# Patient Record
Sex: Male | Born: 1950
Health system: Southern US, Community
[De-identification: ages and names within clinical notes are randomized; demographics above are authoritative.]

## PROBLEM LIST (undated history)

## (undated) DIAGNOSIS — R569 Unspecified convulsions: Secondary | ICD-10-CM

## (undated) DIAGNOSIS — R55 Syncope and collapse: Secondary | ICD-10-CM

## (undated) DIAGNOSIS — I519 Heart disease, unspecified: Secondary | ICD-10-CM

## (undated) DIAGNOSIS — M21379 Foot drop, unspecified foot: Secondary | ICD-10-CM

## (undated) DIAGNOSIS — E785 Hyperlipidemia, unspecified: Secondary | ICD-10-CM

## (undated) DIAGNOSIS — S129XXA Fracture of neck, unspecified, initial encounter: Secondary | ICD-10-CM

## (undated) DIAGNOSIS — K759 Inflammatory liver disease, unspecified: Secondary | ICD-10-CM

## (undated) DIAGNOSIS — E119 Type 2 diabetes mellitus without complications: Secondary | ICD-10-CM

## (undated) DIAGNOSIS — R42 Dizziness and giddiness: Secondary | ICD-10-CM

## (undated) DIAGNOSIS — G4730 Sleep apnea, unspecified: Secondary | ICD-10-CM

## (undated) DIAGNOSIS — I219 Acute myocardial infarction, unspecified: Secondary | ICD-10-CM

## (undated) DIAGNOSIS — I10 Essential (primary) hypertension: Secondary | ICD-10-CM

## (undated) DIAGNOSIS — I2510 Atherosclerotic heart disease of native coronary artery without angina pectoris: Secondary | ICD-10-CM

## (undated) DIAGNOSIS — I6522 Occlusion and stenosis of left carotid artery: Secondary | ICD-10-CM

## (undated) DIAGNOSIS — A879 Viral meningitis, unspecified: Secondary | ICD-10-CM

## (undated) HISTORY — DX: Essential (primary) hypertension: I10

## (undated) HISTORY — DX: Heart disease, unspecified: I51.9

## (undated) HISTORY — DX: Inflammatory liver disease, unspecified: K75.9

## (undated) HISTORY — DX: Acute myocardial infarction, unspecified: I21.9

## (undated) HISTORY — PX: CARDIAC SURGERY: SHX584

## (undated) HISTORY — PX: CORONARY ANGIOPLASTY: SHX604

## (undated) HISTORY — PX: COLONOSCOPY: SHX174

---

## 1983-05-11 HISTORY — PX: HERNIA REPAIR: SHX51

## 2004-10-05 ENCOUNTER — Emergency Department: Payer: Self-pay | Admitting: Emergency Medicine

## 2004-10-09 ENCOUNTER — Emergency Department: Payer: Self-pay | Admitting: Emergency Medicine

## 2004-10-09 ENCOUNTER — Ambulatory Visit: Payer: Self-pay | Admitting: Unknown Physician Specialty

## 2004-10-28 ENCOUNTER — Encounter: Payer: Self-pay | Admitting: Physical Medicine and Rehabilitation

## 2004-11-07 ENCOUNTER — Encounter: Payer: Self-pay | Admitting: Physical Medicine and Rehabilitation

## 2004-12-08 ENCOUNTER — Encounter: Payer: Self-pay | Admitting: Physical Medicine and Rehabilitation

## 2005-01-08 ENCOUNTER — Encounter: Payer: Self-pay | Admitting: Physical Medicine and Rehabilitation

## 2005-02-07 ENCOUNTER — Encounter: Payer: Self-pay | Admitting: Physical Medicine and Rehabilitation

## 2006-05-10 DIAGNOSIS — S129XXA Fracture of neck, unspecified, initial encounter: Secondary | ICD-10-CM

## 2006-05-10 HISTORY — DX: Fracture of neck, unspecified, initial encounter: S12.9XXA

## 2011-05-09 ENCOUNTER — Emergency Department: Payer: Self-pay | Admitting: Emergency Medicine

## 2013-05-10 DIAGNOSIS — I219 Acute myocardial infarction, unspecified: Secondary | ICD-10-CM

## 2013-05-10 HISTORY — DX: Acute myocardial infarction, unspecified: I21.9

## 2013-05-10 HISTORY — PX: CORONARY ARTERY BYPASS GRAFT: SHX141

## 2013-08-18 LAB — CBC
HCT: 42.6 % (ref 40.0–52.0)
HGB: 14.2 g/dL (ref 13.0–18.0)
MCH: 29.7 pg (ref 26.0–34.0)
MCHC: 33.3 g/dL (ref 32.0–36.0)
MCV: 89 fL (ref 80–100)
Platelet: 175 10*3/uL (ref 150–440)
RBC: 4.78 10*6/uL (ref 4.40–5.90)
RDW: 13.9 % (ref 11.5–14.5)
WBC: 10.4 10*3/uL (ref 3.8–10.6)

## 2013-08-18 LAB — BASIC METABOLIC PANEL
Anion Gap: 7 (ref 7–16)
BUN: 14 mg/dL (ref 7–18)
CHLORIDE: 100 mmol/L (ref 98–107)
CO2: 26 mmol/L (ref 21–32)
Calcium, Total: 8.7 mg/dL (ref 8.5–10.1)
Creatinine: 0.92 mg/dL (ref 0.60–1.30)
EGFR (African American): >60
GLUCOSE: 118 mg/dL — AB (ref 65–99)
OSMOLALITY: 268 (ref 275–301)
Potassium: 3.6 mmol/L (ref 3.5–5.1)
Sodium: 133 mmol/L — ABNORMAL LOW (ref 136–145)

## 2013-08-18 LAB — PROTIME-INR
INR: 1.1
Prothrombin Time: 14.4 secs (ref 11.5–14.7)

## 2013-08-18 LAB — TROPONIN I: Troponin-I: 0.08 ng/mL — ABNORMAL HIGH

## 2013-08-18 LAB — PRO B NATRIURETIC PEPTIDE: B-Type Natriuretic Peptide: 27 pg/mL (ref 0–125)

## 2013-08-19 ENCOUNTER — Inpatient Hospital Stay: Payer: Self-pay | Admitting: Student

## 2013-08-19 LAB — CK TOTAL AND CKMB (NOT AT ARMC)
CK, TOTAL: 194 U/L
CK, Total: 207 U/L
CK, Total: 191 U/L
CK-MB: 11.2 ng/mL — ABNORMAL HIGH (ref 0.5–3.6)
CK-MB: 10.4 ng/mL — ABNORMAL HIGH (ref 0.5–3.6)
CK-MB: 9.8 ng/mL — ABNORMAL HIGH (ref 0.5–3.6)

## 2013-08-19 LAB — TROPONIN I
TROPONIN-I: 0.78 ng/mL — AB
TROPONIN-I: 1.5 ng/mL — AB
Troponin-I: 1.6 ng/mL — ABNORMAL HIGH
Troponin-I: 1.5 ng/mL — ABNORMAL HIGH
Troponin-I: 1.4 ng/mL — ABNORMAL HIGH

## 2013-08-19 LAB — PLATELET COUNT: Platelet: 162 10*3/uL (ref 150–440)

## 2013-08-19 LAB — APTT
ACTIVATED PTT: 77 s — AB (ref 23.6–35.9)
Activated PTT: 58.7 secs — ABNORMAL HIGH (ref 23.6–35.9)
Activated PTT: 62.8 secs — ABNORMAL HIGH (ref 23.6–35.9)

## 2013-08-19 LAB — CK-MB
CK-MB: 5.7 ng/mL — ABNORMAL HIGH (ref 0.5–3.6)
CK-MB: 8.9 ng/mL — ABNORMAL HIGH (ref 0.5–3.6)
CK-MB: 11 ng/mL — AB (ref 0.5–3.6)

## 2013-08-20 DIAGNOSIS — I252 Old myocardial infarction: Secondary | ICD-10-CM | POA: Insufficient documentation

## 2013-08-20 LAB — LIPID PANEL
CHOLESTEROL: 168 mg/dL (ref 0–200)
HDL Cholesterol: 46 mg/dL (ref 40–60)
Ldl Cholesterol, Calc: 102 mg/dL — ABNORMAL HIGH (ref 0–100)
Triglycerides: 102 mg/dL (ref 0–200)
VLDL CHOLESTEROL, CALC: 20 mg/dL (ref 5–40)

## 2013-08-20 LAB — CBC WITH DIFFERENTIAL/PLATELET
BASOS ABS: 0 10*3/uL (ref 0.0–0.1)
BASOS PCT: 0.2 %
EOS PCT: 4.8 %
Eosinophil #: 0.4 10*3/uL (ref 0.0–0.7)
HCT: 39.1 % — ABNORMAL LOW (ref 40.0–52.0)
HGB: 13.4 g/dL (ref 13.0–18.0)
Lymphocyte #: 2.3 10*3/uL (ref 1.0–3.6)
Lymphocyte %: 28.9 %
MCH: 30.7 pg (ref 26.0–34.0)
MCHC: 34.4 g/dL (ref 32.0–36.0)
MCV: 89 fL (ref 80–100)
MONO ABS: 0.7 x10 3/mm (ref 0.2–1.0)
Monocyte %: 9 %
Neutrophil #: 4.6 10*3/uL (ref 1.4–6.5)
Neutrophil %: 57.1 %
Platelet: 148 10*3/uL — ABNORMAL LOW (ref 150–440)
RBC: 4.39 10*6/uL — ABNORMAL LOW (ref 4.40–5.90)
RDW: 14.3 % (ref 11.5–14.5)
WBC: 8.1 10*3/uL (ref 3.8–10.6)

## 2013-08-20 LAB — BASIC METABOLIC PANEL
Anion Gap: 6 — ABNORMAL LOW (ref 7–16)
BUN: 19 mg/dL — AB (ref 7–18)
Calcium, Total: 8.5 mg/dL (ref 8.5–10.1)
Chloride: 104 mmol/L (ref 98–107)
Co2: 26 mmol/L (ref 21–32)
Creatinine: 0.86 mg/dL (ref 0.60–1.30)
EGFR (African American): >60
Glucose: 112 mg/dL — ABNORMAL HIGH (ref 65–99)
OSMOLALITY: 275 (ref 275–301)
Potassium: 4.1 mmol/L (ref 3.5–5.1)
Sodium: 136 mmol/L (ref 136–145)

## 2013-08-20 LAB — APTT: Activated PTT: 62.2 secs — ABNORMAL HIGH (ref 23.6–35.9)

## 2013-08-21 LAB — BASIC METABOLIC PANEL
Anion Gap: 5 — ABNORMAL LOW (ref 7–16)
BUN: 14 mg/dL (ref 7–18)
CALCIUM: 8.2 mg/dL — AB (ref 8.5–10.1)
CO2: 27 mmol/L (ref 21–32)
Chloride: 103 mmol/L (ref 98–107)
Creatinine: 0.92 mg/dL (ref 0.60–1.30)
EGFR (African American): >60
Glucose: 118 mg/dL — ABNORMAL HIGH (ref 65–99)
Osmolality: 272 (ref 275–301)
Potassium: 3.8 mmol/L (ref 3.5–5.1)
Sodium: 135 mmol/L — ABNORMAL LOW (ref 136–145)

## 2013-08-21 LAB — CK TOTAL AND CKMB (NOT AT ARMC)
CK, TOTAL: 122 U/L
CK-MB: 3 ng/mL (ref 0.5–3.6)

## 2013-09-10 DIAGNOSIS — Z9861 Coronary angioplasty status: Secondary | ICD-10-CM | POA: Insufficient documentation

## 2013-09-10 DIAGNOSIS — Z955 Presence of coronary angioplasty implant and graft: Secondary | ICD-10-CM | POA: Insufficient documentation

## 2013-09-22 ENCOUNTER — Observation Stay: Payer: Self-pay | Admitting: Internal Medicine

## 2013-09-22 LAB — CBC
HCT: 41 % (ref 40.0–52.0)
HGB: 13.7 g/dL (ref 13.0–18.0)
MCH: 30.5 pg (ref 26.0–34.0)
MCHC: 33.5 g/dL (ref 32.0–36.0)
MCV: 91 fL (ref 80–100)
PLATELETS: 146 10*3/uL — AB (ref 150–440)
RBC: 4.5 10*6/uL (ref 4.40–5.90)
RDW: 14.5 % (ref 11.5–14.5)
WBC: 7 10*3/uL (ref 3.8–10.6)

## 2013-09-22 LAB — COMPREHENSIVE METABOLIC PANEL
ALBUMIN: 3.6 g/dL (ref 3.4–5.0)
ANION GAP: 8 (ref 7–16)
AST: 29 U/L (ref 15–37)
Alkaline Phosphatase: 101 U/L
BUN: 16 mg/dL (ref 7–18)
Bilirubin,Total: 0.2 mg/dL (ref 0.2–1.0)
CHLORIDE: 106 mmol/L (ref 98–107)
Calcium, Total: 8.5 mg/dL (ref 8.5–10.1)
Co2: 25 mmol/L (ref 21–32)
Creatinine: 0.91 mg/dL (ref 0.60–1.30)
EGFR (Non-African Amer.): >60
GLUCOSE: 162 mg/dL — AB (ref 65–99)
Osmolality: 282 (ref 275–301)
Potassium: 3.8 mmol/L (ref 3.5–5.1)
SGPT (ALT): 32 U/L (ref 12–78)
SODIUM: 139 mmol/L (ref 136–145)
Total Protein: 7.3 g/dL (ref 6.4–8.2)

## 2013-09-22 LAB — TROPONIN I
TROPONIN-I: 0.02 ng/mL
Troponin-I: 0.07 ng/mL — ABNORMAL HIGH

## 2013-09-22 LAB — CK-MB
CK-MB: 3.9 ng/mL — ABNORMAL HIGH (ref 0.5–3.6)
CK-MB: 3.5 ng/mL (ref 0.5–3.6)

## 2013-09-22 LAB — PRO B NATRIURETIC PEPTIDE: B-TYPE NATIURETIC PEPTID: 91 pg/mL (ref 0–125)

## 2013-09-22 LAB — PROTIME-INR
INR: 1.1
Prothrombin Time: 14.3 secs (ref 11.5–14.7)

## 2013-09-22 LAB — PHENYTOIN LEVEL, TOTAL: DILANTIN: 17.4 ug/mL (ref 10.0–20.0)

## 2013-09-23 LAB — CK-MB
CK-MB: 3.2 ng/mL (ref 0.5–3.6)
CK-MB: 3.3 ng/mL (ref 0.5–3.6)
CK-MB: 3.3 ng/mL (ref 0.5–3.6)

## 2013-09-23 LAB — TROPONIN I
TROPONIN-I: 0.22 ng/mL — AB
Troponin-I: 0.2 ng/mL — ABNORMAL HIGH
Troponin-I: 0.3 ng/mL — ABNORMAL HIGH

## 2013-09-23 LAB — LIPID PANEL
Cholesterol: 117 mg/dL (ref 0–200)
HDL: 49 mg/dL (ref 40–60)
LDL CHOLESTEROL, CALC: 55 mg/dL (ref 0–100)
Triglycerides: 64 mg/dL (ref 0–200)
VLDL CHOLESTEROL, CALC: 13 mg/dL (ref 5–40)

## 2013-11-07 ENCOUNTER — Telehealth: Payer: Self-pay | Admitting: Cardiovascular Disease

## 2013-11-07 NOTE — Telephone Encounter (Signed)
Informed patient that we were calling for his wife to do TCM call

## 2013-11-07 NOTE — Telephone Encounter (Signed)
Pt got call from office & didn't know why. Only knew doctor Graciela Husbands was his doctor.

## 2014-01-02 ENCOUNTER — Ambulatory Visit: Payer: Self-pay | Admitting: Internal Medicine

## 2014-08-31 NOTE — Consult Note (Signed)
PATIENT NAME:  Edward Thompson, Edward Thompson MR#:  [id number] DATE OF BIRTH:  09-Sep-1950  DATE OF CONSULTATION:  08/19/2013  CONSULTING PHYSICIAN:  Laurier Nancy, MD  INDICATION FOR CONSULTATION: Chest pain , elevated troponin with non-STEMI.   This a 64 year old white male with a past medical history of seizure disorder presented to the Emergency Room with chest pain. The chest pain was described as pressure-type associated with shortness of breath. He had an elevated troponin, thus I was asked to evaluate the patient because the patient had changes also on EKG. He denies any chest pain at this time.   PAST MEDICAL HISTORY: History of seizure disorder. No history of coronary artery disease or heart disease in the past.   PAST SURGICAL HISTORY: He had a hernia repair and neck surgery.   ALLERGIES: None.   MEDICATIONS: Phenytoin and phenobarbital.   SOCIAL HISTORY: Denies EtOH abuse or smoking.   FAMILY HISTORY: Positive for coronary artery disease. His father had massive heart attack at age 70.   PHYSICAL EXAMINATION: GENERAL: He is alert, oriented x 3, in no acute distress.  VITALS: Stable.  NECK: No JVD.  LUNGS: Clear.  HEART: Regular rate and rhythm. Normal S1, S2. No audible murmur.  ABDOMEN: Soft, nontender, positive bowel sounds.  EXTREMITIES: No pedal edema.  NEUROLOGIC: The patient appears to be intact.   LABORATORIES AND STUDIES: EKG shows normal sinus rhythm, 92 beats per minute, old inferior wall myocardial infarction, nonspecific ST-T changes. His labs shows initial troponin of 0.08, BUN 14, creatinine 0.92. Second troponin is 0.78. Third troponin is 1.5.   ASSESSMENT AND PLAN: The patient has non-STEMI with consistently elevated and rising troponin levels with changes in the EKG suggestive of old inferior wall or recent inferior wall myocardial infarction. The patient already classic [place] class 3 anginal type of chest pain, thus the patient is having non-STEMI and will be set up  for cardiac catheterization. In the meantime, the patient was started on heparin, aspirin, metoprolol, nitroglycerin and it has relieved the patient's chest pain. We will also get an echocardiogram. Thank you very much for the referral.   ____________________________ Laurier Nancy, MD sak:sg D: 08/19/2013 10:26:27 ET T: 08/19/2013 11:52:54 ET JOB#: [id number]  cc: Laurier Nancy, MD, <Dictator> Laurier Nancy MD ELECTRONICALLY SIGNED 09/14/2013 11:03

## 2014-08-31 NOTE — H&P (Signed)
PATIENT NAME:  Edward Thompson, SPAUR MR#:  [id number] DATE OF BIRTH:  1950-10-02  DATE OF ADMISSION:  09/22/2013  REFERRING PHYSICIAN: Dr. Lowella Fairy   PRIMARY CARE PHYSICIAN:  Dr. Verne Grain at the [facility], [facility]  CHIEF COMPLAINT: Chest pain.   HISTORY OF PRESENT ILLNESS: Mr. Edward Thompson is a 64 year old male with history of seizures, with recent admission on 06/21/2013 for non-ST elevation MI, and underwent left heart cath. Was found to have an 80% lesion to LAD and a 90% to right PDA. Underwent stent placement. The patient states has been compliant with his medication. This evening around 5:00 p.m. while at rest, started to experience pain on the left side of the chest, pressure-like pain, 8-10/10  intensity. No radiation. Waited for 15 minutes and took a nitroglycerin, with some improvement. Called EMS, and EMS recommended him to take four 81 mg of baby aspirin, with significant improvement with the pain. The patient denied having any nausea, vomiting lightheadedness, shortness of breath. The patient states this pain is similar to the pain that he experienced in the past. Workup in the Emergency Department with EKG and cardiac enzymes was negative. The patient currently has very mild pain.   PAST MEDICAL HISTORY: 1.  Seizure disorder.  2.  Coronary artery disease, status post stent placement.  3.  Hyperlipidemia.   SURGICAL HISTORY: Hernia repair and neck surgery.    ALLERGIES: No known drug allergies.   HOME MEDICATIONS: 1.  Crestor 40 mg daily.  2.  Phenytoin 300 mg daily.  3.  Phenobarbital 2 tablets once a day.  4.  Nitroglycerin 0.4 mg sublingual every 5 minutes as needed.  5.  Metoprolol 25 mg 2 times a day.  6.  Imdur  30 mg 1 tablet once a day.  7.  Aspirin 81 mg daily.   SOCIAL HISTORY: No history of smoking, drinking alcohol, or using illicit drugs. Married, lives with his wife.   FAMILY HISTORY: Father died of a massive heart attack at the age of 67.   REVIEW  OF SYSTEMS: CONSTITUTIONAL: Denies any generalized weakness, weight loss or gain.  EYES:  No change in vision. No double vision. ENT: No tinnitus, ear pain, sore throat.  RESPIRATORY: No cough, shortness of breath.  CARDIOVASCULAR: Reports chest pain.  GASTROINTESTINAL:  Denies any nausea, vomiting, abdominal pain.  GENITOURINARY: Denies any dysuria or hematuria.  ENDOCRINE: No polyuria or polydipsia.  HEMATOLOGIC: No easy bruising or bleeding.  SKIN: No rash or lesions.  MUSCULOSKELETAL: No joint pains and aches.  NEUROLOGIC: No weakness or numbness in any part of the body. Has history of seizure disorder.  PSYCHIATRIC:  No anxiety or depression.   PHYSICAL EXAMINATION: GENERAL:  This is a well-built, well-nourished, age-appropriate male lying down in the bed, not in distress.  VITAL SIGNS: Temperature 98, pulse 79, blood pressure 126/62, respiratory rate of 16, oxygen saturation 97% on 2 liters of oxygen.  HEENT: Head normocephalic, atraumatic. There is no scleral icterus. Conjunctivae normal. Pupils equal and react to light. Extraocular movements are intact. Mucous membranes moist. No pharyngeal erythema.  NECK: Supple. No lymphadenopathy. No JVD. No carotid bruit. No thyromegaly.  CHEST: Has no focal tenderness.  LUNGS: Bilaterally clear to auscultation.  HEART: S1, S2 regular. No murmurs are heard. No pedal edema. Pulses 2+.  ABDOMEN:  Bowel sounds present. Soft, nontender, nondistended. Obese abdomen. Could not appreciate any hepatosplenomegaly.  SKIN: No rash or lesions.  MUSCULOSKELETAL: Good range of motion in all the extremities.  NEUROLOGIC: The patient is alert, oriented to place, person and time. Cranial nerves II to XII intact. Motor 5/5 in upper and lower extremities.   LABS: Chest x-ray, one view portable: No acute cardiopulmonary disease. BNP is 91. CBC and CMP are completely within normal limits. Initial set of cardiac enzymes 0.02. Coag profile is well within normal  limits.   EKG, 12-lead: Normal sinus rhythm with no ST-T wave abnormalities.   ASSESSMENT AND PLAN: Mr. Edward Thompson is a 64 year old male with a recent diagnosis of coronary artery disease requiring 2 stent placement to LAD and PDA, comes to the Emergency Department with complaints of chest pain.   1.  Chest pain. Considering the patient's risk factors, admit under observation. The patient's initial workup is negative. Continue to follow up with cardiac enzymes x 3. If negative, the patient could be discharged home. Continue with Plavix, metoprolol, statin, and lisinopril.   2. Hyperlipidemia. The patient is on 2 statins, simvastatin and Crestor. Discontinuing simvastatin. Continue with the Crestor. Will repeat the lipid profile.   3.  Seizure disorder. Continue with the home medications of phenytoin and phenobarbital. The patient denies having any recent seizures.   4.  Keep the patient on deep vein thrombosis prophylaxis with Lovenox.   TIME SPENT: 45 minutes.    ____________________________ Susa Griffins, MD pv:mr D: 09/22/2013 21:16:49 ET T: 09/22/2013 21:59:36 ET JOB#: [id number]  cc: Susa Griffins, MD, <Dictator> DR. Verne Grain, [facility], [place] Clerance Lav Sir Mallis MD ELECTRONICALLY SIGNED 10/05/2013 0:40

## 2014-08-31 NOTE — Discharge Summary (Signed)
PATIENT NAME:  Edward Thompson, Edward Thompson MR#:  [id number] DATE OF BIRTH:  1950/07/18  DATE OF ADMISSION:  08/19/2013 DATE OF DISCHARGE:  08/21/2013   CONSULTANTS: Dr. Adrian Blackwater from cardiology.   PRIMARY CARE PHYSICIAN: At [facility].   CHIEF COMPLAINT: Chest pain.   DISCHARGE DIAGNOSES:  1. Non-ST elevation myocardial infarction, requiring 2 stents.  2. Seizure disorder.  3. Hyperlipidemia.  4. Coronary artery disease.   DISCHARGE MEDICATIONS:  1. Clopidogrel 75 mg daily.  2. Aspirin 81 mg daily.  3. Metoprolol 25 mg 2 times a day.  4. Nitroglycerin 0.4 mg sublingual 1 tab every 5 minutes as needed for angina or hypertension. 5. Simvastatin 20 mg daily.  6. Phenobarbital 64.8 mg 2 tabs once a day. 7. Phenytoin 100 mg 3 caps once a day at bedtime extended-release.   DIET: Low sodium, low fat, low cholesterol.   ACTIVITY: As tolerated.   FOLLOWUP: Please follow with PCP within 1 to 2 weeks. Please follow on Friday with Dr. Welton Flakes for your heart attack.   DISPOSITION: Home.   SIGNIFICANT LABORATORY DATA AND IMAGING: Initial troponin of 0.08, peak troponin of 1.6, last troponin of 1.4. Initial CK-MB of 5.7, peak CK-MB of 11.2, last CK-MB of 3. LDL of 102. BUN is 14, creatinine of 0.92. Cardiac catheterization done on April 13th showing EF of 55% with mid LAD lesion and distal LAD 80% tubular stenosis, first diagonal 50% stenosis, right PDA 90% stenosis.   HISTORY OF PRESENT ILLNESS AND HOSPITAL COURSE: For full details of H and P, please see the dictation on April 12th by Dr. Randol Kern, but briefly, this is a 64 year old with a history of seizure disorder, who came in for chest pain while he was loading some groceries in the car. He came into the hospital, and en route, he had received some sublingual nitroglycerin, with improvement of his chest pain. He did bump his troponin from 0.08 higher and was started on aspirin, heparin drip, beta blocker, and the patient was seen by cardiology. He underwent a  cardiac catheter on the 13th of April showing significant CAD with 80% LAD lesion as well as distal PDA lesions. He did require a drug-eluting stent, and he will be discharged home today. He is chest pain free. His LDL was elevated above goal in the setting of MI and CAD, and he will be discharged on a statin and beta blocker. While hospitalized, his seizure medications were continued.   PHYSICAL EXAMINATION:  VITAL SIGNS: On the day of discharge, temperature is 99.4, pulse rate 86, respiratory rate 19, blood pressure 125/75, oxygen saturation 93%.  GENERAL: The patient is a well-developed obese male sitting in bed.  HEENT: Normocephalic, atraumatic.  LUNGS: Clear.  ABDOMEN: The patient has no significant abdominal tenderness.  CARDIOVASCULAR: Normal S1, S2 and no significant pitting edema.   TOTAL TIME SPENT: About 35 minutes.   ____________________________ Krystal Eaton, MD sa:lb D: 08/21/2013 11:48:48 ET T: 08/21/2013 12:26:17 ET JOB#: [id number]  cc: Krystal Eaton, MD, <Dictator> Clark R. Marilu Favre, MD, [facility], [facility] MD ELECTRONICALLY SIGNED 09/06/2013 14:25

## 2014-08-31 NOTE — H&P (Signed)
PATIENT NAME:  Edward Thompson, Edward Thompson MR#:  [id number] DATE OF BIRTH:  01-05-1951  DATE OF ADMISSION:  08/18/2013  REFERRING PHYSICIAN:  Dr. Shaune Pollack.   PRIMARY CARE PHYSICIAN:  Dr. Tyson Dense at [facility] at [facility].   CHIEF COMPLAINT:  Chest pain.   HISTORY OF PRESENT ILLNESS:  This is a 64 year old with significant past medical history of seizures, without any significant cardiac history, the patient presents with chest pain that started yesterday evening, the patient reports he was in the grocery store loading some groceries into the car when he started to have some chest pain, mid sternal, pressure quality, radiating to the right arm, he denies any nausea, vomiting, sweating, shortness of breath or dizziness, reports chest pain resolved with rest, but reoccured  during the day, so he called EMS, but the patient was given sublingual nitro by EMS which improved his chest pain, as well, reports he was given 324 mg of by mouth aspirin by them, upon presentation to the ED, the patient's chest pain has already much improved, his EKG did show Q wave in the inferior leads, as mentioned earlier the patient denies any previous cardiac history, the patient's first troponin was elevated at 0.08, the patient was started on heparin drip and nitro paste, currently reports he is chest pain-free, hospitalists were requested to admit the patient, the patient's blood work did not show any significant abnormalities besides mild hypokalemia at 3.6, the patient's most recent EKG was in 2012 which did show Q waves as well, hospitalist service requested to admit the patient for treatment for his non-STEMI.   PAST MEDICAL HISTORY:  Seizures.   PAST SURGICAL HISTORY:  Hernia repair and neck surgery.   ALLERGIES:  No known drug allergies.   HOME MEDICATIONS: 1.  Phenytoin 300 mg at bedtime.  2.  Phenobarbital 64.8 mg oral 2 tablets at bedtime.   SOCIAL HISTORY:  The patient denies any smoking, any alcohol, any  illicit drug use.   FAMILY HISTORY:  The patient reports his father died of massive heart attack at the age of 55.   REVIEW OF SYSTEMS:  CONSTITUTIONAL:  The patient denies fever, chills, fatigue, weakness, weight gain, weight loss.  EYES:  Denies blurry vision, double vision, inflammation, glaucoma.  EARS, NOSE, THROAT:  Denies tinnitus, ear pain, hearing loss, epistaxis or discharge.  RESPIRATORY:  Denies cough, wheezing, hemoptysis, painful respiratory. CARDIOVASCULAR:  Reports chest pain, currently resolved.  Denies edema, palpitations, syncope.  GASTROINTESTINAL:  Denies nausea, vomiting, diarrhea, abdominal pain, hematemesis.  GENITOURINARY:  Denies dysuria, hematuria, renal colic.  ENDOCRINE:  Denies polyuria, polydipsia, heat or cold intolerance.  HEMATOLOGY:  Denies anemia, easy bruising, bleeding diathesis.  INTEGUMENT:  Denies acne, rash or skin lesion.  MUSCULOSKELETAL:  Denies any arthritis, cramps, swelling, gout.  NEUROLOGIC:  Denies CVA, TIA, headache, ataxia, vertigo, tremor.  PSYCHIATRIC:  Denies anxiety, insomnia or depression.   PHYSICAL EXAMINATION: VITAL SIGNS:  Temperature 98.9, pulse 88, respiratory rate 16, blood pressure 110/74, saturating 95% on oxygen.  GENERAL:  Obese male who looks comfortable in bed in no apparent distress.  HEENT:  Head atraumatic, normocephalic.  Pupils equal, reactive to light.  Pink conjunctivae.  Anicteric sclerae.  Moist oral mucosa.  NECK:  Supple.  No thyromegaly.  No JVD.  CHEST:  Good air entry bilaterally.  No wheezing, rales, rhonchi.  CARDIOVASCULAR:  S1, S2 heard.  No rubs, murmurs, gallops.  ABDOMEN:  Obese, soft, nontender, nondistended.  Bowel sounds present.  EXTREMITIES:  No edema.  No clubbing.  No cyanosis.  PSYCHIATRIC:  Appropriate affect.  Awake, alert x 3.  Intact judgment and insight.  NEUROLOGIC:  Cranial nerves grossly intact.  Motor five out of five.  No focal deficits.  SKIN:  Normal skin turgor.  Warm and  dry.  MUSCULOSKELETAL:  No joint effusion or erythema.  LYMPHATICS:  No cervical lymphadenopathy could be appreciated.   PERTINENT LABORATORY DATA:  Glucose 118, BNP 27, BUN 14, creatinine 0.92, sodium 133, potassium 3.6, chloride 100, CO2 26.  Troponin 0.08.  White blood cells 10.4, hemoglobin 14.2, hematocrit 42.6, platelets 175.  INR 1.1.  Portable chest x-ray showing no acute cardiopulmonary process.  EKG showing normal sinus rhythm at 92 beats per minute with Q waves in the inferior leads.   ASSESSMENT AND PLAN: 1.  Non-ST-elevation myocardial infarction, the patient presents with typical cardiac chest pain resolved with nitroglycerin, had elevated troponins, the patient will be admitted to telemetry unit for treatment for non-ST-elevation myocardial infarction, he will be started on heparin drip, he already received aspirin, we will continue him on aspirin, we will add beta blockers, we will keep him on nitro paste, we will add as needed sublingual nitroglycerin as well and as needed morphine, we will consult cardiology service as well.  As well, we will check lipid panel and if needed we will start him on statin.  2.  History of seizures.  We will continue the patient on his home medication including phenobarbital and phenytoin.  3.  Deep vein thrombosis prophylaxis.  The patient is on full dose anticoagulation.  4.  CODE STATUS:  THE PATIENT IS A FULL CODE.   Total time spent on admission and patient care 45 minutes.     ____________________________ Starleen Arms, MD dse:ea D: 08/19/2013 01:15:49 ET T: 08/19/2013 03:09:03 ET JOB#: [id number]  cc: Starleen Arms, MD, <Dictator> Njeri Vicente [name] MD ELECTRONICALLY SIGNED 08/21/2013 2:46

## 2014-08-31 NOTE — Consult Note (Signed)
PATIENT NAME:  Edward Thompson, Edward Thompson MR#:  [id number] DATE OF BIRTH:  1951/02/05  DATE OF CONSULTATION:  09/23/2013  REFERRING PHYSICIAN:  Dr. Heron Nay CONSULTING PHYSICIAN:  Dwayne D. Juliann Pares, MD  PRIMARY CARE PHYSICIAN: Dr. Roby Lofts at [facility]  INDICATION: Chest pain, possible angina with known coronary disease.  HISTORY OF PRESENT ILLNESS: The patient is a 64 year old male with history of seizures, recent admission back in February with non-ST elevation myocardial infarction, having underwent cardiac catheter. The patient is found to have disease in his LAD and the RCA, underwent stent placement. The patient states he is being compliant with his medications. I do not see Plavix or  on his list,but evidently the patient started having chest pain about 5:00 p.m. the day of admission while at rest, mostly left sided, pressure sensation,  8/10 with no radiation, waited about 15 minutes,  took a nitroglycerin with minimal improvement,  EMS was given four 81 mg aspirin, had some mild improvement in his symptoms. Denied any nausea, vomiting, lightheadedness or shortness of breath. EKG and enzymes was negative. Pain improved, now presents for cardiac evaluation.   PAST MEDICAL HISTORY: Seizure disorder, coronary disease,  hyperlipidemia.   PAST SURGICAL HISTORY: Hernia repair, neck surgery,   MEDICATIONS: Crestor 40 a day, Dilantin 300 daily, phenobarbital   once a day, nitroglycerin sublingual p.r.n., metoprolol 25 twice a day, Imdur 30 once a day, aspirin 81 mg a day.   SOCIAL HISTORY: No smoking. No alcohol consumption, married, lives with his wife.   FAMILY HISTORY: Father had a myocardial infarction at 51.   REVIEW OF SYSTEMS: Denied blackout spells, syncope. No nausea   no blood red blood per rectum or vomiting. No fever, no chills. No sweats. No weight loss, no weight gain   but has some  chest pain symptoms.    PHYSICAL EXAMINATION: VITAL SIGNS: Blood pressure 130/60, pulse of  80, respiratory rate 14.  HEENT: Normocephalic, atraumatic. Pupils reactive to light.  NECK: Supple, no JVD-bruit  LUNGS:CTAP HEART:rr/r   gallop, rub. ABDOMEN: wnl  LABORATORIES: Chest x-ray is unremarkable.    91, CBC, CMP all , cardiac enzymes  . Coags were normal. EKG: Normal sinus rhythm, nonspecific ST-T wave changes.   ASSESSMENT:  1. Chest pain, with possible angina.  2. htn 3. Coronary artery disease. 4. abnormalekg 6. Status post stent placement.  7 Seizure  PLAN:  1. Agree with admit, rule out for myocardial infarction. Follow up cardiac enzymes, which are borderline. Followup   ,  nitrates as necessary. May increase Imdur   continue aspirin.  Also, the patient should be on Plavix or > if he had a stent placed in February it is not clear to me why he is not  . 2. For hyperlipidemia, continue  statin therapy.     simvastatin and Crestor and they discontinued simvastatin. We will follow up lipid profile and make adjustments according to the lipids and liver studies.  3. Seizure disorder. Continue Dilantin, phenobarbital and follow up levels, and follow-up with neurology for further recommendation of therapy.  4. Hypertension. He is metoprolol.  We will continue current  . twice a day. Continue Imdur or increase his . He may benefit from low dose ace inhibitor as well.  5. Again, status post stent placement. We will need to review records to see what sort of stent and where it was placed. Usually the patient should be on Plavix or   or Effient for at least 6 to  12 months. According to his records he is not on any of those right now, it is not clear why. We will try to make adjustments . I think conservative medical therapy is indicated at this point since he ruled out and pain symptoms are improved. Again,  would only consider increasing metoprolol and Imdur and adding    and then have the patient follow up with Dr. Welton Flakes as an outpatient for either a functional study or repeat cardiac  catheterization. I do not believe that any of that is necessary .  at this point. Again,   as to why he is not on Plavix    ____________________________ Bobbie Stack. Juliann Pares, MD ddc:sg D: 09/23/2013 11:20:00 ET T: 09/23/2013 13:00:22 ET JOB#: [id number]  cc: Dwayne D. Juliann Pares, MD, <Dictator> Alwyn Pea MD ELECTRONICALLY SIGNED 10/29/2013 0:54

## 2014-08-31 NOTE — Discharge Summary (Signed)
Dates of Admission and Diagnosis:  Date of Admission 22-Sep-2013   Date of Discharge 23-Sep-2013   Admitting Diagnosis chest pain   Final Diagnosis 1. Chronic angina 2. CAD 3. HTN    Chief Complaint/History of Present Illness CC- chesst pain.   HISTORY OF PRESENT ILLNESS: Edward Thompson is a 64 year old male with history of seizures, with recent admission on 06/21/2013 for non-ST elevation MI, and underwent left heart cath. Was found to have an 80% lesion to LAD and a 90% to right PDA. Underwent stent placement. The patient states has been compliant with his medication. This evening around 5:00 p.m. while at rest, started to experience pain on the left side of the chest, pressure-like pain, 8-10/10  intensity. No radiation. Waited for 15 minutes and took a nitroglycerin, with some improvement. Called EMS, and EMS recommended him to take four 81 mg of baby aspirin, with significant improvement with the pain. The patient denied having any nausea, vomiting lightheadedness, shortness of breath. The patient states this pain is similar to the pain that he experienced in the past. Workup in the Emergency Department with EKG and cardiac enzymes was negative. The patient currently has very mild pain.   Allergies:  No Known Allergies:   Hepatic:  16-May-15 18:32   Bilirubin, Total 0.2  Alkaline Phosphatase 101 (45-117 NOTE: New Reference Range 03/30/13)  SGPT (ALT) 32  SGOT (AST) 29  Total Protein, Serum 7.3  Albumin, Serum 3.6  TDMs:  16-May-15 18:32   Dilantin, Serum 17.4 (Result(s) reported on 22 Sep 2013 at 06:58PM.)  Routine Chem:  16-May-15 18:32   Glucose, Serum  162  BUN 16  Creatinine (comp) 0.91  Sodium, Serum 139  Potassium, Serum 3.8  Chloride, Serum 106  CO2, Serum 25  Calcium (Total), Serum 8.5  Osmolality (calc) 282  eGFR (African American) >60  eGFR (Non-African American) >60 (eGFR values <75m/min/1.73 m2 may be an indication of chronic kidney disease (CKD). Calculated  eGFR is useful in patients with stable renal function. The eGFR calculation will not be reliable in acutely ill patients when serum creatinine is changing rapidly. It is not useful in  patients on dialysis. The eGFR calculation may not be applicable to patients at the low and high extremes of body sizes, pregnant women, and vegetarians.)  Anion Gap 8  B-Type Natriuretic Peptide ([facility]) 91 (Result(s) reported on 22 Sep 2013 at 06:58PM.)    21:17   Result Comment TROPONIN - RESULTS VERIFIED BY REPEAT TESTING.  - C/JANA GRIGNDHEIM/2200/09-22-13/[facility]  - READ-BACK PROCESS PERFORMED.  Result(s) reported on 22 Sep 2013 at 10:05PM.  17-May-15 01:32   Result Comment TROPONIN - RESULTS VERIFIED BY REPEAT TESTING.  - ELEVATED TROPONIN PREVIOUSLY CALLED AT  - 2200 09/22/13.[facility]  Result(s) reported on 23 Sep 2013 at 02:26AM.  Cholesterol, Serum 117  Triglycerides, Serum 64  HDL (INHOUSE) 49  VLDL Cholesterol Calculated 13  LDL Cholesterol Calculated 55 (Result(s) reported on 23 Sep 2013 at 02:11AM.)    08:01   Result Comment TROPONIN - RESULTS VERIFIED BY REPEAT TESTING.  - RESULT CALLED @2200  ON 09/22/13/[facility]  Result(s) reported on 23 Sep 2013 at 08:54AM.    13:59   Result Comment TROPONIN - RESULTS VERIFIED BY REPEAT TESTING.  - RESULT CALLED PREVIOUSLY 09/22/13  - AT 2200 BY [facility]  Result(s) reported on 23 Sep 2013 at 03:23PM.  Cardiac:  16-May-15 18:32   Troponin I 0.02 (0.00-0.05 0.05 ng/mL or less: NEGATIVE  Repeat testing in 3-6 hrs  if clinically  indicated. >0.05 ng/mL: POTENTIAL  MYOCARDIAL INJURY. Repeat  testing in 3-6 hrs if  clinically indicated. NOTE: An increase or decrease  of 30% or more on serial  testing suggests a  clinically important change)  CPK-MB, Serum  3.9 (Result(s) reported on 22 Sep 2013 at 11:12PM.)    21:17   Troponin I  0.07 (0.00-0.05 0.05 ng/mL or less: NEGATIVE  Repeat testing in 3-6 hrs  if clinically indicated. >0.05 ng/mL: POTENTIAL   MYOCARDIAL INJURY. Repeat  testing in 3-6 hrs if  clinically indicated. NOTE: An increase or decrease  of 30% or more on serial  testing suggests a  clinically important change)  CPK-MB, Serum 3.5 (Result(s) reported on 22 Sep 2013 at 11:12PM.)  17-May-15 01:32   Troponin I  0.20 (0.00-0.05 0.05 ng/mL or less: NEGATIVE  Repeat testing in 3-6 hrs  if clinically indicated. >0.05 ng/mL: POTENTIAL  MYOCARDIAL INJURY. Repeat  testing in 3-6 hrs if  clinically indicated. NOTE: An increase or decrease  of 30% or more on serial  testing suggests a  clinically important change)  CPK-MB, Serum 3.2 (Result(s) reported on 23 Sep 2013 at 02:20AM.)    08:01   Troponin I  0.30 (0.00-0.05 0.05 ng/mL or less: NEGATIVE  Repeat testing in 3-6 hrs  if clinically indicated. >0.05 ng/mL: POTENTIAL  MYOCARDIAL INJURY. Repeat  testing in 3-6 hrs if  clinically indicated. NOTE: An increase or decrease  of 30% or more on serial  testing suggests a  clinically important change)  CPK-MB, Serum 3.3 (Result(s) reported on 23 Sep 2013 at 09:52AM.)    12:19   CPK-MB, Serum 3.3 (Result(s) reported on 23 Sep 2013 at 12:47PM.)    13:59   Troponin I  0.22 (0.00-0.05 0.05 ng/mL or less: NEGATIVE  Repeat testing in 3-6 hrs  if clinically indicated. >0.05 ng/mL: POTENTIAL  MYOCARDIAL INJURY. Repeat  testing in 3-6 hrs if  clinically indicated. NOTE: An increase or decrease  of 30% or more on serial  testing suggests a  clinically important change)  Routine Coag:  16-May-15 18:32   Prothrombin 14.3  INR 1.1 (INR reference interval applies to patients on anticoagulant therapy. A single INR therapeutic range for coumarins is not optimal for all indications; however, the suggested range for most indications is 2.0 - 3.0. Exceptions to the INR Reference Range may include: Prosthetic heart valves, acute myocardial infarction, prevention of myocardial infarction, and combinations of aspirin and  anticoagulant. The need for a higher or lower target INR must be assessed individually. Reference: The Pharmacology and Management of the Vitamin K  antagonists: the seventh ACCP Conference on Antithrombotic and Thrombolytic Therapy. TMAUQ.3335 Sept:126 (3suppl): [id number]. A HCT value >55% may artifactually increase the PT.  In one study,  the increase was an average of 25%. Reference:  "Effect on Routine and Special Coagulation Testing Values of Citrate Anticoagulant Adjustment in Patients with High HCT Values." American Journal of Clinical Pathology 2006;126:400-405.)  Routine Hem:  16-May-15 18:32   WBC (CBC) 7.0  RBC (CBC) 4.50  Hemoglobin (CBC) 13.7  Hematocrit (CBC) 41.0  Platelet Count (CBC)  146 (Result(s) reported on 22 Sep 2013 at 06:49PM.)  MCV 91  MCH 30.5  MCHC 33.5  RDW 14.5   Pertinent Past History:  Pertinent Past History PAST MEDICAL HISTORY: 1.  Seizure disorder.  2.  Coronary artery disease, status post stent placement.  3.  Hyperlipidemia.   Hospital Course:  Hospital Course Admitted fro chest pain. Had recent cath. troponin  did go up mildly but trended down prior to d/c. Pts chest pain had resolved in ED. Discussed case with Dr. Clayborn Bigness who has advised that pt f/u with his cardiologist Dr. Chancy Milroy in 2-3 days. As per his recommendations pt will be discharged home. he is CP free. Vitals stable. Ambulated without problems. EKG showed nothing acute ASA, Statin, BB, Nitro   Condition on Discharge Fair   Code Status:  Code Status Full Code   DISCHARGE INSTRUCTIONS HOME MEDS:  Medication Reconciliation: Patient's Home Medications at Discharge:     Medication Instructions  nitroglycerin 0.4 mg sublingual tablet  1 tab(s) sublingual every 5 minutes, As Needed, angina, hypertension   metoprolol 25 mg oral tablet  1 tab(s) orally 2 times a day   phenytoin 100 mg oral capsule, extended release  3 cap(s) orally once a day   phenobarbital 64.8 mg oral tablet   2 tab(s) orally once a day (at bedtime)   crestor 40 mg oral tablet  1 tab(s) orally once a day (at bedtime)   isosorbide mononitrate extended release 30 mg oral tablet, extended release  1 tab(s) orally once a day (in the morning)   aspirin enteric coated 325 mg oral delayed release tablet  1 tab(s) orally once a day    STOP TAKING THE FOLLOWING MEDICATION(S):    simvastatin 20 mg oral tablet: 1 tab(s) orally once a day (at bedtime)  Physician's Instructions:  Diet Low Sodium  Low Fat, Low Cholesterol   Activity Limitations As tolerated   Return to Work Not Applicable   Time frame for Follow Up Appointment 1-2 days  Dr. Humphrey Rolls - cardiology   Time frame for Follow Up Appointment 1-2 weeks  PCP   Electronic Signatures: Edward Thompson, Edward Thompson (MD)  (Signed 19-May-15 11:32)  Authored: ADMISSION DATE AND DIAGNOSIS, CHIEF COMPLAINT/HPI, Allergies, PERTINENT LABS, PERTINENT PAST HISTORY, HOSPITAL COURSE, [facility] MED[facility], PATIENT INSTRUCTIONS   Last Updated: 19-May-15 11:32 by Edward Thompson (MD)

## 2016-02-05 ENCOUNTER — Emergency Department: Payer: Medicare Other

## 2016-02-05 ENCOUNTER — Encounter: Payer: Self-pay | Admitting: Emergency Medicine

## 2016-02-05 ENCOUNTER — Observation Stay
Admission: EM | Admit: 2016-02-05 | Discharge: 2016-02-07 | Disposition: A | Discharge status: Home / Self Care | Payer: Medicare Other | Attending: Internal Medicine | Admitting: Internal Medicine

## 2016-02-05 DIAGNOSIS — E1142 Type 2 diabetes mellitus with diabetic polyneuropathy: Secondary | ICD-10-CM | POA: Diagnosis not present

## 2016-02-05 DIAGNOSIS — E1165 Type 2 diabetes mellitus with hyperglycemia: Secondary | ICD-10-CM | POA: Diagnosis not present

## 2016-02-05 DIAGNOSIS — E860 Dehydration: Secondary | ICD-10-CM | POA: Insufficient documentation

## 2016-02-05 DIAGNOSIS — E1110 Type 2 diabetes mellitus with ketoacidosis without coma: Secondary | ICD-10-CM

## 2016-02-05 DIAGNOSIS — G40909 Epilepsy, unspecified, not intractable, without status epilepticus: Secondary | ICD-10-CM | POA: Diagnosis not present

## 2016-02-05 DIAGNOSIS — I252 Old myocardial infarction: Secondary | ICD-10-CM | POA: Diagnosis not present

## 2016-02-05 DIAGNOSIS — I2510 Atherosclerotic heart disease of native coronary artery without angina pectoris: Secondary | ICD-10-CM | POA: Diagnosis not present

## 2016-02-05 DIAGNOSIS — I6522 Occlusion and stenosis of left carotid artery: Secondary | ICD-10-CM | POA: Diagnosis not present

## 2016-02-05 DIAGNOSIS — Z9181 History of falling: Secondary | ICD-10-CM | POA: Diagnosis not present

## 2016-02-05 DIAGNOSIS — Z7982 Long term (current) use of aspirin: Secondary | ICD-10-CM | POA: Diagnosis not present

## 2016-02-05 DIAGNOSIS — R42 Dizziness and giddiness: Principal | ICD-10-CM | POA: Insufficient documentation

## 2016-02-05 DIAGNOSIS — I10 Essential (primary) hypertension: Secondary | ICD-10-CM | POA: Insufficient documentation

## 2016-02-05 DIAGNOSIS — R2681 Unsteadiness on feet: Secondary | ICD-10-CM | POA: Insufficient documentation

## 2016-02-05 DIAGNOSIS — E871 Hypo-osmolality and hyponatremia: Secondary | ICD-10-CM | POA: Diagnosis not present

## 2016-02-05 DIAGNOSIS — Z7902 Long term (current) use of antithrombotics/antiplatelets: Secondary | ICD-10-CM | POA: Insufficient documentation

## 2016-02-05 DIAGNOSIS — Z79899 Other long term (current) drug therapy: Secondary | ICD-10-CM | POA: Insufficient documentation

## 2016-02-05 DIAGNOSIS — M6281 Muscle weakness (generalized): Secondary | ICD-10-CM

## 2016-02-05 HISTORY — DX: Atherosclerotic heart disease of native coronary artery without angina pectoris: I25.10

## 2016-02-05 HISTORY — DX: Type 2 diabetes mellitus without complications: E11.9

## 2016-02-05 HISTORY — DX: Unspecified convulsions: R56.9

## 2016-02-05 HISTORY — DX: Acute myocardial infarction, unspecified: I21.9

## 2016-02-05 HISTORY — DX: Dizziness and giddiness: R42

## 2016-02-05 HISTORY — DX: Viral meningitis, unspecified: A87.9

## 2016-02-05 LAB — URINE DRUG SCREEN, QUALITATIVE (ARMC ONLY)
AMPHETAMINES, UR SCREEN: NOT DETECTED
BARBITURATES, UR SCREEN: POSITIVE — AB
BENZODIAZEPINE, UR SCRN: NOT DETECTED
Cannabinoid 50 Ng, Ur ~~LOC~~: NOT DETECTED
Cocaine Metabolite,Ur ~~LOC~~: NOT DETECTED
MDMA (Ecstasy)Ur Screen: NOT DETECTED
METHADONE SCREEN, URINE: NOT DETECTED
Opiate, Ur Screen: NOT DETECTED
PHENCYCLIDINE (PCP) UR S: NOT DETECTED
Tricyclic, Ur Screen: NOT DETECTED

## 2016-02-05 LAB — COMPREHENSIVE METABOLIC PANEL
ALT: 18 U/L (ref 17–63)
ANION GAP: 10 (ref 5–15)
AST: 16 U/L (ref 15–41)
Albumin: 4.1 g/dL (ref 3.5–5.0)
Alkaline Phosphatase: 123 U/L (ref 38–126)
BILIRUBIN TOTAL: 0.5 mg/dL (ref 0.3–1.2)
BUN: 13 mg/dL (ref 6–20)
CO2: 23 mmol/L (ref 22–32)
Calcium: 9.2 mg/dL (ref 8.9–10.3)
Chloride: 100 mmol/L — ABNORMAL LOW (ref 101–111)
Creatinine, Ser: 0.75 mg/dL (ref 0.61–1.24)
GFR calc Af Amer: >60 mL/min (ref >60)
Glucose, Bld: 357 mg/dL — ABNORMAL HIGH (ref 65–99)
POTASSIUM: 4 mmol/L (ref 3.5–5.1)
Sodium: 133 mmol/L — ABNORMAL LOW (ref 135–145)
TOTAL PROTEIN: 7.9 g/dL (ref 6.5–8.1)

## 2016-02-05 LAB — URINALYSIS COMPLETE WITH MICROSCOPIC (ARMC ONLY)
BILIRUBIN URINE: NEGATIVE
Bacteria, UA: NONE SEEN
Glucose, UA: >500 mg/dL — AB
Hgb urine dipstick: NEGATIVE
KETONES UR: NEGATIVE mg/dL
Leukocytes, UA: NEGATIVE
Nitrite: NEGATIVE
PROTEIN: NEGATIVE mg/dL
SPECIFIC GRAVITY, URINE: 1.032 — AB (ref 1.005–1.030)
Squamous Epithelial / LPF: NONE SEEN
pH: 5 (ref 5.0–8.0)

## 2016-02-05 LAB — DIFFERENTIAL
Basophils Absolute: 0 10*3/uL (ref 0–0.1)
Basophils Relative: 0 %
EOS ABS: 0.3 10*3/uL (ref 0–0.7)
EOS PCT: 4 %
LYMPHS ABS: 2 10*3/uL (ref 1.0–3.6)
Lymphocytes Relative: 30 %
MONOS PCT: 8 %
Monocytes Absolute: 0.5 10*3/uL (ref 0.2–1.0)
Neutro Abs: 3.7 10*3/uL (ref 1.4–6.5)
Neutrophils Relative %: 58 %

## 2016-02-05 LAB — TROPONIN I

## 2016-02-05 LAB — CBC
HEMATOCRIT: 39.9 % — AB (ref 40.0–52.0)
HEMOGLOBIN: 14.2 g/dL (ref 13.0–18.0)
MCH: 30.5 pg (ref 26.0–34.0)
MCHC: 35.6 g/dL (ref 32.0–36.0)
MCV: 85.9 fL (ref 80.0–100.0)
Platelets: 170 10*3/uL (ref 150–440)
RBC: 4.64 MIL/uL (ref 4.40–5.90)
RDW: 14.1 % (ref 11.5–14.5)
WBC: 6.5 10*3/uL (ref 3.8–10.6)

## 2016-02-05 LAB — PROTIME-INR
INR: 1.08
Prothrombin Time: 14 seconds (ref 11.4–15.2)

## 2016-02-05 LAB — APTT: aPTT: 29 seconds (ref 24–36)

## 2016-02-05 LAB — ETHANOL: Alcohol, Ethyl (B): <5 mg/dL (ref <5)

## 2016-02-05 LAB — GLUCOSE, CAPILLARY
GLUCOSE-CAPILLARY: 370 mg/dL — AB (ref 65–99)
Glucose-Capillary: 327 mg/dL — ABNORMAL HIGH (ref 65–99)

## 2016-02-05 MED ORDER — PHENOBARBITAL 32.4 MG PO TABS
129.6000 mg | ORAL_TABLET | Freq: Every day | ORAL | Status: DC
  Administered 2016-02-05 – 2016-02-06 (×2): 129.6 mg via ORAL
  Filled 2016-02-05 (×2): qty 4

## 2016-02-05 MED ORDER — INSULIN ASPART 100 UNIT/ML ~~LOC~~ SOLN
0.0000 [IU] | Freq: Three times a day (TID) | SUBCUTANEOUS | Status: DC
  Administered 2016-02-06: 17:00:00 5 [IU] via SUBCUTANEOUS
  Administered 2016-02-06: 08:00:00 3 [IU] via SUBCUTANEOUS
  Administered 2016-02-06 – 2016-02-07 (×3): 5 [IU] via SUBCUTANEOUS
  Filled 2016-02-05 (×4): qty 5
  Filled 2016-02-05: qty 3

## 2016-02-05 MED ORDER — MECLIZINE HCL 25 MG PO TABS
25.0000 mg | ORAL_TABLET | Freq: Three times a day (TID) | ORAL | Status: DC
  Administered 2016-02-05 – 2016-02-07 (×6): 25 mg via ORAL
  Filled 2016-02-05 (×6): qty 1

## 2016-02-05 MED ORDER — ONDANSETRON 4 MG PO TBDP: 4.0000 mg | ORAL_TABLET | Freq: Three times a day (TID) | ORAL | 0 refills | Status: DC | PRN

## 2016-02-05 MED ORDER — CLOPIDOGREL BISULFATE 75 MG PO TABS
75.0000 mg | ORAL_TABLET | Freq: Every day | ORAL | Status: DC
  Administered 2016-02-05 – 2016-02-07 (×3): 75 mg via ORAL
  Filled 2016-02-05 (×3): qty 1

## 2016-02-05 MED ORDER — PHENYTOIN SODIUM EXTENDED 100 MG PO CAPS
300.0000 mg | ORAL_CAPSULE | Freq: Every day | ORAL | Status: DC
  Administered 2016-02-05 – 2016-02-06 (×2): 300 mg via ORAL
  Filled 2016-02-05 (×2): qty 3

## 2016-02-05 MED ORDER — HYDRALAZINE HCL 20 MG/ML IJ SOLN: 10.0000 mg | Freq: Four times a day (QID) | INTRAMUSCULAR | Status: DC | PRN

## 2016-02-05 MED ORDER — SODIUM CHLORIDE 0.9 % IV SOLN: 250.0000 mL | INTRAVENOUS | Status: DC | PRN

## 2016-02-05 MED ORDER — PANTOPRAZOLE SODIUM 40 MG PO TBEC
40.0000 mg | DELAYED_RELEASE_TABLET | Freq: Every day | ORAL | Status: DC
  Administered 2016-02-06 – 2016-02-07 (×2): 40 mg via ORAL
  Filled 2016-02-05 (×2): qty 1

## 2016-02-05 MED ORDER — ENOXAPARIN SODIUM 40 MG/0.4ML ~~LOC~~ SOLN
40.0000 mg | SUBCUTANEOUS | Status: DC
  Administered 2016-02-05 – 2016-02-06 (×2): 40 mg via SUBCUTANEOUS
  Filled 2016-02-05 (×2): qty 0.4

## 2016-02-05 MED ORDER — SODIUM CHLORIDE 0.9% FLUSH: 3.0000 mL | INTRAVENOUS | Status: DC | PRN

## 2016-02-05 MED ORDER — METOPROLOL TARTRATE 25 MG PO TABS
25.0000 mg | ORAL_TABLET | Freq: Two times a day (BID) | ORAL | Status: DC
  Administered 2016-02-05 – 2016-02-07 (×4): 25 mg via ORAL
  Filled 2016-02-05 (×4): qty 1

## 2016-02-05 MED ORDER — ACETAMINOPHEN 650 MG RE SUPP: 650.0000 mg | Freq: Four times a day (QID) | RECTAL | Status: DC | PRN

## 2016-02-05 MED ORDER — ONDANSETRON HCL 4 MG/2ML IJ SOLN: 4.0000 mg | Freq: Four times a day (QID) | INTRAMUSCULAR | Status: DC | PRN

## 2016-02-05 MED ORDER — ASPIRIN EC 81 MG PO TBEC
81.0000 mg | DELAYED_RELEASE_TABLET | Freq: Every day | ORAL | Status: DC
  Administered 2016-02-06 – 2016-02-07 (×2): 81 mg via ORAL
  Filled 2016-02-05 (×2): qty 1

## 2016-02-05 MED ORDER — ONDANSETRON HCL 4 MG PO TABS: 4.0000 mg | ORAL_TABLET | Freq: Four times a day (QID) | ORAL | Status: DC | PRN

## 2016-02-05 MED ORDER — SODIUM CHLORIDE 0.9 % IV BOLUS (SEPSIS)
500.0000 mL | Freq: Once | INTRAVENOUS | Status: AC
  Administered 2016-02-05: 500 mL via INTRAVENOUS

## 2016-02-05 MED ORDER — ALBUTEROL SULFATE (2.5 MG/3ML) 0.083% IN NEBU: 2.5000 mg | INHALATION_SOLUTION | RESPIRATORY_TRACT | Status: DC | PRN

## 2016-02-05 MED ORDER — SODIUM CHLORIDE 0.9 % IV SOLN
INTRAVENOUS | Status: DC
Start: 2016-02-05 — End: 2016-02-06
  Administered 2016-02-05 – 2016-02-06 (×2): via INTRAVENOUS

## 2016-02-05 MED ORDER — ACETAMINOPHEN 325 MG PO TABS: 650.0000 mg | ORAL_TABLET | Freq: Four times a day (QID) | ORAL | Status: DC | PRN

## 2016-02-05 MED ORDER — SODIUM CHLORIDE 0.9% FLUSH
3.0000 mL | Freq: Two times a day (BID) | INTRAVENOUS | Status: DC
  Administered 2016-02-06 – 2016-02-07 (×3): 3 mL via INTRAVENOUS

## 2016-02-05 MED ORDER — INSULIN ASPART 100 UNIT/ML ~~LOC~~ SOLN
0.0000 [IU] | Freq: Every day | SUBCUTANEOUS | Status: DC
  Administered 2016-02-05: 23:00:00 5 [IU] via SUBCUTANEOUS
  Administered 2016-02-06: 21:00:00 3 [IU] via SUBCUTANEOUS
  Filled 2016-02-05: qty 3
  Filled 2016-02-05: qty 5

## 2016-02-05 MED ORDER — ATORVASTATIN CALCIUM 20 MG PO TABS
40.0000 mg | ORAL_TABLET | Freq: Every day | ORAL | Status: DC
  Administered 2016-02-05 – 2016-02-06 (×2): 40 mg via ORAL
  Filled 2016-02-05 (×2): qty 2

## 2016-02-05 MED ORDER — SODIUM CHLORIDE 0.9% FLUSH: 3.0000 mL | Freq: Two times a day (BID) | INTRAVENOUS | Status: DC

## 2016-02-05 NOTE — ED Notes (Signed)
Attempting to DC patient. Family states, "I would rather not take him home". Dr. Scotty Court notified and at bedside.

## 2016-02-05 NOTE — ED Notes (Signed)
Attempted to call report x 1  

## 2016-02-05 NOTE — ED Provider Notes (Addendum)
[facility] Emergency Department Provider Note  ____________________________________________  Time seen: Approximately 1:51 PM  I have reviewed the triage vital signs and the nursing notes.   HISTORY  Chief Complaint Dizziness    HPI Edward Thompson is a 65 y.o. male comes to the ED complaining of dizziness with standing. He is brought to the ED from work where he was noted to otherwise be in his usual state of health. The patient also reports that he feels like his right arm is weaker that started around the same time as the symptoms about 2 hours ago.denies any pain anywhere. No fevers chills shortness of breath or vomiting. Reports that he's been eating and drinking normally.     Past Medical History:  Diagnosis Date  . Seizures (HCC)    taking phenobarbitol and dilantin     There are no active problems to display for this patient.    History reviewed. No pertinent surgical history.   Prior to Admission medications   Medication Sig Start Date End Date Taking? Authorizing Provider  ondansetron (ZOFRAN ODT) 4 MG disintegrating tablet Take 1 tablet (4 mg total) by mouth every 8 (eight) hours as needed for nausea or vomiting. 02/05/16   Sharman Cheek, MD     Allergies Review of patient's allergies indicates no known allergies.   No family history on file.  Social History Social History  Substance Use Topics  . Smoking status: Never Smoker  . Smokeless tobacco: Never Used  . Alcohol use No    Review of Systems  Constitutional:   No fever or chills.  ENT:   No sore throat. No rhinorrhea. Cardiovascular:   No chest pain. Respiratory:   No dyspnea or cough. Gastrointestinal:   Negative for abdominal pain, vomiting and diarrhea.  Genitourinary:   Negative for dysuria or difficulty urinating. Musculoskeletal:   Negative for focal pain or swelling Neurological:   Negative for headaches. Positive dizziness. Positive right arm  weakness. 10-point ROS otherwise negative.  ____________________________________________   PHYSICAL EXAM:  VITAL SIGNS: ED Triage Vitals [02/05/16 1343]  Enc Vitals Group     BP (!) 151/85     Pulse Rate 71     Resp 18     Temp 98.1 F (36.7 C)     Temp Source Oral     SpO2 100 %     Weight 218 lb (98.9 kg)     Height 5\' 4"  (1.626 m)     Head Circumference      Peak Flow      Pain Score      Pain Loc      Pain Edu?      Excl. in GC?     Vital signs reviewed, nursing assessments reviewed.   Constitutional:   Alert and oriented. Well appearing and in no distress. Eyes:   No scleral icterus. No conjunctival pallor. PERRL. EOMI.  No nystagmus. ENT   Head:   Normocephalic and atraumatic.   Nose:   No congestion/rhinnorhea. No septal hematoma   Mouth/Throat:   MMM, no pharyngeal erythema. No peritonsillar mass.    Neck:   No stridor. No SubQ emphysema. No meningismus. Hematological/Lymphatic/Immunilogical:   No cervical lymphadenopathy. Cardiovascular:   RRR. Symmetric bilateral radial and DP pulses.  No murmurs.  Respiratory:   Normal respiratory effort without tachypnea nor retractions. Breath sounds are clear and equal bilaterally. No wheezes/rales/rhonchi. Gastrointestinal:   Soft and nontender. Non distended. There is no CVA tenderness.  No  rebound, rigidity, or guarding. Genitourinary:   deferred Musculoskeletal:   Nontender with normal range of motion in all extremities. No joint effusions.  No lower extremity tenderness.  No edema. Neurologic:   Normal speech and language.  CN 2-10 normal. Motor grossly intact. No pronator drift. Normal finger-nose-finger. Normal heel-to-shin. NIH stroke scale 0 No gross focal neurologic deficits are appreciated.  Skin:    Skin is warm, dry and intact. No rash noted.  No petechiae, purpura, or bullae.  ____________________________________________    LABS (pertinent positives/negatives) (all labs ordered are  listed, but only abnormal results are displayed) Labs Reviewed  GLUCOSE, CAPILLARY - Abnormal; Notable for the following:       Result Value   Glucose-Capillary 327 (*)    All other components within normal limits  CBC - Abnormal; Notable for the following:    HCT 39.9 (*)    All other components within normal limits  COMPREHENSIVE METABOLIC PANEL - Abnormal; Notable for the following:    Sodium 133 (*)    Chloride 100 (*)    Glucose, Bld 357 (*)    All other components within normal limits  URINALYSIS COMPLETEWITH MICROSCOPIC ([facility] ONLY) - Abnormal; Notable for the following:    Color, Urine STRAW (*)    APPearance CLEAR (*)    Glucose, UA >500 (*)    Specific Gravity, Urine 1.032 (*)    All other components within normal limits  ETHANOL  PROTIME-INR  APTT  DIFFERENTIAL  TROPONIN I  URINE DRUG SCREEN, QUALITATIVE ([facility] ONLY)   ____________________________________________   EKG  Interpreted by me Normal sinus rhythm rate of 75, normal axis and intervals. Normal QRS ST segments. T wave inversions in 3 and aVF, V3 and V4. Inferior T wave inversions are old, anterior T-wave inversions appear to be new compared to May 2015.  ____________________________________________    RADIOLOGY  Chest x-ray unremarkable  ____________________________________________   PROCEDURES Procedures  ____________________________________________   INITIAL IMPRESSION / ASSESSMENT AND PLAN / ED COURSE  Pertinent labs & imaging results that were available during my care of the patient were reviewed by me and considered in my medical decision making (see chart for details).  Patient well appearing no acute distress, presents with vague symptoms of orthostatic dizziness. No other acute symptoms. On review of systems, he does report some subjective weakness in the right arm, but this is not reproducible on examination has absolutely no neurologic deficits.Considering the patient's symptoms,  medical history, and physical examination today, I have low suspicion for ischemic stroke, intracranial hemorrhage, meningitis, encephalitis, carotid or vertebral dissection, venous sinus thrombosis, MS, intracranial hypertension, glaucoma, CRAO, CRVO, or temporal arteritis.    ----------------------------------------- 3:50 PM on 02/05/2016 -----------------------------------------  Daughter arrived at bedside, provides additional history that the patient was recently diagnosed last week with severe carotid artery stenosis by his cardiology clinic at Dr. [facility], in [facility]. He has been having progressively frequent falls because he gets dizzy every time he stands up and cannot even walk across the house without getting very dizzy and at risk for falls. He lives alone. Has a history of ankle fracture due to falls.    We'll try to obtain records from the clinic with the ultrasound result, but it appears he would also benefit from further in-hospital evaluation if he is symptomatic from a critical stenosis of his carotid artery. Cycling cardiac enzymes were also be beneficial with the potentially new T-wave inversions.  Clinical Course   ____________________________________________   FINAL  CLINICAL IMPRESSION(S) / ED DIAGNOSES  Final diagnoses:  Orthostatic dizziness  Mild dehydration       Portions of this note were generated with dragon dictation software. Dictation errors may occur despite best attempts at proofreading.    Sharman Cheek, MD 02/05/16 1518    Sharman Cheek, MD 02/05/16 [phone]

## 2016-02-05 NOTE — H&P (Addendum)
[facility] - [place] at [facility]   PATIENT NAME: Edward Thompson    MR#:  [id number]  DATE OF BIRTH:  Nov 11, 1950  DATE OF ADMISSION:  02/05/2016  PRIMARY CARE PHYSICIAN: Luna Fuse, MD   REQUESTING/REFERRING PHYSICIAN: Sharman Cheek, MD  CHIEF COMPLAINT:   Chief Complaint  Patient presents with  . Dizziness   Dizziness for 2 years, worsening for 1-2 weeks. HISTORY OF PRESENT ILLNESS:  Edward Thompson  is a 65 y.o. male with a known history of MI, CAD, vertigo and seizure disorder. He has had dizziness for 2 years, worsening for 1-2 weeks. Dizziness worsened with standing. He has had unsteady gait and fell multiple times at home recently. He got ankle fracture due to fall 2 weeks ago. He denies any headache, chest pain or palpitation, no weakness or numbness. He recently got carotid duplex in Dr. Milta Deiters office, which show carotid stenosis.  PAST MEDICAL HISTORY:   Past Medical History:  Diagnosis Date  . CAD (coronary artery disease)   . Heart attack (HCC)   . Seizures (HCC)    taking phenobarbitol and dilantin  . Vertigo   . Viral meningitis     PAST SURGICAL HISTORY:   Past Surgical History:  Procedure Laterality Date  . CARDIAC SURGERY    . CORONARY ANGIOPLASTY    . HERNIA REPAIR      SOCIAL HISTORY:   Social History  Substance Use Topics  . Smoking status: Never Smoker  . Smokeless tobacco: Never Used  . Alcohol use No    FAMILY HISTORY:   Family History  Problem Relation Age of Onset  . Heart disease Mother   . Heart attack Father     DRUG ALLERGIES:  No Known Allergies  REVIEW OF SYSTEMS:   Review of Systems  Constitutional: Positive for malaise/fatigue. Negative for chills and fever.  HENT: Negative for congestion.   Eyes: Negative for blurred vision and double vision.  Respiratory: Negative for cough, shortness of breath, wheezing and stridor.   Cardiovascular: Negative for chest pain, palpitations,  orthopnea and leg swelling.  Gastrointestinal: Negative for abdominal pain, blood in stool, melena, nausea and vomiting.  Genitourinary: Negative for dysuria, frequency, hematuria and urgency.  Musculoskeletal: Negative for joint pain.  Skin: Negative for itching and rash.  Neurological: Positive for dizziness. Negative for tingling, sensory change, speech change, focal weakness, seizures, loss of consciousness and headaches.  Psychiatric/Behavioral: Negative for depression. The patient is not nervous/anxious.     MEDICATIONS AT HOME:   Prior to Admission medications   Medication Sig Start Date End Date Taking? Authorizing Provider  aspirin EC 81 MG tablet Take 81 mg by mouth daily.   Yes Historical Provider, MD  clopidogrel (PLAVIX) 75 MG tablet 1 tablet. 01/28/15  Yes Historical Provider, MD  meclizine (ANTIVERT) 25 MG tablet Take 1 tablet by mouth 3 (three) times daily as needed. 05/27/14  Yes Historical Provider, MD  metoprolol tartrate (LOPRESSOR) 25 MG tablet Take 1 tablet by mouth 2 (two) times daily. 02/24/15  Yes Historical Provider, MD  pantoprazole (PROTONIX) 40 MG tablet Take 1 tablet by mouth daily. 09/24/15  Yes Historical Provider, MD  PHENobarbital (LUMINAL) 64.8 MG tablet Take 2 tablets by mouth at bedtime. 05/15/14  Yes Historical Provider, MD  phenytoin (DILANTIN) 100 MG ER capsule 3 capsules at bedtime. 01/28/15  Yes Historical Provider, MD  ondansetron (ZOFRAN ODT) 4 MG disintegrating tablet Take 1 tablet (4 mg total) by mouth every 8 (eight) hours  as needed for nausea or vomiting. 02/05/16   Sharman Cheek, MD      VITAL SIGNS:  Blood pressure 140/78, pulse 66, temperature 98.1 F (36.7 C), temperature source Oral, resp. rate 19, height 5\' 4"  (1.626 m), weight 218 lb (98.9 kg), SpO2 96 %.  PHYSICAL EXAMINATION:  Physical Exam  GENERAL:  65 y.o.-year-old patient lying in the bed with no acute distress.  EYES: Pupils equal, round, reactive to light and accommodation. No  scleral icterus. Extraocular muscles intact.  HEENT: Head atraumatic, normocephalic. Oropharynx and nasopharynx clear. Moist oral mucosa. NECK:  Supple, no jugular venous distention. No thyroid enlargement, no tenderness.  LUNGS: Normal breath sounds bilaterally, no wheezing, rales,rhonchi or crepitation. No use of accessory muscles of respiration.  CARDIOVASCULAR: S1, S2 normal. No murmurs, rubs, or gallops.  ABDOMEN: Soft, nontender, nondistended. Bowel sounds present. No organomegaly or mass.  EXTREMITIES: No pedal edema, cyanosis, or clubbing.  NEUROLOGIC: Cranial nerves II through XII are intact. Muscle strength 5/5 in all extremities. Sensation intact. Gait not checked.  PSYCHIATRIC: The patient is alert and oriented x 3.  SKIN: No obvious rash, lesion, or ulcer.   LABORATORY PANEL:   CBC  Recent Labs Lab 02/05/16 1346  WBC 6.5  HGB 14.2  HCT 39.9*  PLT 170   ------------------------------------------------------------------------------------------------------------------  Chemistries   Recent Labs Lab 02/05/16 1346  NA 133*  K 4.0  CL 100*  CO2 23  GLUCOSE 357*  BUN 13  CREATININE 0.75  CALCIUM 9.2  AST 16  ALT 18  ALKPHOS 123  BILITOT 0.5   ------------------------------------------------------------------------------------------------------------------  Cardiac Enzymes  Recent Labs Lab 02/05/16 1346  TROPONINI <0.03   ------------------------------------------------------------------------------------------------------------------  RADIOLOGY:  Dg Chest Portable 1 View  Result Date: 02/05/2016 CLINICAL DATA:  65 year old with acute onset of dizziness while at work earlier today, associated with right upper extremity weakness. EXAM: PORTABLE CHEST 1 VIEW COMPARISON:  09/22/2013, 08/18/2013. FINDINGS: Cardiac silhouette mildly to moderately enlarged for AP portable technique, unchanged. Thoracic aorta mildly atherosclerotic, unchanged. Hilar and  mediastinal contours otherwise unremarkable. Stable linear scarring at the left lung base. Lungs otherwise clear. Pulmonary vascularity normal. No visible pleural effusions. IMPRESSION: 1. Stable cardiomegaly. Stable scarring at the left lung base. No acute cardiopulmonary disease. 2. Thoracic aortic atherosclerosis. Electronically Signed   By: Hulan Saas M.D.   On: 02/05/2016 14:17   Ct Head Code Stroke W/o Cm  Result Date: 02/05/2016 CLINICAL DATA:  Code stroke. 65 year old male with sudden onset of dizziness and right upper extremity weakness. Initial encounter. EXAM: CT HEAD WITHOUT CONTRAST TECHNIQUE: Contiguous axial images were obtained from the base of the skull through the vertex without intravenous contrast. COMPARISON:  05/09/2011 CT. FINDINGS: Brain: No intracranial hemorrhage or CT evidence of large acute infarct. Moderate chronic microvascular changes. Streak artifact extends through the the posterior fossa. Mild global atrophy without hydrocephalus. Prominent calcification of the falx. No intracranial mass lesion noted on this unenhanced exam. Vascular: Prominent calcifications vertebral arteries, basilar artery and internal carotid arteries. There may be underlying significant stenosis. Skull: Negative. Sinuses/Orbits: Mucosal thickening right frontal sinus. Opacification right frontal ethmoidal junction. Minimal mucosal thickening anterior ethmoid sinus air cells. Minimal mucosal thickening right sphenoid sinus. Other: Negative. ASPECTS [facility] Stroke Program Early CT Score) - Ganglionic level infarction (caudate, lentiform nuclei, internal capsule, insula, M1-M3 cortex): 7 - Supraganglionic infarction (M4-M6 cortex): 3 Total score (0-10 with 10 being normal): 10 IMPRESSION: 1. No intracranial hemorrhage or CT evidence of large acute infarct. Moderate chronic microvascular changes.  Prominent calcifications vertebral arteries, basilar artery and internal carotid arteries. There may be  underlying significant stenosis. Mucosal thickening right frontal sinus. Opacification right frontal ethmoidal junction. Minimal mucosal thickening anterior ethmoid sinus air cells. 1. ASPECTS is 10 These results were called by telephone at the time of interpretation on 02/05/2016 at 2:53 pm to Dr. Sharman CheekPHILLIP STAFFORD , who verbally acknowledged these results. Electronically Signed   By: Lacy DuverneySteven  Olson M.D.   On: 02/05/2016 15:00      IMPRESSION AND PLAN:    Dizziness. Unclear etiology. Check orthostatic Vital sign. Give Antivert 3 times a day. Neurology consult.  Fall and unsteady gait. Fall precautions and PT evaluation.  Carotid stenosis. Unclear severity, we will try to get a report from Dr. Dr. Milta DeitersKhan's office. The patient may need CT angiogram of head. Continue aspirinand plavix, start statin. Check lipid.  Hyponatremia. Possible pseudohyponatremia due to hyperglycemia. Start normal saline IV and follow-up BMP.  Hyperglycemia. Check hemoglobin A1c and start sliding scale.  HTN. Continue lopressor bid and start iv hydralazine.  Seizure disorder. Continue Dilantin.   All the records are reviewed and case discussed with ED provider. Management plans discussed with the patient, family and they are in agreement.  CODE STATUS: Full code  TOTAL TIME TAKING CARE OF THIS PATIENT: 55 minutes.    Shaune Pollackhen, Jerusha Reising M.D on 02/05/2016 at 4:59 PM  Between 7am to 6pm - Pager - [phone]  After 6pm go to [email or web address] - Social research officer, governmentpassword EPAS [facility]  [facility] [place] [facility]  Office  [phone]  CC: Primary care physician; Luna FuseEJAN-SIE, SHEIKH AHMED, MD   Note: This dictation was prepared with Dragon dictation along with smaller phrase technology. Any transcriptional errors that result from this process are unintentional.

## 2016-02-05 NOTE — ED Triage Notes (Signed)
Pt comes into the ED via EMS from his job c/o dizziness when standing.  Patient has NIH of 0 upon assessment.  Denies chest pain, shortness of breath, but does c/o mild weakness on his right arm.  EKG unremarkable with stable VS.

## 2016-02-05 NOTE — ED Notes (Signed)
Attempted to call report x2

## 2016-02-05 NOTE — ED Notes (Signed)
Blood Glucose = 327

## 2016-02-06 ENCOUNTER — Observation Stay: Payer: Medicare Other

## 2016-02-06 DIAGNOSIS — R42 Dizziness and giddiness: Secondary | ICD-10-CM

## 2016-02-06 LAB — BASIC METABOLIC PANEL
Anion gap: 8 (ref 5–15)
BUN: 12 mg/dL (ref 6–20)
CALCIUM: 8.7 mg/dL — AB (ref 8.9–10.3)
CO2: 22 mmol/L (ref 22–32)
CREATININE: 0.59 mg/dL — AB (ref 0.61–1.24)
Chloride: 105 mmol/L (ref 101–111)
GFR calc non Af Amer: >60 mL/min (ref >60)
Glucose, Bld: 226 mg/dL — ABNORMAL HIGH (ref 65–99)
Potassium: 3.8 mmol/L (ref 3.5–5.1)
SODIUM: 135 mmol/L (ref 135–145)

## 2016-02-06 LAB — LIPID PANEL
CHOL/HDL RATIO: 3.5 ratio
CHOLESTEROL: 133 mg/dL (ref 0–200)
HDL: 38 mg/dL — AB (ref >40)
LDL Cholesterol: 67 mg/dL (ref 0–99)
Triglycerides: 138 mg/dL (ref <150)
VLDL: 28 mg/dL (ref 0–40)

## 2016-02-06 LAB — GLUCOSE, CAPILLARY
GLUCOSE-CAPILLARY: 275 mg/dL — AB (ref 65–99)
GLUCOSE-CAPILLARY: 261 mg/dL — AB (ref 65–99)
GLUCOSE-CAPILLARY: 259 mg/dL — AB (ref 65–99)
Glucose-Capillary: 242 mg/dL — ABNORMAL HIGH (ref 65–99)
Glucose-Capillary: 255 mg/dL — ABNORMAL HIGH (ref 65–99)

## 2016-02-06 LAB — PHENYTOIN LEVEL, TOTAL: PHENYTOIN LVL: 11.5 ug/mL (ref 10.0–20.0)

## 2016-02-06 MED ORDER — METFORMIN HCL 500 MG PO TABS
500.0000 mg | ORAL_TABLET | Freq: Two times a day (BID) | ORAL | Status: DC
  Administered 2016-02-06: 500 mg via ORAL
  Filled 2016-02-06: qty 1

## 2016-02-06 MED ORDER — LIVING WELL WITH DIABETES BOOK
Freq: Once | Status: DC
  Filled 2016-02-06: qty 1

## 2016-02-06 MED ORDER — LIVING WELL WITH DIABETES BOOK
Freq: Once | Status: AC
  Administered 2016-02-06: 13:00:00
  Filled 2016-02-06: qty 1

## 2016-02-06 MED ORDER — IOPAMIDOL (ISOVUE-370) INJECTION 76%
75.0000 mL | Freq: Once | INTRAVENOUS | Status: AC | PRN
  Administered 2016-02-06: 75 mL via INTRAVENOUS

## 2016-02-06 NOTE — Progress Notes (Signed)
[facility] - [place] at [facility]   PATIENT NAME: Edward Thompson    MR#:  [id number]  DATE OF BIRTH:  27-Mar-1951  SUBJECTIVE:   Came in with increasing dizziness for 1-2 years more for last few days REVIEW OF SYSTEMS:   Review of Systems  Constitutional: Negative for chills, fever and weight loss.  HENT: Negative for ear discharge, ear pain and nosebleeds.   Eyes: Negative for blurred vision, pain and discharge.  Respiratory: Negative for sputum production, shortness of breath, wheezing and stridor.   Cardiovascular: Negative for chest pain, palpitations, orthopnea and PND.  Gastrointestinal: Negative for abdominal pain, diarrhea, nausea and vomiting.  Genitourinary: Negative for frequency and urgency.  Musculoskeletal: Negative for back pain and joint pain.  Neurological: Positive for dizziness. Negative for sensory change, speech change, focal weakness and weakness.  Psychiatric/Behavioral: Negative for depression and hallucinations. The patient is not nervous/anxious.    Tolerating Diet:yes Tolerating PT: HHPT  DRUG ALLERGIES:  No Known Allergies  VITALS:  Blood pressure (!) 158/98, pulse 75, temperature 98.1 F (36.7 C), temperature source Oral, resp. rate 20, height 5\' 4"  (1.626 m), weight 97.6 kg (215 lb 1.6 oz), SpO2 96 %.  PHYSICAL EXAMINATION:   Physical Exam  GENERAL:  65 y.o.-year-old patient lying in the bed with no acute distress.  EYES: Pupils equal, round, reactive to light and accommodation. No scleral icterus. Extraocular muscles intact.  HEENT: Head atraumatic, normocephalic. Oropharynx and nasopharynx clear.  NECK:  Supple, no jugular venous distention. No thyroid enlargement, no tenderness.  LUNGS: Normal breath sounds bilaterally, no wheezing, rales, rhonchi. No use of accessory muscles of respiration.  CARDIOVASCULAR: S1, S2 normal. No murmurs, rubs, or gallops.  ABDOMEN: Soft, nontender, nondistended. Bowel sounds present. No  organomegaly or mass.  EXTREMITIES: No cyanosis, clubbing or edema b/l.    NEUROLOGIC: Cranial nerves II through XII are intact. No focal Motor or sensory deficits b/l.   PSYCHIATRIC:  patient is alert and oriented x 3.  SKIN: No obvious rash, lesion, or ulcer.   LABORATORY PANEL:  CBC  Recent Labs Lab 02/05/16 1346  WBC 6.5  HGB 14.2  HCT 39.9*  PLT 170    Chemistries   Recent Labs Lab 02/05/16 1346 02/06/16 0419  NA 133* 135  K 4.0 3.8  CL 100* 105  CO2 23 22  GLUCOSE 357* 226*  BUN 13 12  CREATININE 0.75 0.59*  CALCIUM 9.2 8.7*  AST 16  --   ALT 18  --   ALKPHOS 123  --   BILITOT 0.5  --    Cardiac Enzymes  Recent Labs Lab 02/05/16 1346  TROPONINI <0.03   RADIOLOGY:  Ct Angio Neck W Or Wo Contrast  Result Date: 02/06/2016 CLINICAL DATA:  Dizziness. EXAM: CT ANGIOGRAPHY NECK TECHNIQUE: Multidetector CT imaging of the neck was performed using the standard protocol during bolus administration of intravenous contrast. Multiplanar CT image reconstructions and MIPs were obtained to evaluate the vascular anatomy. Carotid stenosis measurements (when applicable) are obtained utilizing NASCET criteria, using the distal internal carotid diameter as the denominator. CONTRAST:  75 cc Isovue 370 intravenous COMPARISON:  None. FINDINGS: Aortic arch: Atherosclerotic calcification. No aneurysm or dissection. Three vessel branching. Right carotid system: Tortuous with proximal ICA extending to or just beyond the mandibular angle at the level of the carotid bifurcation. Atherosclerotic plaque mainly at the bifurcation with no narrowing greater than 50%. No dissection or beading. No visible plaque ulceration. Left carotid system: Prominent mixed  density plaque on the proximal ICA with up to 60% stenosis of the proximal ICA. The left ICA is smaller than the right, which could be related to a nonvisualized intracranial circulation. No dissection or ulceration. Vertebral arteries: No  proximal subclavian stenosis. Codominant vessels that are smooth and patent to the dura. Moderate left ostial stenosis. There is calcified atherosclerotic plaque on the bilateral V4 segments with up to mild right and moderate left V4 stenosis . Skeleton: No acute or aggressive finding Other neck: Negative Upper chest: Probable patchy atelectasis in the upper chest. IMPRESSION: 1. No acute finding. 2. Atherosclerosis with up to 60% proximal left ICA stenosis. No flow limiting stenosis in the right carotid. 3. Codominant vertebral arteries with moderate left ostial stenosis. Mild-to-moderate bilateral V4 atherosclerotic narrowing. Electronically Signed   By: Marnee Spring M.D.   On: 02/06/2016 12:19   Dg Chest Portable 1 View  Result Date: 02/05/2016 CLINICAL DATA:  65 year old with acute onset of dizziness while at work earlier today, associated with right upper extremity weakness. EXAM: PORTABLE CHEST 1 VIEW COMPARISON:  09/22/2013, 08/18/2013. FINDINGS: Cardiac silhouette mildly to moderately enlarged for AP portable technique, unchanged. Thoracic aorta mildly atherosclerotic, unchanged. Hilar and mediastinal contours otherwise unremarkable. Stable linear scarring at the left lung base. Lungs otherwise clear. Pulmonary vascularity normal. No visible pleural effusions. IMPRESSION: 1. Stable cardiomegaly. Stable scarring at the left lung base. No acute cardiopulmonary disease. 2. Thoracic aortic atherosclerosis. Electronically Signed   By: Hulan Saas M.D.   On: 02/05/2016 14:17   Ct Head Code Stroke W/o Cm  Result Date: 02/05/2016 CLINICAL DATA:  Code stroke. 65 year old male with sudden onset of dizziness and right upper extremity weakness. Initial encounter. EXAM: CT HEAD WITHOUT CONTRAST TECHNIQUE: Contiguous axial images were obtained from the base of the skull through the vertex without intravenous contrast. COMPARISON:  05/09/2011 CT. FINDINGS: Brain: No intracranial hemorrhage or CT evidence of  large acute infarct. Moderate chronic microvascular changes. Streak artifact extends through the the posterior fossa. Mild global atrophy without hydrocephalus. Prominent calcification of the falx. No intracranial mass lesion noted on this unenhanced exam. Vascular: Prominent calcifications vertebral arteries, basilar artery and internal carotid arteries. There may be underlying significant stenosis. Skull: Negative. Sinuses/Orbits: Mucosal thickening right frontal sinus. Opacification right frontal ethmoidal junction. Minimal mucosal thickening anterior ethmoid sinus air cells. Minimal mucosal thickening right sphenoid sinus. Other: Negative. ASPECTS [facility] Stroke Program Early CT Score) - Ganglionic level infarction (caudate, lentiform nuclei, internal capsule, insula, M1-M3 cortex): 7 - Supraganglionic infarction (M4-M6 cortex): 3 Total score (0-10 with 10 being normal): 10 IMPRESSION: 1. No intracranial hemorrhage or CT evidence of large acute infarct. Moderate chronic microvascular changes. Prominent calcifications vertebral arteries, basilar artery and internal carotid arteries. There may be underlying significant stenosis. Mucosal thickening right frontal sinus. Opacification right frontal ethmoidal junction. Minimal mucosal thickening anterior ethmoid sinus air cells. 1. ASPECTS is 10 These results were called by telephone at the time of interpretation on 02/05/2016 at 2:53 pm to Dr. Sharman Cheek , who verbally acknowledged these results. Electronically Signed   By: Lacy Duverney M.D.   On: 02/05/2016 15:00   ASSESSMENT AND PLAN:   Travone Strole  is a 66 y.o. male with a known history of MI, CAD, vertigo and seizure disorder. He has had dizziness for 2 years, worsening for 1-2 weeks. Dizziness worsened with standing. He has had unsteady gait and fell multiple times at home recently. He got ankle fracture due to fall 2 weeks ago  1. Dizziness. Unclear etiology. - orthostatic Vital sign  negative -prn Give Antivert 3 times a day. Neurology consult appreciated. MRI brain pending -CT angio neck shows 60% stenosis left ICA  2.Fall and unsteady gait. Fall precautions and PT evaluation noted-rec HHPT  3. Left ICA 60% Carotid stenosis. Continue aspirin and plavix, start statin.\ -outpt vascular consult  4.Hyponatremia. Possible pseudohyponatremia due to hyperglycemia. Start normal saline IV and follow-up BMP.  5. DM-2 new onset  Check hemoglobin A1c and start sliding scale. -started on metformin bid  6.HTN. Continue lopressor bid and  iv hydralazine prn  7.Seizure disorder. Continue Dilantin.   Case discussed with Care Management/Social Worker. Management plans discussed with the patient, family and they are in agreement.  CODE STATUS: full DVT Prophylaxis: lovenox TOTAL TIME TAKING CARE OF THIS PATIENT: 30 minutes.  >50% time spent on counselling and coordination of care  POSSIBLE D/C IN 1-2 DAYS, DEPENDING ON CLINICAL CONDITION.  Note: This dictation was prepared with Dragon dictation along with smaller phrase technology. Any transcriptional errors that result from this process are unintentional.  Davinder Haff M.D on 02/06/2016 at 2:45 PM  Between 7am to 6pm - Pager - [phone]  After 6pm go to [email or web address] - password [email or web address] [facility]  [place] [facility]  Office  [phone]  CC: Primary care physician; Luna Fuse, MD

## 2016-02-06 NOTE — Progress Notes (Addendum)
Spoke with patient about new diabetes diagnosis.    Education needs addressed: 1.  What an A1C and a CBG are and why important. 2.  Basic pathophysiology of DM Type 2 and basic home care 3.  Goals for A1C and CBG's (when to check, how to record) 4.  Signs and symptoms of hyperglycemia and hypoglycemia and treatment 5.  Impact of nutrition, exercise, stress, sickness, and medications on diabetes control.  6.  Ordered Living Well with diabetes booklet and encouraged patient to read 7.  Handout information on [employer] products  Patient verbalized understanding of information discussed and he states that he has no further questions at this time related to diabetes.   RNs to provide ongoing basic DM education at bedside with this patient and engage patient to actively check blood glucose.   Patient's wife died 14 months ago from complications of DM, so patient understands DM, how to manage, how to take CBG's, and how to identify hypo and hyperglycemia.  Note - chart shows no insurance, but patient states has Medicare (card sent home with daughter).  Thank you,  Kristine Linea, RN, BSN Diabetes Coordinator Inpatient Diabetes Program [phone] (Team Pager) [phone] ([facility] office) [phone] [facility] office) [phone] [facility] office)

## 2016-02-06 NOTE — Care Management (Addendum)
Admitted to [facility] with the diagnosis of dizziness under observation status. Lives alone. Daughter is Edward Thompson [phone]). Works at [employer] full time. States he signed up for Medicare in December 2016. Does not have insurance at [employer]. Uses a cane or rolling walker to aid in ambulation. Fell and fractured ankle a few weeks ago. Takes care of all basic and instrumental activities of daily living himself, drives. Prescriptions are filled per [facility] express scripts. Lost 10lbs weight in 2 months. No Home Health. No skilled facility. No home oxygen. Daughter will transport. Physical therapy evaluation completed. Recommending home with home health and physical therapy. Would like therapies in the home, if necessary.  E-Mail to [employer] in [place] for possible [employer]. Received reply. Mr. Edward Thompson has [facility] Gwenette Greet RN MSN CCM Care Management [phone]

## 2016-02-06 NOTE — Progress Notes (Signed)
  RD consulted for nutrition education regarding diabetes.   No results found for: HGBA1C  RD provided "Carbohydrate Counting for People with Diabetes" handout from the Academy of Nutrition and Dietetics. Discussed different food groups and their effects on blood sugar, emphasizing carbohydrate-containing foods. Provided list of carbohydrates and recommended serving sizes of common foods.  Discussed importance of controlled and consistent carbohydrate intake throughout the day. Provided examples of ways to balance meals/snacks and encouraged intake of high-fiber, whole grain complex carbohydrates. Teach back method used.  Expect fair compliance.  Body mass index is 36.92 kg/m. Pt meets criteria for obesity unspecified based on current BMI.  Current diet order is carb modified, patient is consuming approximately 75% of meals at this time. Labs and medications reviewed. No further nutrition interventions warranted at this time. RD contact information provided. If additional nutrition issues arise, please re-consult RD.  Atalya Dano B. Freida Busman, RD, LDN [phone] (pager) Weekend/On-Call pager [phone])

## 2016-02-06 NOTE — Evaluation (Signed)
Physical Therapy Evaluation Patient Details Name: Edward Thompson MRN: [id number] DOB: November 10, 1950 Today's Date: 02/06/2016   History of Present Illness  Edward Thompson  is a 65 y.o. male with a known history of MI, CAD, vertigo and seizure disorder. He has had dizziness for 2 years, worsening for 1-2 weeks. Dizziness worsened with standing. He has had unsteady gait and fell multiple times at home recently. He got ankle fracture due to fall 2 weeks ago. He denies any headache, chest pain or palpitation, no weakness or numbness. He recently got carotid duplex in Dr. Milta Deiters office, which show carotid stenosis. Pt reports >12 falls over the last couple weeks. Pt denies onset of symptoms with rolling in bed. Symptoms only occur when he stands up. Pt denies vertigo. Reports sensation of feeling unsteady "like I'm going to fall." Denies feeling lightheaded or fainting sensations. Denies chills, fevers, night sweats, diplopia, blurring of vision, otorrhea, otalgia, aural fullness, tinnitis, headaches, or migraines. Confirms 10# weight loss over the last 2 months. Upon further questioning pt reports that his dizziness is more "unsteadiness." Pt denies feeling vertigo or "swimmy headed."   Clinical Impression  Pt admitted with above diagnosis. Pt currently with functional limitations due to the deficits listed below (see PT Problem List). Pt denies chills, fevers, night sweats, diplopia, blurring of vision, otorrhea, otalgia, aural fullness, tinnitis, headaches, or migraines. Confirms 10# weight loss over the last 2 months. Upon further questioning pt reports that his dizziness is more "unsteadiness." Pt has abnormal finger to nose LUE and heel to shin LLE. Rapid alternating movements intact. Test of skew positive. Ocular ROM appears full but smooth pursuit is mildly saccadic. No spontaneous or gaze-evoked nystagmus noted. Horizontal and vertical saccades are WNL. VOR and VOR cancellation are negative. Head impulse test  positive bilaterally but appears more coincidental as pt denies any worsening of his dizziness/unsteadiness with head turns. Pronator drift negative and pt reports fully intact sensation to light touch in bilateral UE/LE. Pt does present with significant bilateral ankle dorsiflexion and great toe dorsiflexion weakness. Gait is ataxic without assistive device with considerable foot slap noted bilaterally due to foot drop. Pt also with staggering left and right during gait. Pt denies history of peripheral neuropathy or diabetes. Pt unable to achieve or maintain feet together stance as he falls over immediately. Pt would benefit from futher work-up to determine cause of bilateral foot drop and ataxic gait. He may benefit from assessment for bilateral AFOs if foot drop is contributing to his falls which is very likely. He is safe to return home but must use his rolling walker at all times. He would benefit from [facility] PT to work on balance. Pt will benefit from skilled PT services to address deficits in strength, balance, and mobility in order to return to full function at home.     Follow Up Recommendations Home health PT    Equipment Recommendations  None recommended by PT (Must use his rolling walker at all time)    Recommendations for Other Services       Precautions / Restrictions Precautions Precautions: Fall Restrictions Weight Bearing Restrictions: No      Mobility  Bed Mobility Overal bed mobility: Modified Independent             General bed mobility comments: HOB elevated and bed rails utilized  Transfers Overall transfer level: Needs assistance Equipment used: Rolling walker (2 wheeled) Transfers: Sit to/from Stand Sit to Stand: Min guard  General transfer comment: Pt is mildly unsteady but self-corrects with UE support. Fair LE strength noted during sit to stand transfer. Definite use of UE for stabilization  Ambulation/Gait Ambulation/Gait assistance: Min  guard Ambulation Distance (Feet): 100 Feet Assistive device: Rolling walker (2 wheeled) Gait Pattern/deviations: Decreased step length - right;Decreased step length - left;Decreased dorsiflexion - left;Decreased dorsiflexion - right;Ataxic Gait velocity: Decreased Gait velocity interpretation: <1.8 ft/sec, indicative of risk for recurrent falls General Gait Details: Pt ambulates with ataxic gait with wide base of support without rolling walker. Pt intermittently staggers left and right. Bilateral foot slap noted during ambulation with decreased dorsiflexion observed. Pt is definately more steady when provided rolling walker. Pt encouraged to use rolling walker at all times with ambulation.   Stairs            Wheelchair Mobility    Modified Rankin (Stroke Patients Only)       Balance Overall balance assessment: Needs assistance Sitting-balance support: No upper extremity supported Sitting balance-Leahy Scale: Good     Standing balance support: No upper extremity supported Standing balance-Leahy Scale: Fair Standing balance comment: Unable to achieve feet together stance without LOB.                              Pertinent Vitals/Pain Pain Assessment: 0-10    Home Living Family/patient expects to be discharged to:: Private residence Living Arrangements: Alone Available Help at Discharge: Family Type of Home: House Home Access: Stairs to enter Entrance Stairs-Rails: Left Entrance Stairs-Number of Steps: 3 Home Layout: One level Home Equipment: Emergency planning/management officer - 2 wheels;Cane - single point      Prior Function Level of Independence: Independent with assistive device(s)         Comments: Full community ambulator with spc. Independent with ADLs/IADLs. 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 Pt with notable weakness in ankle dorsiflexion and great toe extension bilaterally. Reports fully intact sensation throughout bilateral UE/LE to light touch.        Communication   Communication: No difficulties  Cognition Arousal/Alertness: Awake/alert Behavior During Therapy: WFL for tasks assessed/performed Overall Cognitive Status: Within Functional Limits for tasks assessed                      General Comments      Exercises     Assessment/Plan    PT Assessment Patient needs continued PT services  PT Problem List Decreased strength;Decreased balance;Decreased coordination;Obesity          PT Treatment Interventions      PT Goals (Current goals can be found in the Care Plan section)  Acute Rehab PT Goals Patient Stated Goal: Decrease falls Time For Goal Achievement: 02/20/16 Potential to Achieve Goals: Good    Frequency 7X/week   Barriers to discharge Decreased caregiver support Lives alone    Co-evaluation               End of Session Equipment Utilized During Treatment: Gait belt Activity Tolerance: Patient tolerated treatment well Patient left: with nursing/sitter in room (In bathroom with CNA)      Functional Assessment Tool Used: clinical judgement Functional Limitation: Mobility:  Walking and moving around Mobility: Walking and Moving Around Current Status [phone]([id number]): At least 40 percent but less than 60 percent impaired, limited or restricted Mobility: Walking and Moving Around Goal Status [phone](G8979): At least 20 percent but less than 40 percent impaired, limited or restricted    Time: 0900-0930 PT Time Calculation (min) (ACUTE ONLY): 30 min   Charges:   PT Evaluation $PT Eval Moderate Complexity: 1 Procedure PT Treatments $Gait Training: 8-22 mins   PT G Codes:   PT G-Codes **NOT FOR INPATIENT CLASS** Functional Assessment Tool Used:  clinical judgement Functional Limitation: Mobility: Walking and moving around Mobility: Walking and Moving Around Current Status ([id number](G8978): At least 40 percent but less than 60 percent impaired, limited or restricted Mobility: Walking and Moving Around Goal Status [phone]([id number]): At least 20 percent but less than 40 percent impaired, limited or restricted   Lynnea MaizesJason D Huprich PT, DPT   Huprich,Jason 02/06/2016, 9:42 AM

## 2016-02-06 NOTE — Consult Note (Signed)
Reason for Consult:Diziness Referring Physician: Allena KatzPatel  CC: Dizziness, falls  HPI: Edward Thompson is an 65 y.o. male who reports a history of dizziness for the past 2 years.  Has been followed on an outpatient basis by his PCP with Meclizine that he reports does not really help.  Feels his dizziness has been worse for the past 1-2 weeks.  Describes the dizziness as feeling off balance and as if he is about to fall.   As he is falling is unable to catch himself.  Feels that he is dragging his leg and trips over his toes.  Describes some RUE numbness on presentation that resolved.  Has fallen frequently in the past week.   On Dilantin and Phenobarbital with a history of seizures.   Recently seen by Dr. Welton FlakesKhan on an outpatient basis and carotid dopplers performed that showed bilateral stenosis per patient.    Past Medical History:  Diagnosis Date  . CAD (coronary artery disease)   . Diabetes mellitus without complication (HCC)   . Heart attack (HCC)   . Seizures (HCC)    taking phenobarbitol and dilantin  . Vertigo   . Viral meningitis     Past Surgical History:  Procedure Laterality Date  . CARDIAC SURGERY    . CORONARY ANGIOPLASTY    . HERNIA REPAIR      Family History  Problem Relation Age of Onset  . Heart disease Mother   . Heart attack Father     Social History:  reports that he has never smoked. He has never used smokeless tobacco. He reports that he does not drink alcohol or use drugs.  No Known Allergies  Medications:  I have reviewed the patient's current medications. Prior to Admission:  Prescriptions Prior to Admission  Medication Sig Dispense Refill Last Dose  . aspirin EC 81 MG tablet Take 81 mg by mouth daily.   02/05/2016 at 0800  . clopidogrel (PLAVIX) 75 MG tablet 1 tablet.   02/04/2016 at 2330  . meclizine (ANTIVERT) 25 MG tablet Take 1 tablet by mouth 3 (three) times daily as needed.   02/05/2016 at 0730  . metoprolol tartrate (LOPRESSOR) 25 MG tablet Take 1  tablet by mouth 2 (two) times daily.   02/05/2016 at 0730  . pantoprazole (PROTONIX) 40 MG tablet Take 1 tablet by mouth daily.   02/05/2016 at 0730  . PHENobarbital (LUMINAL) 64.8 MG tablet Take 2 tablets by mouth at bedtime.   02/05/2016 at 0730  . phenytoin (DILANTIN) 100 MG ER capsule 3 capsules at bedtime.   02/05/2016 at 0730   Scheduled: . aspirin EC  81 mg Oral Daily  . atorvastatin  40 mg Oral q1800  . clopidogrel  75 mg Oral Daily  . enoxaparin (LOVENOX) injection  40 mg Subcutaneous Q24H  . insulin aspart  0-5 Units Subcutaneous QHS  . insulin aspart  0-9 Units Subcutaneous TID WC  . living well with diabetes book   Does not apply Once  . meclizine  25 mg Oral TID  . metFORMIN  500 mg Oral BID WC  . metoprolol tartrate  25 mg Oral BID  . pantoprazole  40 mg Oral Daily  . PHENobarbital  129.6 mg Oral QHS  . phenytoin  300 mg Oral QHS  . sodium chloride flush  3 mL Intravenous Q12H    ROS: History obtained from the patient  General ROS: negative for - chills, fatigue, fever, night sweats, weight gain or weight loss Psychological ROS: negative  for - behavioral disorder, hallucinations, memory difficulties, mood swings or suicidal ideation Ophthalmic ROS: negative for - blurry vision, double vision, eye pain or loss of vision ENT ROS: negative for - epistaxis, nasal discharge, oral lesions, sore throat, tinnitus Allergy and Immunology ROS: negative for - hives or itchy/watery eyes Hematological and Lymphatic ROS: negative for - bleeding problems, bruising or swollen lymph nodes Endocrine ROS: negative for - galactorrhea, hair pattern changes, polydipsia/polyuria or temperature intolerance Respiratory ROS: negative for - cough, hemoptysis, shortness of breath or wheezing Cardiovascular ROS: LE swelling Gastrointestinal ROS: negative for - abdominal pain, diarrhea, hematemesis, nausea/vomiting or stool incontinence Genito-Urinary ROS: negative for - dysuria, hematuria,  incontinence or urinary frequency/urgency Musculoskeletal ROS: negative for - joint swelling or muscular weakness Neurological ROS: as noted in HPI Dermatological ROS: negative for rash and skin lesion changes  Physical Examination: Blood pressure 136/75, pulse 71, temperature 97.6 F (36.4 C), temperature source Oral, resp. rate 18, height 5\' 4"  (1.626 m), weight 97.6 kg (215 lb 1.6 oz), SpO2 96 %.  HEENT-  Normocephalic, no lesions, without obvious abnormality.  Normal external eye and conjunctiva.  Normal TM's bilaterally.  Normal auditory canals and external ears. Normal external nose, mucus membranes and septum.  Normal pharynx. Cardiovascular- S1, S2 normal, pulses palpable throughout   Lungs- chest clear, no wheezing, rales, normal symmetric air entry Abdomen- soft, non-tender; bowel sounds normal; no masses,  no organomegaly Extremities- BLE edema Lymph-no adenopathy palpable Musculoskeletal-no joint tenderness, deformity or swelling Skin-warm and dry, no hyperpigmentation, vitiligo, or suspicious lesions  Neurological Examination Mental Status: Alert, oriented, thought content appropriate.  Speech fluent without evidence of aphasia.  Able to follow 3 step commands without difficulty. Cranial Nerves: II: Discs flat bilaterally; Visual fields grossly normal, pupils equal, round, reactive to light and accommodation III,IV, VI: ptosis not present, extra-ocular motions intact bilaterally V,VII: smile symmetric, facial light touch sensation normal bilaterally VIII: hearing normal bilaterally IX,X: gag reflex present XI: bilateral shoulder shrug XII: midline tongue extension Motor: Right : Upper extremity   5/5    Left:     Upper extremity   5/5 Strength 5/5 in the BLE proximally with inability to flex at the ankles bilaterally.  Difficulty planning movement with the lower extremities.  Normal tone.   Sensory: Pinprick and light touch intact throughout, bilaterally Deep Tendon  Reflexes: 2+ in the upper extremities and absent in the lower extremities.   Plantars: Right: mute   Left: mute Cerebellar: Normal finger-to-nose and normal heel-to-shin testing bilaterally Gait: not tested due to safety concerns   Laboratory Studies:   Basic Metabolic Panel:  Recent Labs Lab 02/05/16 1346 02/06/16 0419  NA 133* 135  K 4.0 3.8  CL 100* 105  CO2 23 22  GLUCOSE 357* 226*  BUN 13 12  CREATININE 0.75 0.59*  CALCIUM 9.2 8.7*    Liver Function Tests:  Recent Labs Lab 02/05/16 1346  AST 16  ALT 18  ALKPHOS 123  BILITOT 0.5  PROT 7.9  ALBUMIN 4.1   No results for input(s): LIPASE, AMYLASE in the last 168 hours. No results for input(s): AMMONIA in the last 168 hours.  CBC:  Recent Labs Lab 02/05/16 1346  WBC 6.5  NEUTROABS 3.7  HGB 14.2  HCT 39.9*  MCV 85.9  PLT 170    Cardiac Enzymes:  Recent Labs Lab 02/05/16 1346  TROPONINI <0.03    BNP: Invalid input(s): POCBNP  CBG:  Recent Labs Lab 02/05/16 1337 02/05/16 2125 02/06/16 9233  GLUCAP 327* 370* 242*    Microbiology: No results found for this or any previous visit.  Coagulation Studies:  Recent Labs  02/05/16 1346  LABPROT 14.0  INR 1.08    Urinalysis:  Recent Labs Lab 02/05/16 1450  COLORURINE STRAW*  LABSPEC 1.032*  PHURINE 5.0  GLUCOSEU >500*  HGBUR NEGATIVE  BILIRUBINUR NEGATIVE  KETONESUR NEGATIVE  PROTEINUR NEGATIVE  NITRITE NEGATIVE  LEUKOCYTESUR NEGATIVE    Lipid Panel:     Component Value Date/Time   CHOL 133 02/06/2016 0419   CHOL 117 09/23/2013 0132   TRIG 138 02/06/2016 0419   TRIG 64 09/23/2013 0132   HDL 38 (L) 02/06/2016 0419   HDL 49 09/23/2013 0132   CHOLHDL 3.5 02/06/2016 0419   VLDL 28 02/06/2016 0419   VLDL 13 09/23/2013 0132   LDLCALC 67 02/06/2016 0419   LDLCALC 55 09/23/2013 0132    HgbA1C: No results found for: HGBA1C  Urine Drug Screen:     Component Value Date/Time   LABOPIA NONE DETECTED 02/05/2016 1450    COCAINSCRNUR NONE DETECTED 02/05/2016 1450   LABBENZ NONE DETECTED 02/05/2016 1450   AMPHETMU NONE DETECTED 02/05/2016 1450   THCU NONE DETECTED 02/05/2016 1450   LABBARB POSITIVE (A) 02/05/2016 1450    Alcohol Level:  Recent Labs Lab 02/05/16 1346  ETH <5     Imaging: Dg Chest Portable 1 View  Result Date: 02/05/2016 CLINICAL DATA:  65 year old with acute onset of dizziness while at work earlier today, associated with right upper extremity weakness. EXAM: PORTABLE CHEST 1 VIEW COMPARISON:  09/22/2013, 08/18/2013. FINDINGS: Cardiac silhouette mildly to moderately enlarged for AP portable technique, unchanged. Thoracic aorta mildly atherosclerotic, unchanged. Hilar and mediastinal contours otherwise unremarkable. Stable linear scarring at the left lung base. Lungs otherwise clear. Pulmonary vascularity normal. No visible pleural effusions. IMPRESSION: 1. Stable cardiomegaly. Stable scarring at the left lung base. No acute cardiopulmonary disease. 2. Thoracic aortic atherosclerosis. Electronically Signed   By: Hulan Saas M.D.   On: 02/05/2016 14:17   Ct Head Code Stroke W/o Cm  Result Date: 02/05/2016 CLINICAL DATA:  Code stroke. 65 year old male with sudden onset of dizziness and right upper extremity weakness. Initial encounter. EXAM: CT HEAD WITHOUT CONTRAST TECHNIQUE: Contiguous axial images were obtained from the base of the skull through the vertex without intravenous contrast. COMPARISON:  05/09/2011 CT. FINDINGS: Brain: No intracranial hemorrhage or CT evidence of large acute infarct. Moderate chronic microvascular changes. Streak artifact extends through the the posterior fossa. Mild global atrophy without hydrocephalus. Prominent calcification of the falx. No intracranial mass lesion noted on this unenhanced exam. Vascular: Prominent calcifications vertebral arteries, basilar artery and internal carotid arteries. There may be underlying significant stenosis. Skull: Negative.  Sinuses/Orbits: Mucosal thickening right frontal sinus. Opacification right frontal ethmoidal junction. Minimal mucosal thickening anterior ethmoid sinus air cells. Minimal mucosal thickening right sphenoid sinus. Other: Negative. ASPECTS [facility] Stroke Program Early CT Score) - Ganglionic level infarction (caudate, lentiform nuclei, internal capsule, insula, M1-M3 cortex): 7 - Supraganglionic infarction (M4-M6 cortex): 3 Total score (0-10 with 10 being normal): 10 IMPRESSION: 1. No intracranial hemorrhage or CT evidence of large acute infarct. Moderate chronic microvascular changes. Prominent calcifications vertebral arteries, basilar artery and internal carotid arteries. There may be underlying significant stenosis. Mucosal thickening right frontal sinus. Opacification right frontal ethmoidal junction. Minimal mucosal thickening anterior ethmoid sinus air cells. 1. ASPECTS is 10 These results were called by telephone at the time of interpretation on 02/05/2016 at 2:53 pm to Dr. Aneta Mins  STAFFORD , who verbally acknowledged these results. Electronically Signed   By: Lacy Duverney M.D.   On: 02/05/2016 15:00     Assessment/Plan: 65 year old male presenting with worsening dizziness.  Etiology unclear.  Has recent history of stenosis by carotid doppler.  Per patient's report though symptoms are with standing and do not seem to fit with an ischemic cause.  Patient is on Dilantin and level needs to be checked.  Patient also with peripheral neuropathy, likely related to his poorly controlled diabetes, which may be contributing as well.  Further work up recommended.    Recommendations: 1.  CTA of the neck 2.  MRI of the brain without contrast 3.  Dilantin level 4.  PT/OT consult  Thana Farr, MD Neurology [phone] 02/06/2016, 12:05 PM

## 2016-02-06 NOTE — Progress Notes (Addendum)
Inpatient Diabetes Program Recommendations  AACE/ADA: New Consensus Statement on Inpatient Glycemic Control (2015)  Target Ranges:  Prepandial:   less than 140 mg/dL      Peak postprandial:   less than 180 mg/dL (1-2 hours)      Critically ill patients:  140 - 180 mg/dL   Results for MARQUE, RADEMAKER (MRN [id number]) as of 02/06/2016 08:40  Ref. Range 02/05/2016 13:37 02/05/2016 21:25 02/06/2016 07:21  Glucose-Capillary Latest Ref Range: 65 - 99 mg/dL 327 (H) 370 (H) 242 (H)   A1C in process.  Review of Glycemic Control  Diabetes history: none Outpatient Diabetes medications: none Current orders for Inpatient glycemic control: Novolog Sensitive Correction SSI TIDACHS  Inpatient Diabetes Program Recommendations: Depending on A1C results, may want to consider starting oral DM medications to determine effect on glycemic control or MD may want to consider low dose basal.  If basal insulin is started, would recommend an affordable insulin such as NPH or Novolog Mix 70/30.  Also, if diagnosed with DM, please make note in progress note.  IDP will then f/u with Living Well With DM book and start pt. Education.  Thank you,  Windy Carina, RN, BSN Diabetes Coordinator Inpatient Diabetes Program [phone] (Team Pager) [phone] (AP office) [phone] [facility] office) [phone] [facility] office)

## 2016-02-06 NOTE — Care Management Obs Status (Signed)
MEDICARE OBSERVATION STATUS NOTIFICATION   Patient Details  Name: ARUTHER HERFORD MRN: [id number] Date of Birth: June 12, 1950   Medicare Observation Status Notification Given:  Yes    Gwenette Greet, RN 02/06/2016, 3:33 PM

## 2016-02-07 LAB — GLUCOSE, CAPILLARY
Glucose-Capillary: 258 mg/dL — ABNORMAL HIGH (ref 65–99)
Glucose-Capillary: 278 mg/dL — ABNORMAL HIGH (ref 65–99)

## 2016-02-07 LAB — HEMOGLOBIN A1C
HEMOGLOBIN A1C: 11.7 % — AB (ref 4.8–5.6)
Mean Plasma Glucose: 289 mg/dL

## 2016-02-07 MED ORDER — ATORVASTATIN CALCIUM 40 MG PO TABS: 40.0000 mg | ORAL_TABLET | Freq: Every day | ORAL | 2 refills | Status: DC

## 2016-02-07 MED ORDER — METFORMIN HCL 850 MG PO TABS
850.0000 mg | ORAL_TABLET | Freq: Two times a day (BID) | ORAL | Status: DC
  Administered 2016-02-07: 09:00:00 850 mg via ORAL
  Filled 2016-02-07 (×2): qty 1

## 2016-02-07 MED ORDER — METFORMIN HCL 850 MG PO TABS: 850.0000 mg | ORAL_TABLET | Freq: Two times a day (BID) | ORAL | 3 refills | Status: DC

## 2016-02-07 NOTE — Progress Notes (Signed)
Discharge instructions given and went over with patient and daughter at bedside. Prescriptions given. Follow-up appointments reviewed. DM education re-reviewed. All questions answered. Patient discharged home with daughter via wheelchair by nursing staff. Bo Mcclintock, RN

## 2016-02-07 NOTE — Care Management Note (Signed)
Case Management Note  Patient Details  Name: KALIL BONFANTE MRN: [id number] Date of Birth: February 18, 1951  Subjective/Objective:         Discussed discharge planning with Mr [place]. He chose [facility] and a referral for home health PT was faxed to [facility].          Action/Plan:   Expected Discharge Date:                  Expected Discharge Plan:     In-House Referral:     Discharge planning Services     Post Acute Care Choice:    Choice offered to:     DME Arranged:    DME Agency:     HH Arranged:    HH Agency:     Status of Service:     If discussed at [employer] of Stay Meetings, dates discussed:    Additional Comments:  Quita Mcgrory A, RN 02/07/2016, 9:54 AM

## 2016-02-07 NOTE — Progress Notes (Signed)
Awaiting daughter to transport patient home. Bo Mcclintock, RN

## 2016-02-07 NOTE — Discharge Summary (Signed)
[facility] - [place] [facility]   PATIENT NAME: Edward Thompson    MR#:  [id number]  DATE OF BIRTH:  Jan 01, 1951  DATE OF ADMISSION:  02/05/2016 ADMITTING PHYSICIAN: Shaune PollackQing Chen, MD  DATE OF DISCHARGE: 9/30.17  PRIMARY CARE PHYSICIAN: Luna FuseEJAN-SIE, SHEIKH AHMED, MD    ADMISSION DIAGNOSIS:  Mild dehydration [E86.0] Orthostatic dizziness [R42]  DISCHARGE DIAGNOSIS:  Acute on chronic dizziness New onset Uncontrolled DM-2 Left ICA 60 % stenosis HTN  SECONDARY DIAGNOSIS:   Past Medical History:  Diagnosis Date  . CAD (coronary artery disease)   . Diabetes mellitus without complication (HCC)   . Heart attack (HCC)   . Seizures (HCC)    taking phenobarbitol and dilantin  . Vertigo   . Viral meningitis     HOSPITAL COURSE:  Edward Thompson a 65 y.o.malewith a known history of MI, CAD, vertigoand seizure disorder. He has had dizziness for 2 years, worsening for 1-2 weeks.Dizziness worsened with standing. He has had unsteady gait and fell multiple times at home recently. He got ankle fracture due to fall 2 weeks ago  1. Dizziness. Unclear etiology. - orthostatic Vital sign negative -prn Give Antivert 3 times a day. Neurology consult appreciated. MRI brain negative -CT angio neck shows 60% stenosis left ICA--out pt vascular f/u -recommend ENT f/u  2.Fall and unsteady gait. Fall precautions and PT evaluation noted-rec HHPT  3. Left ICA 60% Carotidstenosis. Continue aspirin and plavix, onstatin.\ -outpt vascular consult  4.Hyponatremia. Possible pseudohyponatremia due to hyperglycemia. Start normal saline IV and follow-up BMP.  5. DM-2 new onset hemoglobin A1c is 11.7 and cont sliding scale. -started on metformin bid increased to 850 mg bid  6.HTN. Continue lopressor bid and  iv hydralazine prn  7.Seizure disorder. Continue Dilantin.   D/c home with HHPT CONSULTS OBTAINED:  Treatment Team:  Thana FarrLeslie Reynolds, MD  DRUG  ALLERGIES:  No Known Allergies  DISCHARGE MEDICATIONS:   Current Discharge Medication List    START taking these medications   Details  atorvastatin (LIPITOR) 40 MG tablet Take 1 tablet (40 mg total) by mouth daily at 6 PM. Qty: 30 tablet, Refills: 2    metFORMIN (GLUCOPHAGE) 850 MG tablet Take 1 tablet (850 mg total) by mouth 2 (two) times daily with a meal. Qty: 60 tablet, Refills: 3    ondansetron (ZOFRAN ODT) 4 MG disintegrating tablet Take 1 tablet (4 mg total) by mouth every 8 (eight) hours as needed for nausea or vomiting. Qty: 20 tablet, Refills: 0      CONTINUE these medications which have NOT CHANGED   Details  aspirin EC 81 MG tablet Take 81 mg by mouth daily.    clopidogrel (PLAVIX) 75 MG tablet 1 tablet.    meclizine (ANTIVERT) 25 MG tablet Take 1 tablet by mouth 3 (three) times daily as needed.    metoprolol tartrate (LOPRESSOR) 25 MG tablet Take 1 tablet by mouth 2 (two) times daily.    pantoprazole (PROTONIX) 40 MG tablet Take 1 tablet by mouth daily.    PHENobarbital (LUMINAL) 64.8 MG tablet Take 2 tablets by mouth at bedtime.    phenytoin (DILANTIN) 100 MG ER capsule 3 capsules at bedtime.        If you experience worsening of your admission symptoms, develop shortness of breath, life threatening emergency, suicidal or homicidal thoughts you must seek medical attention immediately by calling 911 or calling your MD immediately  if symptoms less severe.  You Must read complete instructions/literature along with all the possible  adverse reactions/side effects for all the Medicines you take and that have been prescribed to you. Take any new Medicines after you have completely understood and accept all the possible adverse reactions/side effects.   Please note  You were cared for by a hospitalist during your hospital stay. If you have any questions about your discharge medications or the care you received while you were in the hospital after you are  discharged, you can call the unit and asked to speak with the hospitalist on call if the hospitalist that took care of you is not available. Once you are discharged, your primary care physician will handle any further medical issues. Please note that NO REFILLS for any discharge medications will be authorized once you are discharged, as it is imperative that you return to your primary care physician (or establish a relationship with a primary care physician if you do not have one) for your aftercare needs so that they can reassess your need for medications and monitor your lab values. Today   SUBJECTIVE   Doing well. Some dizziness  VITAL SIGNS:  Blood pressure (!) 121/59, pulse 88, temperature 98.5 F (36.9 C), temperature source Oral, resp. rate 17, height 5\' 4"  (1.626 m), weight 97.6 kg (215 lb 1.6 oz), SpO2 95 %.  I/O:   Intake/Output Summary (Last 24 hours) at 02/07/16 0834 Last data filed at 02/06/16 1900  Gross per 24 hour  Intake              360 ml  Output                0 ml  Net              360 ml    PHYSICAL EXAMINATION:  GENERAL:  65 y.o.-year-old patient lying in the bed with no acute distress.  EYES: Pupils equal, round, reactive to light and accommodation. No scleral icterus. Extraocular muscles intact.  HEENT: Head atraumatic, normocephalic. Oropharynx and nasopharynx clear.  NECK:  Supple, no jugular venous distention. No thyroid enlargement, no tenderness.  LUNGS: Normal breath sounds bilaterally, no wheezing, rales,rhonchi or crepitation. No use of accessory muscles of respiration.  CARDIOVASCULAR: S1, S2 normal. No murmurs, rubs, or gallops.  ABDOMEN: Soft, non-tender, non-distended. Bowel sounds present. No organomegaly or mass.  EXTREMITIES: No pedal edema, cyanosis, or clubbing.  NEUROLOGIC: Cranial nerves II through XII are intact. Muscle strength 5/5 in all extremities. Sensation intact. Gait not checked.  PSYCHIATRIC: patient is alert and oriented x 3.   SKIN: No obvious rash, lesion, or ulcer.   DATA REVIEW:   CBC   Recent Labs Lab 02/05/16 1346  WBC 6.5  HGB 14.2  HCT 39.9*  PLT 170    Chemistries   Recent Labs Lab 02/05/16 1346 02/06/16 0419  NA 133* 135  K 4.0 3.8  CL 100* 105  CO2 23 22  GLUCOSE 357* 226*  BUN 13 12  CREATININE 0.75 0.59*  CALCIUM 9.2 8.7*  AST 16  --   ALT 18  --   ALKPHOS 123  --   BILITOT 0.5  --     Microbiology Results   No results found for this or any previous visit (from the past 240 hour(s)).  RADIOLOGY:  Ct Angio Neck W Or Wo Contrast  Result Date: 02/06/2016 CLINICAL DATA:  Dizziness. EXAM: CT ANGIOGRAPHY NECK TECHNIQUE: Multidetector CT imaging of the neck was performed using the standard protocol during bolus administration of intravenous contrast. Multiplanar CT image reconstructions  and MIPs were obtained to evaluate the vascular anatomy. Carotid stenosis measurements (when applicable) are obtained utilizing NASCET criteria, using the distal internal carotid diameter as the denominator. CONTRAST:  75 cc Isovue 370 intravenous COMPARISON:  None. FINDINGS: Aortic arch: Atherosclerotic calcification. No aneurysm or dissection. Three vessel branching. Right carotid system: Tortuous with proximal ICA extending to or just beyond the mandibular angle at the level of the carotid bifurcation. Atherosclerotic plaque mainly at the bifurcation with no narrowing greater than 50%. No dissection or beading. No visible plaque ulceration. Left carotid system: Prominent mixed density plaque on the proximal ICA with up to 60% stenosis of the proximal ICA. The left ICA is smaller than the right, which could be related to a nonvisualized intracranial circulation. No dissection or ulceration. Vertebral arteries: No proximal subclavian stenosis. Codominant vessels that are smooth and patent to the dura. Moderate left ostial stenosis. There is calcified atherosclerotic plaque on the bilateral V4 segments with  up to mild right and moderate left V4 stenosis . Skeleton: No acute or aggressive finding Other neck: Negative Upper chest: Probable patchy atelectasis in the upper chest. IMPRESSION: 1. No acute finding. 2. Atherosclerosis with up to 60% proximal left ICA stenosis. No flow limiting stenosis in the right carotid. 3. Codominant vertebral arteries with moderate left ostial stenosis. Mild-to-moderate bilateral V4 atherosclerotic narrowing. Electronically Signed   By: Marnee Spring M.D.   On: 02/06/2016 12:19   Mr Brain Wo Contrast  Result Date: 02/06/2016 CLINICAL DATA:  Dizziness and falls EXAM: MRI HEAD WITHOUT CONTRAST TECHNIQUE: Multiplanar, multiecho pulse sequences of the brain and surrounding structures were obtained without intravenous contrast. COMPARISON:  CT 02/05/2016 FINDINGS: Brain: Negative for acute infarct. Generalized atrophy. Mild chronic microvascular ischemic change in the white matter. Brainstem and cerebellum intact. No intracranial hemorrhage or mass lesion. No shift of the midline structures. Pituitary not enlarged. Vascular: Normal flow voids. Skull and upper cervical spine: Negative Sinuses/Orbits: Mild mucosal edema paranasal sinuses. Normal orbit bilaterally. Other: None IMPRESSION: Atrophy and mild chronic microvascular ischemia in the white matter. No acute abnormality. Electronically Signed   By: Marlan Palau M.D.   On: 02/06/2016 15:51   Dg Chest Portable 1 View  Result Date: 02/05/2016 CLINICAL DATA:  65 year old with acute onset of dizziness while at work earlier today, associated with right upper extremity weakness. EXAM: PORTABLE CHEST 1 VIEW COMPARISON:  09/22/2013, 08/18/2013. FINDINGS: Cardiac silhouette mildly to moderately enlarged for AP portable technique, unchanged. Thoracic aorta mildly atherosclerotic, unchanged. Hilar and mediastinal contours otherwise unremarkable. Stable linear scarring at the left lung base. Lungs otherwise clear. Pulmonary vascularity  normal. No visible pleural effusions. IMPRESSION: 1. Stable cardiomegaly. Stable scarring at the left lung base. No acute cardiopulmonary disease. 2. Thoracic aortic atherosclerosis. Electronically Signed   By: Hulan Saas M.D.   On: 02/05/2016 14:17   Ct Head Code Stroke W/o Cm  Result Date: 02/05/2016 CLINICAL DATA:  Code stroke. 65 year old male with sudden onset of dizziness and right upper extremity weakness. Initial encounter. EXAM: CT HEAD WITHOUT CONTRAST TECHNIQUE: Contiguous axial images were obtained from the base of the skull through the vertex without intravenous contrast. COMPARISON:  05/09/2011 CT. FINDINGS: Brain: No intracranial hemorrhage or CT evidence of large acute infarct. Moderate chronic microvascular changes. Streak artifact extends through the the posterior fossa. Mild global atrophy without hydrocephalus. Prominent calcification of the falx. No intracranial mass lesion noted on this unenhanced exam. Vascular: Prominent calcifications vertebral arteries, basilar artery and internal carotid arteries. There may be  underlying significant stenosis. Skull: Negative. Sinuses/Orbits: Mucosal thickening right frontal sinus. Opacification right frontal ethmoidal junction. Minimal mucosal thickening anterior ethmoid sinus air cells. Minimal mucosal thickening right sphenoid sinus. Other: Negative. ASPECTS [facility] Stroke Program Early CT Score) - Ganglionic level infarction (caudate, lentiform nuclei, internal capsule, insula, M1-M3 cortex): 7 - Supraganglionic infarction (M4-M6 cortex): 3 Total score (0-10 with 10 being normal): 10 IMPRESSION: 1. No intracranial hemorrhage or CT evidence of large acute infarct. Moderate chronic microvascular changes. Prominent calcifications vertebral arteries, basilar artery and internal carotid arteries. There may be underlying significant stenosis. Mucosal thickening right frontal sinus. Opacification right frontal ethmoidal junction. Minimal mucosal  thickening anterior ethmoid sinus air cells. 1. ASPECTS is 10 These results were called by telephone at the time of interpretation on 02/05/2016 at 2:53 pm to Dr. Sharman Cheek , who verbally acknowledged these results. Electronically Signed   By: Lacy Duverney M.D.   On: 02/05/2016 15:00     Management plans discussed with the patient, family and they are in agreement.  CODE STATUS:     Code Status Orders        Start     Ordered   02/05/16 1755  Full code  Continuous     02/05/16 1758    Code Status History    Date Active Date Inactive Code Status Order ID Comments User Context   This patient has a current code status but no historical code status.      TOTAL TIME TAKING CARE OF THIS PATIENT: 40 minutes.    Jerral Mccauley M.D on 02/07/2016 at 8:34 AM  Between 7am to 6pm - Pager - [phone] After 6pm go to [email or web address] - password [email or web address] [facility]  [place]  [facility]  Office  [phone]  CC: Primary care physician; Luna Fuse, MD

## 2016-02-07 NOTE — Discharge Instructions (Signed)
F/u with ENT and Vascular surgery

## 2016-02-07 NOTE — Progress Notes (Signed)
Physical Therapy Treatment Patient Details Name: Edward CouncilmanLeonard R Thompson MRN: [id number] DOB: 1950/11/07 Today's Date: 02/07/2016    History of Present Illness Edward IraniLeonard Thompson  is a 65 y.o. male with a known history of MI, CAD, vertigo and seizure disorder. He has had dizziness for 2 years, worsening for 1-2 weeks. Dizziness worsened with standing. He has had unsteady gait and fell multiple times at home recently. He got ankle fracture due to fall 2 weeks ago. He denies any headache, chest pain or palpitation, no weakness or numbness. He recently got carotid duplex in Dr. Milta DeitersKhan's office, which show carotid stenosis. Pt reports >12 falls over the last couple weeks. Pt denies onset of symptoms with rolling in bed. Symptoms only occur when he stands up. Pt denies vertigo. Reports sensation of feeling unsteady "like I'm going to fall." Denies feeling lightheaded or fainting sensations. Denies chills, fevers, night sweats, diplopia, blurring of vision, otorrhea, otalgia, aural fullness, tinnitis, headaches, or migraines. Confirms 10# weight loss over the last 2 months. Upon further questioning pt reports that his dizziness is more "unsteadiness." Pt denies feeling vertigo or "swimmy headed."     PT Comments    Pt in bed, agrees to ambulation.  Transitioned out of bed with ease and use of rail, HOB raised.  He was able to stand and ambulate 100' with rolling walker and min guard.  Pt continued to report that he feels unsteady on his feet but has no LOB's.  Pt was encouraged to use rolling walker at all times upon discharge and voiced understanding.     Follow Up Recommendations  Home health PT     Equipment Recommendations  None recommended by PT    Recommendations for Other Services       Precautions / Restrictions Precautions Precautions: Fall Restrictions Weight Bearing Restrictions: No    Mobility  Bed Mobility Overal bed mobility: Modified Independent             General bed mobility  comments: HOB elevated and bed rails utilized  Transfers Overall transfer level: Needs assistance Equipment used: Rolling walker (2 wheeled) Transfers: Sit to/from Stand Sit to Stand: Min guard            Ambulation/Gait Ambulation/Gait assistance: Min guard Ambulation Distance (Feet): 100 Feet Assistive device: Rolling walker (2 wheeled) Gait Pattern/deviations: Step-through pattern;Decreased step length - right;Decreased step length - left;Decreased dorsiflexion - right;Decreased dorsiflexion - left Gait velocity: Decreased Gait velocity interpretation: <1.8 ft/sec, indicative of risk for recurrent falls     Stairs            Wheelchair Mobility    Modified Rankin (Stroke Patients Only)       Balance Overall balance assessment: Needs assistance Sitting-balance support: No upper extremity supported;Feet supported Sitting balance-Leahy Scale: Good     Standing balance support: No upper extremity supported Standing balance-Leahy Scale: Fair                      Cognition Arousal/Alertness: Awake/alert Behavior During Therapy: Flat affect;WFL for tasks assessed/performed Overall Cognitive Status: Within Functional Limits for tasks assessed                      Exercises      General Comments        Pertinent Vitals/Pain Pain Assessment: No/denies pain    Home Living  Prior Function            PT Goals (current goals can now be found in the care plan section) Acute Rehab PT Goals Patient Stated Goal: Decrease falls Progress towards PT goals: Progressing toward goals    Frequency    7X/week      PT Plan Current plan remains appropriate    Co-evaluation             End of Session Equipment Utilized During Treatment: Gait belt Activity Tolerance: Patient tolerated treatment well Patient left: in chair;with chair alarm set;with call bell/phone within reach     Time: 0850-0858 PT  Time Calculation (min) (ACUTE ONLY): 8 min  Charges:  $Gait Training: 8-22 mins                    G Codes:      Danielle Dess, PTA 02/07/16, 9:05 AM

## 2016-02-25 ENCOUNTER — Emergency Department: Payer: Worker's Compensation

## 2016-02-25 ENCOUNTER — Encounter: Payer: Self-pay | Admitting: *Deleted

## 2016-02-25 ENCOUNTER — Emergency Department
Admission: EM | Admit: 2016-02-25 | Discharge: 2016-02-25 | Disposition: A | Discharge status: Home / Self Care | Payer: Worker's Compensation | Attending: Emergency Medicine | Admitting: Emergency Medicine

## 2016-02-25 DIAGNOSIS — I2510 Atherosclerotic heart disease of native coronary artery without angina pectoris: Secondary | ICD-10-CM | POA: Insufficient documentation

## 2016-02-25 DIAGNOSIS — Z7982 Long term (current) use of aspirin: Secondary | ICD-10-CM | POA: Diagnosis not present

## 2016-02-25 DIAGNOSIS — Z7984 Long term (current) use of oral hypoglycemic drugs: Secondary | ICD-10-CM | POA: Insufficient documentation

## 2016-02-25 DIAGNOSIS — Y929 Unspecified place or not applicable: Secondary | ICD-10-CM | POA: Insufficient documentation

## 2016-02-25 DIAGNOSIS — Z79899 Other long term (current) drug therapy: Secondary | ICD-10-CM | POA: Insufficient documentation

## 2016-02-25 DIAGNOSIS — Y990 Civilian activity done for income or pay: Secondary | ICD-10-CM | POA: Insufficient documentation

## 2016-02-25 DIAGNOSIS — S8251XA Displaced fracture of medial malleolus of right tibia, initial encounter for closed fracture: Secondary | ICD-10-CM | POA: Diagnosis not present

## 2016-02-25 DIAGNOSIS — E119 Type 2 diabetes mellitus without complications: Secondary | ICD-10-CM | POA: Insufficient documentation

## 2016-02-25 DIAGNOSIS — Y9389 Activity, other specified: Secondary | ICD-10-CM | POA: Insufficient documentation

## 2016-02-25 DIAGNOSIS — W010XXA Fall on same level from slipping, tripping and stumbling without subsequent striking against object, initial encounter: Secondary | ICD-10-CM | POA: Insufficient documentation

## 2016-02-25 DIAGNOSIS — S8991XA Unspecified injury of right lower leg, initial encounter: Secondary | ICD-10-CM | POA: Diagnosis present

## 2016-02-25 MED ORDER — HYDROCODONE-ACETAMINOPHEN 5-325 MG PO TABS: 1.0000 | ORAL_TABLET | ORAL | 0 refills | Status: DC | PRN

## 2016-02-25 NOTE — ED Notes (Signed)
Pt alert and oriented X4, active, cooperative, pt in NAD. RR even and unlabored, color WNL.  Pt informed to return if any life threatening symptoms occur.   

## 2016-02-25 NOTE — ED Triage Notes (Signed)
Pt tripped at work injured right ankle

## 2016-02-25 NOTE — ED Notes (Signed)
Completed WC urine drug screen and delivered to lab for courier p/u. Also completed BAT

## 2016-02-25 NOTE — ED Notes (Addendum)
Right ankle pain that occurred after trip at work today. Pt works for [employer] and wants to file workers comp.

## 2016-02-25 NOTE — ED Provider Notes (Signed)
[facility] Emergency Department Provider Note   ____________________________________________    I have reviewed the triage vital signs and the nursing notes.   HISTORY  Chief Complaint Ankle Pain     HPI Edward Thompson is a 65 y.o. male who presents with complaints of right ankle pain. Patient reports he tripped at work and injured his right ankle. Primarily he has pain in the medial aspect of the right ankle, describes it as 8 out of 10 sharp pain. Difficulty bearing weight. No other injuries reported. Denies chest pain dizziness palpitations.   Past Medical History:  Diagnosis Date  . CAD (coronary artery disease)   . Diabetes mellitus without complication (HCC)   . Heart attack   . Seizures (HCC)    taking phenobarbitol and dilantin  . Vertigo   . Viral meningitis     Patient Active Problem List   Diagnosis Date Noted  . Dizziness 02/05/2016    Past Surgical History:  Procedure Laterality Date  . CARDIAC SURGERY    . CORONARY ANGIOPLASTY    . HERNIA REPAIR      Prior to Admission medications   Medication Sig Start Date End Date Taking? Authorizing Provider  aspirin EC 81 MG tablet Take 81 mg by mouth daily.    Historical Provider, MD  atorvastatin (LIPITOR) 40 MG tablet Take 1 tablet (40 mg total) by mouth daily at 6 PM. 02/07/16   Enedina Finner, MD  clopidogrel (PLAVIX) 75 MG tablet 1 tablet. 01/28/15   Historical Provider, MD  HYDROcodone-acetaminophen (NORCO/VICODIN) 5-325 MG tablet Take 1 tablet by mouth every 4 (four) hours as needed for moderate pain. 02/25/16   Jene Every, MD  meclizine (ANTIVERT) 25 MG tablet Take 1 tablet by mouth 3 (three) times daily as needed. 05/27/14   Historical Provider, MD  metFORMIN (GLUCOPHAGE) 850 MG tablet Take 1 tablet (850 mg total) by mouth 2 (two) times daily with a meal. 02/07/16   Enedina Finner, MD  metoprolol tartrate (LOPRESSOR) 25 MG tablet Take 1 tablet by mouth 2 (two) times daily. 02/24/15    Historical Provider, MD  ondansetron (ZOFRAN ODT) 4 MG disintegrating tablet Take 1 tablet (4 mg total) by mouth every 8 (eight) hours as needed for nausea or vomiting. 02/05/16   Sharman Cheek, MD  pantoprazole (PROTONIX) 40 MG tablet Take 1 tablet by mouth daily. 09/24/15   Historical Provider, MD  PHENobarbital (LUMINAL) 64.8 MG tablet Take 2 tablets by mouth at bedtime. 05/15/14   Historical Provider, MD  phenytoin (DILANTIN) 100 MG ER capsule 3 capsules at bedtime. 01/28/15   Historical Provider, MD     Allergies Review of patient's allergies indicates no known allergies.  Family History  Problem Relation Age of Onset  . Heart disease Mother   . Heart attack Father     Social History Social History  Substance Use Topics  . Smoking status: Never Smoker  . Smokeless tobacco: Never Used  . Alcohol use No    Review of Systems  Constitutional: No Dizziness  ENT: No neck pain   Gastrointestinal: No abdominal pain.    Musculoskeletal: Negative for back pain. Skin: Negative for abrasion Neurological: Negative for headaches , no neuro deficits    ____________________________________________   PHYSICAL EXAM:  VITAL SIGNS: ED Triage Vitals  Enc Vitals Group     BP 02/25/16 1642 122/65     Pulse Rate 02/25/16 1642 92     Resp 02/25/16 1642 20  Temp 02/25/16 1642 98.4 F (36.9 C)     Temp Source 02/25/16 1642 Oral     SpO2 02/25/16 1642 96 %     Weight 02/25/16 1640 215 lb (97.5 kg)     Height 02/25/16 1640 5\' 4"  (1.626 m)     Head Circumference --      Peak Flow --      Pain Score 02/25/16 1641 8     Pain Loc --      Pain Edu? --      Excl. in GC? --      Constitutional: Alert and oriented. No acute distress.  Head: Atraumatic.  Mouth/Throat: Mucous membranes are moist.   Cardiovascular: Normal rate, regular rhythm.  Respiratory: Normal respiratory effort.  No retractions.  Musculoskeletal: mild tenderness along the medial malleolus of the right  ankle areaswelling. No bruising. 2+ pulses. No other injuries noted. Neurologic:  Normal speech and language. No gross focal neurologic deficits are appreciated.   Skin:  Skin is warm, dry and intact. No rash noted.   ____________________________________________   LABS (all labs ordered are listed, but only abnormal results are displayed)  Labs Reviewed - No data to display ____________________________________________  EKG   ____________________________________________  RADIOLOGY  X-ray shows medial malleolus fracture ____________________________________________   PROCEDURES  Procedure(s) performed: yes  SPLINT APPLICATION Date/Time: 6:30 PM Authorized by: Jene EveryKINNER, Alysah Carton Consent: Verbal consent obtained. Risks and benefits: risks, benefits and alternatives were discussed Consent given by: patient Splint applied by: me Location details: right ankle Splint type: posterior/sugartong Supplies used: orthoglass Post-procedure: The splinted body part was neurovascularly unchanged following the procedure. Patient tolerance: Patient tolerated the procedure well with no immediate complications.       Critical Care performed: No ____________________________________________   INITIAL IMPRESSION / ASSESSMENT AND PLAN / ED COURSE  Pertinent labs & imaging results that were available during my care of the patient were reviewed by me and considered in my medical decision making (see chart for details)  Patient presents after a mechanical fall for right ankle injury. X-ray demonstrates fracture. Splint applied, orthopedic follow-up, walker provided  ____________________________________________   FINAL CLINICAL IMPRESSION(S) / ED DIAGNOSES  Final diagnoses:  Avulsion fracture of medial malleolus of right tibia, closed, initial encounter      NEW MEDICATIONS STARTED DURING THIS VISIT:  New Prescriptions   HYDROCODONE-ACETAMINOPHEN (NORCO/VICODIN) 5-325 MG TABLET     Take 1 tablet by mouth every 4 (four) hours as needed for moderate pain.     Note:  This document was prepared using Dragon voice recognition software and may include unintentional dictation errors.    Jene Everyobert Nataleigh Griffin, MD 02/25/16 [phone]

## 2016-03-15 DIAGNOSIS — S8253XA Displaced fracture of medial malleolus of unspecified tibia, initial encounter for closed fracture: Secondary | ICD-10-CM | POA: Insufficient documentation

## 2017-05-10 DIAGNOSIS — R55 Syncope and collapse: Secondary | ICD-10-CM

## 2017-05-10 HISTORY — DX: Syncope and collapse: R55

## 2017-07-18 ENCOUNTER — Ambulatory Visit (INDEPENDENT_AMBULATORY_CARE_PROVIDER_SITE_OTHER): Payer: Medicare Other | Admitting: Vascular Surgery

## 2017-07-18 ENCOUNTER — Encounter (INDEPENDENT_AMBULATORY_CARE_PROVIDER_SITE_OTHER): Payer: Self-pay | Admitting: Vascular Surgery

## 2017-07-18 DIAGNOSIS — I10 Essential (primary) hypertension: Secondary | ICD-10-CM

## 2017-07-18 DIAGNOSIS — I6529 Occlusion and stenosis of unspecified carotid artery: Secondary | ICD-10-CM | POA: Insufficient documentation

## 2017-07-18 DIAGNOSIS — R55 Syncope and collapse: Secondary | ICD-10-CM

## 2017-07-18 DIAGNOSIS — I6522 Occlusion and stenosis of left carotid artery: Secondary | ICD-10-CM

## 2017-07-18 DIAGNOSIS — E785 Hyperlipidemia, unspecified: Secondary | ICD-10-CM | POA: Insufficient documentation

## 2017-07-18 DIAGNOSIS — E782 Mixed hyperlipidemia: Secondary | ICD-10-CM

## 2017-07-18 NOTE — Progress Notes (Signed)
MRN : [id number]  Edward Thompson is a 67 y.o. (07/17/1950) male who presents with chief complaint of  Chief Complaint  Patient presents with  . New Patient (Initial Visit)    Carotid blockage and dizziness  .  History of Present Illness:   The patient is seen for evaluation of carotid stenosis. The carotid stenosis was identified after a syncopal event.  Previous CT angiogram Finding from CTA neck on 02/06/2016: 1. No acute finding. 2. Atherosclerosis with up to 60% proximal left ICA stenosis. No flow limiting stenosis in the right carotid. 3. Codominant vertebral arteries with moderate left ostial stenosis. Mild-to-moderate bilateral V4 atherosclerotic narrowing.  Follow up CT angiogram done at [facility] now shows >80% LICA  The patient denies amaurosis fugax. There is no recent history of TIA symptoms or focal motor deficits. There is no prior documented CVA.  There is no history of migraine headaches. There is no history of seizures.  The patient is taking enteric-coated aspirin 81 mg daily.  The patient has a history of coronary artery disease, no recent episodes of angina or shortness of breath. The patient denies PAD or claudication symptoms. There is a history of hyperlipidemia which is being treated with a statin.      Current Meds  Medication Sig  . aspirin EC 81 MG tablet Take 81 mg by mouth daily.  . clopidogrel (PLAVIX) 75 MG tablet 1 tablet.  . isosorbide mononitrate (IMDUR) 30 MG 24 hr tablet   . meclizine (ANTIVERT) 25 MG tablet Take 1 tablet by mouth 3 (three) times daily as needed.  . metFORMIN (GLUCOPHAGE) 500 MG tablet   . metoprolol succinate (TOPROL-XL) 25 MG 24 hr tablet 25 mg.   . nitroGLYCERIN (NITROSTAT) 0.4 MG SL tablet Place under the tongue.  . ondansetron (ZOFRAN ODT) 4 MG disintegrating tablet Take 1 tablet (4 mg total) by mouth every 8 (eight) hours as needed for nausea or vomiting.  . pantoprazole (PROTONIX) 20 MG tablet   .  PHENobarbital (LUMINAL) 64.8 MG tablet Take 2 tablets by mouth at bedtime.  . phenytoin (DILANTIN) 100 MG ER capsule Take by mouth daily.  . rosuvastatin (CRESTOR) 40 MG tablet rosuvastatin 40 mg tablet  . terbinafine (LAMISIL) 250 MG tablet Take 250 mg by mouth daily.    Past Medical History:  Diagnosis Date  . CAD (coronary artery disease)   . Diabetes mellitus without complication (HCC)   . Heart attack (HCC)   . Hypertension   . Myocardial infarction acute (HCC)   . Seizures (HCC)    taking phenobarbitol and dilantin  . Vertigo   . Viral meningitis     Past Surgical History:  Procedure Laterality Date  . CARDIAC SURGERY    . CORONARY ANGIOPLASTY    . HERNIA REPAIR      Social History Social History   Tobacco Use  . Smoking status: Never Smoker  . Smokeless tobacco: Never Used  Substance Use Topics  . Alcohol use: No  . Drug use: No    Family History Family History  Problem Relation Age of Onset  . Heart disease Mother   . Heart attack Father   No family history of bleeding/clotting disorders, porphyria or autoimmune disease   No Known Allergies   REVIEW OF SYSTEMS (Negative unless checked)  Constitutional: [] Weight loss  [] Fever  [] Chills Cardiac: [] Chest pain   [] Chest pressure   [] Palpitations   [] Shortness of breath when laying flat   [] Shortness of breath  with exertion. Vascular:  [] Pain in legs with walking   [] Pain in legs at rest  [] History of DVT   [] Phlebitis   [] Swelling in legs   [] Varicose veins   [] Non-healing ulcers Pulmonary:   [] Uses home oxygen   [] Productive cough   [] Hemoptysis   [] Wheeze  [] COPD   [] Asthma Neurologic:  [x] Dizziness   [] Seizures   [] History of stroke   [] History of TIA  [] Aphasia   [x] Vissual changes   [] Weakness or numbness in arm   [] Weakness or numbness in leg Musculoskeletal:   [] Joint swelling   [] Joint pain   [] Low back pain Hematologic:  [] Easy bruising  [] Easy bleeding   [] Hypercoagulable state    [] Anemic Gastrointestinal:  [] Diarrhea   [] Vomiting  [] Gastroesophageal reflux/heartburn   [] Difficulty swallowing. Genitourinary:  [] Chronic kidney disease   [] Difficult urination  [] Frequent urination   [] Blood in urine Skin:  [] Rashes   [] Ulcers  Psychological:  [] History of anxiety   []  History of major depression.  Physical Examination  Vitals:   07/18/17 0933  BP: 130/76  Pulse: 83  Resp: 17  Weight: 251 lb (113.9 kg)  Height: 5\' 4"  (1.626 m)   Body mass index is 43.08 kg/m. Gen: WD/WN, NAD Head: [place]/AT, No temporalis wasting.  Ear/Nose/Throat: Hearing grossly intact, nares w/o erythema or drainage, poor dentition Eyes: PER, EOMI, sclera nonicteric.  Neck: Supple, no masses.  No bruit or JVD.  Pulmonary:  Good air movement, clear to auscultation bilaterally, no use of accessory muscles.  Cardiac: RRR, normal S1, S2, no Murmurs. Vascular:  Left carotid bruit Vessel Right Left  Radial Palpable Palpable  Ulnar Palpable Palpable  Brachial Palpable Palpable  Carotid Palpable Palpable  Gastrointestinal: soft, non-distended. No guarding/no peritoneal signs.  Musculoskeletal: M/S 5/5 throughout.  No deformity or atrophy.  Neurologic: CN 2-12 intact. Pain and light touch intact in extremities.  Symmetrical.  Speech is fluent. Motor exam as listed above. Psychiatric: Judgment intact, Mood & affect appropriate for pt's clinical situation. Dermatologic: No rashes or ulcers noted.  No changes consistent with cellulitis. Lymph : No Cervical lymphadenopathy, no lichenification or skin changes of chronic lymphedema.  CBC Lab Results  Component Value Date   WBC 6.5 02/05/2016   HGB 14.2 02/05/2016   HCT 39.9 (L) 02/05/2016   MCV 85.9 02/05/2016   PLT 170 02/05/2016    BMET    Component Value Date/Time   NA 135 02/06/2016 0419   NA 139 09/22/2013 1832   K 3.8 02/06/2016 0419   K 3.8 09/22/2013 1832   CL 105 02/06/2016 0419   CL 106 09/22/2013 1832   CO2 22 02/06/2016 0419    CO2 25 09/22/2013 1832   GLUCOSE 226 (H) 02/06/2016 0419   GLUCOSE 162 (H) 09/22/2013 1832   BUN 12 02/06/2016 0419   BUN 16 09/22/2013 1832   CREATININE 0.59 (L) 02/06/2016 0419   CREATININE 0.91 09/22/2013 1832   CALCIUM 8.7 (L) 02/06/2016 0419   CALCIUM 8.5 09/22/2013 1832   GFRNONAA >60 02/06/2016 0419   GFRNONAA >60 09/22/2013 1832   GFRAA >60 02/06/2016 0419   GFRAA >60 09/22/2013 1832   CrCl cannot be calculated (Patient's most recent lab result is older than the maximum 21 days allowed.).  COAG Lab Results  Component Value Date   INR 1.08 02/05/2016   INR 1.1 09/22/2013   INR 1.1 08/18/2013    Radiology No results found.   Assessment/Plan 1. Syncope, unspecified syncope type Recommend:  The patient is symptomatic  with respect to the carotid stenosis.  and the patient has now progressed and has a lesion the is >80%.  Patient's CT angiography of the carotid arteries confirms >80% LICA stenosis.  I will request a DVD of the CT angiogram to plan for surgical repair.  The patient does indeed need surgery, therefore, cardiac clearance will be arranged. Once cleared the patient will be scheduled for surgery.  The risks, benefits and alternative therapies were reviewed in detail with the patient.  All questions were answered.  The patient agrees to proceed with surgery of the left carotid artery.  Continue antiplatelet therapy as prescribed. Continue management of CAD, HTN and Hyperlipidemia. Healthy heart diet, encouraged exercise at least 4 times per week.    2. Stenosis of left carotid artery Se #1  3. Mixed hyperlipidemia Continue statin as ordered and reviewed, no changes at this time   4. Essential hypertension Continue antihypertensive medications as already ordered, these medications have been reviewed and there are no changes at this time.      Levora Dredge, MD  07/18/2017 10:02 AM

## 2017-08-04 ENCOUNTER — Encounter (INDEPENDENT_AMBULATORY_CARE_PROVIDER_SITE_OTHER): Payer: Self-pay

## 2017-08-08 ENCOUNTER — Other Ambulatory Visit (INDEPENDENT_AMBULATORY_CARE_PROVIDER_SITE_OTHER): Payer: Self-pay | Admitting: Vascular Surgery

## 2017-08-08 ENCOUNTER — Encounter (INDEPENDENT_AMBULATORY_CARE_PROVIDER_SITE_OTHER): Payer: Self-pay

## 2017-08-08 DIAGNOSIS — I6522 Occlusion and stenosis of left carotid artery: Secondary | ICD-10-CM

## 2017-08-08 HISTORY — DX: Occlusion and stenosis of left carotid artery: I65.22

## 2017-08-12 ENCOUNTER — Other Ambulatory Visit: Payer: Self-pay

## 2017-08-12 ENCOUNTER — Encounter
Admission: RE | Admit: 2017-08-12 | Discharge: 2017-08-12 | Disposition: A | Discharge status: Home / Self Care | Payer: Medicare Other | Source: Ambulatory Visit | Attending: Vascular Surgery | Admitting: Vascular Surgery

## 2017-08-12 DIAGNOSIS — Z7902 Long term (current) use of antithrombotics/antiplatelets: Secondary | ICD-10-CM | POA: Insufficient documentation

## 2017-08-12 DIAGNOSIS — Z7984 Long term (current) use of oral hypoglycemic drugs: Secondary | ICD-10-CM | POA: Diagnosis not present

## 2017-08-12 DIAGNOSIS — Z01818 Encounter for other preprocedural examination: Secondary | ICD-10-CM | POA: Insufficient documentation

## 2017-08-12 DIAGNOSIS — Z79899 Other long term (current) drug therapy: Secondary | ICD-10-CM | POA: Insufficient documentation

## 2017-08-12 DIAGNOSIS — Z01810 Encounter for preprocedural cardiovascular examination: Secondary | ICD-10-CM | POA: Insufficient documentation

## 2017-08-12 DIAGNOSIS — Z7982 Long term (current) use of aspirin: Secondary | ICD-10-CM | POA: Insufficient documentation

## 2017-08-12 DIAGNOSIS — I6522 Occlusion and stenosis of left carotid artery: Secondary | ICD-10-CM | POA: Diagnosis not present

## 2017-08-12 HISTORY — DX: Occlusion and stenosis of left carotid artery: I65.22

## 2017-08-12 HISTORY — DX: Sleep apnea, unspecified: G47.30

## 2017-08-12 HISTORY — DX: Hyperlipidemia, unspecified: E78.5

## 2017-08-12 HISTORY — DX: Syncope and collapse: R55

## 2017-08-12 HISTORY — DX: Fracture of neck, unspecified, initial encounter: S12.9XXA

## 2017-08-12 LAB — CBC WITH DIFFERENTIAL/PLATELET
BASOS PCT: 0 %
Basophils Absolute: 0 10*3/uL (ref 0–0.1)
EOS ABS: 0.3 10*3/uL (ref 0–0.7)
Eosinophils Relative: 4 %
HEMATOCRIT: 41.6 % (ref 40.0–52.0)
Hemoglobin: 13.9 g/dL (ref 13.0–18.0)
LYMPHS ABS: 1.6 10*3/uL (ref 1.0–3.6)
Lymphocytes Relative: 19 %
MCH: 30.3 pg (ref 26.0–34.0)
MCHC: 33.3 g/dL (ref 32.0–36.0)
MCV: 90.9 fL (ref 80.0–100.0)
MONO ABS: 0.8 10*3/uL (ref 0.2–1.0)
MONOS PCT: 9 %
Neutro Abs: 5.7 10*3/uL (ref 1.4–6.5)
Neutrophils Relative %: 68 %
Platelets: 187 10*3/uL (ref 150–440)
RBC: 4.57 MIL/uL (ref 4.40–5.90)
RDW: 14 % (ref 11.5–14.5)
WBC: 8.5 10*3/uL (ref 3.8–10.6)

## 2017-08-12 LAB — BASIC METABOLIC PANEL
ANION GAP: 12 (ref 5–15)
BUN: 15 mg/dL (ref 6–20)
CHLORIDE: 103 mmol/L (ref 101–111)
CO2: 21 mmol/L — AB (ref 22–32)
Calcium: 9.1 mg/dL (ref 8.9–10.3)
Creatinine, Ser: 0.7 mg/dL (ref 0.61–1.24)
GFR calc Af Amer: >60 mL/min (ref >60)
GLUCOSE: 120 mg/dL — AB (ref 65–99)
POTASSIUM: 4.2 mmol/L (ref 3.5–5.1)
Sodium: 136 mmol/L (ref 135–145)

## 2017-08-12 LAB — TYPE AND SCREEN
ABO/RH(D): O POS
ANTIBODY SCREEN: NEGATIVE

## 2017-08-12 LAB — APTT: APTT: 32 s (ref 24–36)

## 2017-08-12 LAB — SURGICAL PCR SCREEN
MRSA, PCR: NEGATIVE
STAPHYLOCOCCUS AUREUS: NEGATIVE

## 2017-08-12 LAB — PROTIME-INR
INR: 1.11
Prothrombin Time: 14.2 seconds (ref 11.4–15.2)

## 2017-08-12 NOTE — Pre-Procedure Instructions (Addendum)
Patient was seen in pre-admit testing today for his preop evaluation.  Patient was very drowsy during the interview and tended to fall asleep easily between questions and even when having ekg/blood work drawn.  He is slow to find the words to answer questions but does speak clearly when he does so.  Also, patient plans on driving to the hospital and driving himself home again upon discharge the day after surgery.  I very strongly suggested that he be in contact with his daughter to be here for discharge information and to get him back to his private home.  States he does not have any help at home as his daughter is a Engineer, site.  He may need assistance once home with cooking, cleaning, etc.  Patient also saw Dr. Evlyn Courier this past week who told the patient that his stents were stenosed at 60% and 70% but since he was asymptomatic that he was fine for surgery. Will request cardiac clearance in writing today 08/12/2017.  Daughter Satira Mccallum can be contacted at [phone]

## 2017-08-12 NOTE — Patient Instructions (Signed)
Your procedure is scheduled on: Friday, April 12th  Report to [facility] SECOND [facility]LOOR  To find out your arrival time please call [phone] between             1PM - 3PM on Thursday, April 11th  Remember: Instructions that are not followed completely may result in  serious medical risk, up to and including death, or upon the discretion of your  surgeon and anesthesiologist your surgery may need to be rescheduled.     _X__ 1. Do not eat food after midnight the night before your procedure.                 No gum chewing or hard candies.                  You may drink clear liquids up to 2 hours before you are scheduled to arrive for your surgery-                  DO not drink clear liquids within 2 hours of the start of your surgery.                  Clear Liquids include:  water, apple juice without pulp, clear carbohydrate                 drink such as Clearfast of Gartorade, Black Coffee or Tea (Do not add                 anything to coffee or tea).  __X__2.  On the morning of surgery brush your teeth with toothpaste and water,                     you may rinse your mouth with mouthwash if you wish.                            Do not swallow any toothpaste of mouthwash.     _X__ 3.  No Alcohol for 24 hours before or after surgery.   _X__ 4.  Do Not Smoke or use e-cigarettes For 24 Hours Prior to Your Surgery.                 Do not use any chewable tobacco products for at least 6 hours prior to                 surgery.  ____  5.  Bring all medications with you on the day of surgery if instructed.   _x___  6.  Notify your doctor if there is any change in your medical condition      (cold, fever, infections).     Do not wear jewelry, make-up, hairpins, clips or nail polish. Do not wear lotions, powders, or perfumes. You may wear deodorant. Do not shave 48 hours prior to surgery. Men may shave face and neck. Do not bring valuables to the hospital.     [facility] is not responsible for any belongings or valuables.  Contacts, dentures or bridgework may not be worn into surgery. Leave your suitcase in the car. After surgery it may be brought to your room. For patients admitted to the hospital, discharge time is determined by your treatment team.   Patients discharged the day of surgery will not be allowed to drive home.   Please read over the following fact sheets that you were given:  PREPARING  FOR SURGERY MRSA: STOP THE SPREAD    ____ Take these medicines the morning of surgery with A SIP OF WATER:    1. ASPIRIN  2. METOPROLOL  3. MECLIZINE  4.  5.  6.  ____ Fleet Enema (as directed)   __X__ Use CHG Soap as directed  _X___ Stop metformin 2 days prior to surgery. LAST DOSE ON April 9, TUESDAY    _X___ Stop PLAVIX 5 DAYS PRIOR TO SURGERY. LAST DOSE ONSATURDAY, April 6                 CONTINUE TAKING YOUR ASPIRIN EVERY DAY   ____ Stop supplements until after surgery.    _X___ Bring C-Pap to the hospital.   CONTINUE TAKING ALL PRESCRIBED MEDICATIONS AS USUAL.  THE ONLY MEDICATIONS TO STOP WOULD BE PLAVIX AND METFORMIN.  DO NOT STOP THE ASPIRIN.

## 2017-08-18 MED ORDER — CEFAZOLIN SODIUM-DEXTROSE 2-4 GM/100ML-% IV SOLN
2.0000 g | INTRAVENOUS | Status: AC
  Administered 2017-08-19: 2 g via INTRAVENOUS
  Filled 2017-08-18: qty 100

## 2017-08-19 ENCOUNTER — Inpatient Hospital Stay: Payer: Medicare Other | Admitting: Certified Registered"

## 2017-08-19 ENCOUNTER — Encounter: Payer: Self-pay | Admitting: *Deleted

## 2017-08-19 ENCOUNTER — Other Ambulatory Visit: Payer: Self-pay

## 2017-08-19 ENCOUNTER — Inpatient Hospital Stay
Admission: RE | Admit: 2017-08-19 | Discharge: 2017-08-25 | DRG: 038 | Disposition: A | Discharge status: Skilled Nursing Facility | Payer: Medicare Other | Source: Ambulatory Visit | Attending: Vascular Surgery | Admitting: Vascular Surgery

## 2017-08-19 ENCOUNTER — Encounter
Admission: RE | Disposition: A | Discharge status: Skilled Nursing Facility | Payer: Self-pay | Source: Ambulatory Visit | Attending: Vascular Surgery

## 2017-08-19 DIAGNOSIS — S0450XD Injury of facial nerve, unspecified side, subsequent encounter: Secondary | ICD-10-CM

## 2017-08-19 DIAGNOSIS — I6621 Occlusion and stenosis of right posterior cerebral artery: Secondary | ICD-10-CM | POA: Diagnosis present

## 2017-08-19 DIAGNOSIS — X58XXXD Exposure to other specified factors, subsequent encounter: Secondary | ICD-10-CM | POA: Diagnosis present

## 2017-08-19 DIAGNOSIS — J9811 Atelectasis: Secondary | ICD-10-CM | POA: Diagnosis not present

## 2017-08-19 DIAGNOSIS — Z9181 History of falling: Secondary | ICD-10-CM | POA: Diagnosis not present

## 2017-08-19 DIAGNOSIS — I6522 Occlusion and stenosis of left carotid artery: Secondary | ICD-10-CM | POA: Diagnosis present

## 2017-08-19 DIAGNOSIS — Z79899 Other long term (current) drug therapy: Secondary | ICD-10-CM | POA: Diagnosis not present

## 2017-08-19 DIAGNOSIS — Z7902 Long term (current) use of antithrombotics/antiplatelets: Secondary | ICD-10-CM | POA: Diagnosis not present

## 2017-08-19 DIAGNOSIS — E119 Type 2 diabetes mellitus without complications: Secondary | ICD-10-CM | POA: Diagnosis present

## 2017-08-19 DIAGNOSIS — R296 Repeated falls: Secondary | ICD-10-CM | POA: Diagnosis present

## 2017-08-19 DIAGNOSIS — H538 Other visual disturbances: Secondary | ICD-10-CM | POA: Diagnosis not present

## 2017-08-19 DIAGNOSIS — Z23 Encounter for immunization: Secondary | ICD-10-CM

## 2017-08-19 DIAGNOSIS — I2510 Atherosclerotic heart disease of native coronary artery without angina pectoris: Secondary | ICD-10-CM | POA: Diagnosis present

## 2017-08-19 DIAGNOSIS — G4730 Sleep apnea, unspecified: Secondary | ICD-10-CM | POA: Diagnosis present

## 2017-08-19 DIAGNOSIS — G40909 Epilepsy, unspecified, not intractable, without status epilepticus: Secondary | ICD-10-CM | POA: Diagnosis present

## 2017-08-19 DIAGNOSIS — R410 Disorientation, unspecified: Secondary | ICD-10-CM

## 2017-08-19 DIAGNOSIS — H539 Unspecified visual disturbance: Secondary | ICD-10-CM | POA: Diagnosis not present

## 2017-08-19 DIAGNOSIS — I252 Old myocardial infarction: Secondary | ICD-10-CM

## 2017-08-19 DIAGNOSIS — Z7984 Long term (current) use of oral hypoglycemic drugs: Secondary | ICD-10-CM

## 2017-08-19 DIAGNOSIS — Z7982 Long term (current) use of aspirin: Secondary | ICD-10-CM

## 2017-08-19 DIAGNOSIS — I6529 Occlusion and stenosis of unspecified carotid artery: Secondary | ICD-10-CM | POA: Diagnosis present

## 2017-08-19 DIAGNOSIS — Z9989 Dependence on other enabling machines and devices: Secondary | ICD-10-CM | POA: Diagnosis not present

## 2017-08-19 DIAGNOSIS — E782 Mixed hyperlipidemia: Secondary | ICD-10-CM | POA: Diagnosis present

## 2017-08-19 DIAGNOSIS — Z951 Presence of aortocoronary bypass graft: Secondary | ICD-10-CM

## 2017-08-19 DIAGNOSIS — I10 Essential (primary) hypertension: Secondary | ICD-10-CM | POA: Diagnosis present

## 2017-08-19 DIAGNOSIS — Z8249 Family history of ischemic heart disease and other diseases of the circulatory system: Secondary | ICD-10-CM

## 2017-08-19 HISTORY — PX: ENDARTERECTOMY: SHX5162

## 2017-08-19 LAB — GLUCOSE, CAPILLARY
GLUCOSE-CAPILLARY: 134 mg/dL — AB (ref 65–99)
Glucose-Capillary: 124 mg/dL — ABNORMAL HIGH (ref 65–99)
Glucose-Capillary: 126 mg/dL — ABNORMAL HIGH (ref 65–99)
Glucose-Capillary: 123 mg/dL — ABNORMAL HIGH (ref 65–99)
Glucose-Capillary: 122 mg/dL — ABNORMAL HIGH (ref 65–99)

## 2017-08-19 LAB — ABO/RH: ABO/RH(D): O POS

## 2017-08-19 SURGERY — ENDARTERECTOMY, CAROTID
Anesthesia: General | Laterality: Left | Wound class: Clean

## 2017-08-19 MED ORDER — ROCURONIUM BROMIDE 100 MG/10ML IV SOLN
INTRAVENOUS | Status: DC | PRN
  Administered 2017-08-19: 50 mg via INTRAVENOUS
  Administered 2017-08-19: 45 mg via INTRAVENOUS
  Administered 2017-08-19: 5 mg via INTRAVENOUS

## 2017-08-19 MED ORDER — DOCUSATE SODIUM 100 MG PO CAPS
100.0000 mg | ORAL_CAPSULE | Freq: Every day | ORAL | Status: DC
  Administered 2017-08-20 – 2017-08-25 (×5): 100 mg via ORAL
  Filled 2017-08-19 (×6): qty 1

## 2017-08-19 MED ORDER — CEFAZOLIN SODIUM-DEXTROSE 2-3 GM-%(50ML) IV SOLR
INTRAVENOUS | Status: AC
  Filled 2017-08-19: qty 50

## 2017-08-19 MED ORDER — SUCCINYLCHOLINE CHLORIDE 20 MG/ML IJ SOLN
INTRAMUSCULAR | Status: DC | PRN
  Administered 2017-08-19: 100 mg via INTRAVENOUS

## 2017-08-19 MED ORDER — MORPHINE SULFATE (PF) 2 MG/ML IV SOLN
2.0000 mg | INTRAVENOUS | Status: DC | PRN
  Administered 2017-08-19 (×2): 2 mg via INTRAVENOUS
  Administered 2017-08-20: 3 mg via INTRAVENOUS
  Filled 2017-08-19: qty 2
  Filled 2017-08-19 (×2): qty 1

## 2017-08-19 MED ORDER — OXYCODONE-ACETAMINOPHEN 5-325 MG PO TABS
1.0000 | ORAL_TABLET | ORAL | Status: DC | PRN
  Administered 2017-08-22 (×2): 1 via ORAL
  Administered 2017-08-23: 2 via ORAL
  Filled 2017-08-19: qty 2
  Filled 2017-08-19: qty 1
  Filled 2017-08-19: qty 2

## 2017-08-19 MED ORDER — PHENYTOIN SODIUM EXTENDED 100 MG PO CAPS
300.0000 mg | ORAL_CAPSULE | Freq: Every day | ORAL | Status: DC
  Administered 2017-08-19 – 2017-08-24 (×6): 300 mg via ORAL
  Filled 2017-08-19 (×7): qty 3

## 2017-08-19 MED ORDER — ISOSORBIDE MONONITRATE ER 30 MG PO TB24
30.0000 mg | ORAL_TABLET | Freq: Every day | ORAL | Status: DC
  Administered 2017-08-19 – 2017-08-24 (×6): 30 mg via ORAL
  Filled 2017-08-19 (×6): qty 1

## 2017-08-19 MED ORDER — PHENYLEPHRINE HCL 10 MG/ML IJ SOLN
INTRAMUSCULAR | Status: DC | PRN
Start: 2017-08-19 — End: 2017-08-19
  Administered 2017-08-19: 100 ug via INTRAVENOUS

## 2017-08-19 MED ORDER — METOPROLOL SUCCINATE ER 50 MG PO TB24
50.0000 mg | ORAL_TABLET | Freq: Every day | ORAL | Status: AC
  Administered 2017-08-19: 50 mg via ORAL
  Filled 2017-08-19: qty 1

## 2017-08-19 MED ORDER — METOPROLOL SUCCINATE ER 50 MG PO TB24
50.0000 mg | ORAL_TABLET | Freq: Every day | ORAL | Status: DC
  Administered 2017-08-20 – 2017-08-25 (×6): 50 mg via ORAL
  Filled 2017-08-19 (×7): qty 1

## 2017-08-19 MED ORDER — NITROGLYCERIN 0.4 MG SL SUBL: 0.4000 mg | SUBLINGUAL_TABLET | SUBLINGUAL | Status: DC | PRN

## 2017-08-19 MED ORDER — EPHEDRINE SULFATE 50 MG/ML IJ SOLN
INTRAMUSCULAR | Status: DC | PRN
  Administered 2017-08-19 (×5): 5 mg via INTRAVENOUS

## 2017-08-19 MED ORDER — EPHEDRINE SULFATE 50 MG/ML IJ SOLN
INTRAMUSCULAR | Status: AC
  Filled 2017-08-19: qty 1

## 2017-08-19 MED ORDER — METOPROLOL TARTRATE 5 MG/5ML IV SOLN: 2.0000 mg | INTRAVENOUS | Status: DC | PRN

## 2017-08-19 MED ORDER — ONDANSETRON HCL 4 MG/2ML IJ SOLN: 4.0000 mg | Freq: Four times a day (QID) | INTRAMUSCULAR | Status: DC | PRN

## 2017-08-19 MED ORDER — FENTANYL CITRATE (PF) 100 MCG/2ML IJ SOLN
INTRAMUSCULAR | Status: AC
  Filled 2017-08-19: qty 2

## 2017-08-19 MED ORDER — PNEUMOCOCCAL VAC POLYVALENT 25 MCG/0.5ML IJ INJ
0.5000 mL | INJECTION | INTRAMUSCULAR | Status: AC
  Administered 2017-08-20: 0.5 mL via INTRAMUSCULAR
  Filled 2017-08-19: qty 0.5

## 2017-08-19 MED ORDER — PHENOBARBITAL 32.4 MG PO TABS
129.6000 mg | ORAL_TABLET | Freq: Every day | ORAL | Status: DC
  Administered 2017-08-20 – 2017-08-24 (×6): 129.6 mg via ORAL
  Filled 2017-08-19 (×7): qty 4

## 2017-08-19 MED ORDER — INSULIN ASPART 100 UNIT/ML ~~LOC~~ SOLN: 0.0000 [IU] | Freq: Three times a day (TID) | SUBCUTANEOUS | Status: DC

## 2017-08-19 MED ORDER — PANTOPRAZOLE SODIUM 40 MG IV SOLR
40.0000 mg | Freq: Every day | INTRAVENOUS | Status: DC
  Administered 2017-08-19 – 2017-08-20 (×2): 40 mg via INTRAVENOUS
  Filled 2017-08-19 (×2): qty 40

## 2017-08-19 MED ORDER — SUGAMMADEX SODIUM 200 MG/2ML IV SOLN
INTRAVENOUS | Status: DC | PRN
  Administered 2017-08-19: 200 mg via INTRAVENOUS

## 2017-08-19 MED ORDER — ROSUVASTATIN CALCIUM 20 MG PO TABS
40.0000 mg | ORAL_TABLET | Freq: Every day | ORAL | Status: DC
  Administered 2017-08-19 – 2017-08-24 (×6): 40 mg via ORAL
  Filled 2017-08-19 (×2): qty 2
  Filled 2017-08-19: qty 4
  Filled 2017-08-19: qty 2
  Filled 2017-08-19: qty 4
  Filled 2017-08-19 (×2): qty 2
  Filled 2017-08-19 (×2): qty 4
  Filled 2017-08-19 (×2): qty 2
  Filled 2017-08-19: qty 1

## 2017-08-19 MED ORDER — MECLIZINE HCL 25 MG PO TABS
50.0000 mg | ORAL_TABLET | Freq: Two times a day (BID) | ORAL | Status: DC
  Administered 2017-08-19 – 2017-08-25 (×13): 50 mg via ORAL
  Filled 2017-08-19 (×14): qty 2

## 2017-08-19 MED ORDER — PROPOFOL 10 MG/ML IV BOLUS
INTRAVENOUS | Status: AC
  Filled 2017-08-19: qty 20

## 2017-08-19 MED ORDER — KCL IN DEXTROSE-NACL 20-5-0.9 MEQ/L-%-% IV SOLN
INTRAVENOUS | Status: DC
  Administered 2017-08-19: 75 mL/h via INTRAVENOUS
  Filled 2017-08-19 (×3): qty 1000

## 2017-08-19 MED ORDER — GLYCOPYRROLATE 0.2 MG/ML IJ SOLN
INTRAMUSCULAR | Status: DC | PRN
  Administered 2017-08-19: 0.2 mg via INTRAVENOUS

## 2017-08-19 MED ORDER — PHENOL 1.4 % MT LIQD
1.0000 | OROMUCOSAL | Status: DC | PRN
  Administered 2017-08-19 – 2017-08-22 (×2): 1 via OROMUCOSAL
  Filled 2017-08-19: qty 177

## 2017-08-19 MED ORDER — SODIUM CHLORIDE 0.9 % IV SOLN
INTRAVENOUS | Status: DC | PRN
  Administered 2017-08-19: 10 ug/min via INTRAVENOUS

## 2017-08-19 MED ORDER — ONDANSETRON HCL 4 MG/2ML IJ SOLN
INTRAMUSCULAR | Status: DC | PRN
  Administered 2017-08-19: 4 mg via INTRAVENOUS

## 2017-08-19 MED ORDER — ONDANSETRON HCL 4 MG/2ML IJ SOLN
INTRAMUSCULAR | Status: AC
  Filled 2017-08-19: qty 2

## 2017-08-19 MED ORDER — PHENYLEPHRINE HCL 10 MG/ML IJ SOLN
INTRAMUSCULAR | Status: AC
  Filled 2017-08-19: qty 1

## 2017-08-19 MED ORDER — MIDAZOLAM HCL 2 MG/2ML IJ SOLN
INTRAMUSCULAR | Status: AC
  Filled 2017-08-19: qty 2

## 2017-08-19 MED ORDER — LIDOCAINE HCL (PF) 2 % IJ SOLN
INTRAMUSCULAR | Status: AC
  Filled 2017-08-19: qty 10

## 2017-08-19 MED ORDER — ACETAMINOPHEN 325 MG PO TABS: 325.0000 mg | ORAL_TABLET | ORAL | Status: DC | PRN

## 2017-08-19 MED ORDER — HEPARIN SODIUM (PORCINE) 1000 UNIT/ML IJ SOLN
INTRAMUSCULAR | Status: DC | PRN
  Administered 2017-08-19: 7000 [IU] via INTRAVENOUS

## 2017-08-19 MED ORDER — ASPIRIN EC 81 MG PO TBEC
81.0000 mg | DELAYED_RELEASE_TABLET | Freq: Every day | ORAL | Status: DC
  Administered 2017-08-19 – 2017-08-21 (×3): 81 mg via ORAL
  Filled 2017-08-19 (×3): qty 1

## 2017-08-19 MED ORDER — NITROGLYCERIN IN D5W 200-5 MCG/ML-% IV SOLN
INTRAVENOUS | Status: AC
  Filled 2017-08-19: qty 250

## 2017-08-19 MED ORDER — ROCURONIUM BROMIDE 50 MG/5ML IV SOLN
INTRAVENOUS | Status: AC
  Filled 2017-08-19: qty 1

## 2017-08-19 MED ORDER — HEPARIN SODIUM (PORCINE) 1000 UNIT/ML IJ SOLN
INTRAMUSCULAR | Status: AC
  Filled 2017-08-19: qty 1

## 2017-08-19 MED ORDER — HYDRALAZINE HCL 20 MG/ML IJ SOLN: 5.0000 mg | INTRAMUSCULAR | Status: DC | PRN

## 2017-08-19 MED ORDER — ONDANSETRON HCL 4 MG/2ML IJ SOLN: 4.0000 mg | Freq: Once | INTRAMUSCULAR | Status: DC | PRN

## 2017-08-19 MED ORDER — CEFAZOLIN SODIUM-DEXTROSE 2-4 GM/100ML-% IV SOLN
2.0000 g | Freq: Three times a day (TID) | INTRAVENOUS | Status: AC
  Administered 2017-08-19 (×2): 2 g via INTRAVENOUS
  Filled 2017-08-19 (×2): qty 100

## 2017-08-19 MED ORDER — FENTANYL CITRATE (PF) 100 MCG/2ML IJ SOLN
INTRAMUSCULAR | Status: DC | PRN
  Administered 2017-08-19: 100 ug via INTRAVENOUS
  Administered 2017-08-19: 50 ug via INTRAVENOUS

## 2017-08-19 MED ORDER — HEPARIN SODIUM (PORCINE) 5000 UNIT/ML IJ SOLN
INTRAMUSCULAR | Status: AC
  Filled 2017-08-19: qty 1

## 2017-08-19 MED ORDER — ACETAMINOPHEN 650 MG RE SUPP: 325.0000 mg | RECTAL | Status: DC | PRN

## 2017-08-19 MED ORDER — SODIUM CHLORIDE 0.9 % IV SOLN
INTRAVENOUS | Status: DC
  Administered 2017-08-19: 07:00:00 via INTRAVENOUS

## 2017-08-19 MED ORDER — GUAIFENESIN-DM 100-10 MG/5ML PO SYRP
15.0000 mL | ORAL_SOLUTION | ORAL | Status: DC | PRN
  Administered 2017-08-21 – 2017-08-22 (×2): 15 mL via ORAL
  Filled 2017-08-19 (×2): qty 15

## 2017-08-19 MED ORDER — LIDOCAINE HCL (PF) 1 % IJ SOLN
INTRAMUSCULAR | Status: AC
  Filled 2017-08-19: qty 30

## 2017-08-19 MED ORDER — SUCCINYLCHOLINE CHLORIDE 20 MG/ML IJ SOLN
INTRAMUSCULAR | Status: AC
  Filled 2017-08-19: qty 1

## 2017-08-19 MED ORDER — FENTANYL CITRATE (PF) 100 MCG/2ML IJ SOLN: 25.0000 ug | INTRAMUSCULAR | Status: DC | PRN

## 2017-08-19 MED ORDER — LABETALOL HCL 5 MG/ML IV SOLN: 10.0000 mg | INTRAVENOUS | Status: DC | PRN

## 2017-08-19 MED ORDER — ALUM & MAG HYDROXIDE-SIMETH 200-200-20 MG/5ML PO SUSP
15.0000 mL | ORAL | Status: DC | PRN
  Filled 2017-08-19: qty 30

## 2017-08-19 MED ORDER — SUGAMMADEX SODIUM 200 MG/2ML IV SOLN
INTRAVENOUS | Status: AC
  Filled 2017-08-19: qty 2

## 2017-08-19 MED ORDER — CHLORHEXIDINE GLUCONATE CLOTH 2 % EX PADS: 6.0000 | MEDICATED_PAD | Freq: Once | CUTANEOUS | Status: DC

## 2017-08-19 MED ORDER — PROPOFOL 10 MG/ML IV BOLUS
INTRAVENOUS | Status: DC | PRN
  Administered 2017-08-19: 130 mg via INTRAVENOUS

## 2017-08-19 MED ORDER — FAMOTIDINE 20 MG PO TABS
20.0000 mg | ORAL_TABLET | Freq: Once | ORAL | Status: AC
  Administered 2017-08-19: 20 mg via ORAL

## 2017-08-19 MED ORDER — LIDOCAINE HCL (CARDIAC) 20 MG/ML IV SOLN
INTRAVENOUS | Status: DC | PRN
  Administered 2017-08-19: 100 mg via INTRAVENOUS

## 2017-08-19 MED ORDER — METFORMIN HCL 500 MG PO TABS
500.0000 mg | ORAL_TABLET | Freq: Every day | ORAL | Status: DC
  Administered 2017-08-19 – 2017-08-22 (×3): 500 mg via ORAL
  Filled 2017-08-19 (×5): qty 1

## 2017-08-19 MED ORDER — FAMOTIDINE 20 MG PO TABS
ORAL_TABLET | ORAL | Status: AC
  Administered 2017-08-19: 20 mg via ORAL
  Filled 2017-08-19: qty 1

## 2017-08-19 MED ORDER — SODIUM CHLORIDE 0.9 % IV SOLN: 500.0000 mL | Freq: Once | INTRAVENOUS | Status: DC | PRN

## 2017-08-19 SURGICAL SUPPLY — 65 items
APPLIER CLIP 11 MED OPEN (CLIP)
APPLIER CLIP 9.375 SM OPEN (CLIP)
BAG DECANTER FOR FLEXI CONT (MISCELLANEOUS) ×3 IMPLANT
BLADE SURG 15 STRL LF DISP TIS (BLADE) ×1 IMPLANT
BLADE SURG 15 STRL SS (BLADE) ×2
BLADE SURG SZ11 CARB STEEL (BLADE) ×3 IMPLANT
BOOT SUTURE AID YELLOW STND (SUTURE) ×6 IMPLANT
BRUSH SCRUB EZ  4% CHG (MISCELLANEOUS) ×2
BRUSH SCRUB EZ 4% CHG (MISCELLANEOUS) ×1 IMPLANT
CANISTER SUCT 1200ML W/VALVE (MISCELLANEOUS) ×3 IMPLANT
CLIP APPLIE 11 MED OPEN (CLIP) IMPLANT
CLIP APPLIE 9.375 SM OPEN (CLIP) IMPLANT
DERMABOND ADVANCED (GAUZE/BANDAGES/DRESSINGS) ×2
DERMABOND ADVANCED .7 DNX12 (GAUZE/BANDAGES/DRESSINGS) ×1 IMPLANT
DRAPE INCISE IOBAN 66X45 STRL (DRAPES) ×3 IMPLANT
DRAPE LAPAROTOMY 100X77 ABD (DRAPES) ×3 IMPLANT
DRAPE SHEET LG 3/4 BI-LAMINATE (DRAPES) ×3 IMPLANT
DRESSING SURGICEL FIBRLLR 1X2 (HEMOSTASIS) ×1 IMPLANT
DRSG SURGICEL FIBRILLAR 1X2 (HEMOSTASIS) ×3
DURAPREP 26ML APPLICATOR (WOUND CARE) ×3 IMPLANT
ELECT CAUTERY BLADE 6.4 (BLADE) ×3 IMPLANT
ELECT REM PT RETURN 9FT ADLT (ELECTROSURGICAL) ×3
ELECTRODE REM PT RTRN 9FT ADLT (ELECTROSURGICAL) ×1 IMPLANT
GLOVE BIO SURGEON STRL SZ7 (GLOVE) ×12 IMPLANT
GLOVE INDICATOR 7.5 STRL GRN (GLOVE) ×3 IMPLANT
GLOVE SURG SYN 8.0 (GLOVE) ×3 IMPLANT
GOWN STRL REUS W/ TWL LRG LVL3 (GOWN DISPOSABLE) ×2 IMPLANT
GOWN STRL REUS W/ TWL XL LVL3 (GOWN DISPOSABLE) ×1 IMPLANT
GOWN STRL REUS W/TWL LRG LVL3 (GOWN DISPOSABLE) ×4
GOWN STRL REUS W/TWL XL LVL3 (GOWN DISPOSABLE) ×2
HOLDER FOLEY CATH W/STRAP (MISCELLANEOUS) ×3 IMPLANT
IV NS 500ML (IV SOLUTION) ×2
IV NS 500ML BAXH (IV SOLUTION) ×1 IMPLANT
KIT TURNOVER KIT A (KITS) ×3 IMPLANT
LABEL OR SOLS (LABEL) ×3 IMPLANT
LOOP RED MAXI  1X406MM (MISCELLANEOUS) ×4
LOOP VESSEL MAXI 1X406 RED (MISCELLANEOUS) ×2 IMPLANT
LOOP VESSEL MINI 0.8X406 BLUE (MISCELLANEOUS) ×1 IMPLANT
LOOPS BLUE MINI 0.8X406MM (MISCELLANEOUS) ×2
NEEDLE FILTER BLUNT 18X 1/2SAF (NEEDLE) ×2
NEEDLE FILTER BLUNT 18X1 1/2 (NEEDLE) ×1 IMPLANT
NEEDLE HYPO 25X1 1.5 SAFETY (NEEDLE) ×3 IMPLANT
NS IRRIG 500ML POUR BTL (IV SOLUTION) ×3 IMPLANT
PACK BASIN MAJOR ARMC (MISCELLANEOUS) ×3 IMPLANT
PATCH CAROTID ECM VASC 1X10 (Prosthesis & Implant Heart) ×3 IMPLANT
PENCIL ELECTRO HAND CTR (MISCELLANEOUS) IMPLANT
SHUNT CAROTID STR REINF 3.0X4. (MISCELLANEOUS) ×3 IMPLANT
SUT MNCRL+ 5-0 UNDYED PC-3 (SUTURE) ×1 IMPLANT
SUT MONOCRYL 5-0 (SUTURE) ×2
SUT PROLENE 6 0 BV (SUTURE) ×36 IMPLANT
SUT PROLENE 7 0 BV 1 (SUTURE) ×18 IMPLANT
SUT SILK 2 0 (SUTURE) ×2
SUT SILK 2-0 18XBRD TIE 12 (SUTURE) ×1 IMPLANT
SUT SILK 3 0 (SUTURE) ×2
SUT SILK 3-0 18XBRD TIE 12 (SUTURE) ×1 IMPLANT
SUT SILK 4 0 (SUTURE) ×2
SUT SILK 4-0 18XBRD TIE 12 (SUTURE) ×1 IMPLANT
SUT VIC AB 3-0 SH 27 (SUTURE) ×2
SUT VIC AB 3-0 SH 27X BRD (SUTURE) ×1 IMPLANT
SYR 10ML LL (SYRINGE) ×3 IMPLANT
SYR 20CC LL (SYRINGE) ×3 IMPLANT
SYR 3ML LL SCALE MARK (SYRINGE) ×3 IMPLANT
TRAY FOLEY W/METER SILVER 16FR (SET/KITS/TRAYS/PACK) ×3 IMPLANT
TUBING CONNECTING 10 (TUBING) IMPLANT
TUBING CONNECTING 10' (TUBING)

## 2017-08-19 NOTE — Transfer of Care (Signed)
Immediate Anesthesia Transfer of Care Note  Patient: Edward Thompson  Procedure(s) Performed: ENDARTERECTOMY CAROTID (Left )  Patient Location: PACU  Anesthesia Type:General  Level of Consciousness: awake, alert  and responds to stimulation  Airway & Oxygen Therapy: Patient Spontanous Breathing and Patient connected to face mask oxygen  Post-op Assessment: Report given to RN and Post -op Vital signs reviewed and stable  Post vital signs: Reviewed and stable  Last Vitals:  Vitals Value Taken Time  BP 102/49 08/19/2017 10:39 AM  Temp    Pulse 65 08/19/2017 10:39 AM  Resp 11 08/19/2017 10:39 AM  SpO2 98 % 08/19/2017 10:39 AM    Last Pain:  Vitals:   08/19/17 0613  TempSrc: Tympanic  PainSc: 0-No pain         Complications: No apparent anesthesia complications

## 2017-08-19 NOTE — Op Note (Signed)
[facility]   OPERATIVE NOTE  PROCEDURE:   1.  Left carotid endarterectomy with CorMatrix arterial patch reconstruction  PRE-OPERATIVE DIAGNOSIS: 1.  Symptomatic critical carotid stenosis 2.  TIA with syncope  POST-OPERATIVE DIAGNOSIS: same as above   SURGEON: Renford Dills, MD  ASSISTANT(S): Ms. Raul Del  ANESTHESIA: general  ESTIMATED BLOOD LOSS: 75 cc  FINDING(S): 1.  Extensive calcified carotid plaque.  SPECIMEN(S):  Carotid plaque (sent to Pathology)  INDICATIONS:   Edward Thompson is a 67 y.o. y.o. male who presents with left carotid stenosis of 90 %.  The risks, benefits, and alternatives to carotid endarterectomy were discussed with the patient. The differences between carotid stenting and carotid endarterectomy were reviewed.  The patient voiced understanding and appears to be aware that the risks of carotid endarterectomy include but are not limited to: bleeding, infection, stroke, myocardial infarction, death, cranial nerve injuries both temporary and permanent, neck hematoma, possible airway compromise, labile blood pressure post-operatively, cerebral hyperperfusion syndrome, and possible need for additional interventions in the future. The patient is aware of the risks and agrees to proceed forward with the procedure.  DESCRIPTION: After full informed written consent was obtained from the patient, the patient was brought back to the operating room and placed supine upon the operating table.  Prior to induction, the patient received IV antibiotics.  After obtaining adequate anesthesia, the patient was placed a supine position with a shoulder roll in place and the patient's neck slightly hyperextended and rotated away from the surgical site.  The patient was prepped in the standard fashion for a carotid endarterectomy.    The incision was made anterior to the sternocleidomastoid muscle and dissected down through the subcutaneous tissue.  The  platysmas was opened with electrocautery.  The internal jugular vein and facial vein were identified.  The facial vein is ligated and divided between 2-0 silk ties.  The omohyoid was identified in the common carotid artery exposed at this level. The dissection was there in carried out along the carotid artery in a cranial direction.  The dissection was then carried along periadventitial plane along the common carotid artery up to the bifurcation. The external carotid artery was identified. Vessel loops were then placed around the external carotid artery as well as the superior thyroid artery. In the process of this dissection, the hypoglossal nerve was identified and protected from harm.  The internal carotid artery was then dissected circumferentially just beyond an area in the internal carotid artery distal to the plaque.    At this point, we gave the patient 7000 units of intravenous heparin.  After this was allowed to circulate for several minutes, the common carotid followed by the external carotid and then the internal carotid artery were clamped.  Arteriotomy was made in the common carotid artery with a 11 blade, and extended the arteriotomy with a Potts scissor down into the common carotid artery, then the arteriotomy was carried through the bifurcation into the internal carotid artery until I reached an area that was not diseased.  At this point, a Sundt shunt was placed.  The endarterectomy was begun in the common carotid artery with a Runner, broadcasting/film/video and carried this dissection down into the common carotid artery circumferentially.  Then I transected the plaque at a segment where it was adherent and transected the plaque with Potts scissors.  I then carried this dissection up into the external carotid artery.  The plaque was extracted by unclamping the external carotid  artery and performing an eversion endarterectomy.  The dissection was then carried into the internal carotid artery where a   feathered end point was created.  The plaque was passed off the field as a specimen.  The distal endpoint was tacked down with 6 interrupted 7-0 Prolene sutures.  A CorMatrix arterial patch was delivered onto the field and trimmed appropriately for the artery and sewed in place with 6-0 Prolene using a 4 quadrant technique.  The medial suture line was completed and the lateral suture line was run approximately one quarter the length of the arteriotomy.  Prior to completing this patch angioplasty, the shunt was removed, the internal carotid artery was flushed and there was excellent backbleeding.  The carotid artery repair was flushed with heparinized saline and then the patch angioplasty was completed in the usual fashion.  The flow was then reestablished first to the external carotid artery and then the internal carotid artery to prevent distal embolization.   Several minutes of pressure were held and 6-0 Prolene patch sutures were used as need for hemostasis.  At this point, I placed Surgicel and Evicel topical hemostatic agents.  There was no more active bleeding in the surgical site.  The sternocleidomastoid space was closed with three interrupted 3-0 Vicryl sutures. I then reapproximated the platysma muscle with a running stitch of 3-0 Vicryl.  The skin was then closed with a running subcuticular 4-0 Monocryl.  The skin was then cleaned, dried and Dermabond was used to reinforce the skin closure.  The patient awakened and was taken to the recovery room in stable condition, following commands and moving all four extremities without any apparent deficits.    COMPLICATIONS: none  CONDITION: stable  Levora Dredge 08/19/2017<10:28 AM

## 2017-08-19 NOTE — Progress Notes (Signed)
   08/19/17 1405  Clinical Encounter Type  Visited With Patient and family together  Visit Type Initial   Introductory chaplain visit.  Patient and family declined visit.  Chaplain encouraged them to have staff page chaplain if needs change.

## 2017-08-19 NOTE — Anesthesia Post-op Follow-up Note (Signed)
Anesthesia QCDR form completed.        

## 2017-08-19 NOTE — Anesthesia Preprocedure Evaluation (Signed)
Anesthesia Evaluation  Patient identified by MRN, date of birth, ID band Patient awake    Reviewed: Allergy & Precautions, H&P , NPO status , Patient's Chart, lab work & pertinent test results, reviewed documented beta blocker date and time   History of Anesthesia Complications Negative for: history of anesthetic complications  Airway Mallampati: II  TM Distance: >3 FB Neck ROM: full    Dental  (+) Dental Advidsory Given   Pulmonary neg shortness of breath, sleep apnea , neg COPD, neg recent URI,           Cardiovascular Exercise Tolerance: Good hypertension, On Medications (-) angina+ CAD, + Past MI, + Cardiac Stents and + Peripheral Vascular Disease  (-) CABG (-) dysrhythmias (-) Valvular Problems/Murmurs     Neuro/Psych Seizures -,  negative psych ROS   GI/Hepatic negative GI ROS, Neg liver ROS,   Endo/Other  diabetes  Renal/GU negative Renal ROS  negative genitourinary   Musculoskeletal   Abdominal   Peds  Hematology negative hematology ROS (+)   Anesthesia Other Findings Past Medical History: No date: CAD (coronary artery disease) 08/2017: Carotid stenosis, left No date: Diabetes mellitus without complication (HCC) 2008: Fracture of neck (HCC)     Comment:  fell off a roof. required halo x 4 months. also               fractured alot of vertebrae 2015: Heart attack (HCC) No date: Hyperlipidemia No date: Hypertension No date: Myocardial infarction acute (HCC) No date: Seizures (HCC)     Comment:  taking phenobarbitol and dilantin. LAST SEIZURE WAS               2016. well controlled on meds No date: Sleep apnea     Comment:  USES CPAP 2019: Syncope No date: Vertigo No date: Viral meningitis   Reproductive/Obstetrics negative OB ROS                             Anesthesia Physical Anesthesia Plan  ASA: III  Anesthesia Plan: General   Post-op Pain Management:     Induction: Intravenous  PONV Risk Score and Plan: 2 and Ondansetron and Dexamethasone  Airway Management Planned: Oral ETT  Additional Equipment: Arterial line  Intra-op Plan:   Post-operative Plan: Extubation in OR  Informed Consent: I have reviewed the patients History and Physical, chart, labs and discussed the procedure including the risks, benefits and alternatives for the proposed anesthesia with the patient or authorized representative who has indicated his/her understanding and acceptance.   Dental Advisory Given  Plan Discussed with: Anesthesiologist, CRNA and Surgeon  Anesthesia Plan Comments:         Anesthesia Quick Evaluation

## 2017-08-19 NOTE — H&P (Signed)
[facility] Admission History & Physical  MRN : [id number]  Edward Thompson is a 67 y.o. (01-11-1951) male who presents with chief complaint of here for carotid surgery.  History of Present Illness:   The patient is seen for preop evaluation of carotid stenosis. The carotid stenosis was identified after a syncopal event about one month ago.  Previous CT angiogram Finding from CTA neck on 02/06/2016: 1. No acute finding. 2. Atherosclerosis with up to 60% proximal left ICA stenosis. No flow limiting stenosis in the right carotid. 3. Codominant vertebral arteries with moderate left ostial stenosis. Mild-to-moderate bilateral V4 atherosclerotic narrowing.  Follow up CT angiogram done at [facility] now shows >80% LICA  The patient denies amaurosis fugax. There is no recent history of TIA symptoms or focal motor deficits. There is no prior documented CVA.  There is no history of migraine headaches. There is no history of seizures.  The patient is taking enteric-coated aspirin 81 mg daily.  The patient has a history of coronary artery disease, no recent episodes of angina or shortness of breath. The patient denies PAD or claudication symptoms. There is a history of hyperlipidemia which is being treated with a statin.     Current Facility-Administered Medications  Medication Dose Route Frequency Provider Last Rate Last Dose  . 0.9 %  sodium chloride infusion   Intravenous Continuous Naomie Dean, MD 100 mL/hr at 08/19/17 [phone]    . 0.9 %  sodium chloride infusion   Intravenous Continuous Zenola Dezarn, Latina Craver, MD 50 mL/hr at 08/19/17 [phone]    . ceFAZolin (ANCEF) 2-3 GM-%(50ML) IVPB SOLR           . ceFAZolin (ANCEF) IVPB 2g/100 mL premix  2 g Intravenous On Call to OR Stegmayer, Ranae Plumber, PA-C      . Chlorhexidine Gluconate Cloth 2 % PADS 6 each  6 each Topical Once Stegmayer, Kimberly A, PA-C       And  . Chlorhexidine Gluconate Cloth 2 % PADS 6  each  6 each Topical Once Stegmayer, Kimberly A, PA-C      . [COMPLETED] metoprolol succinate (TOPROL-XL) 24 hr tablet 50 mg  50 mg Oral Daily Naomie Dean, MD   50 mg at 08/19/17 7494    Past Medical History:  Diagnosis Date  . CAD (coronary artery disease)   . Carotid stenosis, left 08/2017  . Diabetes mellitus without complication (HCC)   . Fracture of neck (HCC) 2008   fell off a roof. required halo x 4 months. also fractured alot of vertebrae  . Heart attack (HCC) 2015  . Hyperlipidemia   . Hypertension   . Myocardial infarction acute (HCC)   . Seizures (HCC)    taking phenobarbitol and dilantin. LAST SEIZURE WAS 2016. well controlled on meds  . Sleep apnea    USES CPAP  . Syncope 2019  . Vertigo   . Viral meningitis     Past Surgical History:  Procedure Laterality Date  . CARDIAC SURGERY    . CORONARY ANGIOPLASTY     PATIENT UNAWARE OF THIS  . CORONARY ARTERY BYPASS GRAFT  2015  . HERNIA REPAIR Right 1985   inguinal    Social History Social History   Tobacco Use  . Smoking status: Never Smoker  . Smokeless tobacco: Never Used  Substance Use Topics  . Alcohol use: No  . Drug use: No    Family History Family History  Problem Relation Age of Onset  .  Heart disease Mother   . Heart attack Father   No family history of bleeding/clotting disorders, porphyria or autoimmune disease   No Known Allergies   REVIEW OF SYSTEMS (Negative unless checked)  Constitutional: [] Weight loss  [] Fever  [] Chills Cardiac: [] Chest pain   [] Chest pressure   [] Palpitations   [] Shortness of breath when laying flat   [] Shortness of breath at rest   [x] Shortness of breath with exertion. Vascular:  [] Pain in legs with walking   [] Pain in legs at rest   [] Pain in legs when laying flat   [] Claudication   [] Pain in feet when walking  [] Pain in feet at rest  [] Pain in feet when laying flat   [] History of DVT   [] Phlebitis   [x] Swelling in legs   [] Varicose veins   [] Non-healing  ulcers Pulmonary:   [] Uses home oxygen   [] Productive cough   [] Hemoptysis   [] Wheeze  [] COPD   [] Asthma Neurologic:  [x] Dizziness  [] Blackouts   [x] Seizures   [] History of stroke   [] History of TIA  [] Aphasia   [] Temporary blindness   [] Dysphagia   [] Weakness or numbness in arms   [] Weakness or numbness in legs Musculoskeletal:  [] Arthritis   [] Joint swelling   [] Joint pain   [] Low back pain Hematologic:  [] Easy bruising  [] Easy bleeding   [] Hypercoagulable state   [] Anemic  [] Hepatitis Gastrointestinal:  [] Blood in stool   [] Vomiting blood  [] Gastroesophageal reflux/heartburn   [] Difficulty swallowing. Genitourinary:  [] Chronic kidney disease   [] Difficult urination  [] Frequent urination  [] Burning with urination   [] Blood in urine Skin:  [] Rashes   [] Ulcers   [] Wounds Psychological:  [] History of anxiety   []  History of major depression.  Physical Examination  Vitals:   08/19/17 0613  BP: (!) 149/83  Pulse: 70  Resp: 16  Temp: (!) 97.5 F (36.4 C)  TempSrc: Tympanic  SpO2: 96%  Weight: 103 kg (227 lb)  Height: 5\' 4"  (1.626 m)   Body mass index is 38.96 kg/m. Gen: WD/WN, NAD Head: [place]/AT, No temporalis wasting.  Ear/Nose/Throat: Hearing grossly intact, nares w/o erythema or drainage, oropharynx w/o Erythema/Exudate, Eyes: Sclera non-icteric, conjunctiva clear Neck: Supple, no nuchal rigidity.  No JVD.  Pulmonary:  Good air movement, no increased work of respiration or use of accessory muscles  Cardiac: RRR, normal S1, S2, no Murmurs, rubs or gallops. Vascular:  Bilateral carotid bruits Vessel Right Left  Radial Palpable Palpable  Ulnar Palpable Palpable  Brachial Palpable Palpable  Carotid Palpable, without bruit Palpable, without bruit  Gastrointestinal: soft, non-tender/non-distended. No guarding/reflex. No masses, surgical incisions, or scars. Musculoskeletal: M/S 5/5 throughout.  No deformity or atrophy.  2+ edema Neurologic: Sensation grossly intact in extremities.   Symmetrical.  Speech is fluent. Motor exam as listed above. Psychiatric: Judgment intact, Mood & affect appropriate for pt's clinical situation. Dermatologic: No rashes or ulcers noted.  No cellulitis or open wounds. Lymph : No Cervical, Axillary, or Inguinal lymphadenopathy.      CBC Lab Results  Component Value Date   WBC 8.5 08/12/2017   HGB 13.9 08/12/2017   HCT 41.6 08/12/2017   MCV 90.9 08/12/2017   PLT 187 08/12/2017    BMET    Component Value Date/Time   NA 136 08/12/2017 1134   NA 139 09/22/2013 1832   K 4.2 08/12/2017 1134   K 3.8 09/22/2013 1832   CL 103 08/12/2017 1134   CL 106 09/22/2013 1832   CO2 21 (L) 08/12/2017 1134   CO2  25 09/22/2013 1832   GLUCOSE 120 (H) 08/12/2017 1134   GLUCOSE 162 (H) 09/22/2013 1832   BUN 15 08/12/2017 1134   BUN 16 09/22/2013 1832   CREATININE 0.70 08/12/2017 1134   CREATININE 0.91 09/22/2013 1832   CALCIUM 9.1 08/12/2017 1134   CALCIUM 8.5 09/22/2013 1832   GFRNONAA >60 08/12/2017 1134   GFRNONAA >60 09/22/2013 1832   GFRAA >60 08/12/2017 1134   GFRAA >60 09/22/2013 1832   Estimated Creatinine Clearance: 97.2 mL/min (by C-G formula based on SCr of 0.7 mg/dL).  COAG Lab Results  Component Value Date   INR 1.11 08/12/2017   INR 1.08 02/05/2016   INR 1.1 09/22/2013    Radiology No results found.    Assessment/Plan 1. Syncope, unspecified syncope type Recommend:  The patient is symptomatic with respect to the carotid stenosis.  and the patient has now progressed and has a lesion the is >80%.  Patient's CT angiography of the carotid arteries confirms >80% LICA stenosis.  I will request a DVD of the CT angiogram to plan for surgical repair.  The patient does indeed need surgery, therefore, cardiac clearance will be arranged. Once cleared the patient will be scheduled for surgery.  The risks, benefits and alternative therapies were reviewed in detail with the patient.  All questions were answered.  The  patient agrees to proceed with surgery of the left carotid artery.  Continue antiplatelet therapy as prescribed. Continue management of CAD, HTN and Hyperlipidemia. Healthy heart diet, encouraged exercise at least 4 times per week.    2. Stenosis of left carotid artery See #1  3. Mixed hyperlipidemia Continue statin as ordered and reviewed, no changes at this time   4. Essential hypertension Continue antihypertensive medications as already ordered, these medications have been reviewed and there are no changes at this time.       Levora Dredge, MD  08/19/2017 7:28 AM

## 2017-08-19 NOTE — Anesthesia Procedure Notes (Signed)
Arterial Line Insertion Performed by: Lenard Simmer, MD, Casey Burkitt, CRNA, CRNA  Patient location: OR. Preanesthetic checklist: patient identified, IV checked, site marked, risks and benefits discussed, surgical consent, monitors and equipment checked, pre-op evaluation, timeout performed and anesthesia consent Right, radial was placed Catheter size: 20 G Hand hygiene performed  Allen's test indicative of satisfactory collateral circulation Attempts: 3 Procedure performed without using ultrasound guided technique. Following insertion, Biopatch and dressing applied. Patient tolerated the procedure well with no immediate complications.

## 2017-08-19 NOTE — Anesthesia Procedure Notes (Signed)
Procedure Name: Intubation Performed by: Lance Muss, CRNA Pre-anesthesia Checklist: Patient identified, Patient being monitored, Timeout performed, Emergency Drugs available and Suction available Patient Re-evaluated:Patient Re-evaluated prior to induction Oxygen Delivery Method: Circle system utilized Preoxygenation: Pre-oxygenation with 100% oxygen Induction Type: IV induction Ventilation: Mask ventilation without difficulty Laryngoscope Size: Mac and 3 Grade View: Grade II Tube type: Oral Tube size: 7.5 mm Number of attempts: 1 Airway Equipment and Method: Stylet and LTA kit utilized Placement Confirmation: ETT inserted through vocal cords under direct vision,  positive ETCO2 and breath sounds checked- equal and bilateral Secured at: 21 cm Tube secured with: Tape Dental Injury: Teeth and Oropharynx as per pre-operative assessment

## 2017-08-20 ENCOUNTER — Inpatient Hospital Stay: Payer: Medicare Other

## 2017-08-20 LAB — BASIC METABOLIC PANEL
ANION GAP: 4 — AB (ref 5–15)
BUN: 12 mg/dL (ref 6–20)
CHLORIDE: 110 mmol/L (ref 101–111)
CO2: 24 mmol/L (ref 22–32)
Calcium: 8.4 mg/dL — ABNORMAL LOW (ref 8.9–10.3)
Creatinine, Ser: 0.68 mg/dL (ref 0.61–1.24)
GFR calc non Af Amer: >60 mL/min (ref >60)
Glucose, Bld: 158 mg/dL — ABNORMAL HIGH (ref 65–99)
POTASSIUM: 4.4 mmol/L (ref 3.5–5.1)
Sodium: 138 mmol/L (ref 135–145)

## 2017-08-20 LAB — CBC
HEMATOCRIT: 36.9 % — AB (ref 40.0–52.0)
HEMOGLOBIN: 12.8 g/dL — AB (ref 13.0–18.0)
MCH: 31.9 pg (ref 26.0–34.0)
MCHC: 34.6 g/dL (ref 32.0–36.0)
MCV: 92.2 fL (ref 80.0–100.0)
Platelets: 157 10*3/uL (ref 150–440)
RBC: 4 MIL/uL — AB (ref 4.40–5.90)
RDW: 13.8 % (ref 11.5–14.5)
WBC: 11.3 10*3/uL — AB (ref 3.8–10.6)

## 2017-08-20 LAB — URINALYSIS, COMPLETE (UACMP) WITH MICROSCOPIC
BILIRUBIN URINE: NEGATIVE
Bacteria, UA: NONE SEEN
GLUCOSE, UA: NEGATIVE mg/dL
Hgb urine dipstick: NEGATIVE
KETONES UR: NEGATIVE mg/dL
Leukocytes, UA: NEGATIVE
Nitrite: NEGATIVE
PH: 5 (ref 5.0–8.0)
Protein, ur: NEGATIVE mg/dL
SPECIFIC GRAVITY, URINE: 1.015 (ref 1.005–1.030)
Squamous Epithelial / LPF: NONE SEEN

## 2017-08-20 LAB — BLOOD GAS, ARTERIAL
ACID-BASE DEFICIT: 1.7 mmol/L (ref 0.0–2.0)
Bicarbonate: 21.5 mmol/L (ref 20.0–28.0)
FIO2: 0.28
O2 Saturation: 93.4 %
PCO2 ART: 31 mmHg — AB (ref 32.0–48.0)
PH ART: 7.45 (ref 7.350–7.450)
Patient temperature: 37
pO2, Arterial: 65 mmHg — ABNORMAL LOW (ref 83.0–108.0)

## 2017-08-20 LAB — GLUCOSE, CAPILLARY: Glucose-Capillary: 138 mg/dL — ABNORMAL HIGH (ref 65–99)

## 2017-08-20 NOTE — Anesthesia Postprocedure Evaluation (Signed)
Anesthesia Post Note  Patient: Edward Thompson  Procedure(s) Performed: ENDARTERECTOMY CAROTID (Left )  Patient location during evaluation: SICU Anesthesia Type: General Level of consciousness: awake and alert Pain management: pain level controlled Vital Signs Assessment: post-procedure vital signs reviewed and stable Respiratory status: spontaneous breathing and respiratory function stable Cardiovascular status: stable Postop Assessment: no apparent nausea or vomiting Anesthetic complications: no     Last Vitals:  Vitals:   08/20/17 0800 08/20/17 0900  BP: (!) 116/53 (!) 111/58  Pulse: 73 81  Resp: (!) 52 (!) 34  Temp: 37.1 C   SpO2: 91% 93%    Last Pain:  Vitals:   08/20/17 0800  TempSrc: Oral  PainSc: 0-No pain                 Lenard Simmer

## 2017-08-20 NOTE — Progress Notes (Signed)
Pt's family in to visit this evening, stating he is not acting himself. He has been very sleepy all day, but oriented X4. He states that his room is upside down and the people in the room are upside down. Neuro assessment complete with no remarkable findings. Vital signs obtained. Temp 99.0 and O2 sat 93% on 2 L O2. Pt denies SOB. Bilateral lung bases diminished. Pt also has not been able to void since transfer. Paged Dr Myra Gianotti and updated him about the situations. New orders placed. Pt taken down for CT head and CXR. Family at beside updated on the plan of care. Report given to [facility], oncoming nurse for night shift.

## 2017-08-20 NOTE — Progress Notes (Signed)
Pt received from ICU. Telemetry monitor applied to pt and verified. Pt is A&Ox4, but appears to be very sleepy. SpO2 88% on RA. 2 L O2 via [place] applied to pt. Other VSS. Assisted pt to [facility] for small BM, and back to bed. Educated pt on safety measures and how to call for assistance. Bed alarm turned on. Pt denies any needs at this time. Will continue to monitor.

## 2017-08-20 NOTE — Progress Notes (Signed)
Patient bladder scanned per protocol post foley removal.  Scan shows in bladder.  Patient encouraged to void.  64ml voided in urinal.  Dr. Myra Gianotti paged, awaiting callback.

## 2017-08-20 NOTE — Progress Notes (Signed)
S/p Left CEA yesterday.  Transferred out of ICU this am.  Family at bedside stating he is not himself.  He has been lethargic.  He states that things are upside down.  No focal neurologic changes.  O2 sats decreased, now on O2.  Unclear etiology of current symptoms.  Will re-insert foley as he has not been able to void.  WIll check UA. Check CXR and ABG Stat heat CT and carotid duplex  Durene Cal

## 2017-08-20 NOTE — Progress Notes (Signed)
    Subjective  - POD #1, s/p Left CEA  No complaints this am Tolerating PO Nurses state he is 2 person assist to stand up and he ahs fallen a few times in the past 6 months   Physical Exam:  Left CEA incision without hematoma or darainage Neuro intact, tongue midline Slight marginal mandibular neuropraxia       Assessment/Plan:  POD #1  Neuro intact s/p left CEA Will ask PT to eval given history of falls and trouble standing up Transfer to 2A Likely needs 1 more day in hospital  Wells Erma Raiche 08/20/2017 11:05 AM --  Vitals:   08/20/17 0900 08/20/17 1000  BP: (!) 111/58 (!) 117/55  Pulse: 81 78  Resp: (!) 34 (!) 28  Temp:    SpO2: 93% 92%    Intake/Output Summary (Last 24 hours) at 08/20/2017 1105 Last data filed at 08/20/2017 1000 Gross per 24 hour  Intake 1505 ml  Output 900 ml  Net 605 ml     Laboratory CBC    Component Value Date/Time   WBC 11.3 (H) 08/20/2017 0433   HGB 12.8 (L) 08/20/2017 0433   HGB 13.7 09/22/2013 1832   HCT 36.9 (L) 08/20/2017 0433   HCT 41.0 09/22/2013 1832   PLT 157 08/20/2017 0433   PLT 146 (L) 09/22/2013 1832    BMET    Component Value Date/Time   NA 138 08/20/2017 0433   NA 139 09/22/2013 1832   K 4.4 08/20/2017 0433   K 3.8 09/22/2013 1832   CL 110 08/20/2017 0433   CL 106 09/22/2013 1832   CO2 24 08/20/2017 0433   CO2 25 09/22/2013 1832   GLUCOSE 158 (H) 08/20/2017 0433   GLUCOSE 162 (H) 09/22/2013 1832   BUN 12 08/20/2017 0433   BUN 16 09/22/2013 1832   CREATININE 0.68 08/20/2017 0433   CREATININE 0.91 09/22/2013 1832   CALCIUM 8.4 (L) 08/20/2017 0433   CALCIUM 8.5 09/22/2013 1832   GFRNONAA >60 08/20/2017 0433   GFRNONAA >60 09/22/2013 1832   GFRAA >60 08/20/2017 0433   GFRAA >60 09/22/2013 1832    COAG Lab Results  Component Value Date   INR 1.11 08/12/2017   INR 1.08 02/05/2016   INR 1.1 09/22/2013   No results found for: PTT  Antibiotics Anti-infectives (From admission, onward)   Start      Dose/Rate Route Frequency Ordered Stop   08/19/17 1400  ceFAZolin (ANCEF) IVPB 2g/100 mL premix     2 g 200 mL/hr over 30 Minutes Intravenous Every 8 hours 08/19/17 1154 08/20/17 0553   08/19/17 0604  ceFAZolin (ANCEF) 2-3 GM-%(50ML) IVPB SOLR    Note to Pharmacy:  Letta Pate   : cabinet override      08/19/17 0604 08/19/17 1814   08/19/17 0600  ceFAZolin (ANCEF) IVPB 2g/100 mL premix     2 g 200 mL/hr over 30 Minutes Intravenous On call to O.R. 08/18/17 2154 08/19/17 0836       V. Charlena Cross, M.D. [facility] [place] Office: [phone] Pager:  [phone]

## 2017-08-20 NOTE — Progress Notes (Signed)
CT Head negative UA negative CXR : right basilar atelectasis with additional atelectasis versus consolidation left lower lobe ABG:  7.45/31/65  93%  Carotid duplex pending  Suspect a pulmonary process causing his issues.  No obvious infection, likely atelectasis.  Incentive spirometer ordered to bedside.   Durene Cal

## 2017-08-20 NOTE — Progress Notes (Signed)
Per Dr. Myra Gianotti, In&Out cath patient now to empty bladder.  If patient has not voided in 6hrs (2000 tonight); bladder scan again.  If bladder scan shows >474ml, re-insert foley catheter for the night and will re-evaluate tomorrow.

## 2017-08-21 ENCOUNTER — Inpatient Hospital Stay: Payer: Medicare Other

## 2017-08-21 ENCOUNTER — Encounter: Payer: Self-pay | Admitting: Radiology

## 2017-08-21 LAB — BASIC METABOLIC PANEL
ANION GAP: 6 (ref 5–15)
BUN: 16 mg/dL (ref 6–20)
CHLORIDE: 108 mmol/L (ref 101–111)
CO2: 25 mmol/L (ref 22–32)
Calcium: 8.6 mg/dL — ABNORMAL LOW (ref 8.9–10.3)
Creatinine, Ser: 0.74 mg/dL (ref 0.61–1.24)
GFR calc Af Amer: >60 mL/min (ref >60)
GLUCOSE: 187 mg/dL — AB (ref 65–99)
POTASSIUM: 3.8 mmol/L (ref 3.5–5.1)
Sodium: 139 mmol/L (ref 135–145)

## 2017-08-21 LAB — CBC
HEMATOCRIT: 37 % — AB (ref 40.0–52.0)
Hemoglobin: 12.7 g/dL — ABNORMAL LOW (ref 13.0–18.0)
MCH: 31.7 pg (ref 26.0–34.0)
MCHC: 34.5 g/dL (ref 32.0–36.0)
MCV: 92 fL (ref 80.0–100.0)
PLATELETS: 170 10*3/uL (ref 150–440)
RBC: 4.02 MIL/uL — AB (ref 4.40–5.90)
RDW: 13.8 % (ref 11.5–14.5)
WBC: 10.6 10*3/uL (ref 3.8–10.6)

## 2017-08-21 MED ORDER — SODIUM CHLORIDE 0.9% FLUSH
3.0000 mL | Freq: Two times a day (BID) | INTRAVENOUS | Status: DC
  Administered 2017-08-21 – 2017-08-25 (×8): 3 mL via INTRAVENOUS

## 2017-08-21 MED ORDER — PANTOPRAZOLE SODIUM 40 MG PO TBEC
40.0000 mg | DELAYED_RELEASE_TABLET | Freq: Every day | ORAL | Status: DC
  Administered 2017-08-21: 40 mg via ORAL
  Filled 2017-08-21: qty 1

## 2017-08-21 MED ORDER — TAMSULOSIN HCL 0.4 MG PO CAPS
0.4000 mg | ORAL_CAPSULE | Freq: Every day | ORAL | Status: DC
  Administered 2017-08-21 – 2017-08-24 (×4): 0.4 mg via ORAL
  Filled 2017-08-21 (×4): qty 1

## 2017-08-21 MED ORDER — IOHEXOL 350 MG/ML SOLN
75.0000 mL | Freq: Once | INTRAVENOUS | Status: AC | PRN
  Administered 2017-08-21: 75 mL via INTRAVENOUS

## 2017-08-21 NOTE — Progress Notes (Addendum)
    Subjective  - POD #2  Had another episode of vision changes which he describes as the room being upside down.  It is in both eyes, not one   Physical Exam:  No focal deficits Incision soft and dry       Assessment/Plan:  POD #2  Add Flomax and plan to d/c foley in am Check CBC and BMET today Carotid duplex has not been done yet, so I will get a CTA instead. Daughter updated via phone   ADDENDUM:  CTA results: 1. Expected appearance of left carotid endarterectomy. No visible embolic disease. 2. Intracranial atherosclerosis with proximal right PCA occlusion. No evidence of associated infarct. 3. 35% atheromatous narrowing at the right ICA bulb. 4. 50% narrowing of the left vertebral origin. Moderate to advanced bilateral V4 segment stenoses. 5. Atelectasis and trace pleural fluid.  CBC/BMP normal   Wells Shabnam Ladd 08/21/2017 3:31 PM --  Vitals:   08/21/17 0843 08/21/17 1220  BP: 133/67 (!) 119/53  Pulse: 81   Resp: 14   Temp: 98 F (36.7 C)   SpO2: 90% 93%    Intake/Output Summary (Last 24 hours) at 08/21/2017 1531 Last data filed at 08/20/2017 2200 Gross per 24 hour  Intake -  Output 400 ml  Net -400 ml     Laboratory CBC    Component Value Date/Time   WBC 11.3 (H) 08/20/2017 0433   HGB 12.8 (L) 08/20/2017 0433   HGB 13.7 09/22/2013 1832   HCT 36.9 (L) 08/20/2017 0433   HCT 41.0 09/22/2013 1832   PLT 157 08/20/2017 0433   PLT 146 (L) 09/22/2013 1832    BMET    Component Value Date/Time   NA 138 08/20/2017 0433   NA 139 09/22/2013 1832   K 4.4 08/20/2017 0433   K 3.8 09/22/2013 1832   CL 110 08/20/2017 0433   CL 106 09/22/2013 1832   CO2 24 08/20/2017 0433   CO2 25 09/22/2013 1832   GLUCOSE 158 (H) 08/20/2017 0433   GLUCOSE 162 (H) 09/22/2013 1832   BUN 12 08/20/2017 0433   BUN 16 09/22/2013 1832   CREATININE 0.68 08/20/2017 0433   CREATININE 0.91 09/22/2013 1832   CALCIUM 8.4 (L) 08/20/2017 0433   CALCIUM 8.5 09/22/2013 1832     GFRNONAA >60 08/20/2017 0433   GFRNONAA >60 09/22/2013 1832   GFRAA >60 08/20/2017 0433   GFRAA >60 09/22/2013 1832    COAG Lab Results  Component Value Date   INR 1.11 08/12/2017   INR 1.08 02/05/2016   INR 1.1 09/22/2013   No results found for: PTT  Antibiotics Anti-infectives (From admission, onward)   Start     Dose/Rate Route Frequency Ordered Stop   08/19/17 1400  ceFAZolin (ANCEF) IVPB 2g/100 mL premix     2 g 200 mL/hr over 30 Minutes Intravenous Every 8 hours 08/19/17 1154 08/20/17 0553   08/19/17 0604  ceFAZolin (ANCEF) 2-3 GM-%(50ML) IVPB SOLR    Note to Pharmacy:  Letta Pate   : cabinet override      08/19/17 0604 08/19/17 1814   08/19/17 0600  ceFAZolin (ANCEF) IVPB 2g/100 mL premix     2 g 200 mL/hr over 30 Minutes Intravenous On call to O.R. 08/18/17 2154 08/19/17 0836       V. Charlena Cross, M.D. [facility] [place] Office: [phone] Pager:  [phone]

## 2017-08-21 NOTE — Evaluation (Signed)
Physical Therapy Evaluation Patient Details Name: Edward Thompson MRN: [id number] DOB: 02-Nov-1950 Today's Date: 08/21/2017   History of Present Illness  Patient is a pleasant 67 y/o male with history of MI, CAD, vertigo, and seizure disorder. He presents for planned endartectomy for carotid blockage. Post-operatively patient has had some complaints of feeling "upside down", however CT scans have been negative for acute processes.   Clinical Impression  Patient initially presented lethargically in this session, but appeared to become more engaged with increased mobility. He presented for planned surgical intervention of carotid blockages and was initially reporting a feeling of "upside down", imaging was negative for any acute processes. He did not report this today. He was able to ambulate a short distance with RW initially, though does require assistance for transfers and bed mobility. He was however, able to ambulate progressively increased distances after initial bout, up to 125' before a rest break was required. He did demonstrate fatigue, and given that he lives alone he would likely benefit from SNF at discharge to allow him to improve his mobility for return home.     Follow Up Recommendations SNF    Equipment Recommendations  Rolling walker with 5" wheels    Recommendations for Other Services       Precautions / Restrictions Precautions Precautions: Fall Restrictions Weight Bearing Restrictions: No      Mobility  Bed Mobility Overal bed mobility: Needs Assistance Bed Mobility: Supine to Sit     Supine to sit: Min assist     General bed mobility comments: Patient requires assistance to elevate torso off of the bed-surface, but is able to complete transfer of LEs off the edge of the bed.   Transfers Overall transfer level: Needs assistance Equipment used: Rolling walker (2 wheeled) Transfers: Sit to/from Stand Sit to Stand: Min assist         General transfer  comment: Patient requires assistance to complete sit to stand transfer, prolonged time to complete.   Ambulation/Gait Ambulation/Gait assistance: Min guard;Min assist Ambulation Distance (Feet): 30 Feet(See misc exercises notes) Assistive device: Rolling walker (2 wheeled) Gait Pattern/deviations: WFL(Within Functional Limits);Decreased step length - right;Decreased step length - left   Gait velocity interpretation: <1.31 ft/sec, indicative of household ambulator General Gait Details: Patient ambulates with flexed trunk, no loss of balance noted, though decreased stride lengths bilaterally. Required a rest break at this time, no loss of balance observed.   Stairs            Wheelchair Mobility    Modified Rankin (Stroke Patients Only)       Balance Overall balance assessment: Needs assistance Sitting-balance support: Bilateral upper extremity supported Sitting balance-Leahy Scale: Good     Standing balance support: Bilateral upper extremity supported Standing balance-Leahy Scale: Fair                               Pertinent Vitals/Pain Pain Assessment: Faces Faces Pain Scale: Hurts a little bit Pain Location: L side of his neck Pain Descriptors / Indicators: Aching Pain Intervention(s): Limited activity within patient's tolerance    Home Living Family/patient expects to be discharged to:: Private residence Living Arrangements: Alone Available Help at Discharge: Family Type of Home: House Home Access: Stairs to enter Entrance Stairs-Rails: Left Entrance Stairs-Number of Steps: 3 Home Layout: One level Home Equipment: Emergency planning/management officer - 2 wheels;Cane - single point      Prior Function Level of Independence: Independent with  assistive device(s)         Comments: Patient had been ambulating with spc/RW at home, was previously driving.      Hand Dominance   Dominant Hand: Right    Extremity/Trunk Assessment   Upper Extremity  Assessment Upper Extremity Assessment: Overall WFL for tasks assessed    Lower Extremity Assessment Lower Extremity Assessment: Overall WFL for tasks assessed       Communication   Communication: No difficulties  Cognition Arousal/Alertness: Awake/alert Behavior During Therapy: WFL for tasks assessed/performed Overall Cognitive Status: Within Functional Limits for tasks assessed                                 General Comments: Soft spoken, but able to communicate relevant information.       General Comments General comments (skin integrity, edema, etc.): L post-operative incision noted in anterior cervical area.     Exercises General Exercises - Lower Extremity Ankle Circles/Pumps: AROM;10 reps;Both Long Arc Quad: AROM;15 reps;Both Heel Slides: AROM;15 reps;Both Straight Leg Raises: AROM;15 reps;Both Other Exercises Other Exercises: Multiple bouts of ambulation in the hallway after completion of initial gait bout, he was able to ambulate 125' at a time for 2 bouts without significant desaturation observed.    Assessment/Plan    PT Assessment Patient needs continued PT services  PT Problem List Decreased strength;Decreased mobility;Decreased safety awareness;Cardiopulmonary status limiting activity;Decreased knowledge of use of DME;Decreased balance       PT Treatment Interventions DME instruction;Therapeutic activities;Gait training;Therapeutic exercise;Stair training;Balance training;Neuromuscular re-education    PT Goals (Current goals can be found in the Care Plan section)  Acute Rehab PT Goals Patient Stated Goal: To return home safely  PT Goal Formulation: With patient Time For Goal Achievement: 09/04/17 Potential to Achieve Goals: Good    Frequency Min 2X/week   Barriers to discharge Decreased caregiver support Patient lives alone at home, requires assistance for mobility.     Co-evaluation               AM-PAC PT "6 Clicks" Daily  Activity  Outcome Measure Difficulty turning over in bed (including adjusting bedclothes, sheets and blankets)?: A Little Difficulty moving from lying on back to sitting on the side of the bed? : A Little Difficulty sitting down on and standing up from a chair with arms (e.g., wheelchair, bedside commode, etc,.)?: A Little Help needed moving to and from a bed to chair (including a wheelchair)?: A Little Help needed walking in hospital room?: A Little Help needed climbing 3-5 steps with a railing? : A Lot 6 Click Score: 17    End of Session Equipment Utilized During Treatment: Gait belt;Oxygen Activity Tolerance: Patient tolerated treatment well;Patient limited by fatigue Patient left: in chair;with chair alarm set;with call bell/phone within reach Nurse Communication: Mobility status PT Visit Diagnosis: History of falling (Z91.81);Muscle weakness (generalized) (M62.81);Difficulty in walking, not elsewhere classified (R26.2)    Time: 1010-1041 PT Time Calculation (min) (ACUTE ONLY): 31 min   Charges:   PT Evaluation $PT Eval Moderate Complexity: 1 Mod PT Treatments $Therapeutic Exercise: 8-22 mins   PT G Codes:        Alva Garnet PT, DPT, CSCS    08/21/2017, 3:01 PM

## 2017-08-21 NOTE — Progress Notes (Signed)
Patient was initially set up on cpap pressure of 10. Patient has now requested pressure to be decreased to 5. He states 10 is too high. Patient tolerated pressure drop well

## 2017-08-21 NOTE — Progress Notes (Signed)
PHARMACIST - PHYSICIAN COMMUNICATION  CONCERNING: IV to Oral Route Change Policy  RECOMMENDATION: This patient is receiving pantoprazole by the intravenous route.  Based on criteria approved by the Pharmacy and Therapeutics Committee, the intravenous medication(s) is/are being converted to the equivalent oral dose form(s).   DESCRIPTION: These criteria include:  The patient is eating (either orally or via tube) and/or has been taking other orally administered medications for a least 24 hours  The patient has no evidence of active gastrointestinal bleeding or impaired GI absorption (gastrectomy, short bowel, patient on TNA or NPO).  If you have questions about this conversion, please contact the Pharmacy Department  []   [phone] )  [facility] [x]   [phone] )  [facility] []   [phone] )  [facility] []   [phone] )  [facility] []   [phone] )  [facility]   Marlaina Coburn L, [facility] 08/21/2017 9:30 PM

## 2017-08-22 DIAGNOSIS — H539 Unspecified visual disturbance: Secondary | ICD-10-CM

## 2017-08-22 LAB — HEMOGLOBIN A1C
HEMOGLOBIN A1C: 5.5 % (ref 4.8–5.6)
Mean Plasma Glucose: 111.15 mg/dL

## 2017-08-22 LAB — GLUCOSE, CAPILLARY
GLUCOSE-CAPILLARY: 152 mg/dL — AB (ref 65–99)
GLUCOSE-CAPILLARY: 118 mg/dL — AB (ref 65–99)
Glucose-Capillary: 118 mg/dL — ABNORMAL HIGH (ref 65–99)

## 2017-08-22 LAB — PROCALCITONIN

## 2017-08-22 LAB — SURGICAL PATHOLOGY

## 2017-08-22 MED ORDER — INSULIN ASPART 100 UNIT/ML ~~LOC~~ SOLN
0.0000 [IU] | Freq: Three times a day (TID) | SUBCUTANEOUS | Status: DC
  Administered 2017-08-22: 2 [IU] via SUBCUTANEOUS
  Administered 2017-08-23 – 2017-08-24 (×3): 1 [IU] via SUBCUTANEOUS
  Filled 2017-08-22 (×4): qty 1

## 2017-08-22 MED ORDER — ASPIRIN EC 81 MG PO TBEC
81.0000 mg | DELAYED_RELEASE_TABLET | Freq: Every day | ORAL | Status: DC
  Administered 2017-08-23 – 2017-08-25 (×3): 81 mg via ORAL
  Filled 2017-08-22 (×3): qty 1

## 2017-08-22 MED ORDER — METFORMIN HCL 500 MG PO TABS
500.0000 mg | ORAL_TABLET | Freq: Two times a day (BID) | ORAL | Status: DC
Start: 2017-08-22 — End: 2017-08-25
  Administered 2017-08-22 – 2017-08-25 (×6): 500 mg via ORAL
  Filled 2017-08-22 (×6): qty 1

## 2017-08-22 MED ORDER — PIPERACILLIN-TAZOBACTAM 3.375 G IVPB
3.3750 g | Freq: Four times a day (QID) | INTRAVENOUS | Status: DC
  Administered 2017-08-22: 3.375 g via INTRAVENOUS
  Filled 2017-08-22: qty 50

## 2017-08-22 NOTE — Consult Note (Signed)
Reason for Consult:Visual changes Referring Physician: Schnier  CC: Visual changes  HPI: Edward Thompson is an 67 y.o. male admitted for elective left carotid endarterectomy.  Seems to be recovering well from surgery but now complains of visual changes.  Reports that he will have episodes when everything near the ceiling in the room appears to be on the floor and his bed appears to be up against the wall.  If there is a person standing in the room they appear to be at the floor and he appears above them.  Episodes last from a few seconds to a few minutes and occur multiple times a day.  The last episodes were overnight.  There is no complaint of diplopia or blurred vision.  Although patient with a history of seizures this is not a typical aura for him.    Past Medical History:  Diagnosis Date  . CAD (coronary artery disease)   . Carotid stenosis, left 08/2017  . Diabetes mellitus without complication (HCC)   . Fracture of neck (HCC) 2008   fell off a roof. required halo x 4 months. also fractured alot of vertebrae  . Heart attack (HCC) 2015  . Hyperlipidemia   . Hypertension   . Myocardial infarction acute (HCC)   . Seizures (HCC)    taking phenobarbitol and dilantin. LAST SEIZURE WAS 2016. well controlled on meds  . Sleep apnea    USES CPAP  . Syncope 2019  . Vertigo   . Viral meningitis     Past Surgical History:  Procedure Laterality Date  . CARDIAC SURGERY    . CORONARY ANGIOPLASTY     PATIENT UNAWARE OF THIS  . CORONARY ARTERY BYPASS GRAFT  2015  . ENDARTERECTOMY Left 08/19/2017   Procedure: ENDARTERECTOMY CAROTID;  Surgeon: Renford Dills, MD;  Location: [facility] ORS;  Service: Vascular;  Laterality: Left;  . HERNIA REPAIR Right 1985   inguinal    Family History  Problem Relation Age of Onset  . Heart disease Mother   . Heart attack Father     Social History:  reports that he has never smoked. He has never used smokeless tobacco. He reports that he does not drink  alcohol or use drugs.  No Known Allergies  Medications:  I have reviewed the patient's current medications. Prior to Admission:  Medications Prior to Admission  Medication Sig Dispense Refill Last Dose  . aspirin EC 81 MG tablet Take 81 mg by mouth at bedtime.    08/18/2017 at Unknown time  . clopidogrel (PLAVIX) 75 MG tablet Take 75 mg by mouth at bedtime.    08/12/2017  . isosorbide mononitrate (IMDUR) 30 MG 24 hr tablet Take 30 mg by mouth at bedtime.    08/18/2017 at Unknown time  . meclizine (ANTIVERT) 25 MG tablet Take 50 mg by mouth 2 (two) times daily.    08/18/2017 at Unknown time  . metFORMIN (GLUCOPHAGE) 500 MG tablet Take 500 mg by mouth daily.    08/18/2017 at Unknown time  . metoprolol succinate (TOPROL-XL) 50 MG 24 hr tablet Take 50 mg by mouth daily. Take with or immediately following a meal IN THE MORNING   08/18/2017 at 0900  . PHENobarbital (LUMINAL) 64.8 MG tablet Take 129.6 mg by mouth at bedtime.    08/18/2017 at Unknown time  . phenytoin (DILANTIN) 100 MG ER capsule Take 300 mg by mouth at bedtime.    08/18/2017 at Unknown time  . rosuvastatin (CRESTOR) 40 MG tablet Take 40  mg by mouth at bedtime.   08/18/2017 at Unknown time  . nitroGLYCERIN (NITROSTAT) 0.4 MG SL tablet Place 0.4 mg under the tongue every 5 (five) minutes as needed for chest pain.    Not Taking at Unknown time   Scheduled: . [START ON 08/23/2017] aspirin EC  81 mg Oral Daily  . docusate sodium  100 mg Oral Daily  . insulin aspart  0-9 Units Subcutaneous TID WC  . isosorbide mononitrate  30 mg Oral QHS  . meclizine  50 mg Oral BID  . metFORMIN  500 mg Oral BID WC  . metoprolol succinate  50 mg Oral QPC breakfast  . PHENobarbital  129.6 mg Oral QHS  . phenytoin  300 mg Oral QHS  . rosuvastatin  40 mg Oral QHS  . sodium chloride flush  3 mL Intravenous Q12H  . tamsulosin  0.4 mg Oral QPC supper    ROS: History obtained from the patient  General ROS: negative for - chills, fatigue, fever, night sweats,  weight gain or weight loss Psychological ROS: negative for - behavioral disorder, hallucinations, memory difficulties, mood swings or suicidal ideation Ophthalmic ROS: legally blind in right eye ENT ROS: negative for - epistaxis, nasal discharge, oral lesions, sore throat, tinnitus or vertigo Allergy and Immunology ROS: negative for - hives or itchy/watery eyes Hematological and Lymphatic ROS: negative for - bleeding problems, bruising or swollen lymph nodes Endocrine ROS: negative for - galactorrhea, hair pattern changes, polydipsia/polyuria or temperature intolerance Respiratory ROS: negative for - cough, hemoptysis, shortness of breath or wheezing Cardiovascular ROS: leg swelling Gastrointestinal ROS: negative for - abdominal pain, diarrhea, hematemesis, nausea/vomiting or stool incontinence Genito-Urinary ROS: negative for - dysuria, hematuria, incontinence or urinary frequency/urgency Musculoskeletal ROS: negative for - joint swelling or muscular weakness Neurological ROS: as noted in HPI Dermatological ROS: negative for rash and skin lesion changes  Physical Examination: Blood pressure 134/72, pulse 75, temperature 98.3 F (36.8 C), temperature source Oral, resp. rate 18, height 5\' 4"  (1.626 m), weight 103 kg (227 lb), SpO2 95 %.  HEENT-  Normocephalic, no lesions, without obvious abnormality.  Normal external eye and conjunctiva.  Normal TM's bilaterally.  Normal auditory canals and external ears. Normal external nose, mucus membranes and septum.  Normal pharynx. Cardiovascular- S1, S2 normal, pulses palpable throughout   Lungs- chest clear, no wheezing, rales, normal symmetric air entry Abdomen- soft, non-tender; bowel sounds normal; no masses,  no organomegaly Extremities- pitting edema in BLE's Lymph-no adenopathy palpable Musculoskeletal-no joint tenderness, deformity or swelling Skin-warm and dry, no hyperpigmentation, vitiligo, or suspicious lesions  Neurological Examination    Mental Status: Alert, oriented, thought content appropriate.  Speech fluent without evidence of aphasia.  Able to follow 3 step commands without difficulty. Cranial Nerves: II: Discs flat bilaterally; Visual fields grossly normal III,IV, VI: ptosis not present, extra-ocular motions intact bilaterally V,VII: decrease in left NLF, facial light touch sensation normal bilaterally VIII: hearing normal bilaterally IX,X: gag reflex present XI: bilateral shoulder shrug XII: midline tongue extension Motor: Right : Upper extremity   5/5    Left:     Upper extremity   5/5  Lower extremity   5/5     Lower extremity   5/5 Tone and bulk:normal tone throughout; no atrophy noted Sensory: Pinprick and light touch intact throughout, bilaterally Deep Tendon Reflexes: 1+ and symmetric throughout with absent AJ's bilaterally Plantars: Right: mute   Left: mute Cerebellar: Normal finger-to-nose testing bilaterally Gait: not tested due to safety concerns  Laboratory Studies:   Basic Metabolic Panel: Recent Labs  Lab 08/20/17 0433 08/21/17 1542  NA 138 139  K 4.4 3.8  CL 110 108  CO2 24 25  GLUCOSE 158* 187*  BUN 12 16  CREATININE 0.68 0.74  CALCIUM 8.4* 8.6*    Liver Function Tests: No results for input(s): AST, ALT, ALKPHOS, BILITOT, PROT, ALBUMIN in the last 168 hours. No results for input(s): LIPASE, AMYLASE in the last 168 hours. No results for input(s): AMMONIA in the last 168 hours.  CBC: Recent Labs  Lab 08/20/17 0433 08/21/17 1542  WBC 11.3* 10.6  HGB 12.8* 12.7*  HCT 36.9* 37.0*  MCV 92.2 92.0  PLT 157 170    Cardiac Enzymes: No results for input(s): CKTOTAL, CKMB, CKMBINDEX, TROPONINI in the last 168 hours.  BNP: Invalid input(s): POCBNP  CBG: Recent Labs  Lab 08/19/17 1148 08/19/17 1644 08/19/17 2137 08/20/17 0741 08/22/17 1158  GLUCAP 123* 122* 134* 138* 152*    Microbiology: Results for orders placed or performed during the hospital encounter of  08/12/17  Surgical pcr screen     Status: None   Collection Time: 08/12/17 11:34 AM  Result Value Ref Range Status   MRSA, PCR NEGATIVE NEGATIVE Final   Staphylococcus aureus NEGATIVE NEGATIVE Final    Comment: (NOTE) The Xpert SA Assay (FDA approved for NASAL specimens in patients 3 years of age and older), is one component of a comprehensive surveillance program. It is not intended to diagnose infection nor to guide or monitor treatment. Performed at [facility], [address] [place], [place] [address]     Coagulation Studies: No results for input(s): LABPROT, INR in the last 72 hours.  Urinalysis:  Recent Labs  Lab 08/20/17 2011  COLORURINE YELLOW*  LABSPEC 1.015  PHURINE 5.0  GLUCOSEU NEGATIVE  HGBUR NEGATIVE  BILIRUBINUR NEGATIVE  KETONESUR NEGATIVE  PROTEINUR NEGATIVE  NITRITE NEGATIVE  LEUKOCYTESUR NEGATIVE    Lipid Panel:     Component Value Date/Time   CHOL 133 02/06/2016 0419   CHOL 117 09/23/2013 0132   TRIG 138 02/06/2016 0419   TRIG 64 09/23/2013 0132   HDL 38 (L) 02/06/2016 0419   HDL 49 09/23/2013 0132   CHOLHDL 3.5 02/06/2016 0419   VLDL 28 02/06/2016 0419   VLDL 13 09/23/2013 0132   LDLCALC 67 02/06/2016 0419   LDLCALC 55 09/23/2013 0132    HgbA1C:  Lab Results  Component Value Date   HGBA1C 11.7 (H) 02/06/2016    Urine Drug Screen:      Component Value Date/Time   LABOPIA NONE DETECTED 02/05/2016 1450   COCAINSCRNUR NONE DETECTED 02/05/2016 1450   LABBENZ NONE DETECTED 02/05/2016 1450   AMPHETMU NONE DETECTED 02/05/2016 1450   THCU NONE DETECTED 02/05/2016 1450   LABBARB POSITIVE (A) 02/05/2016 1450    Alcohol Level: No results for input(s): ETH in the last 168 hours.  Other results: EKG: normal sinus rhythm at 72 bpm.  Imaging: Ct Angio Head W Or Wo Contrast  Result Date: 08/21/2017 CLINICAL DATA:  Status post carotid endarterectomy. Episode of vision change in both eyes. EXAM: CT ANGIOGRAPHY HEAD AND NECK  TECHNIQUE: Multidetector CT imaging of the head and neck was performed using the standard protocol during bolus administration of intravenous contrast. Multiplanar CT image reconstructions and MIPs were obtained to evaluate the vascular anatomy. Carotid stenosis measurements (when applicable) are obtained utilizing NASCET criteria, using the distal internal carotid diameter as the denominator. CONTRAST:  75mL OMNIPAQUE IOHEXOL 350  MG/ML SOLN COMPARISON:  Head CT from yesterday. Brain MRI 02/06/2016. Neck CTA 02/06/2016 FINDINGS: CT HEAD FINDINGS Brain: No evidence of acute infarction, hemorrhage, hydrocephalus, extra-axial collection or mass lesion/mass effect. Mild patchy low-density in the cerebral white matter is stable and best attributed to chronic small vessel ischemia and dilated perivascular spaces based on 2017 brain MRI. Vascular: See below Skull: Negative. Sinuses: Stable small volume secretions in the sphenoid sinuses. Orbits: Negative Review of the MIP images confirms the above findings CTA NECK FINDINGS Aortic arch: Atherosclerotic calcification. 3 vessel branching pattern. Right carotid system: Atherosclerotic plaque at the bifurcation with up to 35% proximal ICA narrowing. The ICA is tortuous. No beading or dissection is seen. Left carotid system: Fluid and to a lesser extent gas around the carotid sheath extending into the lateral neck, correlating with history of recent carotid endarterectomy. The vessel is smooth and diffusely patent. Vertebral arteries: No proximal subclavian stenosis despite atherosclerosis. Codominant vertebral arteries. There is moderate narrowing of the left vertebral artery at the origin, stenosis measured at 50%. No beading or dissection is seen. Skeleton: Negative. Other neck: Expected postoperative changes in the left neck. 9 mm left thyroid nodule below size threshold for strict sonographic follow-up per consensus guidelines. Upper chest: Atelectasis and trace pleural  fluid. Review of the MIP images confirms the above findings CTA HEAD FINDINGS Anterior circulation: Atherosclerotic plaque on the carotid siphons. The left ICA is smaller than the right in the setting of left A1 hypoplasia. No flow limiting stenosis or branch occlusion is seen. Of the negative for aneurysm. Posterior circulation: Extensive atherosclerotic plaque on the V4 segments with moderate to advanced narrowing bilaterally, worse on the left. These narrowings are over accentuated by small vessel size and calcified plaque blooming. Calcified plaque on the basilar without stenosis. No flow seen beyond the right P1 2 junction. There is some enhancing right V4 branches with nonvisualized collaterals. Venous sinuses: Patent Anatomic variants: As above Delayed phase: No abnormal parenchymal enhancement. These results were called by telephone at the time of interpretation on 08/21/2017 at 5:50 pm to Dr. Coral Else , who verbally acknowledged these results. Review of the MIP images confirms the above findings IMPRESSION: 1. Expected appearance of left carotid endarterectomy. No visible embolic disease. 2. Intracranial atherosclerosis with proximal right PCA occlusion. No evidence of associated infarct. 3. 35% atheromatous narrowing at the right ICA bulb. 4. 50% narrowing of the left vertebral origin. Moderate to advanced bilateral V4 segment stenoses. 5. Atelectasis and trace pleural fluid. Electronically Signed   By: Marnee Spring M.D.   On: 08/21/2017 17:52   Ct Head Wo Contrast  Result Date: 08/20/2017 CLINICAL DATA:  Visual disturbance and lethargy since carotid endarterectomy yesterday. EXAM: CT HEAD WITHOUT CONTRAST TECHNIQUE: Contiguous axial images were obtained from the base of the skull through the vertex without intravenous contrast. COMPARISON:  02/06/2016 brain MRI FINDINGS: Brain: No evidence of acute infarction, hemorrhage, hydrocephalus, extra-axial collection or mass lesion/mass effect. Mild  patchy low-density in the cerebral white matter attributed to chronic small vessel ischemia. Vascular: Extensive atherosclerotic calcification. No hyperdense vessel. Skull: No evidence of acute or aggressive process. Sinuses/Orbits: Mild generalized mucosal thickening with small sphenoid fluid levels. IMPRESSION: 1. No acute finding.  No visible infarct. 2. Mild chronic small vessel ischemia in the cerebral white matter. Electronically Signed   By: Marnee Spring M.D.   On: 08/20/2017 19:20   Ct Angio Neck W Or Wo Contrast  Result Date: 08/21/2017 CLINICAL DATA:  Status post  carotid endarterectomy. Episode of vision change in both eyes. EXAM: CT ANGIOGRAPHY HEAD AND NECK TECHNIQUE: Multidetector CT imaging of the head and neck was performed using the standard protocol during bolus administration of intravenous contrast. Multiplanar CT image reconstructions and MIPs were obtained to evaluate the vascular anatomy. Carotid stenosis measurements (when applicable) are obtained utilizing NASCET criteria, using the distal internal carotid diameter as the denominator. CONTRAST:  75mL OMNIPAQUE IOHEXOL 350 MG/ML SOLN COMPARISON:  Head CT from yesterday. Brain MRI 02/06/2016. Neck CTA 02/06/2016 FINDINGS: CT HEAD FINDINGS Brain: No evidence of acute infarction, hemorrhage, hydrocephalus, extra-axial collection or mass lesion/mass effect. Mild patchy low-density in the cerebral white matter is stable and best attributed to chronic small vessel ischemia and dilated perivascular spaces based on 2017 brain MRI. Vascular: See below Skull: Negative. Sinuses: Stable small volume secretions in the sphenoid sinuses. Orbits: Negative Review of the MIP images confirms the above findings CTA NECK FINDINGS Aortic arch: Atherosclerotic calcification. 3 vessel branching pattern. Right carotid system: Atherosclerotic plaque at the bifurcation with up to 35% proximal ICA narrowing. The ICA is tortuous. No beading or dissection is seen.  Left carotid system: Fluid and to a lesser extent gas around the carotid sheath extending into the lateral neck, correlating with history of recent carotid endarterectomy. The vessel is smooth and diffusely patent. Vertebral arteries: No proximal subclavian stenosis despite atherosclerosis. Codominant vertebral arteries. There is moderate narrowing of the left vertebral artery at the origin, stenosis measured at 50%. No beading or dissection is seen. Skeleton: Negative. Other neck: Expected postoperative changes in the left neck. 9 mm left thyroid nodule below size threshold for strict sonographic follow-up per consensus guidelines. Upper chest: Atelectasis and trace pleural fluid. Review of the MIP images confirms the above findings CTA HEAD FINDINGS Anterior circulation: Atherosclerotic plaque on the carotid siphons. The left ICA is smaller than the right in the setting of left A1 hypoplasia. No flow limiting stenosis or branch occlusion is seen. Of the negative for aneurysm. Posterior circulation: Extensive atherosclerotic plaque on the V4 segments with moderate to advanced narrowing bilaterally, worse on the left. These narrowings are over accentuated by small vessel size and calcified plaque blooming. Calcified plaque on the basilar without stenosis. No flow seen beyond the right P1 2 junction. There is some enhancing right V4 branches with nonvisualized collaterals. Venous sinuses: Patent Anatomic variants: As above Delayed phase: No abnormal parenchymal enhancement. These results were called by telephone at the time of interpretation on 08/21/2017 at 5:50 pm to Dr. Coral Else , who verbally acknowledged these results. Review of the MIP images confirms the above findings IMPRESSION: 1. Expected appearance of left carotid endarterectomy. No visible embolic disease. 2. Intracranial atherosclerosis with proximal right PCA occlusion. No evidence of associated infarct. 3. 35% atheromatous narrowing at the right  ICA bulb. 4. 50% narrowing of the left vertebral origin. Moderate to advanced bilateral V4 segment stenoses. 5. Atelectasis and trace pleural fluid. Electronically Signed   By: Marnee Spring M.D.   On: 08/21/2017 17:52   Dg Chest Port 1 View  Result Date: 08/20/2017 CLINICAL DATA:  Altered mental status, history hypertension, diabetes mellitus, coronary artery disease post CABG and MI EXAM: PORTABLE CHEST 1 VIEW COMPARISON:  Portable exam 1845 hours compared to 02/05/2016 FINDINGS: Enlargement of cardiac silhouette. Rotated to the RIGHT. Mediastinal contours normal. LEFT lower lobe atelectasis versus consolidation. Subsegmental atelectasis RIGHT base. Upper lungs clear. Atherosclerotic calcification aorta. No pleural effusion or pneumothorax. IMPRESSION: Mild enlargement of cardiac silhouette.  RIGHT basilar atelectasis with additional atelectasis versus consolidation/pneumonia in LEFT lower lobe. Electronically Signed   By: Ulyses Southward M.D.   On: 08/20/2017 19:03     Assessment/Plan: 67 year old male complaining of visual changes since left CEA.  Patient's description is quite unusual and does not fit with an ischemic/mass lesion.  Head CT performed and reviewed.  It shows no acute changes.  Patient is on Dilantin and Phb and these levels have not been checked.  Unclear if there may be some contribution from these drugs.  If levels unremarkable would have patient see ophthalmology as well if symptoms continue.    Recommendations: 1.  Dilantin and Phb levels.    Thana Farr, MD Neurology [phone] 08/22/2017, 1:46 PM

## 2017-08-22 NOTE — Consult Note (Signed)
[facility] - [place] [facility]   PATIENT NAME: Edward Thompson    MR#:  [id number]  DATE OF BIRTH:  10-Aug-1950  DATE OF ADMISSION:  08/19/2017  PRIMARY CARE PHYSICIAN: Sherron Monday, MD   CONSULT REQUESTING/REFERRING PHYSICIAN: Dr. Gilda Crease  REASON FOR CONSULT: Cough, abnormal chest x-ray, diabetes mellitus  CHIEF COMPLAINT:  No chief complaint on file.   HISTORY OF PRESENT ILLNESS:  Edward Thompson  is a 67 y.o. male with a known history of hypertension, diabetes, carotid artery stenosis was admitted to the hospital after left carotid endarterectomy.  Patient was found to have dry cough and chest x-ray showing atelectasis versus infiltrates in the bibasilar areas.  Till yesterday he had some vision changes which she described as seeing everything upside down.  This has resolved at this time.  CT head was done and showed nothing acute. Patient is afebrile.  Normal WBC.  Not on oxygen.  No shortness of breath.   PAST MEDICAL HISTORY:   Past Medical History:  Diagnosis Date  . CAD (coronary artery disease)   . Carotid stenosis, left 08/2017  . Diabetes mellitus without complication (HCC)   . Fracture of neck (HCC) 2008   fell off a roof. required halo x 4 months. also fractured alot of vertebrae  . Heart attack (HCC) 2015  . Hyperlipidemia   . Hypertension   . Myocardial infarction acute (HCC)   . Seizures (HCC)    taking phenobarbitol and dilantin. LAST SEIZURE WAS 2016. well controlled on meds  . Sleep apnea    USES CPAP  . Syncope 2019  . Vertigo   . Viral meningitis     PAST SURGICAL HISTOIRY:   Past Surgical History:  Procedure Laterality Date  . CARDIAC SURGERY    . CORONARY ANGIOPLASTY     PATIENT UNAWARE OF THIS  . CORONARY ARTERY BYPASS GRAFT  2015  . ENDARTERECTOMY Left 08/19/2017   Procedure: ENDARTERECTOMY CAROTID;  Surgeon: Renford Dills, MD;  Location: [facility] ORS;  Service: Vascular;  Laterality: Left;  . HERNIA REPAIR Right  1985   inguinal    SOCIAL HISTORY:   Social History   Tobacco Use  . Smoking status: Never Smoker  . Smokeless tobacco: Never Used  Substance Use Topics  . Alcohol use: No    FAMILY HISTORY:   Family History  Problem Relation Age of Onset  . Heart disease Mother   . Heart attack Father     DRUG ALLERGIES:  No Known Allergies  REVIEW OF SYSTEMS:   ROS  CONSTITUTIONAL: No fever.  Positive fatigue EYES: No blurred or double vision.  EARS, NOSE, AND THROAT: No tinnitus or ear pain.  RESPIRATORY: No  shortness of breath, wheezing or hemoptysis.  Dry cough CARDIOVASCULAR: No chest pain, orthopnea, edema.  GASTROINTESTINAL: No nausea, vomiting, diarrhea or abdominal pain.  GENITOURINARY: No dysuria, hematuria.  ENDOCRINE: No polyuria, nocturia,  HEMATOLOGY: No anemia, easy bruising or bleeding SKIN: No rash or lesion. MUSCULOSKELETAL: No joint pain or arthritis.   NEUROLOGIC: No tingling, numbness, weakness.  PSYCHIATRY: No anxiety or depression.   MEDICATIONS AT HOME:   Prior to Admission medications   Medication Sig Start Date End Date Taking? Authorizing Provider  aspirin EC 81 MG tablet Take 81 mg by mouth at bedtime.    Yes [provider]  clopidogrel (PLAVIX) 75 MG tablet Take 75 mg by mouth at bedtime.  01/28/15  Yes [provider]  isosorbide mononitrate (IMDUR) 30 MG  24 hr tablet Take 30 mg by mouth at bedtime.  04/29/17  Yes [provider]  meclizine (ANTIVERT) 25 MG tablet Take 50 mg by mouth 2 (two) times daily.  05/27/14  Yes [provider]  metFORMIN (GLUCOPHAGE) 500 MG tablet Take 500 mg by mouth daily.  04/28/17  Yes [provider]  metoprolol succinate (TOPROL-XL) 50 MG 24 hr tablet Take 50 mg by mouth daily. Take with or immediately following a meal IN THE MORNING   Yes [provider]  PHENobarbital (LUMINAL) 64.8 MG tablet Take 129.6 mg by mouth at bedtime.  05/15/14  Yes [provider]   phenytoin (DILANTIN) 100 MG ER capsule Take 300 mg by mouth at bedtime.    Yes [provider]  rosuvastatin (CRESTOR) 40 MG tablet Take 40 mg by mouth at bedtime.   Yes [provider]  nitroGLYCERIN (NITROSTAT) 0.4 MG SL tablet Place 0.4 mg under the tongue every 5 (five) minutes as needed for chest pain.  08/22/13   [provider]      VITAL SIGNS:  Blood pressure 134/72, pulse 75, temperature 98.3 F (36.8 C), temperature source Oral, resp. rate 18, height 5\' 4"  (1.626 m), weight 103 kg (227 lb), SpO2 95 %.  PHYSICAL EXAMINATION:  GENERAL:  66 y.o.-year-old patient lying in the bed with no acute distress.  EYES: Pupils equal, round, reactive to light and accommodation. No scleral icterus. Extraocular muscles intact.  HEENT: Head atraumatic, normocephalic. Oropharynx and nasopharynx clear.  NECK:  Supple, no jugular venous distention. No thyroid enlargement, no tenderness.  LUNGS: Normal breath sounds bilaterally, no wheezing, rales,rhonchi or crepitation. No use of accessory muscles of respiration.  Decreased air entry at the left base CARDIOVASCULAR: S1, S2 normal. No murmurs, rubs, or gallops.  ABDOMEN: Soft, nontender, nondistended. Bowel sounds present. No organomegaly or mass.  EXTREMITIES: No pedal edema, cyanosis, or clubbing.  NEUROLOGIC: Cranial nerves II through XII are intact. Muscle strength 5/5 in all extremities. Sensation intact. Gait not checked.  PSYCHIATRIC: The patient is alert and oriented x 3.  SKIN: No obvious rash, lesion, or ulcer.   LABORATORY PANEL:   CBC Recent Labs  Lab 08/21/17 1542  WBC 10.6  HGB 12.7*  HCT 37.0*  PLT 170   ------------------------------------------------------------------------------------------------------------------  Chemistries  Recent Labs  Lab 08/21/17 1542  NA 139  K 3.8  CL 108  CO2 25  GLUCOSE 187*  BUN 16  CREATININE 0.74  CALCIUM 8.6*    ------------------------------------------------------------------------------------------------------------------  Cardiac Enzymes No results for input(s): TROPONINI in the last 168 hours. ------------------------------------------------------------------------------------------------------------------  RADIOLOGY:  Ct Angio Head W Or Wo Contrast  Result Date: 08/21/2017 CLINICAL DATA:  Status post carotid endarterectomy. Episode of vision change in both eyes. EXAM: CT ANGIOGRAPHY HEAD AND NECK TECHNIQUE: Multidetector CT imaging of the head and neck was performed using the standard protocol during bolus administration of intravenous contrast. Multiplanar CT image reconstructions and MIPs were obtained to evaluate the vascular anatomy. Carotid stenosis measurements (when applicable) are obtained utilizing NASCET criteria, using the distal internal carotid diameter as the denominator. CONTRAST:  75mL OMNIPAQUE IOHEXOL 350 MG/ML SOLN COMPARISON:  Head CT from yesterday. Brain MRI 02/06/2016. Neck CTA 02/06/2016 FINDINGS: CT HEAD FINDINGS Brain: No evidence of acute infarction, hemorrhage, hydrocephalus, extra-axial collection or mass lesion/mass effect. Mild patchy low-density in the cerebral white matter is stable and best attributed to chronic small vessel ischemia and dilated perivascular spaces based on 2017 brain MRI. Vascular: See below Skull: Negative.  Sinuses: Stable small volume secretions in the sphenoid sinuses. Orbits: Negative Review of the MIP images confirms the above findings CTA NECK FINDINGS Aortic arch: Atherosclerotic calcification. 3 vessel branching pattern. Right carotid system: Atherosclerotic plaque at the bifurcation with up to 35% proximal ICA narrowing. The ICA is tortuous. No beading or dissection is seen. Left carotid system: Fluid and to a lesser extent gas around the carotid sheath extending into the lateral neck, correlating with history of recent carotid endarterectomy.  The vessel is smooth and diffusely patent. Vertebral arteries: No proximal subclavian stenosis despite atherosclerosis. Codominant vertebral arteries. There is moderate narrowing of the left vertebral artery at the origin, stenosis measured at 50%. No beading or dissection is seen. Skeleton: Negative. Other neck: Expected postoperative changes in the left neck. 9 mm left thyroid nodule below size threshold for strict sonographic follow-up per consensus guidelines. Upper chest: Atelectasis and trace pleural fluid. Review of the MIP images confirms the above findings CTA HEAD FINDINGS Anterior circulation: Atherosclerotic plaque on the carotid siphons. The left ICA is smaller than the right in the setting of left A1 hypoplasia. No flow limiting stenosis or branch occlusion is seen. Of the negative for aneurysm. Posterior circulation: Extensive atherosclerotic plaque on the V4 segments with moderate to advanced narrowing bilaterally, worse on the left. These narrowings are over accentuated by small vessel size and calcified plaque blooming. Calcified plaque on the basilar without stenosis. No flow seen beyond the right P1 2 junction. There is some enhancing right V4 branches with nonvisualized collaterals. Venous sinuses: Patent Anatomic variants: As above Delayed phase: No abnormal parenchymal enhancement. These results were called by telephone at the time of interpretation on 08/21/2017 at 5:50 pm to Dr. Coral Else , who verbally acknowledged these results. Review of the MIP images confirms the above findings IMPRESSION: 1. Expected appearance of left carotid endarterectomy. No visible embolic disease. 2. Intracranial atherosclerosis with proximal right PCA occlusion. No evidence of associated infarct. 3. 35% atheromatous narrowing at the right ICA bulb. 4. 50% narrowing of the left vertebral origin. Moderate to advanced bilateral V4 segment stenoses. 5. Atelectasis and trace pleural fluid. Electronically Signed    By: Marnee Spring M.D.   On: 08/21/2017 17:52   Ct Head Wo Contrast  Result Date: 08/20/2017 CLINICAL DATA:  Visual disturbance and lethargy since carotid endarterectomy yesterday. EXAM: CT HEAD WITHOUT CONTRAST TECHNIQUE: Contiguous axial images were obtained from the base of the skull through the vertex without intravenous contrast. COMPARISON:  02/06/2016 brain MRI FINDINGS: Brain: No evidence of acute infarction, hemorrhage, hydrocephalus, extra-axial collection or mass lesion/mass effect. Mild patchy low-density in the cerebral white matter attributed to chronic small vessel ischemia. Vascular: Extensive atherosclerotic calcification. No hyperdense vessel. Skull: No evidence of acute or aggressive process. Sinuses/Orbits: Mild generalized mucosal thickening with small sphenoid fluid levels. IMPRESSION: 1. No acute finding.  No visible infarct. 2. Mild chronic small vessel ischemia in the cerebral white matter. Electronically Signed   By: Marnee Spring M.D.   On: 08/20/2017 19:20   Ct Angio Neck W Or Wo Contrast  Result Date: 08/21/2017 CLINICAL DATA:  Status post carotid endarterectomy. Episode of vision change in both eyes. EXAM: CT ANGIOGRAPHY HEAD AND NECK TECHNIQUE: Multidetector CT imaging of the head and neck was performed using the standard protocol during bolus administration of intravenous contrast. Multiplanar CT image reconstructions and MIPs were obtained to evaluate the vascular anatomy. Carotid stenosis measurements (when applicable) are obtained utilizing NASCET criteria, using the distal internal carotid  diameter as the denominator. CONTRAST:  75mL OMNIPAQUE IOHEXOL 350 MG/ML SOLN COMPARISON:  Head CT from yesterday. Brain MRI 02/06/2016. Neck CTA 02/06/2016 FINDINGS: CT HEAD FINDINGS Brain: No evidence of acute infarction, hemorrhage, hydrocephalus, extra-axial collection or mass lesion/mass effect. Mild patchy low-density in the cerebral white matter is stable and best attributed  to chronic small vessel ischemia and dilated perivascular spaces based on 2017 brain MRI. Vascular: See below Skull: Negative. Sinuses: Stable small volume secretions in the sphenoid sinuses. Orbits: Negative Review of the MIP images confirms the above findings CTA NECK FINDINGS Aortic arch: Atherosclerotic calcification. 3 vessel branching pattern. Right carotid system: Atherosclerotic plaque at the bifurcation with up to 35% proximal ICA narrowing. The ICA is tortuous. No beading or dissection is seen. Left carotid system: Fluid and to a lesser extent gas around the carotid sheath extending into the lateral neck, correlating with history of recent carotid endarterectomy. The vessel is smooth and diffusely patent. Vertebral arteries: No proximal subclavian stenosis despite atherosclerosis. Codominant vertebral arteries. There is moderate narrowing of the left vertebral artery at the origin, stenosis measured at 50%. No beading or dissection is seen. Skeleton: Negative. Other neck: Expected postoperative changes in the left neck. 9 mm left thyroid nodule below size threshold for strict sonographic follow-up per consensus guidelines. Upper chest: Atelectasis and trace pleural fluid. Review of the MIP images confirms the above findings CTA HEAD FINDINGS Anterior circulation: Atherosclerotic plaque on the carotid siphons. The left ICA is smaller than the right in the setting of left A1 hypoplasia. No flow limiting stenosis or branch occlusion is seen. Of the negative for aneurysm. Posterior circulation: Extensive atherosclerotic plaque on the V4 segments with moderate to advanced narrowing bilaterally, worse on the left. These narrowings are over accentuated by small vessel size and calcified plaque blooming. Calcified plaque on the basilar without stenosis. No flow seen beyond the right P1 2 junction. There is some enhancing right V4 branches with nonvisualized collaterals. Venous sinuses: Patent Anatomic variants: As  above Delayed phase: No abnormal parenchymal enhancement. These results were called by telephone at the time of interpretation on 08/21/2017 at 5:50 pm to Dr. Coral Else , who verbally acknowledged these results. Review of the MIP images confirms the above findings IMPRESSION: 1. Expected appearance of left carotid endarterectomy. No visible embolic disease. 2. Intracranial atherosclerosis with proximal right PCA occlusion. No evidence of associated infarct. 3. 35% atheromatous narrowing at the right ICA bulb. 4. 50% narrowing of the left vertebral origin. Moderate to advanced bilateral V4 segment stenoses. 5. Atelectasis and trace pleural fluid. Electronically Signed   By: Marnee Spring M.D.   On: 08/21/2017 17:52   Dg Chest Port 1 View  Result Date: 08/20/2017 CLINICAL DATA:  Altered mental status, history hypertension, diabetes mellitus, coronary artery disease post CABG and MI EXAM: PORTABLE CHEST 1 VIEW COMPARISON:  Portable exam 1845 hours compared to 02/05/2016 FINDINGS: Enlargement of cardiac silhouette. Rotated to the RIGHT. Mediastinal contours normal. LEFT lower lobe atelectasis versus consolidation. Subsegmental atelectasis RIGHT base. Upper lungs clear. Atherosclerotic calcification aorta. No pleural effusion or pneumothorax. IMPRESSION: Mild enlargement of cardiac silhouette. RIGHT basilar atelectasis with additional atelectasis versus consolidation/pneumonia in LEFT lower lobe. Electronically Signed   By: Ulyses Southward M.D.   On: 08/20/2017 19:03    EKG:   Orders placed or performed during the hospital encounter of 08/12/17  . EKG test  . EKG test    IMPRESSION AND PLAN:   *Bilateral atelectasis.  Afebrile.  Normal  WBC. Unclear if this is pneumonia.  No antibiotics.  Instructed patient on how to use incentive spirometer. Not on oxygen.  Will check a pro-calcitonin level.  If this is elevated will treat with Levaquin for 5 days.  *Diabetes mellitus.  On medications from home.   Will add sliding scale insulin.  Diabetic diet.  *Status post left carotid endarterectomy.  Seems to be doing well.  *DVT prophylaxis.  SCDs.  Lovenox can be started if seemed appropriate by vascular surgery.  All the records are reviewed and case discussed with Consulting provider. Management plans discussed with the patient, family and they are in agreement.  CODE STATUS: FULL CODE  TOTAL TIME TAKING CARE OF THIS PATIENT: 40 minutes.    Orie Fisherman M.D on 08/22/2017 at 10:51 AM  Between 7am to 6pm - Pager - [phone]  After 6pm go to [email or web address] - password [email or web address] [facility]  [facility] [place] [facility]  Office  [phone]  CC: Primary care Physician: Sherron Monday, MD  Note: This dictation was prepared with Dragon dictation along with smaller phrase technology. Any transcriptional errors that result from this process are unintentional.

## 2017-08-22 NOTE — Progress Notes (Signed)
Pt. Had urinary catheter removed this morning, he had been experiencing urinary retention, pt. Has not urinated in urinal, bedding wet d/t possible incontinent episode, bladder scan performed showing 321 ml., will continue to monitor at this time.

## 2017-08-22 NOTE — Care Management Important Message (Signed)
Signed copy of IM left in patient's room.  

## 2017-08-23 LAB — PHENOBARBITAL LEVEL: Phenobarbital: 24.1 ug/mL (ref 15.0–30.0)

## 2017-08-23 LAB — GLUCOSE, CAPILLARY
GLUCOSE-CAPILLARY: 124 mg/dL — AB (ref 65–99)
GLUCOSE-CAPILLARY: 114 mg/dL — AB (ref 65–99)
Glucose-Capillary: 125 mg/dL — ABNORMAL HIGH (ref 65–99)
Glucose-Capillary: 118 mg/dL — ABNORMAL HIGH (ref 65–99)

## 2017-08-23 LAB — PHENYTOIN LEVEL, TOTAL: PHENYTOIN LVL: 14.8 ug/mL (ref 10.0–20.0)

## 2017-08-23 NOTE — NC FL2 (Signed)
[place] MEDICAID FL2 LEVEL OF CARE SCREENING TOOL     IDENTIFICATION  Patient Name: Edward Thompson Birthdate: Sep 24, 1950 Sex: male Admission Date (Current Location): 08/19/2017  [place] and [place] Number:  Chiropodist and Address:  [facility], [address] [place], [place] [address]      Provider Number: [id number]  Attending Physician Name and Address:  Renford Dills, MD  Relative Name and Phone Number:  Sandi Mealy Daughter [phone] or Cornelio, [place]   [phone]     Current Level of Care: Hospital Recommended Level of Care: Skilled Nursing Facility Prior Approval Number:    Date Approved/Denied:   PASRR Number: [id number]  Discharge Plan: SNF    Current Diagnoses: Patient Active Problem List   Diagnosis Date Noted  . Symptomatic carotid artery stenosis 08/19/2017  . Syncope 07/18/2017  . Carotid stenosis 07/18/2017  . Hyperlipidemia 07/18/2017  . Essential hypertension 07/18/2017  . Dizziness 02/05/2016    Orientation RESPIRATION BLADDER Height & Weight     Self, Time, Situation, Place  Normal Continent Weight: 227 lb (103 kg) Height:  5\' 4"  (162.6 cm)  BEHAVIORAL SYMPTOMS/MOOD NEUROLOGICAL BOWEL NUTRITION STATUS      Continent Diet  AMBULATORY STATUS COMMUNICATION OF NEEDS Skin   Limited Assist Verbally Surgical wounds                       Personal Care Assistance Level of Assistance  Bathing, Feeding, Dressing Bathing Assistance: Limited assistance Feeding assistance: Independent Dressing Assistance: Limited assistance     Functional Limitations Info  Sight, Hearing, Speech Sight Info: Adequate Hearing Info: Adequate Speech Info: Adequate    SPECIAL CARE FACTORS FREQUENCY  PT (By licensed PT)     PT Frequency: 5x a week              Contractures Contractures Info: Not present    Additional Factors Info  Code Status, Allergies Code Status Info: Full Code Allergies  Info: NKA           Current Medications (08/23/2017):  This is the current hospital active medication list Current Facility-Administered Medications  Medication Dose Route Frequency Provider Last Rate Last Dose  . acetaminophen (TYLENOL) tablet 325-650 mg  325-650 mg Oral Q4H PRN Lewie Loron, NP       Or  . acetaminophen (TYLENOL) suppository 325-650 mg  325-650 mg Rectal Q4H PRN Lewie Loron, NP      . alum & mag hydroxide-simeth (MAALOX/MYLANTA) 200-200-20 MG/5ML suspension 15-30 mL  15-30 mL Oral Q2H PRN Marylou Flesher S, NP      . aspirin EC tablet 81 mg  81 mg Oral Daily Milagros Loll, MD   81 mg at 08/23/17 0837  . docusate sodium (COLACE) capsule 100 mg  100 mg Oral Daily Marylou Flesher S, NP   100 mg at 08/23/17 0838  . guaiFENesin-dextromethorphan (ROBITUSSIN DM) 100-10 MG/5ML syrup 15 mL  15 mL Oral Q4H PRN Marylou Flesher S, NP   15 mL at 08/22/17 0930  . hydrALAZINE (APRESOLINE) injection 5 mg  5 mg Intravenous Q20 Min PRN Tukov, Magadalene S, NP      . insulin aspart (novoLOG) injection 0-9 Units  0-9 Units Subcutaneous TID WC Milagros Loll, MD   1 Units at 08/23/17 1249  . isosorbide mononitrate (IMDUR) 24 hr tablet 30 mg  30 mg Oral QHS Tukov, Magadalene S, NP   30 mg at 08/22/17 2343  . labetalol (NORMODYNE,TRANDATE)  injection 10 mg  10 mg Intravenous Q10 min PRN Marylou Flesher S, NP      . meclizine (ANTIVERT) tablet 50 mg  50 mg Oral BID Marylou Flesher S, NP   50 mg at 08/23/17 0840  . metFORMIN (GLUCOPHAGE) tablet 500 mg  500 mg Oral BID WC Milagros Loll, MD   500 mg at 08/23/17 0839  . metoprolol succinate (TOPROL-XL) 24 hr tablet 50 mg  50 mg Oral QPC breakfast Marylou Flesher S, NP   50 mg at 08/23/17 0837  . nitroGLYCERIN (NITROSTAT) SL tablet 0.4 mg  0.4 mg Sublingual Q5 min PRN Tukov, Magadalene S, NP      . ondansetron (ZOFRAN) injection 4 mg  4 mg Intravenous Q6H PRN Tukov, Magadalene S, NP      . oxyCODONE-acetaminophen  (PERCOCET/ROXICET) 5-325 MG per tablet 1-2 tablet  1-2 tablet Oral Q4H PRN Lewie Loron, NP   2 tablet at 08/23/17 0836  . PHENobarbital (LUMINAL) tablet 129.6 mg  129.6 mg Oral QHS Tukov, Magadalene S, NP   129.6 mg at 08/22/17 2344  . phenol (CHLORASEPTIC) mouth spray 1 spray  1 spray Mouth/Throat PRN Lewie Loron, NP   1 spray at 08/22/17 2342  . phenytoin (DILANTIN) ER capsule 300 mg  300 mg Oral QHS Tukov, Magadalene S, NP   300 mg at 08/22/17 2348  . rosuvastatin (CRESTOR) tablet 40 mg  40 mg Oral QHS Tukov, Magadalene S, NP   40 mg at 08/22/17 2349  . sodium chloride flush (NS) 0.9 % injection 3 mL  3 mL Intravenous Q12H Schnier, Latina Craver, MD   3 mL at 08/23/17 7510  . tamsulosin (FLOMAX) capsule 0.4 mg  0.4 mg Oral QPC supper Nada Libman, MD   0.4 mg at 08/22/17 1819     Discharge Medications: Please see discharge summary for a list of discharge medications.  Relevant Imaging Results:  Relevant Lab Results:   Additional Information SSN [id number]  Darleene Cleaver, [place]

## 2017-08-23 NOTE — Progress Notes (Signed)
[facility] - [place] at [facility]   PATIENT NAME: Edward Thompson    MR#:  [id number]  DATE OF BIRTH:  [birth year]  SUBJECTIVE:   Patient doing well denies any respiratory distress. Has some cough. No fever. REVIEW OF SYSTEMS:   Review of Systems  Constitutional: Negative for chills, fever and weight loss.  HENT: Negative for ear discharge, ear pain and nosebleeds.   Eyes: Negative for blurred vision, pain and discharge.  Respiratory: Positive for cough. Negative for sputum production, shortness of breath, wheezing and stridor.   Cardiovascular: Negative for chest pain, palpitations, orthopnea and PND.  Gastrointestinal: Negative for abdominal pain, diarrhea, nausea and vomiting.  Genitourinary: Negative for frequency and urgency.  Musculoskeletal: Negative for back pain and joint pain.  Neurological: Negative for sensory change, speech change, focal weakness and weakness.  Psychiatric/Behavioral: Negative for depression and hallucinations. The patient is not nervous/anxious.    Tolerating Diet:yes Tolerating PT: yes  DRUG ALLERGIES:  No Known Allergies  VITALS:  Blood pressure 138/73, pulse 70, temperature 98.6 F (37 C), temperature source Oral, resp. rate 14, height 5\' 4"  (1.626 m), weight 103 kg (227 lb), SpO2 91 %.  PHYSICAL EXAMINATION:   Physical Exam  GENERAL:  67 y.o.-year-old patient lying in the bed with no acute distress.  EYES: Pupils equal, round, reactive to light and accommodation. No scleral icterus. Extraocular muscles intact.  HEENT: Head atraumatic, normocephalic. Oropharynx and nasopharynx clear.  NECK:  Supple, no jugular venous distention. No thyroid enlargement, no tenderness.  LUNGS: Normal breath sounds bilaterally, no wheezing, rales, rhonchi. No use of accessory muscles of respiration.  CARDIOVASCULAR: S1, S2 normal. No murmurs, rubs, or gallops.  ABDOMEN: Soft, nontender, nondistended. Bowel sounds present. No  organomegaly or mass.  EXTREMITIES: No cyanosis, clubbing or edema b/l.    NEUROLOGIC: Cranial nerves II through XII are intact. No focal Motor or sensory deficits b/l.   PSYCHIATRIC:  patient is alert and oriented x 3.  SKIN: No obvious rash, lesion, or ulcer.   LABORATORY PANEL:  CBC Recent Labs  Lab 08/21/17 1542  WBC 10.6  HGB 12.7*  HCT 37.0*  PLT 170    Chemistries  Recent Labs  Lab 08/21/17 1542  NA 139  K 3.8  CL 108  CO2 25  GLUCOSE 187*  BUN 16  CREATININE 0.74  CALCIUM 8.6*   Cardiac Enzymes No results for input(s): TROPONINI in the last 168 hours. RADIOLOGY:  Ct Angio Head W Or Wo Contrast  Result Date: 08/21/2017 CLINICAL DATA:  Status post carotid endarterectomy. Episode of vision change in both eyes. EXAM: CT ANGIOGRAPHY HEAD AND NECK TECHNIQUE: Multidetector CT imaging of the head and neck was performed using the standard protocol during bolus administration of intravenous contrast. Multiplanar CT image reconstructions and MIPs were obtained to evaluate the vascular anatomy. Carotid stenosis measurements (when applicable) are obtained utilizing NASCET criteria, using the distal internal carotid diameter as the denominator. CONTRAST:  75mL OMNIPAQUE IOHEXOL 350 MG/ML SOLN COMPARISON:  Head CT from yesterday. Brain MRI 02/06/2016. Neck CTA 02/06/2016 FINDINGS: CT HEAD FINDINGS Brain: No evidence of acute infarction, hemorrhage, hydrocephalus, extra-axial collection or mass lesion/mass effect. Mild patchy low-density in the cerebral white matter is stable and best attributed to chronic small vessel ischemia and dilated perivascular spaces based on 2017 brain MRI. Vascular: See below Skull: Negative. Sinuses: Stable small volume secretions in the sphenoid sinuses. Orbits: Negative Review of the MIP images confirms the above findings CTA NECK FINDINGS Aortic  arch: Atherosclerotic calcification. 3 vessel branching pattern. Right carotid system: Atherosclerotic plaque at  the bifurcation with up to 35% proximal ICA narrowing. The ICA is tortuous. No beading or dissection is seen. Left carotid system: Fluid and to a lesser extent gas around the carotid sheath extending into the lateral neck, correlating with history of recent carotid endarterectomy. The vessel is smooth and diffusely patent. Vertebral arteries: No proximal subclavian stenosis despite atherosclerosis. Codominant vertebral arteries. There is moderate narrowing of the left vertebral artery at the origin, stenosis measured at 50%. No beading or dissection is seen. Skeleton: Negative. Other neck: Expected postoperative changes in the left neck. 9 mm left thyroid nodule below size threshold for strict sonographic follow-up per consensus guidelines. Upper chest: Atelectasis and trace pleural fluid. Review of the MIP images confirms the above findings CTA HEAD FINDINGS Anterior circulation: Atherosclerotic plaque on the carotid siphons. The left ICA is smaller than the right in the setting of left A1 hypoplasia. No flow limiting stenosis or branch occlusion is seen. Of the negative for aneurysm. Posterior circulation: Extensive atherosclerotic plaque on the V4 segments with moderate to advanced narrowing bilaterally, worse on the left. These narrowings are over accentuated by small vessel size and calcified plaque blooming. Calcified plaque on the basilar without stenosis. No flow seen beyond the right P1 2 junction. There is some enhancing right V4 branches with nonvisualized collaterals. Venous sinuses: Patent Anatomic variants: As above Delayed phase: No abnormal parenchymal enhancement. These results were called by telephone at the time of interpretation on 08/21/2017 at 5:50 pm to Dr. Coral Else , who verbally acknowledged these results. Review of the MIP images confirms the above findings IMPRESSION: 1. Expected appearance of left carotid endarterectomy. No visible embolic disease. 2. Intracranial atherosclerosis with  proximal right PCA occlusion. No evidence of associated infarct. 3. 35% atheromatous narrowing at the right ICA bulb. 4. 50% narrowing of the left vertebral origin. Moderate to advanced bilateral V4 segment stenoses. 5. Atelectasis and trace pleural fluid. Electronically Signed   By: Marnee Spring M.D.   On: 08/21/2017 17:52   Ct Angio Neck W Or Wo Contrast  Result Date: 08/21/2017 CLINICAL DATA:  Status post carotid endarterectomy. Episode of vision change in both eyes. EXAM: CT ANGIOGRAPHY HEAD AND NECK TECHNIQUE: Multidetector CT imaging of the head and neck was performed using the standard protocol during bolus administration of intravenous contrast. Multiplanar CT image reconstructions and MIPs were obtained to evaluate the vascular anatomy. Carotid stenosis measurements (when applicable) are obtained utilizing NASCET criteria, using the distal internal carotid diameter as the denominator. CONTRAST:  37mL OMNIPAQUE IOHEXOL 350 MG/ML SOLN COMPARISON:  Head CT from yesterday. Brain MRI 02/06/2016. Neck CTA 02/06/2016 FINDINGS: CT HEAD FINDINGS Brain: No evidence of acute infarction, hemorrhage, hydrocephalus, extra-axial collection or mass lesion/mass effect. Mild patchy low-density in the cerebral white matter is stable and best attributed to chronic small vessel ischemia and dilated perivascular spaces based on 2017 brain MRI. Vascular: See below Skull: Negative. Sinuses: Stable small volume secretions in the sphenoid sinuses. Orbits: Negative Review of the MIP images confirms the above findings CTA NECK FINDINGS Aortic arch: Atherosclerotic calcification. 3 vessel branching pattern. Right carotid system: Atherosclerotic plaque at the bifurcation with up to 35% proximal ICA narrowing. The ICA is tortuous. No beading or dissection is seen. Left carotid system: Fluid and to a lesser extent gas around the carotid sheath extending into the lateral neck, correlating with history of recent carotid  endarterectomy. The vessel is smooth  and diffusely patent. Vertebral arteries: No proximal subclavian stenosis despite atherosclerosis. Codominant vertebral arteries. There is moderate narrowing of the left vertebral artery at the origin, stenosis measured at 50%. No beading or dissection is seen. Skeleton: Negative. Other neck: Expected postoperative changes in the left neck. 9 mm left thyroid nodule below size threshold for strict sonographic follow-up per consensus guidelines. Upper chest: Atelectasis and trace pleural fluid. Review of the MIP images confirms the above findings CTA HEAD FINDINGS Anterior circulation: Atherosclerotic plaque on the carotid siphons. The left ICA is smaller than the right in the setting of left A1 hypoplasia. No flow limiting stenosis or branch occlusion is seen. Of the negative for aneurysm. Posterior circulation: Extensive atherosclerotic plaque on the V4 segments with moderate to advanced narrowing bilaterally, worse on the left. These narrowings are over accentuated by small vessel size and calcified plaque blooming. Calcified plaque on the basilar without stenosis. No flow seen beyond the right P1 2 junction. There is some enhancing right V4 branches with nonvisualized collaterals. Venous sinuses: Patent Anatomic variants: As above Delayed phase: No abnormal parenchymal enhancement. These results were called by telephone at the time of interpretation on 08/21/2017 at 5:50 pm to Dr. Coral Else , who verbally acknowledged these results. Review of the MIP images confirms the above findings IMPRESSION: 1. Expected appearance of left carotid endarterectomy. No visible embolic disease. 2. Intracranial atherosclerosis with proximal right PCA occlusion. No evidence of associated infarct. 3. 35% atheromatous narrowing at the right ICA bulb. 4. 50% narrowing of the left vertebral origin. Moderate to advanced bilateral V4 segment stenoses. 5. Atelectasis and trace pleural fluid.  Electronically Signed   By: Marnee Spring M.D.   On: 08/21/2017 17:52   ASSESSMENT AND PLAN:  *Bilateral atelectasis.  Afebrile.  Normal WBC. Unclear if this is pneumonia.  No antibiotics.  Instructed patient on how to use incentive spirometer. Not on oxygen.   -pro-calcitonin level negative.  -Patient is symptomatic. No indications for antibiotic.  *Diabetes mellitus.  On medications from home.  Will add sliding scale insulin.  Diabetic diet.  *Status post left carotid endarterectomy.  Seems to be doing well.  *DVT prophylaxis.  SCDs.  Lovenox can be started if seemed appropriate by vascular surgery  Overall seems to doing well. He is waiting for discharge disposition. Internal medicine will sign off. Call if needed. Thank you for the consult.  Case discussed with Care Management/Social Worker. Management plans discussed with the patient, family and they are in agreement.  CODE STATUS: full  TOTAL TIME TAKING CARE OF THIS PATIENT: 30 minutes.  >50% time spent on counselling and coordination of care    Note: This dictation was prepared with Dragon dictation along with smaller phrase technology. Any transcriptional errors that result from this process are unintentional.  Enedina Finner M.D on 08/23/2017 at 2:55 PM  Between 7am to 6pm - Pager - [phone]  After 6pm go to [email or web address] - password [email or web address]  [facility]  Office  [phone]  CC: Primary care physician; Sherron Monday, MDPatient ID: Edward Thompson, male   DOB: 1950-05-23, 67 y.o.   MRN: [id number]

## 2017-08-23 NOTE — Progress Notes (Signed)
Bladder scan showed 386 mL urine in bladder after 250 mL output in urinal. Will discuss with oncoming RN about urinary retention plan of care.

## 2017-08-23 NOTE — Progress Notes (Signed)
[facility]  Daily Progress Note   Subjective  - 4 Days Post-Op  The patient denies urinary symptoms.  There are no new arm or leg symptoms.  He does attest to an episode of the room turning 90 degrees with visual changes.  And he states that he has actually been having visual disturbances every day.  They seem to be getting better they are no longer 180 degrees but now only 90 degrees.  They remain intermittent.  They are not associated when any room spinning nausea or other vertigo-like symptoms.  Objective Vitals:   08/23/17 0446 08/23/17 0801 08/23/17 1654 08/23/17 1916  BP: (!) 144/74 138/73 136/77 (!) 157/78  Pulse: 70 70 71 79  Resp:  14 14 18   Temp: 98.2 F (36.8 C) 98.6 F (37 C) 98.4 F (36.9 C) 99.1 F (37.3 C)  TempSrc: Oral Oral Oral Oral  SpO2: 96% 91% 93% 95%  Weight:      Height:        Intake/Output Summary (Last 24 hours) at 08/23/2017 2129 Last data filed at 08/23/2017 1916 Gross per 24 hour  Intake 486 ml  Output 625 ml  Net -139 ml    PULM  Normal effort , no use of accessory muscles CV  No JVD, RRR Abd      No distended, nontender VASC  Left CEA incision CD&I  Laboratory CBC    Component Value Date/Time   WBC 10.6 08/21/2017 1542   HGB 12.7 (L) 08/21/2017 1542   HGB 13.7 09/22/2013 1832   HCT 37.0 (L) 08/21/2017 1542   HCT 41.0 09/22/2013 1832   PLT 170 08/21/2017 1542   PLT 146 (L) 09/22/2013 1832    BMET    Component Value Date/Time   NA 139 08/21/2017 1542   NA 139 09/22/2013 1832   K 3.8 08/21/2017 1542   K 3.8 09/22/2013 1832   CL 108 08/21/2017 1542   CL 106 09/22/2013 1832   CO2 25 08/21/2017 1542   CO2 25 09/22/2013 1832   GLUCOSE 187 (H) 08/21/2017 1542   GLUCOSE 162 (H) 09/22/2013 1832   BUN 16 08/21/2017 1542   BUN 16 09/22/2013 1832   CREATININE 0.74 08/21/2017 1542   CREATININE 0.91 09/22/2013 1832   CALCIUM 8.6 (L) 08/21/2017 1542   CALCIUM 8.5 09/22/2013 1832   GFRNONAA >60 08/21/2017 1542    GFRNONAA >60 09/22/2013 1832   GFRAA >60 08/21/2017 1542   GFRAA >60 09/22/2013 1832    Assessment/Planning: POD #4 s/p left CEA   He does have a bed available at [facility] for rehab.  I will contact Dr. Thad Ranger tomorrow regarding his persistent visual changes to make sure she has no other recommendations.  I have talked with Dr. Lonna Cobb regarding the 250 cc of retention and he is asked to follow-up with the patient in the outpatient clinic and continue his Flomax as ordered.    Levora Dredge  08/23/2017, 9:29 PM

## 2017-08-23 NOTE — Progress Notes (Signed)
Subjective: Patient reports no further visual complaints.    Objective: Current vital signs: BP 138/73 (BP Location: Left Arm)   Pulse 70   Temp 98.6 F (37 C) (Oral)   Resp 14   Ht 5\' 4"  (1.626 m)   Wt 103 kg (227 lb)   SpO2 91%   BMI 38.96 kg/m  Vital signs in last 24 hours: Temp:  [98.2 F (36.8 C)-99.1 F (37.3 C)] 98.6 F (37 C) (04/16 0801) Pulse Rate:  [70-79] 70 (04/16 0801) Resp:  [14-16] 14 (04/16 0801) BP: (135-161)/(70-74) 138/73 (04/16 0801) SpO2:  [91 %-97 %] 91 % (04/16 0801)  Intake/Output from previous day: 04/15 0701 - 04/16 0700 In: 388 [P.O.:360; I.V.:3; IV Piggyback:25] Out: 350 [Urine:350] Intake/Output this shift: Total I/O In: 243 [P.O.:240; I.V.:3] Out: -  Nutritional status: Diet Carb Modified Fluid consistency: Thin; Room service appropriate? Yes  Neurologic Exam: Mental Status: Alert, oriented, thought content appropriate.  Speech fluent without evidence of aphasia.  Able to follow 3 step commands without difficulty. Cranial Nerves: II: Discs flat bilaterally; Visual fields grossly normal III,IV, VI: ptosis not present, extra-ocular motions intact bilaterally V,VII: decrease in left NLF, facial light touch sensation normal bilaterally VIII: hearing normal bilaterally IX,X: gag reflex present XI: bilateral shoulder shrug XII: midline tongue extension Motor: 5/5 throughout  Lab Results: Basic Metabolic Panel: Recent Labs  Lab 08/20/17 0433 08/21/17 1542  NA 138 139  K 4.4 3.8  CL 110 108  CO2 24 25  GLUCOSE 158* 187*  BUN 12 16  CREATININE 0.68 0.74  CALCIUM 8.4* 8.6*    Liver Function Tests: No results for input(s): AST, ALT, ALKPHOS, BILITOT, PROT, ALBUMIN in the last 168 hours. No results for input(s): LIPASE, AMYLASE in the last 168 hours. No results for input(s): AMMONIA in the last 168 hours.  CBC: Recent Labs  Lab 08/20/17 0433 08/21/17 1542  WBC 11.3* 10.6  HGB 12.8* 12.7*  HCT 36.9* 37.0*  MCV 92.2 92.0   PLT 157 170    Cardiac Enzymes: No results for input(s): CKTOTAL, CKMB, CKMBINDEX, TROPONINI in the last 168 hours.  Lipid Panel: No results for input(s): CHOL, TRIG, HDL, CHOLHDL, VLDL, LDLCALC in the last 168 hours.  CBG: Recent Labs  Lab 08/22/17 1158 08/22/17 1809 08/22/17 2147 08/23/17 0800 08/23/17 1206  GLUCAP 152* 118* 118* 125* 124*    Microbiology: Results for orders placed or performed during the hospital encounter of 08/12/17  Surgical pcr screen     Status: None   Collection Time: 08/12/17 11:34 AM  Result Value Ref Range Status   MRSA, PCR NEGATIVE NEGATIVE Final   Staphylococcus aureus NEGATIVE NEGATIVE Final    Comment: (NOTE) The Xpert SA Assay (FDA approved for NASAL specimens in patients 2 years of age and older), is one component of a comprehensive surveillance program. It is not intended to diagnose infection nor to guide or monitor treatment. Performed at [facility], [address] [place], [place] [address]     Coagulation Studies: No results for input(s): LABPROT, INR in the last 72 hours.  Imaging: Ct Angio Head W Or Wo Contrast  Result Date: 08/21/2017 CLINICAL DATA:  Status post carotid endarterectomy. Episode of vision change in both eyes. EXAM: CT ANGIOGRAPHY HEAD AND NECK TECHNIQUE: Multidetector CT imaging of the head and neck was performed using the standard protocol during bolus administration of intravenous contrast. Multiplanar CT image reconstructions and MIPs were obtained to evaluate the vascular anatomy. Carotid stenosis measurements (when  applicable) are obtained utilizing NASCET criteria, using the distal internal carotid diameter as the denominator. CONTRAST:  75mL OMNIPAQUE IOHEXOL 350 MG/ML SOLN COMPARISON:  Head CT from yesterday. Brain MRI 02/06/2016. Neck CTA 02/06/2016 FINDINGS: CT HEAD FINDINGS Brain: No evidence of acute infarction, hemorrhage, hydrocephalus, extra-axial collection or mass lesion/mass effect.  Mild patchy low-density in the cerebral white matter is stable and best attributed to chronic small vessel ischemia and dilated perivascular spaces based on 2017 brain MRI. Vascular: See below Skull: Negative. Sinuses: Stable small volume secretions in the sphenoid sinuses. Orbits: Negative Review of the MIP images confirms the above findings CTA NECK FINDINGS Aortic arch: Atherosclerotic calcification. 3 vessel branching pattern. Right carotid system: Atherosclerotic plaque at the bifurcation with up to 35% proximal ICA narrowing. The ICA is tortuous. No beading or dissection is seen. Left carotid system: Fluid and to a lesser extent gas around the carotid sheath extending into the lateral neck, correlating with history of recent carotid endarterectomy. The vessel is smooth and diffusely patent. Vertebral arteries: No proximal subclavian stenosis despite atherosclerosis. Codominant vertebral arteries. There is moderate narrowing of the left vertebral artery at the origin, stenosis measured at 50%. No beading or dissection is seen. Skeleton: Negative. Other neck: Expected postoperative changes in the left neck. 9 mm left thyroid nodule below size threshold for strict sonographic follow-up per consensus guidelines. Upper chest: Atelectasis and trace pleural fluid. Review of the MIP images confirms the above findings CTA HEAD FINDINGS Anterior circulation: Atherosclerotic plaque on the carotid siphons. The left ICA is smaller than the right in the setting of left A1 hypoplasia. No flow limiting stenosis or branch occlusion is seen. Of the negative for aneurysm. Posterior circulation: Extensive atherosclerotic plaque on the V4 segments with moderate to advanced narrowing bilaterally, worse on the left. These narrowings are over accentuated by small vessel size and calcified plaque blooming. Calcified plaque on the basilar without stenosis. No flow seen beyond the right P1 2 junction. There is some enhancing right V4  branches with nonvisualized collaterals. Venous sinuses: Patent Anatomic variants: As above Delayed phase: No abnormal parenchymal enhancement. These results were called by telephone at the time of interpretation on 08/21/2017 at 5:50 pm to Dr. Coral Else , who verbally acknowledged these results. Review of the MIP images confirms the above findings IMPRESSION: 1. Expected appearance of left carotid endarterectomy. No visible embolic disease. 2. Intracranial atherosclerosis with proximal right PCA occlusion. No evidence of associated infarct. 3. 35% atheromatous narrowing at the right ICA bulb. 4. 50% narrowing of the left vertebral origin. Moderate to advanced bilateral V4 segment stenoses. 5. Atelectasis and trace pleural fluid. Electronically Signed   By: Marnee Spring M.D.   On: 08/21/2017 17:52   Ct Angio Neck W Or Wo Contrast  Result Date: 08/21/2017 CLINICAL DATA:  Status post carotid endarterectomy. Episode of vision change in both eyes. EXAM: CT ANGIOGRAPHY HEAD AND NECK TECHNIQUE: Multidetector CT imaging of the head and neck was performed using the standard protocol during bolus administration of intravenous contrast. Multiplanar CT image reconstructions and MIPs were obtained to evaluate the vascular anatomy. Carotid stenosis measurements (when applicable) are obtained utilizing NASCET criteria, using the distal internal carotid diameter as the denominator. CONTRAST:  75mL OMNIPAQUE IOHEXOL 350 MG/ML SOLN COMPARISON:  Head CT from yesterday. Brain MRI 02/06/2016. Neck CTA 02/06/2016 FINDINGS: CT HEAD FINDINGS Brain: No evidence of acute infarction, hemorrhage, hydrocephalus, extra-axial collection or mass lesion/mass effect. Mild patchy low-density in the cerebral white matter is stable  and best attributed to chronic small vessel ischemia and dilated perivascular spaces based on 2017 brain MRI. Vascular: See below Skull: Negative. Sinuses: Stable small volume secretions in the sphenoid sinuses.  Orbits: Negative Review of the MIP images confirms the above findings CTA NECK FINDINGS Aortic arch: Atherosclerotic calcification. 3 vessel branching pattern. Right carotid system: Atherosclerotic plaque at the bifurcation with up to 35% proximal ICA narrowing. The ICA is tortuous. No beading or dissection is seen. Left carotid system: Fluid and to a lesser extent gas around the carotid sheath extending into the lateral neck, correlating with history of recent carotid endarterectomy. The vessel is smooth and diffusely patent. Vertebral arteries: No proximal subclavian stenosis despite atherosclerosis. Codominant vertebral arteries. There is moderate narrowing of the left vertebral artery at the origin, stenosis measured at 50%. No beading or dissection is seen. Skeleton: Negative. Other neck: Expected postoperative changes in the left neck. 9 mm left thyroid nodule below size threshold for strict sonographic follow-up per consensus guidelines. Upper chest: Atelectasis and trace pleural fluid. Review of the MIP images confirms the above findings CTA HEAD FINDINGS Anterior circulation: Atherosclerotic plaque on the carotid siphons. The left ICA is smaller than the right in the setting of left A1 hypoplasia. No flow limiting stenosis or branch occlusion is seen. Of the negative for aneurysm. Posterior circulation: Extensive atherosclerotic plaque on the V4 segments with moderate to advanced narrowing bilaterally, worse on the left. These narrowings are over accentuated by small vessel size and calcified plaque blooming. Calcified plaque on the basilar without stenosis. No flow seen beyond the right P1 2 junction. There is some enhancing right V4 branches with nonvisualized collaterals. Venous sinuses: Patent Anatomic variants: As above Delayed phase: No abnormal parenchymal enhancement. These results were called by telephone at the time of interpretation on 08/21/2017 at 5:50 pm to Dr. Coral Else , who verbally  acknowledged these results. Review of the MIP images confirms the above findings IMPRESSION: 1. Expected appearance of left carotid endarterectomy. No visible embolic disease. 2. Intracranial atherosclerosis with proximal right PCA occlusion. No evidence of associated infarct. 3. 35% atheromatous narrowing at the right ICA bulb. 4. 50% narrowing of the left vertebral origin. Moderate to advanced bilateral V4 segment stenoses. 5. Atelectasis and trace pleural fluid. Electronically Signed   By: Marnee Spring M.D.   On: 08/21/2017 17:52    Medications:  I have reviewed the patient's current medications. Scheduled: . aspirin EC  81 mg Oral Daily  . docusate sodium  100 mg Oral Daily  . insulin aspart  0-9 Units Subcutaneous TID WC  . isosorbide mononitrate  30 mg Oral QHS  . meclizine  50 mg Oral BID  . metFORMIN  500 mg Oral BID WC  . metoprolol succinate  50 mg Oral QPC breakfast  . PHENobarbital  129.6 mg Oral QHS  . phenytoin  300 mg Oral QHS  . rosuvastatin  40 mg Oral QHS  . sodium chloride flush  3 mL Intravenous Q12H  . tamsulosin  0.4 mg Oral QPC supper    Assessment/Plan: No further visual complaints since the night of 4/14.  Dilantin level 14.8.  Phenobarbital level 24.1.  Patient currently asymptomatic.  If symptoms recurrent would have ophthalmology evaluate.    No further neurologic intervention is recommended at this time.  If further questions arise, please call or page at that time.  Thank you for allowing neurology to participate in the care of this patient.    LOS: 4 days  Thana Farr, MD Neurology [phone] 08/23/2017  12:08 PM

## 2017-08-23 NOTE — Clinical Social Work Note (Signed)
CSW presented bed offers to patient and he chose [facility].  CSW contacted [facility]	[place] who can accept patient once they have received insurance authorization and patient is medically ready for discharge and orders have been received.  Ervin Knack. Loana Salvaggio, MSW, Theresia Majors [phone]  08/23/2017 4:45 PM

## 2017-08-23 NOTE — Clinical Social Work Note (Signed)
Clinical Social Work Assessment  Patient Details  Name: Edward Thompson MRN: [id number] Date of Birth: [birth year]  Date of referral:  08/23/17               Reason for consult:  Facility Placement                Permission sought to share information with:  Facility Medical sales representative Permission granted to share information::  Yes, Verbal Permission Granted  Name::     Navarrete,McKinley Daughter [phone]  or Austinn, Vanwieren   [phone]   Agency::  SNF admissions  Relationship::     Contact Information:     Housing/Transportation Living arrangements for the past 2 months:  Single Family Home Source of Information:  Patient Patient Interpreter Needed:  None Criminal Activity/Legal Involvement Pertinent to Current Situation/Hospitalization:    Significant Relationships:  Adult Children Lives with:  Self Do you feel safe going back to the place where you live?  No Need for family participation in patient care:  No (Coment)  Care giving concerns:  Patient feels he needs some short term rehab before he is able to return back home.   Social Worker assessment / plan:  Patient is a 67 year old male who is alert and oriented x4.  Patient states he has not been to rehab before, CSW explained to patient what to expect and what the process is for finding SNF placement.  Patient lives alone and has a son and daughter.  Patient was explained how insurance will pay for stay and what to expect at SNF along with the process of discharge planning from SNF.  Patient gave CSW permission to begin bed search in [place].  He did not express any other questions or concerns about going to SNF.  Employment status:  Retired Database administrator PT Recommendations:  Skilled Nursing Facility Information / Referral to community resources:  Skilled Nursing Facility  Patient/Family's Response to care: Patient agreeable to going to SNF for short term rehab.  Patient/Family's  Understanding of and Emotional Response to Diagnosis, Current Treatment, and Prognosis:  Patient was lethargic, but expressed that he understands what to expect at SNF.  Patient is hopeful he will not have to be there very long. Emotional Assessment  Attitude/Demeanor/Rapport:   Positive Affect (typically observed):  Accepting, Appropriate, Calm, Stable Orientation:  Oriented to Self, Oriented to Place, Oriented to  Time, Oriented to Situation Alcohol / Substance use:  Not Applicable Psych involvement (Current and /or in the community):  No (Comment)  Discharge Needs  Concerns to be addressed:  Lack of Support Readmission within the last 30 days:  No Current discharge risk:  Lives alone Barriers to Discharge:  Insurance Authorization   [place] Constable 08/23/2017, 4:49 PM

## 2017-08-24 ENCOUNTER — Encounter
Admission: RE | Admit: 2017-08-24 | Discharge: 2017-08-24 | Disposition: A | Discharge status: Home / Self Care | Payer: Medicare Other | Source: Ambulatory Visit | Attending: Internal Medicine | Admitting: Internal Medicine

## 2017-08-24 LAB — GLUCOSE, CAPILLARY
GLUCOSE-CAPILLARY: 129 mg/dL — AB (ref 65–99)
GLUCOSE-CAPILLARY: 100 mg/dL — AB (ref 65–99)
GLUCOSE-CAPILLARY: 103 mg/dL — AB (ref 65–99)
Glucose-Capillary: 108 mg/dL — ABNORMAL HIGH (ref 65–99)

## 2017-08-24 NOTE — Plan of Care (Signed)
  Problem: Education: Goal: Knowledge of General Education information will improve Outcome: Progressing   Problem: Clinical Measurements: Goal: Ability to maintain clinical measurements within normal limits will improve Outcome: Progressing Goal: Cardiovascular complication will be avoided Outcome: Progressing

## 2017-08-24 NOTE — Plan of Care (Signed)
  Problem: Clinical Measurements: Goal: Ability to maintain clinical measurements within normal limits will improve Outcome: Not Progressing Note:  RBC value = only 4 today. Will continue to monitor lab values. Jari Favre [facility]

## 2017-08-24 NOTE — Progress Notes (Signed)
[facility]  Daily Progress Note   Subjective  - 5 Days Post-Op  Denies pain.  No neurological symptoms.  He continues to have some alteration of his vision.  Objective Vitals:   08/24/17 0421 08/24/17 0751 08/24/17 1522 08/24/17 1640  BP: (!) 142/79 (!) 153/77  124/69  Pulse: 68 69  72  Resp: 18 14  14   Temp: 98 F (36.7 C) 98.6 F (37 C)  98.6 F (37 C)  TempSrc: Oral Oral  Oral  SpO2: 99% 91%  95%  Weight:   100 kg (220 lb 8 oz)   Height:        Intake/Output Summary (Last 24 hours) at 08/24/2017 1712 Last data filed at 08/24/2017 1415 Gross per 24 hour  Intake 483 ml  Output 400 ml  Net 83 ml    PULM  Normal effort , no use of accessory muscles CV  No JVD, RRR Abd      No distended, nontender VASC  neck clean dry and intact incision healing well motor all 4 extremities intact speech is fluent  Laboratory CBC    Component Value Date/Time   WBC 10.6 08/21/2017 1542   HGB 12.7 (L) 08/21/2017 1542   HGB 13.7 09/22/2013 1832   HCT 37.0 (L) 08/21/2017 1542   HCT 41.0 09/22/2013 1832   PLT 170 08/21/2017 1542   PLT 146 (L) 09/22/2013 1832    BMET    Component Value Date/Time   NA 139 08/21/2017 1542   NA 139 09/22/2013 1832   K 3.8 08/21/2017 1542   K 3.8 09/22/2013 1832   CL 108 08/21/2017 1542   CL 106 09/22/2013 1832   CO2 25 08/21/2017 1542   CO2 25 09/22/2013 1832   GLUCOSE 187 (H) 08/21/2017 1542   GLUCOSE 162 (H) 09/22/2013 1832   BUN 16 08/21/2017 1542   BUN 16 09/22/2013 1832   CREATININE 0.74 08/21/2017 1542   CREATININE 0.91 09/22/2013 1832   CALCIUM 8.6 (L) 08/21/2017 1542   CALCIUM 8.5 09/22/2013 1832   GFRNONAA >60 08/21/2017 1542   GFRNONAA >60 09/22/2013 1832   GFRAA >60 08/21/2017 1542   GFRAA >60 09/22/2013 1832    Assessment/Planning: POD #5 s/p left carotid endarterectomy   I have spoken with neurology.  We will continue with the plan we do not feel that there is any further neurological evaluation  necessary at this time.  I will arrange for him to see ophthalmology as an outpatient.  At this point I feel he is fit for transfer to skilled nursing in the morning.    Levora Dredge  08/24/2017, 5:12 PM

## 2017-08-25 ENCOUNTER — Other Ambulatory Visit: Payer: Self-pay

## 2017-08-25 DIAGNOSIS — Z9989 Dependence on other enabling machines and devices: Secondary | ICD-10-CM | POA: Insufficient documentation

## 2017-08-25 LAB — GLUCOSE, CAPILLARY
Glucose-Capillary: 104 mg/dL — ABNORMAL HIGH (ref 65–99)
Glucose-Capillary: 109 mg/dL — ABNORMAL HIGH (ref 65–99)

## 2017-08-25 MED ORDER — PHENOBARBITAL 64.8 MG PO TABS: 129.6000 mg | ORAL_TABLET | Freq: Every day | ORAL | 1 refills | Status: DC

## 2017-08-25 NOTE — Care Management Important Message (Signed)
Copy of signed IM left in patient's room.    

## 2017-08-25 NOTE — Discharge Summary (Signed)
[facility]    Discharge Summary    Patient ID:  Edward Thompson MRN: [id number] DOB/AGE: 1950-10-09 67 y.o.  Admit date: 08/19/2017 Discharge date: 08/25/2017 Date of Surgery: 08/19/2017 Surgeon: Surgeon(s): Taler Kushner, Latina Craver, MD  Admission Diagnosis: CAROTID ARTERY STENOSIS  Discharge Diagnoses:  CAROTID ARTERY STENOSIS  Secondary Diagnoses: Past Medical History:  Diagnosis Date  . CAD (coronary artery disease)   . Carotid stenosis, left 08/2017  . Diabetes mellitus without complication (HCC)   . Fracture of neck (HCC) 2008   fell off a roof. required halo x 4 months. also fractured alot of vertebrae  . Heart attack (HCC) 2015  . Hyperlipidemia   . Hypertension   . Myocardial infarction acute (HCC)   . Seizures (HCC)    taking phenobarbitol and dilantin. LAST SEIZURE WAS 2016. well controlled on meds  . Sleep apnea    USES CPAP  . Syncope 2019  . Vertigo   . Viral meningitis     Procedure(s): ENDARTERECTOMY CAROTID  Discharged Condition: fair  HPI:  The patient presented on April 12 for elective left carotid endarterectomy.  Surgery was performed without incident.  Postoperatively he had a typical course in the recovery area and night of surgery he was seen and advancing as expected.  On the morning after surgery postoperative day #1 he was noted to be acting a little off per the family he was required.  Since to get out of bed he appeared to have some borderline pulmonary issues.  Workup including a head CT ultimately a carotid CT angiogram as well as chest x-ray did not demonstrate any significant findings.  Chest x-ray showed perhaps small amount of atelectasis.  Postoperative day #2 he began complaining of seeing things upside down.  Neurology was asked to evaluate him medicine was asked to see him as he continued to have some pulmonary issues.  Neurology felt that his visual disturbance is perhaps related to anesthesia or to a  intrinsic eye process.  Medicine felt that he did not have pneumonia his white count has been normal he has been afebrile he has been encouraged to continue incentive spirometry and is improved.  On postoperative day 5 he is felt to be ready for discharge to a skilled nursing secondary to his ongoing weakness.  His mental status is to return to baseline.  Hospital Course:  Edward Thompson is a 67 y.o. male is S/P Left Procedure(s): ENDARTERECTOMY CAROTID Extubated: POD # 0 Physical exam: Left neck incision clean dry and intact.  Neurologically he is moving all extremities with 5 out of 5 strength.  He is fluent in his speech.  He is appropriate in his thought process.  He just feels globally weak Post-op wounds clean, dry, intact or healing well Pt. Ambulating, voiding and taking PO diet without difficulty. Pt pain controlled with PO pain meds. Labs as below Complications: Visual changes and global weakness  Consults:  Treatment Team:  Thana Farr, MD Enedina Finner, MD  Significant Diagnostic Studies: CBC Lab Results  Component Value Date   WBC 10.6 08/21/2017   HGB 12.7 (L) 08/21/2017   HCT 37.0 (L) 08/21/2017   MCV 92.0 08/21/2017   PLT 170 08/21/2017    BMET    Component Value Date/Time   NA 139 08/21/2017 1542   NA 139 09/22/2013 1832   K 3.8 08/21/2017 1542   K 3.8 09/22/2013 1832   CL 108 08/21/2017 1542   CL 106 09/22/2013 1832  CO2 25 08/21/2017 1542   CO2 25 09/22/2013 1832   GLUCOSE 187 (H) 08/21/2017 1542   GLUCOSE 162 (H) 09/22/2013 1832   BUN 16 08/21/2017 1542   BUN 16 09/22/2013 1832   CREATININE 0.74 08/21/2017 1542   CREATININE 0.91 09/22/2013 1832   CALCIUM 8.6 (L) 08/21/2017 1542   CALCIUM 8.5 09/22/2013 1832   GFRNONAA >60 08/21/2017 1542   GFRNONAA >60 09/22/2013 1832   GFRAA >60 08/21/2017 1542   GFRAA >60 09/22/2013 1832   COAG Lab Results  Component Value Date   INR 1.11 08/12/2017   INR 1.08 02/05/2016   INR 1.1 09/22/2013      Disposition:  Discharge to :Skilled nursing facility Discharge Instructions    Call MD for:  redness, tenderness, or signs of infection (pain, swelling, bleeding, redness, odor or green/yellow discharge around incision site)   Complete by:  As directed    Call MD for:  severe or increased pain, loss or decreased feeling  in affected limb(s)   Complete by:  As directed    Call MD for:  temperature >100.5   Complete by:  As directed    Discharge instructions   Complete by:  As directed    Full weight bearing no restrictions   Driving Restrictions   Complete by:  As directed    No driving   Resume previous diet   Complete by:  As directed      Allergies as of 08/25/2017   No Known Allergies     Medication List    TAKE these medications   aspirin EC 81 MG tablet Take 81 mg by mouth at bedtime.   clopidogrel 75 MG tablet Commonly known as:  PLAVIX Take 75 mg by mouth at bedtime.   isosorbide mononitrate 30 MG 24 hr tablet Commonly known as:  IMDUR Take 30 mg by mouth at bedtime.   meclizine 25 MG tablet Commonly known as:  ANTIVERT Take 50 mg by mouth 2 (two) times daily.   metFORMIN 500 MG tablet Commonly known as:  GLUCOPHAGE Take 500 mg by mouth daily.   metoprolol succinate 50 MG 24 hr tablet Commonly known as:  TOPROL-XL Take 50 mg by mouth daily. Take with or immediately following a meal IN THE MORNING   NITROSTAT 0.4 MG SL tablet Generic drug:  nitroGLYCERIN Place 0.4 mg under the tongue every 5 (five) minutes as needed for chest pain.   PHENobarbital 64.8 MG tablet Commonly known as:  LUMINAL Take 129.6 mg by mouth at bedtime.   phenytoin 100 MG ER capsule Commonly known as:  DILANTIN Take 300 mg by mouth at bedtime.   rosuvastatin 40 MG tablet Commonly known as:  CRESTOR Take 40 mg by mouth at bedtime.      Verbal and written Discharge instructions given to the patient. Wound care per Discharge AVS  Contact information for follow-up  providers    Amery Vandenbos, Latina Craver, MD Follow up in 2 week(s).   Specialties:  Vascular Surgery, Cardiology, Radiology, Vascular Surgery Contact information: [address] [place] [address] [phone]            Contact information for after-discharge care    Destination    [facility] .   Service:  Skilled Nursing Contact information: [address] [place] [address] [phone]                  Signed: Levora Dredge, MD  08/25/2017, 8:22 AM

## 2017-08-25 NOTE — Progress Notes (Signed)
Pt prepared for discharge to [facility]. Report called to [facility] at the facility. EMS called for transport. I will continue to assess.

## 2017-08-25 NOTE — Clinical Social Work Note (Signed)
Patient to be d/c'ed today to [facility] room 205.  Patient and family agreeable to plans will transport via ems RN to call report [phone].  Patient did not want CSW to contact any of his family members.  Windell Moulding, MSW, Theresia Majors [phone]

## 2017-08-25 NOTE — Telephone Encounter (Signed)
Rx sent to [facility] phone : [phone] , fax : [phone]  

## 2017-08-25 NOTE — Clinical Social Work Placement (Signed)
   CLINICAL SOCIAL WORK PLACEMENT  NOTE  Date:  08/25/2017  Patient Details  Name: Edward Thompson MRN: [id number] Date of Birth: 1950/09/26  Clinical Social Work is seeking post-discharge placement for this patient at the Skilled  Nursing Facility level of care (*CSW will initial, date and re-position this form in  chart as items are completed):  Yes   Patient/family provided with [facility] Clinical Social Work Department's list of facilities offering this level of care within the geographic area requested by the patient (or if unable, by the patient's family).  Yes   Patient/family informed of their freedom to choose among providers that offer the needed level of care, that participate in Medicare, Medicaid or managed care program needed by the patient, have an available bed and are willing to accept the patient.  Yes   Patient/family informed of [facility]'s ownership interest in [facility] and [facility], as well as of the fact that they are under no obligation to receive care at these facilities.  PASRR submitted to EDS on 08/23/17     PASRR number received on       Existing PASRR number confirmed on 08/23/17     FL2 transmitted to all facilities in geographic area requested by pt/family on 08/23/17     FL2 transmitted to all facilities within larger geographic area on       Patient informed that his/her managed care company has contracts with or will negotiate with certain facilities, including the following:        Yes   Patient/family informed of bed offers received.  Patient chooses bed at [facility]     Physician recommends and patient chooses bed at      Patient to be transferred to [facility] on 08/25/17.  Patient to be transferred to facility by [facility]     Patient family notified on 08/25/17 of transfer.  Name of family member notified:  Patient did not want CSW to notify any of his family, he will let them know.      PHYSICIAN       Additional Comment:    _______________________________________________ Darleene Cleaver, LCSWA 08/25/2017, 12:06 PM

## 2017-09-07 ENCOUNTER — Encounter
Admission: RE | Admit: 2017-09-07 | Discharge: 2017-09-07 | Disposition: A | Discharge status: Home / Self Care | Payer: Medicare Other | Source: Ambulatory Visit | Attending: Internal Medicine | Admitting: Internal Medicine

## 2017-09-08 ENCOUNTER — Non-Acute Institutional Stay (SKILLED_NURSING_FACILITY): Payer: Medicare Other | Admitting: Gerontology

## 2017-09-08 ENCOUNTER — Ambulatory Visit (INDEPENDENT_AMBULATORY_CARE_PROVIDER_SITE_OTHER): Payer: Medicare Other | Admitting: Vascular Surgery

## 2017-09-08 ENCOUNTER — Encounter: Payer: Self-pay | Admitting: Gerontology

## 2017-09-08 ENCOUNTER — Encounter (INDEPENDENT_AMBULATORY_CARE_PROVIDER_SITE_OTHER): Payer: Self-pay | Admitting: Vascular Surgery

## 2017-09-08 VITALS — BP 124/77 | HR 78 | Resp 16 | Ht 64.0 in | Wt 218.0 lb

## 2017-09-08 DIAGNOSIS — I6522 Occlusion and stenosis of left carotid artery: Secondary | ICD-10-CM

## 2017-09-08 DIAGNOSIS — M21372 Foot drop, left foot: Secondary | ICD-10-CM | POA: Diagnosis not present

## 2017-09-08 DIAGNOSIS — M21371 Foot drop, right foot: Secondary | ICD-10-CM

## 2017-09-08 DIAGNOSIS — Z9889 Other specified postprocedural states: Secondary | ICD-10-CM

## 2017-09-08 NOTE — Progress Notes (Signed)
Location:   [facility] Room Number: 205A Place of Service:  SNF [phone]) Provider:  Lorenso Quarry, NP-C  Sherron Monday, MD  Patient Care Team: Sherron Monday, MD as PCP - General (Internal Medicine)  Extended Emergency Contact Information Primary Emergency Contact: Timmothy Euler [place] Home Phone: [phone] Relation: Daughter Secondary Emergency Contact: Antwyne, [place] Mobile Phone: [phone] Relation: Son  Code Status:  FULL Goals of care: Advanced Directive information Advanced Directives 09/08/2017  Does Patient Have a Medical Advance Directive? No  Type of Advance Directive -  Does patient want to make changes to medical advance directive? No - Patient declined  Would patient like information on creating a medical advance directive? -  Pre-existing out of facility DNR order (yellow form or pink MOST form) -     Chief Complaint  Patient presents with  . Medical Management of Chronic Issues    Routine Visit    HPI:  Pt is a 67 y.o. male seen today for medical management of chronic diseases. Pt was admitted to the facility for rehab following hospitalization for Carotid Artery Stenosis, s/p Left Carotid Endarterectomy with generalized weakness and deconditioning. Pt has been working with PT/OT. Pt has been progressing fairly well. However, he was noted to have Bilateral foot drop. He was referred by Therapy to Ortho for AFO Braces. He had a fitting at [facility] for the AFO braces today. Pt reports his pain is well controlled on current regimen. Appetite is good. He is voiding well and having regular BMs. Pt does continue to refuse nursing care with Hygiene. Incision well approximated. No redness, no drainage. VSS. No other complaints.        Past Medical History:  Diagnosis Date  . CAD (coronary artery disease)   . Carotid stenosis, left 08/2017  . Diabetes mellitus without complication (HCC)   . Fracture  of neck (HCC) 2008   fell off a roof. required halo x 4 months. also fractured alot of vertebrae  . Heart attack (HCC) 2015  . Heart disease   . Hepatitis    6th grade   . Hyperlipidemia   . Hypertension   . Myocardial infarction acute (HCC) 2015  . Seizures (HCC)    taking phenobarbitol and dilantin. LAST SEIZURE WAS 2016. well controlled on meds  . Sleep apnea    USES CPAP  . Syncope 2019  . Vertigo   . Viral meningitis    Past Surgical History:  Procedure Laterality Date  . CARDIAC SURGERY    . CORONARY ANGIOPLASTY     PATIENT UNAWARE OF THIS  . CORONARY ARTERY BYPASS GRAFT  2015  . ENDARTERECTOMY Left 08/19/2017   Procedure: ENDARTERECTOMY CAROTID;  Surgeon: Renford Dills, MD;  Location: [facility] ORS;  Service: Vascular;  Laterality: Left;  . HERNIA REPAIR Right 1985   inguinal    No Known Allergies  Allergies as of 09/08/2017   No Known Allergies     Medication List        Accurate as of 09/08/17 12:16 PM. Always use your most recent med list.          aspirin EC 81 MG tablet Take 81 mg by mouth at bedtime.   clopidogrel 75 MG tablet Commonly known as:  PLAVIX Take 75 mg by mouth at bedtime.   isosorbide mononitrate 30 MG 24 hr tablet Commonly known as:  IMDUR Take 30 mg by mouth at bedtime.   meclizine 25 MG  tablet Commonly known as:  ANTIVERT Take 50 mg by mouth 2 (two) times daily.   metFORMIN 500 MG tablet Commonly known as:  GLUCOPHAGE Take 500 mg by mouth daily.   metoprolol succinate 50 MG 24 hr tablet Commonly known as:  TOPROL-XL Take 50 mg by mouth daily. Take with or immediately following a meal IN THE MORNING   NITROSTAT 0.4 MG SL tablet Generic drug:  nitroGLYCERIN Place 0.4 mg under the tongue every 5 (five) minutes as needed for chest pain.   PHENobarbital 64.8 MG tablet Commonly known as:  LUMINAL Take 2 tablets (129.6 mg total) by mouth at bedtime.   phenytoin 100 MG ER capsule Commonly known as:  DILANTIN Take 300 mg by  mouth at bedtime.   rosuvastatin 40 MG tablet Commonly known as:  CRESTOR Take 40 mg by mouth at bedtime.       Review of Systems  Constitutional: Negative for activity change, appetite change, chills, diaphoresis and fever.  HENT: Negative for congestion, mouth sores, nosebleeds, postnasal drip, sneezing, sore throat, trouble swallowing and voice change.   Respiratory: Negative for apnea, cough, choking, chest tightness, shortness of breath and wheezing.   Cardiovascular: Negative for chest pain, palpitations and leg swelling.  Gastrointestinal: Negative for abdominal distention, abdominal pain, constipation, diarrhea and nausea.  Genitourinary: Negative for difficulty urinating, dysuria, frequency and urgency.  Musculoskeletal: Positive for gait problem. Negative for back pain and myalgias. Arthralgias: typical arthritis.  Skin: Negative for color change, pallor, rash and wound.  Neurological: Positive for weakness. Negative for dizziness, tremors, syncope, speech difficulty, numbness and headaches.  Psychiatric/Behavioral: Negative for agitation and behavioral problems.  All other systems reviewed and are negative.   Immunization History  Administered Date(s) Administered  . Influenza,inj,quad, With Preservative 03/10/2016  . Pneumococcal Polysaccharide-23 08/20/2017  . Td 03/26/2008   Pertinent  Health Maintenance Due  Topic Date Due  . FOOT EXAM  04/22/1960  . OPHTHALMOLOGY EXAM  04/22/1960  . URINE MICROALBUMIN  04/22/1960  . COLONOSCOPY  04/22/2000  . INFLUENZA VACCINE  12/08/2017  . HEMOGLOBIN A1C  02/21/2018  . PNA vac Low Risk Adult (2 of 2 - PCV13) 08/21/2018   No flowsheet data found. Functional Status Survey:    Vitals:   09/08/17 1205  BP: 131/63  Pulse: 74  Resp: 18  Temp: 98 F (36.7 C)  TempSrc: Oral  SpO2: 95%  Weight: 218 lb 9.6 oz (99.2 kg)  Height: 5\' 4"  (1.626 m)   Body mass index is 37.52 kg/m. Physical Exam  Constitutional: He is  oriented to person, place, and time. Vital signs are normal. He appears well-developed and well-nourished. He is active and cooperative. He does not appear ill. No distress.  HENT:  Head: Normocephalic and atraumatic.  Mouth/Throat: Uvula is midline, oropharynx is clear and moist and mucous membranes are normal. Mucous membranes are not pale, not dry and not cyanotic.  Eyes: Pupils are equal, round, and reactive to light. Conjunctivae, EOM and lids are normal.  Neck: Trachea normal, normal range of motion and full passive range of motion without pain. Neck supple. No JVD present. No tracheal deviation, no edema and no erythema present. No thyromegaly present.  Cardiovascular: Normal rate, regular rhythm, normal heart sounds, intact distal pulses and normal pulses. Exam reveals no gallop, no distant heart sounds and no friction rub.  No murmur heard. Pulses:      Dorsalis pedis pulses are 2+ on the right side, and 2+ on the left side.  No edema  Pulmonary/Chest: Effort normal and breath sounds normal. No accessory muscle usage. No respiratory distress. He has no decreased breath sounds. He has no wheezes. He has no rhonchi. He has no rales. He exhibits no tenderness.  Abdominal: Soft. Normal appearance and bowel sounds are normal. He exhibits no distension and no ascites. There is no tenderness.  Musculoskeletal: Normal range of motion. He exhibits no edema or tenderness.  Expected osteoarthritis, stiffness; Bilateral Calves soft, supple. Negative Homan's Sign. B- pedal pulses equal; generalized weakness; B-foot drop  Neurological: He is alert and oriented to person, place, and time. He has normal strength. He exhibits abnormal muscle tone. Coordination abnormal.  Skin: Skin is warm, dry and intact. He is not diaphoretic. No cyanosis. No pallor. Nails show no clubbing.  Psychiatric: His speech is normal. Judgment and thought content normal. His affect is blunt. He is slowed. Cognition and memory are  normal.  Nursing note and vitals reviewed.   Labs reviewed: Recent Labs    08/12/17 1134 08/20/17 0433 08/21/17 1542  NA 136 138 139  K 4.2 4.4 3.8  CL 103 110 108  CO2 21* 24 25  GLUCOSE 120* 158* 187*  BUN 15 12 16   CREATININE 0.70 0.68 0.74  CALCIUM 9.1 8.4* 8.6*   No results for input(s): AST, ALT, ALKPHOS, BILITOT, PROT, ALBUMIN in the last 8760 hours. Recent Labs    08/12/17 1134 08/20/17 0433 08/21/17 1542  WBC 8.5 11.3* 10.6  NEUTROABS 5.7  --   --   HGB 13.9 12.8* 12.7*  HCT 41.6 36.9* 37.0*  MCV 90.9 92.2 92.0  PLT 187 157 170   No results found for: TSH Lab Results  Component Value Date   HGBA1C 5.5 08/22/2017   Lab Results  Component Value Date   CHOL 133 02/06/2016   HDL 38 (L) 02/06/2016   LDLCALC 67 02/06/2016   TRIG 138 02/06/2016   CHOLHDL 3.5 02/06/2016    Significant Diagnostic Results in last 30 days:  Ct Angio Head W Or Wo Contrast  Result Date: 08/21/2017 CLINICAL DATA:  Status post carotid endarterectomy. Episode of vision change in both eyes. EXAM: CT ANGIOGRAPHY HEAD AND NECK TECHNIQUE: Multidetector CT imaging of the head and neck was performed using the standard protocol during bolus administration of intravenous contrast. Multiplanar CT image reconstructions and MIPs were obtained to evaluate the vascular anatomy. Carotid stenosis measurements (when applicable) are obtained utilizing NASCET criteria, using the distal internal carotid diameter as the denominator. CONTRAST:  75mL OMNIPAQUE IOHEXOL 350 MG/ML SOLN COMPARISON:  Head CT from yesterday. Brain MRI 02/06/2016. Neck CTA 02/06/2016 FINDINGS: CT HEAD FINDINGS Brain: No evidence of acute infarction, hemorrhage, hydrocephalus, extra-axial collection or mass lesion/mass effect. Mild patchy low-density in the cerebral white matter is stable and best attributed to chronic small vessel ischemia and dilated perivascular spaces based on 2017 brain MRI. Vascular: See below Skull: Negative.  Sinuses: Stable small volume secretions in the sphenoid sinuses. Orbits: Negative Review of the MIP images confirms the above findings CTA NECK FINDINGS Aortic arch: Atherosclerotic calcification. 3 vessel branching pattern. Right carotid system: Atherosclerotic plaque at the bifurcation with up to 35% proximal ICA narrowing. The ICA is tortuous. No beading or dissection is seen. Left carotid system: Fluid and to a lesser extent gas around the carotid sheath extending into the lateral neck, correlating with history of recent carotid endarterectomy. The vessel is smooth and diffusely patent. Vertebral arteries: No proximal subclavian stenosis despite atherosclerosis. Codominant vertebral arteries. There  is moderate narrowing of the left vertebral artery at the origin, stenosis measured at 50%. No beading or dissection is seen. Skeleton: Negative. Other neck: Expected postoperative changes in the left neck. 9 mm left thyroid nodule below size threshold for strict sonographic follow-up per consensus guidelines. Upper chest: Atelectasis and trace pleural fluid. Review of the MIP images confirms the above findings CTA HEAD FINDINGS Anterior circulation: Atherosclerotic plaque on the carotid siphons. The left ICA is smaller than the right in the setting of left A1 hypoplasia. No flow limiting stenosis or branch occlusion is seen. Of the negative for aneurysm. Posterior circulation: Extensive atherosclerotic plaque on the V4 segments with moderate to advanced narrowing bilaterally, worse on the left. These narrowings are over accentuated by small vessel size and calcified plaque blooming. Calcified plaque on the basilar without stenosis. No flow seen beyond the right P1 2 junction. There is some enhancing right V4 branches with nonvisualized collaterals. Venous sinuses: Patent Anatomic variants: As above Delayed phase: No abnormal parenchymal enhancement. These results were called by telephone at the time of interpretation  on 08/21/2017 at 5:50 pm to Dr. Coral Else , who verbally acknowledged these results. Review of the MIP images confirms the above findings IMPRESSION: 1. Expected appearance of left carotid endarterectomy. No visible embolic disease. 2. Intracranial atherosclerosis with proximal right PCA occlusion. No evidence of associated infarct. 3. 35% atheromatous narrowing at the right ICA bulb. 4. 50% narrowing of the left vertebral origin. Moderate to advanced bilateral V4 segment stenoses. 5. Atelectasis and trace pleural fluid. Electronically Signed   By: Marnee Spring M.D.   On: 08/21/2017 17:52   Ct Head Wo Contrast  Result Date: 08/20/2017 CLINICAL DATA:  Visual disturbance and lethargy since carotid endarterectomy yesterday. EXAM: CT HEAD WITHOUT CONTRAST TECHNIQUE: Contiguous axial images were obtained from the base of the skull through the vertex without intravenous contrast. COMPARISON:  02/06/2016 brain MRI FINDINGS: Brain: No evidence of acute infarction, hemorrhage, hydrocephalus, extra-axial collection or mass lesion/mass effect. Mild patchy low-density in the cerebral white matter attributed to chronic small vessel ischemia. Vascular: Extensive atherosclerotic calcification. No hyperdense vessel. Skull: No evidence of acute or aggressive process. Sinuses/Orbits: Mild generalized mucosal thickening with small sphenoid fluid levels. IMPRESSION: 1. No acute finding.  No visible infarct. 2. Mild chronic small vessel ischemia in the cerebral white matter. Electronically Signed   By: Marnee Spring M.D.   On: 08/20/2017 19:20   Ct Angio Neck W Or Wo Contrast  Result Date: 08/21/2017 CLINICAL DATA:  Status post carotid endarterectomy. Episode of vision change in both eyes. EXAM: CT ANGIOGRAPHY HEAD AND NECK TECHNIQUE: Multidetector CT imaging of the head and neck was performed using the standard protocol during bolus administration of intravenous contrast. Multiplanar CT image reconstructions and MIPs  were obtained to evaluate the vascular anatomy. Carotid stenosis measurements (when applicable) are obtained utilizing NASCET criteria, using the distal internal carotid diameter as the denominator. CONTRAST:  75mL OMNIPAQUE IOHEXOL 350 MG/ML SOLN COMPARISON:  Head CT from yesterday. Brain MRI 02/06/2016. Neck CTA 02/06/2016 FINDINGS: CT HEAD FINDINGS Brain: No evidence of acute infarction, hemorrhage, hydrocephalus, extra-axial collection or mass lesion/mass effect. Mild patchy low-density in the cerebral white matter is stable and best attributed to chronic small vessel ischemia and dilated perivascular spaces based on 2017 brain MRI. Vascular: See below Skull: Negative. Sinuses: Stable small volume secretions in the sphenoid sinuses. Orbits: Negative Review of the MIP images confirms the above findings CTA NECK FINDINGS Aortic arch: Atherosclerotic calcification.  3 vessel branching pattern. Right carotid system: Atherosclerotic plaque at the bifurcation with up to 35% proximal ICA narrowing. The ICA is tortuous. No beading or dissection is seen. Left carotid system: Fluid and to a lesser extent gas around the carotid sheath extending into the lateral neck, correlating with history of recent carotid endarterectomy. The vessel is smooth and diffusely patent. Vertebral arteries: No proximal subclavian stenosis despite atherosclerosis. Codominant vertebral arteries. There is moderate narrowing of the left vertebral artery at the origin, stenosis measured at 50%. No beading or dissection is seen. Skeleton: Negative. Other neck: Expected postoperative changes in the left neck. 9 mm left thyroid nodule below size threshold for strict sonographic follow-up per consensus guidelines. Upper chest: Atelectasis and trace pleural fluid. Review of the MIP images confirms the above findings CTA HEAD FINDINGS Anterior circulation: Atherosclerotic plaque on the carotid siphons. The left ICA is smaller than the right in the setting  of left A1 hypoplasia. No flow limiting stenosis or branch occlusion is seen. Of the negative for aneurysm. Posterior circulation: Extensive atherosclerotic plaque on the V4 segments with moderate to advanced narrowing bilaterally, worse on the left. These narrowings are over accentuated by small vessel size and calcified plaque blooming. Calcified plaque on the basilar without stenosis. No flow seen beyond the right P1 2 junction. There is some enhancing right V4 branches with nonvisualized collaterals. Venous sinuses: Patent Anatomic variants: As above Delayed phase: No abnormal parenchymal enhancement. These results were called by telephone at the time of interpretation on 08/21/2017 at 5:50 pm to Dr. Coral Else , who verbally acknowledged these results. Review of the MIP images confirms the above findings IMPRESSION: 1. Expected appearance of left carotid endarterectomy. No visible embolic disease. 2. Intracranial atherosclerosis with proximal right PCA occlusion. No evidence of associated infarct. 3. 35% atheromatous narrowing at the right ICA bulb. 4. 50% narrowing of the left vertebral origin. Moderate to advanced bilateral V4 segment stenoses. 5. Atelectasis and trace pleural fluid. Electronically Signed   By: Marnee Spring M.D.   On: 08/21/2017 17:52   Dg Chest Port 1 View  Result Date: 08/20/2017 CLINICAL DATA:  Altered mental status, history hypertension, diabetes mellitus, coronary artery disease post CABG and MI EXAM: PORTABLE CHEST 1 VIEW COMPARISON:  Portable exam 1845 hours compared to 02/05/2016 FINDINGS: Enlargement of cardiac silhouette. Rotated to the RIGHT. Mediastinal contours normal. LEFT lower lobe atelectasis versus consolidation. Subsegmental atelectasis RIGHT base. Upper lungs clear. Atherosclerotic calcification aorta. No pleural effusion or pneumothorax. IMPRESSION: Mild enlargement of cardiac silhouette. RIGHT basilar atelectasis with additional atelectasis versus  consolidation/pneumonia in LEFT lower lobe. Electronically Signed   By: Ulyses Southward M.D.   On: 08/20/2017 19:03    Assessment/Plan  Stenosis of left carotid artery  S/P carotid endarterectomy  Foot drop, bilateral   Continue PT/OT  Continue exercises as taught by PT/OT  AFO Braces when upright/out of bed when they are available- order sent to [facility]  Continue medications per current regimen  Skin care per protocol  Continue to encourage pt to perform self-care/ hygiene  Follow up with Vascular MD as instructed  Family/ staff Communication:   Total Time:  Documentation:  Face to Face:  Family/Phone:   Labs/tests ordered:    Medication list reviewed and assessed for continued appropriateness. Monthly medication orders reviewed and signed.  Brynda Rim, NP-C Geriatrics [facility] [phone], [place] [address] Cell Phone (Mon-Fri 8am-5pm):  [phone] On Call:  [phone] & follow prompts after 5pm & weekends Office Phone:  [phone] Office Fax:  [phone]

## 2017-09-08 NOTE — Progress Notes (Signed)
Patient ID: Edward Thompson, male   DOB: 1951-04-02, 67 y.o.   MRN: [id number]  Chief Complaint  Patient presents with  . Follow-up    [facility] 2 week     HPI Edward Thompson is a 67 y.o. male.    Patient returns to the office for follow-up status post left carotid endarterectomy.  His postoperative course was complicated with double vision.  Work-up at that time including CT angiography demonstrated no evidence for stroke or embolic complication.  Left carotid endarterectomy was widely patent.  Right internal carotid demonstrated 35% stenosis.  He states his vision has now completely cleared.  It should be noted he has an extensive history of vertigo as well as a seizure history which has been very difficult to treat.  Past Medical History:  Diagnosis Date  . CAD (coronary artery disease)   . Carotid stenosis, left 08/2017  . Diabetes mellitus without complication (HCC)   . Fracture of neck (HCC) 2008   fell off a roof. required halo x 4 months. also fractured alot of vertebrae  . Heart attack (HCC) 2015  . Heart disease   . Hepatitis    6th grade   . Hyperlipidemia   . Hypertension   . Myocardial infarction acute (HCC) 2015  . Seizures (HCC)    taking phenobarbitol and dilantin. LAST SEIZURE WAS 2016. well controlled on meds  . Sleep apnea    USES CPAP  . Syncope 2019  . Vertigo   . Viral meningitis     Past Surgical History:  Procedure Laterality Date  . CARDIAC SURGERY    . CORONARY ANGIOPLASTY     PATIENT UNAWARE OF THIS  . CORONARY ARTERY BYPASS GRAFT  2015  . ENDARTERECTOMY Left 08/19/2017   Procedure: ENDARTERECTOMY CAROTID;  Surgeon: Renford Dills, MD;  Location: [facility] ORS;  Service: Vascular;  Laterality: Left;  . HERNIA REPAIR Right 1985   inguinal      No Known Allergies  Current Outpatient Medications  Medication Sig Dispense Refill  . aspirin EC 81 MG tablet Take 81 mg by mouth at bedtime.     . clopidogrel (PLAVIX) 75 MG tablet Take 75 mg by  mouth at bedtime.     . isosorbide mononitrate (IMDUR) 30 MG 24 hr tablet Take 30 mg by mouth at bedtime.     . meclizine (ANTIVERT) 25 MG tablet Take 50 mg by mouth 2 (two) times daily.     . metFORMIN (GLUCOPHAGE) 500 MG tablet Take 500 mg by mouth daily.     . metoprolol succinate (TOPROL-XL) 50 MG 24 hr tablet Take 50 mg by mouth daily. Take with or immediately following a meal IN THE MORNING    . nitroGLYCERIN (NITROSTAT) 0.4 MG SL tablet Place 0.4 mg under the tongue every 5 (five) minutes as needed for chest pain.     Marland Kitchen PHENobarbital (LUMINAL) 64.8 MG tablet Take 2 tablets (129.6 mg total) by mouth at bedtime. 60 tablet 1  . phenytoin (DILANTIN) 100 MG ER capsule Take 300 mg by mouth at bedtime.     . rosuvastatin (CRESTOR) 40 MG tablet Take 40 mg by mouth at bedtime.      No current facility-administered medications for this visit.         Physical Exam BP 124/77 (BP Location: Left Arm)   Pulse 78   Resp 16   Ht 5\' 4"  (1.626 m)   Wt 218 lb (98.9 kg)   BMI 37.42  kg/m  Gen:  WD/WN, NAD Skin: incision C/D/I, neurological exam is back to baseline     Assessment/Plan:  1. Symptomatic stenosis of left carotid artery Recommend:  The patient is s/p successful left CEA  Duplex ultrasound preoperatively shows 35% contralateral stenosis.  Continue antiplatelet therapy as prescribed Continue management of CAD, HTN and Hyperlipidemia Healthy heart diet,  encouraged exercise at least 4 times per week  Follow up in 6 months with duplex ultrasound and physical exam based on the patient's carotid surgery and <50% stenosis of the right carotid artery        Levora Dredge 09/08/2017, 2:30 PM   This note was created with Dragon medical transcription system.  Any errors from dictation are unintentional.

## 2017-09-09 DIAGNOSIS — M21372 Foot drop, left foot: Secondary | ICD-10-CM

## 2017-09-09 DIAGNOSIS — Z9889 Other specified postprocedural states: Secondary | ICD-10-CM | POA: Insufficient documentation

## 2017-09-09 DIAGNOSIS — M21371 Foot drop, right foot: Secondary | ICD-10-CM | POA: Insufficient documentation

## 2017-09-16 ENCOUNTER — Non-Acute Institutional Stay (SKILLED_NURSING_FACILITY): Payer: Medicare Other | Admitting: Gerontology

## 2017-09-16 ENCOUNTER — Encounter: Payer: Self-pay | Admitting: Gerontology

## 2017-09-16 DIAGNOSIS — I6522 Occlusion and stenosis of left carotid artery: Secondary | ICD-10-CM | POA: Diagnosis not present

## 2017-09-16 DIAGNOSIS — M21372 Foot drop, left foot: Secondary | ICD-10-CM

## 2017-09-16 DIAGNOSIS — Z9889 Other specified postprocedural states: Secondary | ICD-10-CM

## 2017-09-16 DIAGNOSIS — M21371 Foot drop, right foot: Secondary | ICD-10-CM | POA: Diagnosis not present

## 2017-09-16 NOTE — Progress Notes (Signed)
Location:   [facility] Room Number: 205A Place of Service:  SNF [phone]) Provider:  Lorenso Quarry, NP-C  Sherron Monday, MD  Patient Care Team: Sherron Monday, MD as PCP - General (Internal Medicine)  Extended Emergency Contact Information Primary Emergency Contact: Timmothy Euler [place] Home Phone: [phone] Relation: Daughter Secondary Emergency Contact: Brek, Reece Mobile Phone: [phone] Relation: Son  Code Status:  FULL Goals of care: Advanced Directive information Advanced Directives 09/16/2017  Does Patient Have a Medical Advance Directive? No  Type of Advance Directive -  Does patient want to make changes to medical advance directive? No - Patient declined  Would patient like information on creating a medical advance directive? -  Pre-existing out of facility DNR order (yellow form or pink MOST form) -     Chief Complaint  Patient presents with  . Medical Management of Chronic Issues    Routine Visit    HPI:  Pt is a 67 y.o. male seen today for medical management of chronic diseases. Pt was admitted to the facility for rehab following hospitalization for Left Carotid Artery Stenosis with Carotid Endarterectomy. Pt has been participating in PT/OT. Pt has been progressing well. Pt denies pain, chest pain or shortness of breath. Reports appetite is good, voiding well and having regular BMs. VSS. No other complaints. AFO Braces have been ordered for the Foot Drop. Pending delivery.      Past Medical History:  Diagnosis Date  . CAD (coronary artery disease)   . Carotid stenosis, left 08/2017  . Diabetes mellitus without complication (HCC)   . Fracture of neck (HCC) 2008   fell off a roof. required halo x 4 months. also fractured alot of vertebrae  . Heart attack (HCC) 2015  . Heart disease   . Hepatitis    6th grade   . Hyperlipidemia   . Hypertension   . Myocardial infarction acute (HCC) 2015  .  Seizures (HCC)    taking phenobarbitol and dilantin. LAST SEIZURE WAS 2016. well controlled on meds  . Sleep apnea    USES CPAP  . Syncope 2019  . Vertigo   . Viral meningitis    Past Surgical History:  Procedure Laterality Date  . CARDIAC SURGERY    . CORONARY ANGIOPLASTY     PATIENT UNAWARE OF THIS  . CORONARY ARTERY BYPASS GRAFT  2015  . ENDARTERECTOMY Left 08/19/2017   Procedure: ENDARTERECTOMY CAROTID;  Surgeon: Renford Dills, MD;  Location: [facility] ORS;  Service: Vascular;  Laterality: Left;  . HERNIA REPAIR Right 1985   inguinal    No Known Allergies  Allergies as of 09/16/2017   No Known Allergies     Medication List        Accurate as of 09/16/17 11:18 AM. Always use your most recent med list.          aspirin EC 81 MG tablet Take 81 mg by mouth at bedtime.   clopidogrel 75 MG tablet Commonly known as:  PLAVIX Take 75 mg by mouth at bedtime.   isosorbide mononitrate 30 MG 24 hr tablet Commonly known as:  IMDUR Take 30 mg by mouth at bedtime.   meclizine 25 MG tablet Commonly known as:  ANTIVERT Take 50 mg by mouth 2 (two) times daily.   metFORMIN 500 MG tablet Commonly known as:  GLUCOPHAGE Take 500 mg by mouth daily.   metoprolol succinate 50 MG 24 hr tablet Commonly known as:  TOPROL-XL  Take 50 mg by mouth daily. Take with or immediately following a meal IN THE MORNING   NITROSTAT 0.4 MG SL tablet Generic drug:  nitroGLYCERIN Place 0.4 mg under the tongue every 5 (five) minutes as needed for chest pain.   PHENobarbital 64.8 MG tablet Commonly known as:  LUMINAL Take 2 tablets (129.6 mg total) by mouth at bedtime.   phenytoin 100 MG ER capsule Commonly known as:  DILANTIN Take 300 mg by mouth at bedtime.   rosuvastatin 40 MG tablet Commonly known as:  CRESTOR Take 40 mg by mouth at bedtime.       Review of Systems  Constitutional: Negative for activity change, appetite change, chills, diaphoresis and fever.  HENT: Negative for  congestion, mouth sores, nosebleeds, postnasal drip, sneezing, sore throat, trouble swallowing and voice change.   Respiratory: Negative for apnea, cough, choking, chest tightness, shortness of breath and wheezing.   Cardiovascular: Negative for chest pain, palpitations and leg swelling.  Gastrointestinal: Negative for abdominal distention, abdominal pain, constipation, diarrhea and nausea.  Genitourinary: Negative for difficulty urinating, dysuria, frequency and urgency.  Musculoskeletal: Negative for back pain, gait problem and myalgias. Arthralgias: typical arthritis.  Skin: Positive for wound. Negative for color change, pallor and rash.  Neurological: Positive for weakness. Negative for dizziness, tremors, syncope, speech difficulty, numbness and headaches.  Psychiatric/Behavioral: Negative for agitation and behavioral problems.  All other systems reviewed and are negative.   Immunization History  Administered Date(s) Administered  . Influenza,inj,quad, With Preservative 03/10/2016  . Pneumococcal Polysaccharide-23 08/20/2017  . Td 03/26/2008   Pertinent  Health Maintenance Due  Topic Date Due  . FOOT EXAM  04/22/1960  . OPHTHALMOLOGY EXAM  04/22/1960  . URINE MICROALBUMIN  04/22/1960  . COLONOSCOPY  04/22/2000  . INFLUENZA VACCINE  12/08/2017  . HEMOGLOBIN A1C  02/21/2018  . PNA vac Low Risk Adult (2 of 2 - PCV13) 08/21/2018   No flowsheet data found. Functional Status Survey:    Vitals:   09/16/17 1112  BP: 122/60  Pulse: 74  Resp: 18  Temp: 98.2 F (36.8 C)  TempSrc: Oral  SpO2: 94%  Weight: 217 lb 6.4 oz (98.6 kg)  Height: 5\' 4"  (1.626 m)   Body mass index is 37.32 kg/m. Physical Exam  Constitutional: He is oriented to person, place, and time. Vital signs are normal. He appears well-developed and well-nourished. He is active and cooperative. He does not appear ill. No distress.  HENT:  Head: Normocephalic and atraumatic.  Mouth/Throat: Uvula is midline,  oropharynx is clear and moist and mucous membranes are normal. Mucous membranes are not pale, not dry and not cyanotic.  Eyes: Pupils are equal, round, and reactive to light. Conjunctivae, EOM and lids are normal.  Neck: Trachea normal, normal range of motion and full passive range of motion without pain. Neck supple. No JVD present. No tracheal deviation, no edema and no erythema present. No thyromegaly present.  Cardiovascular: Normal rate, regular rhythm, normal heart sounds, intact distal pulses and normal pulses. Exam reveals no gallop, no distant heart sounds and no friction rub.  No murmur heard. Pulses:      Dorsalis pedis pulses are 2+ on the right side, and 2+ on the left side.  No edema  Pulmonary/Chest: Effort normal and breath sounds normal. No accessory muscle usage. No respiratory distress. He has no decreased breath sounds. He has no wheezes. He has no rhonchi. He has no rales. He exhibits no tenderness.  Abdominal: Soft. Normal appearance and bowel  sounds are normal. He exhibits no distension and no ascites. There is no tenderness.  Musculoskeletal: Normal range of motion. He exhibits no edema or tenderness.  Expected osteoarthritis, stiffness; Bilateral Calves soft, supple. Negative Homan's Sign. B- pedal pulses equal  Neurological: He is alert and oriented to person, place, and time. He has normal strength. He displays atrophy. He exhibits abnormal muscle tone. Coordination abnormal.  B- foot drop  Skin: Skin is warm and dry. Laceration noted. He is not diaphoretic. No cyanosis. No pallor. Nails show no clubbing.  Psychiatric: He has a normal mood and affect. His speech is normal and behavior is normal. Judgment and thought content normal. Cognition and memory are normal.  Nursing note and vitals reviewed.   Labs reviewed: Recent Labs    08/12/17 1134 08/20/17 0433 08/21/17 1542  NA 136 138 139  K 4.2 4.4 3.8  CL 103 110 108  CO2 21* 24 25  GLUCOSE 120* 158* 187*  BUN  15 12 16   CREATININE 0.70 0.68 0.74  CALCIUM 9.1 8.4* 8.6*   No results for input(s): AST, ALT, ALKPHOS, BILITOT, PROT, ALBUMIN in the last 8760 hours. Recent Labs    08/12/17 1134 08/20/17 0433 08/21/17 1542  WBC 8.5 11.3* 10.6  NEUTROABS 5.7  --   --   HGB 13.9 12.8* 12.7*  HCT 41.6 36.9* 37.0*  MCV 90.9 92.2 92.0  PLT 187 157 170   No results found for: TSH Lab Results  Component Value Date   HGBA1C 5.5 08/22/2017   Lab Results  Component Value Date   CHOL 133 02/06/2016   HDL 38 (L) 02/06/2016   LDLCALC 67 02/06/2016   TRIG 138 02/06/2016   CHOLHDL 3.5 02/06/2016    Significant Diagnostic Results in last 30 days:  Ct Angio Head W Or Wo Contrast  Result Date: 08/21/2017 CLINICAL DATA:  Status post carotid endarterectomy. Episode of vision change in both eyes. EXAM: CT ANGIOGRAPHY HEAD AND NECK TECHNIQUE: Multidetector CT imaging of the head and neck was performed using the standard protocol during bolus administration of intravenous contrast. Multiplanar CT image reconstructions and MIPs were obtained to evaluate the vascular anatomy. Carotid stenosis measurements (when applicable) are obtained utilizing NASCET criteria, using the distal internal carotid diameter as the denominator. CONTRAST:  41mL OMNIPAQUE IOHEXOL 350 MG/ML SOLN COMPARISON:  Head CT from yesterday. Brain MRI 02/06/2016. Neck CTA 02/06/2016 FINDINGS: CT HEAD FINDINGS Brain: No evidence of acute infarction, hemorrhage, hydrocephalus, extra-axial collection or mass lesion/mass effect. Mild patchy low-density in the cerebral white matter is stable and best attributed to chronic small vessel ischemia and dilated perivascular spaces based on 2017 brain MRI. Vascular: See below Skull: Negative. Sinuses: Stable small volume secretions in the sphenoid sinuses. Orbits: Negative Review of the MIP images confirms the above findings CTA NECK FINDINGS Aortic arch: Atherosclerotic calcification. 3 vessel branching pattern.  Right carotid system: Atherosclerotic plaque at the bifurcation with up to 35% proximal ICA narrowing. The ICA is tortuous. No beading or dissection is seen. Left carotid system: Fluid and to a lesser extent gas around the carotid sheath extending into the lateral neck, correlating with history of recent carotid endarterectomy. The vessel is smooth and diffusely patent. Vertebral arteries: No proximal subclavian stenosis despite atherosclerosis. Codominant vertebral arteries. There is moderate narrowing of the left vertebral artery at the origin, stenosis measured at 50%. No beading or dissection is seen. Skeleton: Negative. Other neck: Expected postoperative changes in the left neck. 9 mm left thyroid nodule below  size threshold for strict sonographic follow-up per consensus guidelines. Upper chest: Atelectasis and trace pleural fluid. Review of the MIP images confirms the above findings CTA HEAD FINDINGS Anterior circulation: Atherosclerotic plaque on the carotid siphons. The left ICA is smaller than the right in the setting of left A1 hypoplasia. No flow limiting stenosis or branch occlusion is seen. Of the negative for aneurysm. Posterior circulation: Extensive atherosclerotic plaque on the V4 segments with moderate to advanced narrowing bilaterally, worse on the left. These narrowings are over accentuated by small vessel size and calcified plaque blooming. Calcified plaque on the basilar without stenosis. No flow seen beyond the right P1 2 junction. There is some enhancing right V4 branches with nonvisualized collaterals. Venous sinuses: Patent Anatomic variants: As above Delayed phase: No abnormal parenchymal enhancement. These results were called by telephone at the time of interpretation on 08/21/2017 at 5:50 pm to Dr. Coral Else , who verbally acknowledged these results. Review of the MIP images confirms the above findings IMPRESSION: 1. Expected appearance of left carotid endarterectomy. No visible  embolic disease. 2. Intracranial atherosclerosis with proximal right PCA occlusion. No evidence of associated infarct. 3. 35% atheromatous narrowing at the right ICA bulb. 4. 50% narrowing of the left vertebral origin. Moderate to advanced bilateral V4 segment stenoses. 5. Atelectasis and trace pleural fluid. Electronically Signed   By: Marnee Spring M.D.   On: 08/21/2017 17:52   Ct Head Wo Contrast  Result Date: 08/20/2017 CLINICAL DATA:  Visual disturbance and lethargy since carotid endarterectomy yesterday. EXAM: CT HEAD WITHOUT CONTRAST TECHNIQUE: Contiguous axial images were obtained from the base of the skull through the vertex without intravenous contrast. COMPARISON:  02/06/2016 brain MRI FINDINGS: Brain: No evidence of acute infarction, hemorrhage, hydrocephalus, extra-axial collection or mass lesion/mass effect. Mild patchy low-density in the cerebral white matter attributed to chronic small vessel ischemia. Vascular: Extensive atherosclerotic calcification. No hyperdense vessel. Skull: No evidence of acute or aggressive process. Sinuses/Orbits: Mild generalized mucosal thickening with small sphenoid fluid levels. IMPRESSION: 1. No acute finding.  No visible infarct. 2. Mild chronic small vessel ischemia in the cerebral white matter. Electronically Signed   By: Marnee Spring M.D.   On: 08/20/2017 19:20   Ct Angio Neck W Or Wo Contrast  Result Date: 08/21/2017 CLINICAL DATA:  Status post carotid endarterectomy. Episode of vision change in both eyes. EXAM: CT ANGIOGRAPHY HEAD AND NECK TECHNIQUE: Multidetector CT imaging of the head and neck was performed using the standard protocol during bolus administration of intravenous contrast. Multiplanar CT image reconstructions and MIPs were obtained to evaluate the vascular anatomy. Carotid stenosis measurements (when applicable) are obtained utilizing NASCET criteria, using the distal internal carotid diameter as the denominator. CONTRAST:  75mL  OMNIPAQUE IOHEXOL 350 MG/ML SOLN COMPARISON:  Head CT from yesterday. Brain MRI 02/06/2016. Neck CTA 02/06/2016 FINDINGS: CT HEAD FINDINGS Brain: No evidence of acute infarction, hemorrhage, hydrocephalus, extra-axial collection or mass lesion/mass effect. Mild patchy low-density in the cerebral white matter is stable and best attributed to chronic small vessel ischemia and dilated perivascular spaces based on 2017 brain MRI. Vascular: See below Skull: Negative. Sinuses: Stable small volume secretions in the sphenoid sinuses. Orbits: Negative Review of the MIP images confirms the above findings CTA NECK FINDINGS Aortic arch: Atherosclerotic calcification. 3 vessel branching pattern. Right carotid system: Atherosclerotic plaque at the bifurcation with up to 35% proximal ICA narrowing. The ICA is tortuous. No beading or dissection is seen. Left carotid system: Fluid and to a lesser extent  gas around the carotid sheath extending into the lateral neck, correlating with history of recent carotid endarterectomy. The vessel is smooth and diffusely patent. Vertebral arteries: No proximal subclavian stenosis despite atherosclerosis. Codominant vertebral arteries. There is moderate narrowing of the left vertebral artery at the origin, stenosis measured at 50%. No beading or dissection is seen. Skeleton: Negative. Other neck: Expected postoperative changes in the left neck. 9 mm left thyroid nodule below size threshold for strict sonographic follow-up per consensus guidelines. Upper chest: Atelectasis and trace pleural fluid. Review of the MIP images confirms the above findings CTA HEAD FINDINGS Anterior circulation: Atherosclerotic plaque on the carotid siphons. The left ICA is smaller than the right in the setting of left A1 hypoplasia. No flow limiting stenosis or branch occlusion is seen. Of the negative for aneurysm. Posterior circulation: Extensive atherosclerotic plaque on the V4 segments with moderate to advanced  narrowing bilaterally, worse on the left. These narrowings are over accentuated by small vessel size and calcified plaque blooming. Calcified plaque on the basilar without stenosis. No flow seen beyond the right P1 2 junction. There is some enhancing right V4 branches with nonvisualized collaterals. Venous sinuses: Patent Anatomic variants: As above Delayed phase: No abnormal parenchymal enhancement. These results were called by telephone at the time of interpretation on 08/21/2017 at 5:50 pm to Dr. Coral Else , who verbally acknowledged these results. Review of the MIP images confirms the above findings IMPRESSION: 1. Expected appearance of left carotid endarterectomy. No visible embolic disease. 2. Intracranial atherosclerosis with proximal right PCA occlusion. No evidence of associated infarct. 3. 35% atheromatous narrowing at the right ICA bulb. 4. 50% narrowing of the left vertebral origin. Moderate to advanced bilateral V4 segment stenoses. 5. Atelectasis and trace pleural fluid. Electronically Signed   By: Marnee Spring M.D.   On: 08/21/2017 17:52   Dg Chest Port 1 View  Result Date: 08/20/2017 CLINICAL DATA:  Altered mental status, history hypertension, diabetes mellitus, coronary artery disease post CABG and MI EXAM: PORTABLE CHEST 1 VIEW COMPARISON:  Portable exam 1845 hours compared to 02/05/2016 FINDINGS: Enlargement of cardiac silhouette. Rotated to the RIGHT. Mediastinal contours normal. LEFT lower lobe atelectasis versus consolidation. Subsegmental atelectasis RIGHT base. Upper lungs clear. Atherosclerotic calcification aorta. No pleural effusion or pneumothorax. IMPRESSION: Mild enlargement of cardiac silhouette. RIGHT basilar atelectasis with additional atelectasis versus consolidation/pneumonia in LEFT lower lobe. Electronically Signed   By: Ulyses Southward M.D.   On: 08/20/2017 19:03    Assessment/Plan  Stenosis of left carotid artery  S/P carotid endarterectomy  Foot drop,  bilateral   Continue PT.OT  Continue exercises as taught by PT/OT  Continue OOB to chair daily  Pending delivery of AFO braces for Foot Drop  Follow up with Cardiologist as instructed  Family/ staff Communication:   Total Time:  Documentation:  Face to Face:  Family/Phone:   Labs/tests ordered:    Medication list reviewed and assessed for continued appropriateness. Monthly medication orders reviewed and signed.  Brynda Rim, NP-C Geriatrics [facility] [phone] [address], [place] [address] Cell Phone (Mon-Fri 8am-5pm):  [phone] On Call:  [phone] & follow prompts after 5pm & weekends Office Phone:  [phone] Office Fax:  [phone]

## 2017-11-27 ENCOUNTER — Emergency Department: Payer: Medicare Other

## 2017-11-27 ENCOUNTER — Encounter: Payer: Self-pay | Admitting: Emergency Medicine

## 2017-11-27 ENCOUNTER — Other Ambulatory Visit: Payer: Self-pay

## 2017-11-27 ENCOUNTER — Emergency Department
Admission: EM | Admit: 2017-11-27 | Discharge: 2017-11-27 | Disposition: A | Discharge status: Home / Self Care | Payer: Medicare Other | Attending: Emergency Medicine | Admitting: Emergency Medicine

## 2017-11-27 DIAGNOSIS — I252 Old myocardial infarction: Secondary | ICD-10-CM | POA: Diagnosis not present

## 2017-11-27 DIAGNOSIS — M25562 Pain in left knee: Secondary | ICD-10-CM | POA: Diagnosis present

## 2017-11-27 DIAGNOSIS — R2242 Localized swelling, mass and lump, left lower limb: Secondary | ICD-10-CM | POA: Diagnosis not present

## 2017-11-27 DIAGNOSIS — Z7902 Long term (current) use of antithrombotics/antiplatelets: Secondary | ICD-10-CM | POA: Diagnosis not present

## 2017-11-27 DIAGNOSIS — Z951 Presence of aortocoronary bypass graft: Secondary | ICD-10-CM | POA: Insufficient documentation

## 2017-11-27 DIAGNOSIS — Z7984 Long term (current) use of oral hypoglycemic drugs: Secondary | ICD-10-CM | POA: Insufficient documentation

## 2017-11-27 DIAGNOSIS — I10 Essential (primary) hypertension: Secondary | ICD-10-CM | POA: Diagnosis not present

## 2017-11-27 DIAGNOSIS — I2510 Atherosclerotic heart disease of native coronary artery without angina pectoris: Secondary | ICD-10-CM | POA: Diagnosis not present

## 2017-11-27 DIAGNOSIS — Z7982 Long term (current) use of aspirin: Secondary | ICD-10-CM | POA: Diagnosis not present

## 2017-11-27 DIAGNOSIS — E119 Type 2 diabetes mellitus without complications: Secondary | ICD-10-CM | POA: Diagnosis not present

## 2017-11-27 DIAGNOSIS — Z79899 Other long term (current) drug therapy: Secondary | ICD-10-CM | POA: Diagnosis not present

## 2017-11-27 MED ORDER — TRAMADOL HCL 50 MG PO TABS: 50.0000 mg | ORAL_TABLET | Freq: Two times a day (BID) | ORAL | 0 refills | Status: AC | PRN

## 2017-11-27 NOTE — ED Triage Notes (Signed)
Pt comes into the ED via POV c/o left knee swelling and pain.  Patient denies any injury to the knee that he can remember.  Mild swelling noted.  Patient in NAD at this time and was ambulatory intro triage.

## 2017-11-27 NOTE — ED Notes (Signed)

## 2017-11-27 NOTE — Discharge Instructions (Signed)
Xray of your left knee is negative for any acute bony abnormality I have given you a RX for Tramadol 50 mg every 12 hours prn Ice may be helpful You can purchase a Neoprene knee sleeve to help with swelling and pain Follow up with Dr. Ellsworth Lennox as needed

## 2017-11-27 NOTE — ED Provider Notes (Signed)
[facility] Emergency Department Provider Note ____________________________________________  Time seen: 1520  I have reviewed the triage vital signs and the nursing notes.  HISTORY  Chief Complaint  Leg Swelling   HPI Edward Thompson is a 67 y.o. male presents to the ER today with complaint of left knee pain and swelling.  He reports this started 2 days ago.  He describes the pain is sharp and stabbing.  The pain does not radiate but is worse with walking.  He denies pain, swelling or redness to the left calf.  He denies any injury to the area.  He has tried Tylenol with minimal relief.  He has a history of bilateral foot drop secondary to phenobarbital use.  He normally walks with a cane.  He has no history of gout.  Past Medical History:  Diagnosis Date  . CAD (coronary artery disease)   . Carotid stenosis, left 08/2017  . Diabetes mellitus without complication (HCC)   . Fracture of neck (HCC) 2008   fell off a roof. required halo x 4 months. also fractured alot of vertebrae  . Heart attack (HCC) 2015  . Heart disease   . Hepatitis    6th grade   . Hyperlipidemia   . Hypertension   . Myocardial infarction acute (HCC) 2015  . Seizures (HCC)    taking phenobarbitol and dilantin. LAST SEIZURE WAS 2016. well controlled on meds  . Sleep apnea    USES CPAP  . Syncope 2019  . Vertigo   . Viral meningitis     Patient Active Problem List   Diagnosis Date Noted  . S/P carotid endarterectomy 09/09/2017  . Foot drop, bilateral 09/09/2017  . Symptomatic carotid artery stenosis 08/19/2017  . Syncope 07/18/2017  . Carotid stenosis 07/18/2017  . Hyperlipidemia 07/18/2017  . Essential hypertension 07/18/2017  . Dizziness 02/05/2016    Past Surgical History:  Procedure Laterality Date  . CARDIAC SURGERY    . CORONARY ANGIOPLASTY     PATIENT UNAWARE OF THIS  . CORONARY ARTERY BYPASS GRAFT  2015  . ENDARTERECTOMY Left 08/19/2017   Procedure: ENDARTERECTOMY  CAROTID;  Surgeon: Renford Dills, MD;  Location: [facility] ORS;  Service: Vascular;  Laterality: Left;  . HERNIA REPAIR Right 1985   inguinal    Prior to Admission medications   Medication Sig Start Date End Date Taking? Authorizing Provider  aspirin EC 81 MG tablet Take 81 mg by mouth at bedtime.     [provider]  clopidogrel (PLAVIX) 75 MG tablet Take 75 mg by mouth at bedtime.  01/28/15   [provider]  isosorbide mononitrate (IMDUR) 30 MG 24 hr tablet Take 30 mg by mouth at bedtime.  04/29/17   [provider]  meclizine (ANTIVERT) 25 MG tablet Take 50 mg by mouth 2 (two) times daily.  05/27/14   [provider]  metFORMIN (GLUCOPHAGE) 500 MG tablet Take 500 mg by mouth daily.  04/28/17   [provider]  metoprolol succinate (TOPROL-XL) 50 MG 24 hr tablet Take 50 mg by mouth daily. Take with or immediately following a meal IN THE MORNING    [provider]  nitroGLYCERIN (NITROSTAT) 0.4 MG SL tablet Place 0.4 mg under the tongue every 5 (five) minutes as needed for chest pain.  08/22/13   [provider]  PHENobarbital (LUMINAL) 64.8 MG tablet Take 2 tablets (129.6 mg total) by mouth at bedtime. 08/25/17   Lorenso Quarry, NP  phenytoin (DILANTIN) 100 MG  ER capsule Take 300 mg by mouth at bedtime.     [provider]  rosuvastatin (CRESTOR) 40 MG tablet Take 40 mg by mouth at bedtime.     [provider]  traMADol (ULTRAM) 50 MG tablet Take 1 tablet (50 mg total) by mouth every 12 (twelve) hours as needed. 11/27/17 11/27/18  Lorre Munroe, NP    Allergies Patient has no known allergies.  Family History  Problem Relation Age of Onset  . Heart disease Mother   . Heart attack Father     Social History Social History   Tobacco Use  . Smoking status: Never Smoker  . Smokeless tobacco: Never Used  Substance Use Topics  . Alcohol use: No  . Drug use: No    Review of Systems  Constitutional:  Negative for fever. Musculoskeletal: Positive for knee pain.  Negative for back, hip or ankle pain. Skin: Positive for swelling. Negative for redness. Neurological: Positive for bilateral foot drop.  Negative for tingling or numbness. ____________________________________________  PHYSICAL EXAM:  VITAL SIGNS: ED Triage Vitals [11/27/17 1510]  Enc Vitals Group     BP (!) 155/94     Pulse Rate 82     Resp 17     Temp 98.9 F (37.2 C)     Temp Source Oral     SpO2 96 %     Weight 225 lb (102.1 kg)     Height 5\' 4"  (1.626 m)     Head Circumference      Peak Flow      Pain Score 8     Pain Loc      Pain Edu?      Excl. in GC?     Constitutional: Alert and oriented.  Obese, in no distress. Musculoskeletal: Trouble with active flexion of the left knee.  Normal extension.  Mild peripatellar joint swelling noted.  No pain with palpation of the left knee. Neurologic: Unable to assess gait, in wheelchair today. Skin:  Skin is warm, dry and intact.  Mild warmth noted of the left knee, but no redness. ____________________________________________   RADIOLOGY  Imaging Orders     DG Knee Complete 4 Views Left  IMPRESSION: No acute abnormality or arthropathy of the left knee. ____________________________________________  INITIAL IMPRESSION / ASSESSMENT AND PLAN / ED COURSE  Left Knee Pain:  DDX include left knee strain, arthritis, gout Xray of left knee negative for acute findings RX for Tramadol 50 mg BID prn Ice may be helpful for swelling and pain Can purchase a Neoprene knee sleeve to wear for comfort Follow up with Dr. Ellsworth Lennox as needed if symptoms persist or worsen  Controlled substance registry reveiwed for [place], [place], [place], [place], [place], [place]. No recent narcotics have been filled.  ____________________________________________  FINAL CLINICAL IMPRESSION(S) / ED DIAGNOSES  Final diagnoses:  Acute pain of left knee      Lorre Munroe, NP 11/27/17 1609    Dionne Bucy, MD 11/27/17 2322

## 2018-03-13 ENCOUNTER — Encounter (INDEPENDENT_AMBULATORY_CARE_PROVIDER_SITE_OTHER): Payer: Self-pay | Admitting: Vascular Surgery

## 2018-03-13 ENCOUNTER — Ambulatory Visit (INDEPENDENT_AMBULATORY_CARE_PROVIDER_SITE_OTHER): Payer: Medicare Other | Admitting: Vascular Surgery

## 2018-03-13 ENCOUNTER — Other Ambulatory Visit (INDEPENDENT_AMBULATORY_CARE_PROVIDER_SITE_OTHER): Payer: Self-pay | Admitting: Vascular Surgery

## 2018-03-13 ENCOUNTER — Ambulatory Visit (INDEPENDENT_AMBULATORY_CARE_PROVIDER_SITE_OTHER): Payer: Medicare Other

## 2018-03-13 VITALS — BP 138/82 | HR 66 | Resp 18 | Ht 67.0 in | Wt 220.0 lb

## 2018-03-13 DIAGNOSIS — I6522 Occlusion and stenosis of left carotid artery: Secondary | ICD-10-CM | POA: Diagnosis not present

## 2018-03-13 DIAGNOSIS — Z7982 Long term (current) use of aspirin: Secondary | ICD-10-CM

## 2018-03-13 DIAGNOSIS — Z9889 Other specified postprocedural states: Secondary | ICD-10-CM

## 2018-03-13 DIAGNOSIS — E782 Mixed hyperlipidemia: Secondary | ICD-10-CM

## 2018-03-13 DIAGNOSIS — I25118 Atherosclerotic heart disease of native coronary artery with other forms of angina pectoris: Secondary | ICD-10-CM | POA: Diagnosis not present

## 2018-03-13 DIAGNOSIS — I10 Essential (primary) hypertension: Secondary | ICD-10-CM | POA: Diagnosis not present

## 2018-03-14 ENCOUNTER — Encounter (INDEPENDENT_AMBULATORY_CARE_PROVIDER_SITE_OTHER): Payer: Self-pay | Admitting: Vascular Surgery

## 2018-03-14 DIAGNOSIS — I2510 Atherosclerotic heart disease of native coronary artery without angina pectoris: Secondary | ICD-10-CM | POA: Insufficient documentation

## 2018-03-14 NOTE — Progress Notes (Signed)
MRN : [id number]  Edward Thompson is a 67 y.o. (1951/02/15) male who presents with chief complaint of  Chief Complaint  Patient presents with  . Follow-up    6 month Carotid f/u  .  History of Present Illness:   The patient is seen for follow up evaluation of carotid stenosis. The carotid stenosis followed by ultrasound.   The patient denies amaurosis fugax. There is no recent history of TIA symptoms or focal motor deficits. There is no prior documented CVA.  The patient is taking enteric-coated aspirin 81 mg daily.  There is no history of migraine headaches. There is no history of seizures.  The patient has a history of coronary artery disease, no recent episodes of angina or shortness of breath. The patient denies PAD or claudication symptoms. There is a history of hyperlipidemia which is being treated with a statin.    Carotid Duplex done today shows 40-59% RICA and LICA stenosis.     Current Meds  Medication Sig  . aspirin EC 81 MG tablet Take 81 mg by mouth at bedtime.   . clopidogrel (PLAVIX) 75 MG tablet Take 75 mg by mouth at bedtime.   . isosorbide mononitrate (IMDUR) 30 MG 24 hr tablet Take 30 mg by mouth at bedtime.   Marland Kitchen losartan (COZAAR) 25 MG tablet Take 25 mg by mouth daily.  . meclizine (ANTIVERT) 25 MG tablet Take 50 mg by mouth 2 (two) times daily.   . metFORMIN (GLUCOPHAGE) 500 MG tablet Take 500 mg by mouth daily.   . metoprolol succinate (TOPROL-XL) 50 MG 24 hr tablet Take 50 mg by mouth daily. Take with or immediately following a meal IN THE MORNING  . nitroGLYCERIN (NITROSTAT) 0.4 MG SL tablet Place 0.4 mg under the tongue every 5 (five) minutes as needed for chest pain.   Marland Kitchen PHENobarbital (LUMINAL) 64.8 MG tablet Take 2 tablets (129.6 mg total) by mouth at bedtime.  . phenytoin (DILANTIN) 100 MG ER capsule Take 300 mg by mouth at bedtime.   . rosuvastatin (CRESTOR) 40 MG tablet Take 40 mg by mouth at bedtime.     Past Medical History:  Diagnosis Date   . CAD (coronary artery disease)   . Carotid stenosis, left 08/2017  . Diabetes mellitus without complication (HCC)   . Fracture of neck (HCC) 2008   fell off a roof. required halo x 4 months. also fractured alot of vertebrae  . Heart attack (HCC) 2015  . Heart disease   . Hepatitis    6th grade   . Hyperlipidemia   . Hypertension   . Myocardial infarction acute (HCC) 2015  . Seizures (HCC)    taking phenobarbitol and dilantin. LAST SEIZURE WAS 2016. well controlled on meds  . Sleep apnea    USES CPAP  . Syncope 2019  . Vertigo   . Viral meningitis     Past Surgical History:  Procedure Laterality Date  . CARDIAC SURGERY    . CORONARY ANGIOPLASTY     PATIENT UNAWARE OF THIS  . CORONARY ARTERY BYPASS GRAFT  2015  . ENDARTERECTOMY Left 08/19/2017   Procedure: ENDARTERECTOMY CAROTID;  Surgeon: Renford Dills, MD;  Location: [facility] ORS;  Service: Vascular;  Laterality: Left;  . HERNIA REPAIR Right 1985   inguinal    Social History Social History   Tobacco Use  . Smoking status: Never Smoker  . Smokeless tobacco: Never Used  Substance Use Topics  . Alcohol use: No  . Drug  use: No    Family History Family History  Problem Relation Age of Onset  . Heart disease Mother   . Heart attack Father     No Known Allergies   REVIEW OF SYSTEMS (Negative unless checked)  Constitutional: [] Weight loss  [] Fever  [] Chills Cardiac: [] Chest pain   [] Chest pressure   [] Palpitations   [] Shortness of breath when laying flat   [] Shortness of breath with exertion. Vascular:  [] Pain in legs with walking   [] Pain in legs at rest  [] History of DVT   [] Phlebitis   [] Swelling in legs   [] Varicose veins   [] Non-healing ulcers Pulmonary:   [] Uses home oxygen   [] Productive cough   [] Hemoptysis   [] Wheeze  [] COPD   [] Asthma Neurologic:  [] Dizziness   [] Seizures   [] History of stroke   [] History of TIA  [] Aphasia   [] Vissual changes   [] Weakness or numbness in arm   [] Weakness or numbness in  leg Musculoskeletal:   [] Joint swelling   [] Joint pain   [] Low back pain Hematologic:  [] Easy bruising  [] Easy bleeding   [] Hypercoagulable state   [] Anemic Gastrointestinal:  [] Diarrhea   [] Vomiting  [] Gastroesophageal reflux/heartburn   [] Difficulty swallowing. Genitourinary:  [] Chronic kidney disease   [] Difficult urination  [] Frequent urination   [] Blood in urine Skin:  [] Rashes   [] Ulcers  Psychological:  [] History of anxiety   []  History of major depression.  Physical Examination  Vitals:   03/13/18 1615 03/13/18 1616  BP: 131/77 138/82  Pulse: 69 66  Resp: 18   Weight: 220 lb (99.8 kg)   Height: 5\' 7"  (1.702 m)    Body mass index is 34.46 kg/m. Gen: WD/WN, NAD Head: [place]/AT, No temporalis wasting.  Ear/Nose/Throat: Hearing grossly intact, nares w/o erythema or drainage Eyes: PER, EOMI, sclera nonicteric.  Neck: Supple, no large masses.   Pulmonary:  Good air movement, no audible wheezing bilaterally, no use of accessory muscles.  Cardiac: RRR, no JVD Vascular:  Vessel Right Left  Radial Palpable Palpable  Brachial Palpable Palpable  Carotid Palpable Palpable  Gastrointestinal: Non-distended. No guarding/no peritoneal signs.  Musculoskeletal: M/S 5/5 throughout.  No deformity or atrophy.  Neurologic: CN 2-12 intact. Symmetrical.  Speech is fluent. Motor exam as listed above. Psychiatric: Judgment intact, Mood & affect appropriate for pt's clinical situation. Dermatologic: No rashes or ulcers noted.  No changes consistent with cellulitis. Lymph : No lichenification or skin changes of chronic lymphedema.  CBC Lab Results  Component Value Date   WBC 10.6 08/21/2017   HGB 12.7 (L) 08/21/2017   HCT 37.0 (L) 08/21/2017   MCV 92.0 08/21/2017   PLT 170 08/21/2017    BMET    Component Value Date/Time   NA 139 08/21/2017 1542   NA 139 09/22/2013 1832   K 3.8 08/21/2017 1542   K 3.8 09/22/2013 1832   CL 108 08/21/2017 1542   CL 106 09/22/2013 1832   CO2 25  08/21/2017 1542   CO2 25 09/22/2013 1832   GLUCOSE 187 (H) 08/21/2017 1542   GLUCOSE 162 (H) 09/22/2013 1832   BUN 16 08/21/2017 1542   BUN 16 09/22/2013 1832   CREATININE 0.74 08/21/2017 1542   CREATININE 0.91 09/22/2013 1832   CALCIUM 8.6 (L) 08/21/2017 1542   CALCIUM 8.5 09/22/2013 1832   GFRNONAA >60 08/21/2017 1542   GFRNONAA >60 09/22/2013 1832   GFRAA >60 08/21/2017 1542   GFRAA >60 09/22/2013 1832   CrCl cannot be calculated (Patient's most recent lab result  is older than the maximum 21 days allowed.).  COAG Lab Results  Component Value Date   INR 1.11 08/12/2017   INR 1.08 02/05/2016   INR 1.1 09/22/2013    Radiology Vas US Carotid  Result Date: 03/13/2018 Carotid Arterial Duplex Study Indications: Carotid artery disease. Performing Technologist: Debbe Bales RVS  Examination Guidelines: A complete evaluation includes B-mode imaging, spectral Doppler, color Doppler, and power Doppler as needed of all accessible portions of each vessel. Bilateral testing is considered an integral part of a complete examination. Limited examinations for reoccurring indications may be performed as noted.  Right Carotid Findings: +----------+--------+--------+--------+--------+--------+           PSV cm/sEDV cm/sStenosisDescribeComments +----------+--------+--------+--------+--------+--------+ CCA Prox  91      13                               +----------+--------+--------+--------+--------+--------+ CCA Mid   81      17                               +----------+--------+--------+--------+--------+--------+ CCA Distal95      17                               +----------+--------+--------+--------+--------+--------+ ICA Prox  141     43                               +----------+--------+--------+--------+--------+--------+ ICA Mid   107     25                               +----------+--------+--------+--------+--------+--------+ ICA Distal53                                        +----------+--------+--------+--------+--------+--------+ ECA       97      17                               +----------+--------+--------+--------+--------+--------+ +----------+--------+-------+--------+-------------------+           PSV cm/sEDV cmsDescribeArm Pressure (mmHG) +----------+--------+-------+--------+-------------------+ [id number]     0                                  +----------+--------+-------+--------+-------------------+ +---------+--------+--+--------+--+ VertebralPSV cm/s80EDV cm/s11 +---------+--------+--+--------+--+  Left Carotid Findings: +----------+--------+--------+--------+--------+--------+           PSV cm/sEDV cm/sStenosisDescribeComments +----------+--------+--------+--------+--------+--------+ CCA Prox  87      15                               +----------+--------+--------+--------+--------+--------+ CCA Mid   92      17                               +----------+--------+--------+--------+--------+--------+ CCA Distal102     18                               +----------+--------+--------+--------+--------+--------+  ICA Prox  78      14                               +----------+--------+--------+--------+--------+--------+ ICA Mid   112     30                               +----------+--------+--------+--------+--------+--------+ ICA Distal116     27                               +----------+--------+--------+--------+--------+--------+ ECA       207     29                               +----------+--------+--------+--------+--------+--------+ +----------+--------+--------+--------+-------------------+ SubclavianPSV cm/sEDV cm/sDescribeArm Pressure (mmHG) +----------+--------+--------+--------+-------------------+           187     0                                   +----------+--------+--------+--------+-------------------+  +---------+--------+--+--------+--+ VertebralPSV cm/s57EDV cm/s12 +---------+--------+--+--------+--+  Summary: Right Carotid: Velocities in the right ICA are consistent with a 40-59%                stenosis. Tortuous Right ICA. Left Carotid: Velocities in the left ICA are consistent with a 40-59% stenosis. Vertebrals:  Bilateral vertebral arteries demonstrate antegrade flow. Subclavians: Normal flow hemodynamics were seen in bilateral subclavian              arteries. *See table(s) above for measurements and observations.  Electronically signed by Levora Dredge MD on 03/13/2018 at 4:55:04 PM.    Final      Assessment/Plan 1. Stenosis of left carotid artery Recommend:  Given the patient's asymptomatic subcritical stenosis no further invasive testing or surgery at this time.  Duplex ultrasound shows 50% stenosis bilaterally.  Continue antiplatelet therapy as prescribed Continue management of CAD, HTN and Hyperlipidemia Healthy heart diet,  encouraged exercise at least 4 times per week Follow up in 12 months with duplex ultrasound and physical exam   2. Essential hypertension Continue antihypertensive medications as already ordered, these medications have been reviewed and there are no changes at this time.   3. Mixed hyperlipidemia Continue statin as ordered and reviewed, no changes at this time   4. Coronary artery disease of native artery of native heart with stable angina pectoris (HCC) Continue cardiac and antihypertensive medications as already ordered and reviewed, no changes at this time.  Continue statin as ordered and reviewed, no changes at this time  Nitrates PRN for chest pain     Levora Dredge, MD  03/14/2018 9:23 PM

## 2018-05-10 DIAGNOSIS — I2510 Atherosclerotic heart disease of native coronary artery without angina pectoris: Secondary | ICD-10-CM | POA: Insufficient documentation

## 2018-12-08 ENCOUNTER — Other Ambulatory Visit: Payer: Self-pay

## 2018-12-08 ENCOUNTER — Emergency Department: Payer: Medicare Other

## 2018-12-08 ENCOUNTER — Encounter: Payer: Self-pay | Admitting: Emergency Medicine

## 2018-12-08 ENCOUNTER — Emergency Department
Admission: EM | Admit: 2018-12-08 | Discharge: 2018-12-08 | Disposition: A | Discharge status: Home / Self Care | Payer: Medicare Other | Attending: Emergency Medicine | Admitting: Emergency Medicine

## 2018-12-08 DIAGNOSIS — Z79899 Other long term (current) drug therapy: Secondary | ICD-10-CM | POA: Insufficient documentation

## 2018-12-08 DIAGNOSIS — S42254A Nondisplaced fracture of greater tuberosity of right humerus, initial encounter for closed fracture: Secondary | ICD-10-CM | POA: Insufficient documentation

## 2018-12-08 DIAGNOSIS — Z20828 Contact with and (suspected) exposure to other viral communicable diseases: Secondary | ICD-10-CM | POA: Diagnosis not present

## 2018-12-08 DIAGNOSIS — Z7982 Long term (current) use of aspirin: Secondary | ICD-10-CM | POA: Diagnosis not present

## 2018-12-08 DIAGNOSIS — I2510 Atherosclerotic heart disease of native coronary artery without angina pectoris: Secondary | ICD-10-CM | POA: Insufficient documentation

## 2018-12-08 DIAGNOSIS — Y92009 Unspecified place in unspecified non-institutional (private) residence as the place of occurrence of the external cause: Secondary | ICD-10-CM | POA: Diagnosis not present

## 2018-12-08 DIAGNOSIS — W010XXA Fall on same level from slipping, tripping and stumbling without subsequent striking against object, initial encounter: Secondary | ICD-10-CM | POA: Diagnosis not present

## 2018-12-08 DIAGNOSIS — W19XXXA Unspecified fall, initial encounter: Secondary | ICD-10-CM

## 2018-12-08 DIAGNOSIS — I10 Essential (primary) hypertension: Secondary | ICD-10-CM | POA: Diagnosis not present

## 2018-12-08 DIAGNOSIS — S4291XA Fracture of right shoulder girdle, part unspecified, initial encounter for closed fracture: Secondary | ICD-10-CM

## 2018-12-08 DIAGNOSIS — Z951 Presence of aortocoronary bypass graft: Secondary | ICD-10-CM | POA: Diagnosis not present

## 2018-12-08 DIAGNOSIS — E119 Type 2 diabetes mellitus without complications: Secondary | ICD-10-CM | POA: Diagnosis not present

## 2018-12-08 DIAGNOSIS — R55 Syncope and collapse: Secondary | ICD-10-CM | POA: Diagnosis not present

## 2018-12-08 DIAGNOSIS — Y999 Unspecified external cause status: Secondary | ICD-10-CM | POA: Diagnosis not present

## 2018-12-08 DIAGNOSIS — I252 Old myocardial infarction: Secondary | ICD-10-CM | POA: Diagnosis not present

## 2018-12-08 DIAGNOSIS — S4991XA Unspecified injury of right shoulder and upper arm, initial encounter: Secondary | ICD-10-CM | POA: Diagnosis present

## 2018-12-08 DIAGNOSIS — Y939 Activity, unspecified: Secondary | ICD-10-CM | POA: Diagnosis not present

## 2018-12-08 LAB — CBC WITH DIFFERENTIAL/PLATELET
Abs Immature Granulocytes: 0.07 10*3/uL (ref 0.00–0.07)
Basophils Absolute: 0 10*3/uL (ref 0.0–0.1)
Basophils Relative: 0 %
Eosinophils Absolute: 0.5 10*3/uL (ref 0.0–0.5)
Eosinophils Relative: 3 %
HCT: 35.1 % — ABNORMAL LOW (ref 39.0–52.0)
Hemoglobin: 11.8 g/dL — ABNORMAL LOW (ref 13.0–17.0)
Immature Granulocytes: 1 %
Lymphocytes Relative: 9 %
Lymphs Abs: 1.2 10*3/uL (ref 0.7–4.0)
MCH: 30.3 pg (ref 26.0–34.0)
MCHC: 33.6 g/dL (ref 30.0–36.0)
MCV: 90.2 fL (ref 80.0–100.0)
Monocytes Absolute: 0.8 10*3/uL (ref 0.1–1.0)
Monocytes Relative: 6 %
Neutro Abs: 11.2 10*3/uL — ABNORMAL HIGH (ref 1.7–7.7)
Neutrophils Relative %: 81 %
Platelets: 204 10*3/uL (ref 150–400)
RBC: 3.89 MIL/uL — ABNORMAL LOW (ref 4.22–5.81)
RDW: 13.1 % (ref 11.5–15.5)
WBC: 13.8 10*3/uL — ABNORMAL HIGH (ref 4.0–10.5)
nRBC: 0 % (ref 0.0–0.2)

## 2018-12-08 LAB — COMPREHENSIVE METABOLIC PANEL
ALT: 23 U/L (ref 0–44)
AST: 20 U/L (ref 15–41)
Albumin: 4.5 g/dL (ref 3.5–5.0)
Alkaline Phosphatase: 73 U/L (ref 38–126)
Anion gap: 9 (ref 5–15)
BUN: 16 mg/dL (ref 8–23)
CO2: 25 mmol/L (ref 22–32)
Calcium: 8.8 mg/dL — ABNORMAL LOW (ref 8.9–10.3)
Chloride: 102 mmol/L (ref 98–111)
Creatinine, Ser: 0.66 mg/dL (ref 0.61–1.24)
GFR calc Af Amer: >60 mL/min (ref >60)
GFR calc non Af Amer: >60 mL/min (ref >60)
Glucose, Bld: 151 mg/dL — ABNORMAL HIGH (ref 70–99)
Potassium: 3.6 mmol/L (ref 3.5–5.1)
Sodium: 136 mmol/L (ref 135–145)
Total Bilirubin: 0.4 mg/dL (ref 0.3–1.2)
Total Protein: 7.4 g/dL (ref 6.5–8.1)

## 2018-12-08 LAB — SARS CORONAVIRUS 2 BY RT PCR (HOSPITAL ORDER, PERFORMED IN ~~LOC~~ HOSPITAL LAB): SARS Coronavirus 2: NEGATIVE

## 2018-12-08 MED ORDER — OXYCODONE-ACETAMINOPHEN 5-325 MG PO TABS: 1.0000 | ORAL_TABLET | ORAL | 0 refills | Status: AC | PRN

## 2018-12-08 MED ORDER — OXYCODONE-ACETAMINOPHEN 5-325 MG PO TABS
1.0000 | ORAL_TABLET | Freq: Once | ORAL | Status: AC
  Administered 2018-12-08: 14:00:00 1 via ORAL
  Filled 2018-12-08: qty 1

## 2018-12-08 NOTE — Discharge Instructions (Addendum)
Take the Percocet 1 pill 4 times a day as needed for pain.  Use ice to the area 20 minutes every hour or so.  Make sure you wrap the ice and towel or something similar to keep it off your skin.  Do not fall asleep with the ice on you as it can cause burns.  Wear the shoulder immobilizer. Sleep siting up at a 45 degree angle. This will help maintain the alignment of the bone pieces. Dr. Marry Guan the orthopedic surgeon should arrange follow-up for you. You can call his office if he does not speak to you by Monday at 11AM

## 2018-12-08 NOTE — ED Triage Notes (Signed)
Pt in via EMS from home with c/o falling and hurting right arm. EMS placed a #20 g to left AC.  BP 157/87, HR 80. Denies LOC.  Pt reports pain is to mid upper right arm and up to shoulder. Denies LOC. Arm splinted at the moment.

## 2018-12-08 NOTE — ED Notes (Signed)
Patient transported to X-ray and CT 

## 2018-12-08 NOTE — ED Notes (Signed)
[employer] regarding pt's diagnosis and discharge plan, we be here in a few minutes to get him

## 2018-12-08 NOTE — ED Provider Notes (Signed)
[facility] Emergency Department Provider Note   ____________________________________________   First MD Initiated Contact with Patient 12/08/18 1006     (approximate)  I have reviewed the triage vital signs and the nursing notes.   HISTORY  Chief Complaint Fall, Arm Injury, and Shoulder Pain   HPI BRENEN BEIGEL is a 68 y.o. male who reports he slipped and fell landing on his arm he hurts in his shoulder.  He did not hit his head did not pass out.  Patient says the shoulder pain is quite bad.  Patient denies any numbness or weakness in his hand.  Patient lives at home with his son and daughter-in-law and their children.  Patient thinks he can get along okay at home.         Past Medical History:  Diagnosis Date  . CAD (coronary artery disease)   . Carotid stenosis, left 08/2017  . Diabetes mellitus without complication ([place])   . Fracture of neck ([place]) 2008   fell off a roof. required halo x 4 months. also fractured alot of vertebrae  . Heart attack ([place]) 2015  . Heart disease   . Hepatitis    6th grade   . Hyperlipidemia   . Hypertension   . Myocardial infarction acute ([place]) 2015  . Seizures ([place])    taking phenobarbitol and dilantin. LAST SEIZURE WAS 2016. well controlled on meds  . Sleep apnea    USES CPAP  . Syncope 2019  . Vertigo   . Viral meningitis     Patient Active Problem List   Diagnosis Date Noted  . CAD (coronary artery disease) 03/14/2018  . S/P carotid endarterectomy 09/09/2017  . Foot drop, bilateral 09/09/2017  . Symptomatic carotid artery stenosis 08/19/2017  . Syncope 07/18/2017  . Carotid stenosis 07/18/2017  . Hyperlipidemia 07/18/2017  . Essential hypertension 07/18/2017  . Dizziness 02/05/2016    Past Surgical History:  Procedure Laterality Date  . CARDIAC SURGERY    . CORONARY ANGIOPLASTY     PATIENT UNAWARE OF THIS  . CORONARY ARTERY BYPASS GRAFT  2015  . ENDARTERECTOMY Left 08/19/2017   Procedure:  ENDARTERECTOMY CAROTID;  Surgeon: Katha Cabal, MD;  Location: [facility] ORS;  Service: Vascular;  Laterality: Left;  . HERNIA REPAIR Right 1985   inguinal    Prior to Admission medications   Medication Sig Start Date End Date Taking? Authorizing Provider  aspirin EC 81 MG tablet Take 81 mg by mouth at bedtime.     [provider]  clopidogrel (PLAVIX) 75 MG tablet Take 75 mg by mouth at bedtime.  01/28/15   [provider]  isosorbide mononitrate (IMDUR) 30 MG 24 hr tablet Take 30 mg by mouth at bedtime.  04/29/17   [provider]  losartan (COZAAR) 25 MG tablet Take 25 mg by mouth daily. 12/29/17   [provider]  meclizine (ANTIVERT) 25 MG tablet Take 50 mg by mouth 2 (two) times daily.  05/27/14   [provider]  metFORMIN (GLUCOPHAGE) 500 MG tablet Take 500 mg by mouth daily.  04/28/17   [provider]  metoprolol succinate (TOPROL-XL) 50 MG 24 hr tablet Take 50 mg by mouth daily. Take with or immediately following a meal IN THE MORNING    [provider]  nitroGLYCERIN (NITROSTAT) 0.4 MG SL tablet Place 0.4 mg under the tongue every 5 (five) minutes as needed for chest pain.  08/22/13   [provider]  PHENobarbital (LUMINAL) 64.8  MG tablet Take 2 tablets (129.6 mg total) by mouth at bedtime. 08/25/17   Lorenso QuarryLeach, Shannon, NP  phenytoin (DILANTIN) 100 MG ER capsule Take 300 mg by mouth at bedtime.     [provider]  rosuvastatin (CRESTOR) 40 MG tablet Take 40 mg by mouth at bedtime.     [provider]    Allergies Patient has no known allergies.  Family History  Problem Relation Age of Onset  . Heart disease Mother   . Heart attack Father     Social History Social History   Tobacco Use  . Smoking status: Never Smoker  . Smokeless tobacco: Never Used  Substance Use Topics  . Alcohol use: No  . Drug use: No    Review of Systems  Constitutional: No fever/chills Eyes: No visual  changes. ENT: No sore throat. Cardiovascular: Denies chest pain. Respiratory: Denies shortness of breath. Gastrointestinal: No abdominal pain.  No nausea, no vomiting.  No diarrhea.  No constipation. Genitourinary: Negative for dysuria. Musculoskeletal: Negative for back pain. Skin: Negative for rash. Neurological: Negative for headaches, focal weakness   ____________________________________________   PHYSICAL EXAM:  VITAL SIGNS: ED Triage Vitals  Enc Vitals Group     BP 12/08/18 0954 (!) 157/85     Pulse Rate 12/08/18 0954 81     Resp 12/08/18 0954 18     Temp 12/08/18 0954 98.2 F (36.8 C)     Temp Source 12/08/18 0954 Oral     SpO2 12/08/18 0954 99 %     Weight 12/08/18 0920 200 lb (90.7 kg)     Height 12/08/18 0920 5\' 4"  (1.626 m)     Head Circumference --      Peak Flow --      Pain Score 12/08/18 0920 10     Pain Loc --      Pain Edu? --      Excl. in GC? --     Constitutional: Alert and oriented.  In pain Eyes: Conjunctivae are normal.  Head: Atraumatic. Nose: No congestion/rhinnorhea. Mouth/Throat: Mucous membranes are moist.  Oropharynx non-erythematous. Neck: No stridor.   Cardiovascular: Normal rate, regular rhythm. Grossly normal heart sounds.  Good peripheral circulation. Respiratory: Normal respiratory effort.  No retractions. Lungs CTAB. Gastrointestinal: Soft and nontender. No distention. No abdominal bruits. No CVA tenderness. Musculoskeletal: No lower extremity tenderness lateral 2+ edema.  Right upper arm is very tender there is no tenderness in the lower arm no neurological defects in the hand or wrist Neurologic:  Normal speech and language. No gross focal neurologic deficits are appreciated.  Skin:  Skin is warm, dry and intact. No rash noted.  ____________________________________________   LABS (all labs ordered are listed, but only abnormal results are displayed)  Labs Reviewed  COMPREHENSIVE METABOLIC PANEL - Abnormal; Notable for the  following components:      Result Value   Glucose, Bld 151 (*)    Calcium 8.8 (*)    All other components within normal limits  CBC WITH DIFFERENTIAL/PLATELET - Abnormal; Notable for the following components:   WBC 13.8 (*)    RBC 3.89 (*)    Hemoglobin 11.8 (*)    HCT 35.1 (*)    Neutro Abs 11.2 (*)    All other components within normal limits  SARS CORONAVIRUS 2 (HOSPITAL ORDER, PERFORMED IN [facility])   ____________________________________________  EKG   ____________________________________________  RADIOLOGY  ED MD interpretation: Shoulder x-ray shows a comminuted fracture upper humerus CT is  same I reviewed all the films  Official radiology report(s): Dg Shoulder Right  Result Date: 12/08/2018 CLINICAL DATA:  Acute RIGHT shoulder pain following fall. EXAM: RIGHT SHOULDER - 2+ VIEW COMPARISON:  None. FINDINGS: A mildly comminuted humeral neck fracture is identified extending into the head/greater tuberosity with 8 mm MEDIAL displacement. Equivocal irregularity/fracture of the bony glenoid noted. No humeral head dislocation. Diffuse osteopenia is identified. IMPRESSION: Mildly comminuted humeral neck fracture extending into the head/greater tuberosity. Equivocal glenoid fracture. Electronically Signed   By: Harmon PierJeffrey  Hu M.D.   On: 12/08/2018 10:17   Ct Head Wo Contrast  Result Date: 12/08/2018 CLINICAL DATA:  Status post fall today resulting in a proximal right humerus fracture. Initial encounter. EXAM: CT HEAD WITHOUT CONTRAST CT CERVICAL SPINE WITHOUT CONTRAST TECHNIQUE: Multidetector CT imaging of the head and cervical spine was performed following the standard protocol without intravenous contrast. Multiplanar CT image reconstructions of the cervical spine were also generated. COMPARISON:  Head CT scan 08/21/2017. FINDINGS: CT HEAD FINDINGS Brain: No evidence of acute infarction, hemorrhage, hydrocephalus, extra-axial collection or mass lesion/mass effect. Mild  atrophy and chronic microvascular ischemic change noted. Vascular: No hyperdense vessel or unexpected calcification. Skull: Intact.  No focal lesion. Sinuses/Orbits: Scattered ethmoid air cell disease noted. Minimal mucosal thickening left frontal sinus. Other: None. CT CERVICAL SPINE FINDINGS Alignment: Normal. Skull base and vertebrae: No acute fracture. No primary bone lesion or focal pathologic process. Soft tissues and spinal canal: No prevertebral fluid or swelling. No visible canal hematoma. Disc levels:  Intervertebral disc space height is maintained. Upper chest: Lung apices are clear. Other: None. IMPRESSION: No acute abnormality head or cervical spine. Mild atrophy and chronic microvascular ischemic change. Scattered ethmoid air cell disease. Electronically Signed   By: Drusilla Kannerhomas  Dalessio M.D.   On: 12/08/2018 10:40   Ct Cervical Spine Wo Contrast  Result Date: 12/08/2018 CLINICAL DATA:  Status post fall today resulting in a proximal right humerus fracture. Initial encounter. EXAM: CT HEAD WITHOUT CONTRAST CT CERVICAL SPINE WITHOUT CONTRAST TECHNIQUE: Multidetector CT imaging of the head and cervical spine was performed following the standard protocol without intravenous contrast. Multiplanar CT image reconstructions of the cervical spine were also generated. COMPARISON:  Head CT scan 08/21/2017. FINDINGS: CT HEAD FINDINGS Brain: No evidence of acute infarction, hemorrhage, hydrocephalus, extra-axial collection or mass lesion/mass effect. Mild atrophy and chronic microvascular ischemic change noted. Vascular: No hyperdense vessel or unexpected calcification. Skull: Intact.  No focal lesion. Sinuses/Orbits: Scattered ethmoid air cell disease noted. Minimal mucosal thickening left frontal sinus. Other: None. CT CERVICAL SPINE FINDINGS Alignment: Normal. Skull base and vertebrae: No acute fracture. No primary bone lesion or focal pathologic process. Soft tissues and spinal canal: No prevertebral fluid or  swelling. No visible canal hematoma. Disc levels:  Intervertebral disc space height is maintained. Upper chest: Lung apices are clear. Other: None. IMPRESSION: No acute abnormality head or cervical spine. Mild atrophy and chronic microvascular ischemic change. Scattered ethmoid air cell disease. Electronically Signed   By: Drusilla Kannerhomas  Dalessio M.D.   On: 12/08/2018 10:40   Ct Shoulder Right Wo Contrast  Result Date: 12/08/2018 CLINICAL DATA:  Fall.  Proximal right humeral fracture. EXAM: CT OF THE UPPER RIGHT EXTREMITY WITHOUT CONTRAST TECHNIQUE: Multidetector CT imaging of the right shoulder was performed according to the standard protocol. COMPARISON:  Radiographs 12/08/2018. FINDINGS: Bones/Joint/Cartilage The bones are demineralized. There is a severely comminuted and moderately displaced fracture involving the surgical and anatomic necks of right proximal humerus. The fracture extends  superiorly to involve the greater tuberosity. There is no significant involvement of the humeral head articular surface, although there is a minimally displaced fracture of the inferior glenoid, most obvious on the coronal images. The right clavicle and scapula otherwise appear intact. The humeral head is located. There is only a small shoulder joint effusion. Ligaments Suboptimally assessed by CT. Muscles and Tendons No focal rotator cuff muscular atrophy identified. There is no significant muscular hematoma. Grossly intact biceps tendon. Soft tissues Mild soft tissue swelling surrounding the shoulder and extending into the axillary recess. No large hematoma identified. IMPRESSION: 1. Significantly comminuted and displaced fracture of the proximal right humerus as described. 2. Minimally displaced fracture of the inferior glenoid. No dislocation. 3. No focal muscular abnormality or significant rotator cuff muscular atrophy identified. Electronically Signed   By: Carey Bullocks M.D.   On: 12/08/2018 12:31   Dg Chest Portable 1  View  Result Date: 12/08/2018 CLINICAL DATA:  Fall with arm pain EXAM: PORTABLE CHEST 1 VIEW COMPARISON:  08/20/2017 FINDINGS: Normal heart size and mediastinal contours. No acute infiltrate or edema. No effusion or pneumothorax. Known proximal right humerus fracture.  Remote left rib fractures. IMPRESSION: 1. No evidence of acute cardiopulmonary disease. 2. Right proximal humerus fracture. Electronically Signed   By: Marnee Spring M.D.   On: 12/08/2018 10:37    ____________________________________________   PROCEDURES  Procedure(s) performed (including Critical Care):  Procedures   ____________________________________________   INITIAL IMPRESSION / ASSESSMENT AND PLAN / ED COURSE  Discussed patient with Dr. Ernest Pine who wanted the CT scan.  He feels the patient is not eligible for admission at this point.  Patient will need shoulder surgery however.  He will schedule that.              ____________________________________________   FINAL CLINICAL IMPRESSION(S) / ED DIAGNOSES  Final diagnoses:  Fall, initial encounter  Shoulder fracture, right, closed, initial encounter     ED Discharge Orders    None       Note:  This document was prepared using Dragon voice recognition software and may include unintentional dictation errors.    Arnaldo Natal, MD 12/08/18 1400

## 2018-12-08 NOTE — ED Notes (Signed)
Patient transported to CT 

## 2019-03-15 ENCOUNTER — Encounter (INDEPENDENT_AMBULATORY_CARE_PROVIDER_SITE_OTHER): Payer: Medicare Other

## 2019-03-15 ENCOUNTER — Ambulatory Visit (INDEPENDENT_AMBULATORY_CARE_PROVIDER_SITE_OTHER): Payer: Medicare Other | Admitting: Vascular Surgery

## 2019-04-17 NOTE — Progress Notes (Signed)
MRN : [id number]  Edward Thompson is a 68 y.o. (01/03/1951) male who presents with chief complaint of No chief complaint on file. Marland Kitchen  History of Present Illness:   The patient is seen for follow up evaluation of carotid stenosis. The carotid stenosis followed by ultrasound.   He is s/p  Left carotid endarterectomy with CorMatrix arterial patch reconstruction on 08/19/2017.  The patient denies amaurosis fugax. There is no recent history of TIA symptoms or focal motor deficits. There is no prior documented CVA.  The patient is taking enteric-coated aspirin 81 mg daily.  There is no history of migraine headaches. There is no history of seizures.  The patient has a history of coronary artery disease, no recent episodes of angina or shortness of breath. The patient denies PAD or claudication symptoms. There is a history of hyperlipidemia which is being treated with a statin.   Carotid Duplex done today shows 40-59% RICA and LICA stenosis.     No outpatient medications have been marked as taking for the 04/19/19 encounter (Appointment) with Gilda Crease, Latina Craver, MD.    Past Medical History:  Diagnosis Date  . CAD (coronary artery disease)   . Carotid stenosis, left 08/2017  . Diabetes mellitus without complication ([facility])   . Fracture of neck ([facility]) 2008   fell off a roof. required halo x 4 months. also fractured alot of vertebrae  . Heart attack ([facility]) 2015  . Heart disease   . Hepatitis    6th grade   . Hyperlipidemia   . Hypertension   . Myocardial infarction acute ([facility]) 2015  . Seizures ([facility])    taking phenobarbitol and dilantin. LAST SEIZURE WAS 2016. well controlled on meds  . Sleep apnea    USES CPAP  . Syncope 2019  . Vertigo   . Viral meningitis     Past Surgical History:  Procedure Laterality Date  . CARDIAC SURGERY    . CORONARY ANGIOPLASTY     PATIENT UNAWARE OF THIS  . CORONARY ARTERY BYPASS GRAFT  2015  . ENDARTERECTOMY Left 08/19/2017   Procedure:  ENDARTERECTOMY CAROTID;  Surgeon: Renford Dills, MD;  Location: [facility] ORS;  Service: Vascular;  Laterality: Left;  . HERNIA REPAIR Right 1985   inguinal    Social History Social History   Tobacco Use  . Smoking status: Never Smoker  . Smokeless tobacco: Never Used  Substance Use Topics  . Alcohol use: No  . Drug use: No    Family History Family History  Problem Relation Age of Onset  . Heart disease Mother   . Heart attack Father     No Known Allergies   REVIEW OF SYSTEMS (Negative unless checked)  Constitutional: [] Weight loss  [] Fever  [] Chills Cardiac: [] Chest pain   [] Chest pressure   [] Palpitations   [] Shortness of breath when laying flat   [] Shortness of breath with exertion. Vascular:  [] Pain in legs with walking   [] Pain in legs at rest  [] History of DVT   [] Phlebitis   [] Swelling in legs   [] Varicose veins   [] Non-healing ulcers Pulmonary:   [] Uses home oxygen   [] Productive cough   [] Hemoptysis   [] Wheeze  [] COPD   [] Asthma Neurologic:  [] Dizziness   [] Seizures   [] History of stroke   [] History of TIA  [] Aphasia   [] Vissual changes   [] Weakness or numbness in arm   [] Weakness or numbness in leg Musculoskeletal:   [] Joint swelling   [] Joint pain   []   Low back pain Hematologic:  [] Easy bruising  [] Easy bleeding   [] Hypercoagulable state   [] Anemic Gastrointestinal:  [] Diarrhea   [] Vomiting  [] Gastroesophageal reflux/heartburn   [] Difficulty swallowing. Genitourinary:  [] Chronic kidney disease   [] Difficult urination  [] Frequent urination   [] Blood in urine Skin:  [] Rashes   [] Ulcers  Psychological:  [] History of anxiety   []  History of major depression.  Physical Examination  There were no vitals filed for this visit. There is no height or weight on file to calculate BMI. Gen: WD/WN, NAD Head: [place]/AT, No temporalis wasting.  Ear/Nose/Throat: Hearing grossly intact, nares w/o erythema or drainage Eyes: PER, EOMI, sclera nonicteric.  Neck: Supple, no large  masses.   Pulmonary:  Good air movement, no audible wheezing bilaterally, no use of accessory muscles.  Cardiac: RRR, no JVD Vascular:  Left cea incisional scar Vessel Right Left  Radial Palpable Palpable  Brachial Palpable Palpable  Carotid Palpable Palpable  Gastrointestinal: Non-distended. No guarding/no peritoneal signs.  Musculoskeletal: M/S 5/5 throughout.  No deformity or atrophy.  Neurologic: CN 2-12 intact. Symmetrical.  Speech is fluent. Motor exam as listed above. Psychiatric: Judgment intact, Mood & affect appropriate for pt's clinical situation. Dermatologic: No rashes or ulcers noted.  No changes consistent with cellulitis. Lymph : No lichenification or skin changes of chronic lymphedema.  CBC Lab Results  Component Value Date   WBC 13.8 (H) 12/08/2018   HGB 11.8 (L) 12/08/2018   HCT 35.1 (L) 12/08/2018   MCV 90.2 12/08/2018   PLT 204 12/08/2018    BMET    Component Value Date/Time   NA 136 12/08/2018 1057   NA 139 09/22/2013 1832   K 3.6 12/08/2018 1057   K 3.8 09/22/2013 1832   CL 102 12/08/2018 1057   CL 106 09/22/2013 1832   CO2 25 12/08/2018 1057   CO2 25 09/22/2013 1832   GLUCOSE 151 (H) 12/08/2018 1057   GLUCOSE 162 (H) 09/22/2013 1832   BUN 16 12/08/2018 1057   BUN 16 09/22/2013 1832   CREATININE 0.66 12/08/2018 1057   CREATININE 0.91 09/22/2013 1832   CALCIUM 8.8 (L) 12/08/2018 1057   CALCIUM 8.5 09/22/2013 1832   GFRNONAA >60 12/08/2018 1057   GFRNONAA >60 09/22/2013 1832   GFRAA >60 12/08/2018 1057   GFRAA >60 09/22/2013 1832   CrCl cannot be calculated (Patient's most recent lab result is older than the maximum 21 days allowed.).  COAG Lab Results  Component Value Date   INR 1.11 08/12/2017   INR 1.08 02/05/2016   INR 1.1 09/22/2013    Radiology No results found.   Assessment/Plan 1. Bilateral carotid artery stenosis Recommend:  Given the patient's asymptomatic subcritical stenosis no further invasive testing or surgery at  this time.  Duplex ultrasound shows 50% stenosis bilaterally.  Continue antiplatelet therapy as prescribed Continue management of CAD, HTN and Hyperlipidemia Healthy heart diet,  encouraged exercise at least 4 times per week Follow up in 12 months with duplex ultrasound and physical exam   - Carotid; Future  2. Coronary artery disease of native artery of native heart with stable angina pectoris (HCC) Continue cardiac and antihypertensive medications as already ordered and reviewed, no changes at this time.  Continue statin as ordered and reviewed, no changes at this time  Nitrates PRN for chest pain   3. Essential hypertension Continue antihypertensive medications as already ordered, these medications have been reviewed and there are no changes at this time.   4. Mixed hyperlipidemia Continue statin as ordered  and reviewed, no changes at this time   Levora Dredge, MD  04/17/2019 12:44 PM

## 2019-04-19 ENCOUNTER — Other Ambulatory Visit: Payer: Self-pay

## 2019-04-19 ENCOUNTER — Encounter (INDEPENDENT_AMBULATORY_CARE_PROVIDER_SITE_OTHER): Payer: Self-pay | Admitting: Vascular Surgery

## 2019-04-19 ENCOUNTER — Ambulatory Visit (INDEPENDENT_AMBULATORY_CARE_PROVIDER_SITE_OTHER): Payer: Medicare Other | Admitting: Vascular Surgery

## 2019-04-19 ENCOUNTER — Ambulatory Visit (INDEPENDENT_AMBULATORY_CARE_PROVIDER_SITE_OTHER): Payer: Medicare Other

## 2019-04-19 VITALS — BP 101/66 | HR 85 | Resp 14 | Ht 64.0 in | Wt 194.0 lb

## 2019-04-19 DIAGNOSIS — S12100A Unspecified displaced fracture of second cervical vertebra, initial encounter for closed fracture: Secondary | ICD-10-CM | POA: Insufficient documentation

## 2019-04-19 DIAGNOSIS — I10 Essential (primary) hypertension: Secondary | ICD-10-CM | POA: Diagnosis not present

## 2019-04-19 DIAGNOSIS — I25118 Atherosclerotic heart disease of native coronary artery with other forms of angina pectoris: Secondary | ICD-10-CM

## 2019-04-19 DIAGNOSIS — I6522 Occlusion and stenosis of left carotid artery: Secondary | ICD-10-CM | POA: Diagnosis not present

## 2019-04-19 DIAGNOSIS — E782 Mixed hyperlipidemia: Secondary | ICD-10-CM

## 2019-04-19 DIAGNOSIS — I6523 Occlusion and stenosis of bilateral carotid arteries: Secondary | ICD-10-CM | POA: Diagnosis not present

## 2019-05-09 ENCOUNTER — Encounter (INDEPENDENT_AMBULATORY_CARE_PROVIDER_SITE_OTHER): Payer: Self-pay | Admitting: Vascular Surgery

## 2019-06-05 DIAGNOSIS — I872 Venous insufficiency (chronic) (peripheral): Secondary | ICD-10-CM | POA: Insufficient documentation

## 2019-06-05 DIAGNOSIS — R4181 Age-related cognitive decline: Secondary | ICD-10-CM | POA: Insufficient documentation

## 2019-06-10 DIAGNOSIS — Z993 Dependence on wheelchair: Secondary | ICD-10-CM | POA: Insufficient documentation

## 2020-01-03 DIAGNOSIS — Z20822 Contact with and (suspected) exposure to covid-19: Secondary | ICD-10-CM | POA: Insufficient documentation

## 2020-01-03 DIAGNOSIS — Z7984 Long term (current) use of oral hypoglycemic drugs: Secondary | ICD-10-CM | POA: Insufficient documentation

## 2020-01-03 DIAGNOSIS — Z79899 Other long term (current) drug therapy: Secondary | ICD-10-CM | POA: Insufficient documentation

## 2020-01-03 DIAGNOSIS — Z7982 Long term (current) use of aspirin: Secondary | ICD-10-CM | POA: Diagnosis not present

## 2020-01-03 DIAGNOSIS — I10 Essential (primary) hypertension: Secondary | ICD-10-CM | POA: Insufficient documentation

## 2020-01-03 DIAGNOSIS — T7611XA Adult physical abuse, suspected, initial encounter: Secondary | ICD-10-CM | POA: Insufficient documentation

## 2020-01-03 DIAGNOSIS — F0391 Unspecified dementia with behavioral disturbance: Secondary | ICD-10-CM | POA: Diagnosis not present

## 2020-01-03 DIAGNOSIS — F05 Delirium due to known physiological condition: Secondary | ICD-10-CM | POA: Insufficient documentation

## 2020-01-03 DIAGNOSIS — M6281 Muscle weakness (generalized): Secondary | ICD-10-CM | POA: Insufficient documentation

## 2020-01-03 DIAGNOSIS — I2510 Atherosclerotic heart disease of native coronary artery without angina pectoris: Secondary | ICD-10-CM | POA: Diagnosis not present

## 2020-01-03 DIAGNOSIS — E119 Type 2 diabetes mellitus without complications: Secondary | ICD-10-CM | POA: Diagnosis not present

## 2020-01-03 DIAGNOSIS — R262 Difficulty in walking, not elsewhere classified: Secondary | ICD-10-CM | POA: Insufficient documentation

## 2020-01-03 DIAGNOSIS — F4325 Adjustment disorder with mixed disturbance of emotions and conduct: Secondary | ICD-10-CM | POA: Diagnosis not present

## 2020-01-03 DIAGNOSIS — R4189 Other symptoms and signs involving cognitive functions and awareness: Secondary | ICD-10-CM | POA: Diagnosis not present

## 2020-01-03 DIAGNOSIS — R2689 Other abnormalities of gait and mobility: Secondary | ICD-10-CM | POA: Diagnosis not present

## 2020-01-03 DIAGNOSIS — R4689 Other symptoms and signs involving appearance and behavior: Secondary | ICD-10-CM | POA: Diagnosis not present

## 2020-01-03 NOTE — ED Triage Notes (Signed)
PT to ED in [employer] custody. PT was attempting to walk to [place] to visit his cousins. PT did not pack a bag and states walking was the only way he could get there. HX of dementia, pt lives by himself and daughter is concerned, she is the one who took out IVC paperwork.

## 2020-01-04 ENCOUNTER — Other Ambulatory Visit: Payer: Self-pay

## 2020-01-04 ENCOUNTER — Emergency Department
Admission: EM | Admit: 2020-01-04 | Discharge: 2020-01-09 | Disposition: A | Discharge status: Home / Self Care | Payer: Medicare Other | Attending: Emergency Medicine | Admitting: Emergency Medicine

## 2020-01-04 DIAGNOSIS — F4329 Adjustment disorder with other symptoms: Secondary | ICD-10-CM | POA: Diagnosis present

## 2020-01-04 DIAGNOSIS — R4689 Other symptoms and signs involving appearance and behavior: Secondary | ICD-10-CM | POA: Diagnosis present

## 2020-01-04 DIAGNOSIS — T7491XS Unspecified adult maltreatment, confirmed, sequela: Secondary | ICD-10-CM

## 2020-01-04 DIAGNOSIS — F05 Delirium due to known physiological condition: Secondary | ICD-10-CM | POA: Diagnosis present

## 2020-01-04 DIAGNOSIS — F4325 Adjustment disorder with mixed disturbance of emotions and conduct: Secondary | ICD-10-CM

## 2020-01-04 LAB — URINE DRUG SCREEN, QUALITATIVE (ARMC ONLY)
Amphetamines, Ur Screen: NOT DETECTED
Barbiturates, Ur Screen: POSITIVE — AB
Benzodiazepine, Ur Scrn: NOT DETECTED
Cannabinoid 50 Ng, Ur ~~LOC~~: NOT DETECTED
Cocaine Metabolite,Ur ~~LOC~~: NOT DETECTED
MDMA (Ecstasy)Ur Screen: NOT DETECTED
Methadone Scn, Ur: NOT DETECTED
Opiate, Ur Screen: NOT DETECTED
Phencyclidine (PCP) Ur S: NOT DETECTED
Tricyclic, Ur Screen: NOT DETECTED

## 2020-01-04 LAB — COMPREHENSIVE METABOLIC PANEL
ALT: 24 U/L (ref 0–44)
AST: 24 U/L (ref 15–41)
Albumin: 4.5 g/dL (ref 3.5–5.0)
Alkaline Phosphatase: 87 U/L (ref 38–126)
Anion gap: 13 (ref 5–15)
BUN: 15 mg/dL (ref 8–23)
CO2: 23 mmol/L (ref 22–32)
Calcium: 9.3 mg/dL (ref 8.9–10.3)
Chloride: 97 mmol/L — ABNORMAL LOW (ref 98–111)
Creatinine, Ser: 0.46 mg/dL — ABNORMAL LOW (ref 0.61–1.24)
GFR calc Af Amer: >60 mL/min (ref >60)
GFR calc non Af Amer: >60 mL/min (ref >60)
Glucose, Bld: 119 mg/dL — ABNORMAL HIGH (ref 70–99)
Potassium: 3.9 mmol/L (ref 3.5–5.1)
Sodium: 133 mmol/L — ABNORMAL LOW (ref 135–145)
Total Bilirubin: 0.8 mg/dL (ref 0.3–1.2)
Total Protein: 7.5 g/dL (ref 6.5–8.1)

## 2020-01-04 LAB — CBC
HCT: 37.7 % — ABNORMAL LOW (ref 39.0–52.0)
Hemoglobin: 12.9 g/dL — ABNORMAL LOW (ref 13.0–17.0)
MCH: 31.6 pg (ref 26.0–34.0)
MCHC: 34.2 g/dL (ref 30.0–36.0)
MCV: 92.4 fL (ref 80.0–100.0)
Platelets: 211 10*3/uL (ref 150–400)
RBC: 4.08 MIL/uL — ABNORMAL LOW (ref 4.22–5.81)
RDW: 13 % (ref 11.5–15.5)
WBC: 8.8 10*3/uL (ref 4.0–10.5)
nRBC: 0 % (ref 0.0–0.2)

## 2020-01-04 LAB — URINALYSIS, ROUTINE W REFLEX MICROSCOPIC
Bacteria, UA: NONE SEEN
Bilirubin Urine: NEGATIVE
Glucose, UA: NEGATIVE mg/dL
Hgb urine dipstick: NEGATIVE
Ketones, ur: 5 mg/dL — AB
Leukocytes,Ua: NEGATIVE
Nitrite: NEGATIVE
Protein, ur: NEGATIVE mg/dL
Specific Gravity, Urine: 1.014 (ref 1.005–1.030)
pH: 5 (ref 5.0–8.0)

## 2020-01-04 LAB — SARS CORONAVIRUS 2 BY RT PCR (HOSPITAL ORDER, PERFORMED IN ~~LOC~~ HOSPITAL LAB): SARS Coronavirus 2: NEGATIVE

## 2020-01-04 LAB — ACETAMINOPHEN LEVEL: Acetaminophen (Tylenol), Serum: <10 ug/mL — ABNORMAL LOW (ref 10–30)

## 2020-01-04 LAB — SALICYLATE LEVEL: Salicylate Lvl: <7 mg/dL — ABNORMAL LOW (ref 7.0–30.0)

## 2020-01-04 LAB — ETHANOL: Alcohol, Ethyl (B): <10 mg/dL (ref <10)

## 2020-01-04 NOTE — ED Notes (Signed)
Hourly rounding reveals patient sleeping in room. No complaints, stable, in no acute distress. Q15 minute rounds and monitoring via Rover and Officer to continue.  

## 2020-01-04 NOTE — ED Notes (Signed)
Pt brought breakfast tray, pt wanted to sleep, left tray on bed.

## 2020-01-04 NOTE — ED Provider Notes (Signed)
[facility] Emergency Department Provider Note  ____________________________________________   First MD Initiated Contact with Patient 01/04/20 0104     (approximate)  I have reviewed the triage vital signs and the nursing notes.   HISTORY  Chief Complaint IVC  Level 5 caveat:  history/ROS limited by chronic dementia  HPI Edward Thompson is a 69 y.o. male with medical history as listed below which according to his daughter includes dementia.  The patient presents under involuntary commitment by [facility] because the patient was picked up trying to walk with his walker to [place].  The patient confirms this and does not see any problem with it.  He said he has some cousins there that he was going to visit in the middle of the night, walking from [place] to [place].  He seems confused as to why anyone is concerned about this.  He denies any pain and denies shortness of breath.  He says that he has received his COVID-19 vaccinations but we do not have documentation of this at this time.  He denies nausea and vomiting as well as abdominal pain.  He has no symptoms at this time so he cannot quantify the severity of them or their duration of onset.         Past Medical History:  Diagnosis Date   CAD (coronary artery disease)    Carotid stenosis, left 08/2017   Diabetes mellitus without complication (HCC)    Fracture of neck (HCC) 2008   fell off a roof. required halo x 4 months. also fractured alot of vertebrae   Heart attack (HCC) 2015   Heart disease    Hepatitis    6th grade    Hyperlipidemia    Hypertension    Myocardial infarction acute (HCC) 2015   Seizures (HCC)    taking phenobarbitol and dilantin. LAST SEIZURE WAS 2016. well controlled on meds   Sleep apnea    USES CPAP   Syncope 2019   Vertigo    Viral meningitis     Patient Active Problem List   Diagnosis Date Noted   Closed  fracture of second cervical vertebra (HCC) 04/19/2019   CAD (coronary artery disease) 03/14/2018   S/P carotid endarterectomy 09/09/2017   Foot drop, bilateral 09/09/2017   OSA on CPAP 08/25/2017   Symptomatic carotid artery stenosis 08/19/2017   Syncope 07/18/2017   Carotid stenosis 07/18/2017   Hyperlipidemia 07/18/2017   Essential hypertension 07/18/2017   Closed fracture of medial malleolus 03/15/2016   Dizziness 02/05/2016   S/P coronary artery stent placement 09/10/2013   Status post percutaneous transluminal coronary angioplasty 09/10/2013   History of non-ST elevation myocardial infarction (NSTEMI) 08/20/2013   Old myocardial infarction 08/20/2013   Seizure disorder (HCC) 01/22/2013   Ingrown nail 01/28/2005    Past Surgical History:  Procedure Laterality Date   CARDIAC SURGERY     CORONARY ANGIOPLASTY     PATIENT UNAWARE OF THIS   CORONARY ARTERY BYPASS GRAFT  2015   ENDARTERECTOMY Left 08/19/2017   Procedure: ENDARTERECTOMY CAROTID;  Surgeon: Renford Dills, MD;  Location: [facility] ORS;  Service: Vascular;  Laterality: Left;   HERNIA REPAIR Right 1985   inguinal    Prior to Admission medications   Medication Sig Start Date End Date Taking? Authorizing Provider  aspirin EC 81 MG tablet Take 81 mg by mouth at bedtime.     [provider]  clopidogrel (PLAVIX) 75 MG tablet Take 75  mg by mouth at bedtime.  01/28/15   [provider]  ezetimibe (ZETIA) 10 MG tablet  11/27/18   [provider]  isosorbide mononitrate (IMDUR) 30 MG 24 hr tablet Take 30 mg by mouth at bedtime.  04/29/17   [provider]  lisinopril (ZESTRIL) 5 MG tablet Take by mouth. 10/26/18   [provider]  losartan (COZAAR) 25 MG tablet Take 25 mg by mouth daily. 12/29/17   [provider]  meclizine (ANTIVERT) 25 MG tablet Take 50 mg by mouth 2 (two) times daily.  05/27/14   [provider]  metFORMIN (GLUCOPHAGE) 500 MG  tablet Take 500 mg by mouth daily.  04/28/17   [provider]  metoprolol succinate (TOPROL-XL) 50 MG 24 hr tablet Take 50 mg by mouth daily. Take with or immediately following a meal IN THE MORNING    [provider]  nitroGLYCERIN (NITROSTAT) 0.4 MG SL tablet Place 0.4 mg under the tongue every 5 (five) minutes as needed for chest pain.  08/22/13   [provider]  PHENobarbital (LUMINAL) 64.8 MG tablet Take 2 tablets (129.6 mg total) by mouth at bedtime. 08/25/17   Lorenso Quarry, NP  phenytoin (DILANTIN) 100 MG ER capsule Take 300 mg by mouth at bedtime.     [provider]  rosuvastatin (CRESTOR) 40 MG tablet Take 40 mg by mouth at bedtime.     [provider]  sertraline (ZOLOFT) 50 MG tablet Take 50 mg by mouth every morning. 01/16/19   [provider]  terbinafine (LAMISIL) 250 MG tablet TAKE 1 TABLET BY MOUTH ONCE DAILY IN THE MORNING 10/26/18   [provider]    Allergies Patient has no known allergies.  Family History  Problem Relation Age of Onset   Heart disease Mother    Heart attack Father     Social History Social History   Tobacco Use   Smoking status: Never Smoker   Smokeless tobacco: Never Used  Building services engineer Use: Never used  Substance Use Topics   Alcohol use: No   Drug use: No    Review of Systems Level 5 caveat:  history/ROS limited by chronic dementia   Patient denies pain and shortness of breath  ____________________________________________   PHYSICAL EXAM:  VITAL SIGNS: ED Triage Vitals  Enc Vitals Group     BP 01/04/20 0006 131/76     Pulse Rate 01/04/20 0006 74     Resp 01/04/20 0006 17     Temp 01/04/20 0008 98.8 F (37.1 C)     Temp Source 01/04/20 0008 Oral     SpO2 01/04/20 0006 98 %     Weight 01/03/20 2359 77.1 kg (170 lb)     Height 01/03/20 2359 1.626 m (5\' 4" )     Head Circumference --      Peak Flow --      Pain Score 01/03/20 2359 0     Pain Loc --       Pain Edu? --      Excl. in GC? --     Constitutional: Alert and to self and location. Eyes: Conjunctivae are normal.  Head: Atraumatic. Nose: No congestion/rhinnorhea. Mouth/Throat: Patient is wearing a mask. Neck: No stridor.  No meningeal signs.   Cardiovascular: Normal rate, regular rhythm. Good peripheral circulation. Grossly normal heart sounds. Respiratory: Normal respiratory effort.  No retractions. Gastrointestinal: Soft and nontender. No distention.  Musculoskeletal: No lower extremity tenderness nor edema. No gross  deformities of extremities. Neurologic:  Normal speech and language. No gross focal neurologic deficits are appreciated.  Skin:  Skin is warm, dry and intact. Psychiatric: Mood and affect are flat and blunted.  He has calm and cooperative but minimally communicative.  He admits to trying to walk with his walker to [place] from [place] and does not seem to have any problem with this.  He is exhibiting a lack of insight and judgment and lack of decision-making capacity.  ____________________________________________   LABS (all labs ordered are listed, but only abnormal results are displayed)  Labs Reviewed  COMPREHENSIVE METABOLIC PANEL - Abnormal; Notable for the following components:      Result Value   Sodium 133 (*)    Chloride 97 (*)    Glucose, Bld 119 (*)    Creatinine, Ser 0.46 (*)    All other components within normal limits  SALICYLATE LEVEL - Abnormal; Notable for the following components:   Salicylate Lvl <7.0 (*)    All other components within normal limits  ACETAMINOPHEN LEVEL - Abnormal; Notable for the following components:   Acetaminophen (Tylenol), Serum <10 (*)    All other components within normal limits  CBC - Abnormal; Notable for the following components:   RBC 4.08 (*)    Hemoglobin 12.9 (*)    HCT 37.7 (*)    All other components within normal limits  SARS CORONAVIRUS 2 BY RT PCR (HOSPITAL ORDER, PERFORMED IN [facility])  ETHANOL  URINE DRUG SCREEN, QUALITATIVE ([facility] ONLY)   ____________________________________________  EKG  No indication for emergent EKG ____________________________________________  RADIOLOGY I, Loleta Six, personally viewed and evaluated these images (plain radiographs) as part of my medical decision making, as well as reviewing the written report by the radiologist.  ED MD interpretation: No indication for emergent imaging  Official radiology report(s): No results found.  ____________________________________________   PROCEDURES   Procedure(s) performed (including Critical Care):  Procedures   ____________________________________________   INITIAL IMPRESSION / MDM / ASSESSMENT AND PLAN / ED COURSE  As part of my medical decision making, I reviewed the following data within the electronic MEDICAL RECORD NUMBER Nursing notes reviewed and incorporated, Labs reviewed , Old chart reviewed, A consult was requested and obtained from this/these consultant(s) Psychiatry and Notes from prior ED visits   Differential diagnosis includes, but is not limited to, dementia, delirium, acute metabolic or electrolyte abnormality, medication or drug side effect.  The patient's vital signs are normal and stable.  His lab work is within normal limits and he has no evidence of toxic overdose such as salicylate or acetaminophen.  Alcohol level is normal.  Urine has not yet been collected.  Given the patient's reported history of dementia and his lack of insight, judgment, and apparent decision-making capacity, I am upholding the involuntary commitment.  He has no evidence of an emergent medical condition and has been medically clear for psychiatric disposition.  The patient has been placed in psychiatric observation due to the need to provide a safe environment for the patient while obtaining psychiatric consultation and evaluation, as well as ongoing medical and medication  management to treat the patient's condition.  The patient has been placed under full IVC at this time.          ____________________________________________  FINAL CLINICAL IMPRESSION(S) / ED DIAGNOSES  Final diagnoses:  Dementia with behavioral disturbance, unspecified dementia type (HCC)     MEDICATIONS GIVEN DURING THIS VISIT:  Medications - No  data to display   ED Discharge Orders    None      *Please note:  ALEPH NICKSON was evaluated in Emergency Department on 01/04/2020 for the symptoms described in the history of present illness. He was evaluated in the context of the global COVID-19 pandemic, which necessitated consideration that the patient might be at risk for infection with the SARS-CoV-2 virus that causes COVID-19. Institutional protocols and algorithms that pertain to the evaluation of patients at risk for COVID-19 are in a state of rapid change based on information released by regulatory bodies including the CDC and federal and state organizations. These policies and algorithms were followed during the patient's care in the ED.  Some ED evaluations and interventions may be delayed as a result of limited staffing during and after the pandemic.*  Note:  This document was prepared using Dragon voice recognition software and may include unintentional dictation errors.   Loleta Chaney, MD 01/04/20 [phone]

## 2020-01-04 NOTE — ED Notes (Signed)
Report to include Situation, Background, Assessment, and Recommendations received from [facility]. Patient alert, warm and dry, in no acute distress. Patient denies SI, HI, AVH and pain. Patient made aware of Q15 minute rounds and Psychologist, counselling presence for their safety. Patient instructed to come to me with needs or concerns.

## 2020-01-04 NOTE — ED Notes (Signed)
25 dimes (.$.10 x 25) 44 quarters ($.25 x 44) 41 pennies ($.01 x 41) 18 nickels ($.05 x 18) Wallet Black t-shirt White tennis shoes [employer] belt Black socks Grey pants grey underwear 1 multi-tool Swiss Youth worker One key Film/video editor 1 cell phone with red case 2 chap stick One handkerchief

## 2020-01-04 NOTE — BH Assessment (Addendum)
Assessment Note  Edward Thompson is an 69 y.o. male who presents to the ER due to his family having concerns about his current behaviors and mental state. Per the report of the patient's daughter Edward Thompson [phone]), the last three days, the patient has been increasingly irritable and difficult to redirect. In the past, they were able to redirect and manage him and his behaviors. On yesterday (01/03/2020), he said he was going to walk to [place] to be with other family members. The daughter and other sibling tried to "talk him out of it" but was unsuccessful. Patient left the home without any clothes, medications, only with his walker. Family made several attempts to stop him but he continued with trying to leave. That is when they contacted 911. Daughter reports, he was tested to for dementia but he wasn't diagnosed with it. However, he is experiencing some memory loss. Patient currently lives with his son and his spouse and when stable, he is able to return.  During the interview, the patient was calm and pleasant but withdrawn and guarded. He was able to provide appropriate answers to majority of the questions. He was oriented to person, place, date and situation. He admits he was trying to walk to [place] and was upset with family. When asked About the family and other personal family information he, answered by saying he wanted to go home, and why haven't he been discharged. Throughout the interview, the patient denied SI/HI and AV/H. He also denies abuse   Past Medical History:  Past Medical History:  Diagnosis Date  . CAD (coronary artery disease)   . Carotid stenosis, left 08/2017  . Diabetes mellitus without complication (HCC)   . Fracture of neck (HCC) 2008   fell off a roof. required halo x 4 months. also fractured alot of vertebrae  . Heart attack (HCC) 2015  . Heart disease   . Hepatitis    6th grade   . Hyperlipidemia   . Hypertension   . Myocardial infarction  acute (HCC) 2015  . Seizures (HCC)    taking phenobarbitol and dilantin. LAST SEIZURE WAS 2016. well controlled on meds  . Sleep apnea    USES CPAP  . Syncope 2019  . Vertigo   . Viral meningitis     Past Surgical History:  Procedure Laterality Date  . CARDIAC SURGERY    . CORONARY ANGIOPLASTY     PATIENT UNAWARE OF THIS  . CORONARY ARTERY BYPASS GRAFT  2015  . ENDARTERECTOMY Left 08/19/2017   Procedure: ENDARTERECTOMY CAROTID;  Surgeon: Renford Dills, MD;  Location: [facility] ORS;  Service: Vascular;  Laterality: Left;  . HERNIA REPAIR Right 1985   inguinal    Family History:  Family History  Problem Relation Age of Onset  . Heart disease Mother   . Heart attack Father     Social History:  reports that he has never smoked. He has never used smokeless tobacco. He reports that he does not drink alcohol and does not use drugs.  Additional Social History:  Alcohol / Drug Use Pain Medications: See PTA Prescriptions: See PTA Over the Counter: See PTA History of alcohol / drug use?: No history of alcohol / drug abuse Longest period of sobriety (when/how long): n/a  CIWA: CIWA-Ar BP: 131/76 Pulse Rate: 74 COWS:    Allergies: No Known Allergies  Home Medications: (Not in a hospital admission)   OB/GYN Status:  No LMP for male patient.  General Assessment Data Location of  Assessment: [facility] ED TTS Assessment: In system Is this a Tele or Face-to-Face Assessment?: Face-to-Face Is this an Initial Assessment or a Re-assessment for this encounter?: Initial Assessment Patient Accompanied by:: N/A Language Other than English: No Living Arrangements: Other (Comment) (Private Home) What gender do you identify as?: Male Date Telepsych consult ordered in [facility]: 01/04/20 Time Telepsych consult ordered in [facility]: 0114 Marital status: Single Pregnancy Status: No Living Arrangements: Children Can pt return to current living arrangement?: Yes Admission Status: Involuntary Petitioner:  Family member Is patient capable of signing voluntary admission?: No (Under IVC) Referral Source: Self/Family/Friend Insurance type: [facility] MCR  Medical Screening Exam [facility] Walk-in ONLY) Medical Exam completed: Yes  Crisis Care Plan Living Arrangements: Children Legal Guardian: Other: (Self) Name of Psychiatrist: Reports of none Name of Therapist: Reports of none  Education Status Is patient currently in school?: No Is the patient employed, unemployed or receiving disability?: Unemployed  Risk to self with the past 6 months Suicidal Ideation: No Has patient been a risk to self within the past 6 months prior to admission? : No Suicidal Intent: No Has patient had any suicidal intent within the past 6 months prior to admission? : No Is patient at risk for suicide?: No Suicidal Plan?: No Has patient had any suicidal plan within the past 6 months prior to admission? : No Access to Means: No What has been your use of drugs/alcohol within the last 12 months?: Reports of none Previous Attempts/Gestures: No How many times?: 0 Other Self Harm Risks: Reports of none Triggers for Past Attempts: None known Intentional Self Injurious Behavior: None Family Suicide History: Unknown Recent stressful life event(s): Other (Comment) Persecutory voices/beliefs?: No Depression: Yes Depression Symptoms: Isolating, Feeling angry/irritable, Insomnia Substance abuse history and/or treatment for substance abuse?: No Suicide prevention information given to non-admitted patients: Not applicable  Risk to Others within the past 6 months Homicidal Ideation: No Does patient have any lifetime risk of violence toward others beyond the six months prior to admission? : No Thoughts of Harm to Others: No Current Homicidal Intent: No Current Homicidal Plan: No Access to Homicidal Means: No Identified Victim: Reports of none History of harm to others?: No Assessment of Violence: None Noted Violent Behavior  Description: Reports of none Does patient have access to weapons?: No Criminal Charges Pending?: No Does patient have a court date: No Is patient on probation?: No  Psychosis Hallucinations: None noted Delusions: Persecutory, Grandiose  Mental Status Report Appearance/Hygiene: Disheveled Eye Contact: Poor Motor Activity: Unable to assess (Patient laying in the bed) Speech: Logical/coherent, Unremarkable Level of Consciousness: Alert Mood: Pleasant Affect: Appropriate to circumstance Anxiety Level: None Thought Processes: Coherent, Relevant Judgement: Partial Orientation: Person, Place, Time, Situation, Appropriate for developmental age Obsessive Compulsive Thoughts/Behaviors: Minimal  Cognitive Functioning Concentration: Normal (Per the daughter) Memory: Recent Impaired, Remote Intact Is patient IDD: No Insight: Fair Impulse Control: Fair Appetite: Fair Have you had any weight changes? : No Change Sleep: Decreased Total Hours of Sleep:  (Unknown, per the report of the daughter) Vegetative Symptoms: None  ADLScreening [facility] Assessment Services) Patient's cognitive ability adequate to safely complete daily activities?: Yes Patient able to express need for assistance with ADLs?: Yes Independently performs ADLs?: Yes (appropriate for developmental age)  Prior Inpatient Therapy Prior Inpatient Therapy: No  Prior Outpatient Therapy Prior Outpatient Therapy: No Does patient have an ACCT team?: No Does patient have Intensive In-House Services?  : No Does patient have [facility] services? : No Does patient have P4CC services?: No  ADL Screening (condition at time of admission) Patient's cognitive ability adequate to safely complete daily activities?: Yes Patient able to express need for assistance with ADLs?: Yes Independently performs ADLs?: Yes (appropriate for developmental age)  Abuse/Neglect Assessment (Assessment to be complete while patient is alone) Abuse/Neglect  Assessment Can Be Completed: Yes Physical Abuse: Denies Verbal Abuse: Denies Sexual Abuse: Denies Exploitation of patient/patient's resources: Denies Self-Neglect: Denies Values / Beliefs Cultural Requests During Hospitalization: None Spiritual Requests During Hospitalization: None Consults Spiritual Care Consult Needed: No Transition of Care Team Consult Needed: No Advance Directives (For Healthcare) Does Patient Have a Medical Advance Directive?: No Would patient like information on creating a medical advance directive?: No - Patient declined  Disposition:  Disposition Initial Assessment Completed for this Encounter: Yes  On Site Evaluation by:   Reviewed with Physician:    Lilyan Gilford MS, LCAS, [facility], NCC Therapeutic Triage Specialist 01/04/2020 2:01 PM

## 2020-01-04 NOTE — BH Assessment (Signed)
Attempted to assess patient, patient difficult to arouse at this time.

## 2020-01-04 NOTE — ED Notes (Addendum)
TTS at the bedside; pt sleeping and TTS unable to assess pt.

## 2020-01-04 NOTE — BH Assessment (Addendum)
Referral information for Psychiatric Hospitalization faxed to;   [facility] [phone]),   . [facility] ([phone]),  . [facility] [phone]),   . [facility] [phone] -or- [phone]),   . [facility] [phone] or [phone])  . [place] [phone] or [phone]), No beds, unit currently closed  . [facility] [phone]).

## 2020-01-04 NOTE — ED Notes (Signed)
Hourly rounding reveals patient awake in room. No complaints, stable, in no acute distress. Q15 minute rounds and monitoring via Rover and Officer to continue.  

## 2020-01-05 DIAGNOSIS — F4329 Adjustment disorder with other symptoms: Secondary | ICD-10-CM | POA: Diagnosis present

## 2020-01-05 DIAGNOSIS — R4689 Other symptoms and signs involving appearance and behavior: Secondary | ICD-10-CM | POA: Diagnosis present

## 2020-01-05 DIAGNOSIS — F4325 Adjustment disorder with mixed disturbance of emotions and conduct: Secondary | ICD-10-CM | POA: Diagnosis present

## 2020-01-05 DIAGNOSIS — R4189 Other symptoms and signs involving cognitive functions and awareness: Secondary | ICD-10-CM

## 2020-01-05 DIAGNOSIS — F05 Delirium due to known physiological condition: Secondary | ICD-10-CM | POA: Diagnosis present

## 2020-01-05 MED ORDER — ASPIRIN EC 325 MG PO TBEC
325.0000 mg | DELAYED_RELEASE_TABLET | Freq: Every day | ORAL | Status: DC
  Administered 2020-01-05 – 2020-01-09 (×5): 325 mg via ORAL
  Filled 2020-01-05 (×5): qty 1

## 2020-01-05 MED ORDER — PHENYTOIN SODIUM EXTENDED 100 MG PO CAPS
300.0000 mg | ORAL_CAPSULE | Freq: Every day | ORAL | Status: DC
  Administered 2020-01-05 – 2020-01-08 (×3): 300 mg via ORAL
  Filled 2020-01-05 (×4): qty 3

## 2020-01-05 MED ORDER — METOPROLOL SUCCINATE ER 50 MG PO TB24
50.0000 mg | ORAL_TABLET | Freq: Every day | ORAL | Status: DC
  Administered 2020-01-05 – 2020-01-08 (×4): 50 mg via ORAL
  Filled 2020-01-05 (×5): qty 1

## 2020-01-05 MED ORDER — PHENOBARBITAL 64.8 MG PO TABS: 129.6000 mg | ORAL_TABLET | Freq: Every day | ORAL | Status: DC

## 2020-01-05 MED ORDER — CLOPIDOGREL BISULFATE 75 MG PO TABS
75.0000 mg | ORAL_TABLET | Freq: Every day | ORAL | Status: DC
  Administered 2020-01-05 – 2020-01-09 (×5): 75 mg via ORAL
  Filled 2020-01-05 (×5): qty 1

## 2020-01-05 MED ORDER — RISPERIDONE 0.5 MG PO TABS
0.5000 mg | ORAL_TABLET | Freq: Every day | ORAL | 1 refills | Status: DC
Start: 2020-01-05 — End: 2020-01-09

## 2020-01-05 MED ORDER — SERTRALINE HCL 50 MG PO TABS: 50.0000 mg | ORAL_TABLET | Freq: Every morning | ORAL | Status: DC

## 2020-01-05 MED ORDER — ROSUVASTATIN CALCIUM 20 MG PO TABS
40.0000 mg | ORAL_TABLET | Freq: Every day | ORAL | Status: DC
  Administered 2020-01-06 – 2020-01-09 (×4): 40 mg via ORAL
  Filled 2020-01-05 (×7): qty 2

## 2020-01-05 MED ORDER — TERBINAFINE HCL 250 MG PO TABS: 250.0000 mg | ORAL_TABLET | Freq: Every day | ORAL | Status: DC

## 2020-01-05 MED ORDER — METFORMIN HCL 500 MG PO TABS
500.0000 mg | ORAL_TABLET | Freq: Every day | ORAL | Status: DC
  Administered 2020-01-05 – 2020-01-09 (×5): 500 mg via ORAL
  Filled 2020-01-05 (×5): qty 1

## 2020-01-05 MED ORDER — MECLIZINE HCL 25 MG PO TABS: 50.0000 mg | ORAL_TABLET | Freq: Two times a day (BID) | ORAL | Status: DC | PRN

## 2020-01-05 MED ORDER — PHENOBARBITAL 32.4 MG PO TABS
64.8000 mg | ORAL_TABLET | Freq: Two times a day (BID) | ORAL | Status: DC
  Administered 2020-01-05 – 2020-01-09 (×7): 64.8 mg via ORAL
  Filled 2020-01-05 (×3): qty 2
  Filled 2020-01-05: qty 1
  Filled 2020-01-05 (×7): qty 2

## 2020-01-05 MED ORDER — RISPERIDONE 1 MG PO TABS
0.5000 mg | ORAL_TABLET | Freq: Every day | ORAL | Status: DC
  Administered 2020-01-05 – 2020-01-09 (×3): 0.5 mg via ORAL
  Filled 2020-01-05 (×4): qty 1

## 2020-01-05 MED ORDER — LOSARTAN POTASSIUM 50 MG PO TABS: 25.0000 mg | ORAL_TABLET | Freq: Every day | ORAL | Status: DC

## 2020-01-05 MED ORDER — RISPERIDONE 0.5 MG PO TABS: 0.5000 mg | ORAL_TABLET | Freq: Every day | ORAL | 1 refills | Status: DC

## 2020-01-05 MED ORDER — ISOSORBIDE MONONITRATE ER 60 MG PO TB24
30.0000 mg | ORAL_TABLET | Freq: Every day | ORAL | Status: DC
  Administered 2020-01-05 – 2020-01-09 (×4): 30 mg via ORAL
  Filled 2020-01-05 (×4): qty 1

## 2020-01-05 MED ORDER — EZETIMIBE 10 MG PO TABS
10.0000 mg | ORAL_TABLET | Freq: Every day | ORAL | Status: DC
  Administered 2020-01-05 – 2020-01-09 (×5): 10 mg via ORAL
  Filled 2020-01-05 (×6): qty 1

## 2020-01-05 NOTE — ED Notes (Signed)
Patient sleeping

## 2020-01-05 NOTE — ED Notes (Signed)
Hourly rounding reveals patient sleeping in room. No complaints, stable, in no acute distress. Q15 minute rounds and monitoring via Rover and Officer to continue.  

## 2020-01-05 NOTE — BH Assessment (Addendum)
Referral check for Psychiatric Hospitalization:  [facility] [phone]), 11:43 PM Patient denied due to dx of dementia and multiple medical issues.  -Please be advised:  [facility] is at capacity until Monday morning.    [facility] ([phone]), 11:51 PM Referral re-fax requested. Task completed at 12am. -4:25 AM spoke with Mindy, who referred this writer to [facility] [phone] to determine pt's acceptance. [facility] provided a fax number [phone] and requested that the referral be faxed. Task completed 5:15 AM. -5:26 AM Morrie Sheldon confirmed that the fax was received. Pt is currently under review.    [address] [phone]),    [facility] [phone] -or- [phone]), 12:26 AM No answer   [facility] [phone] or [phone]) 12:29 AM No answer   [facility] [phone] or [phone]), No beds, unit currently closed   [facility] [phone]).12:31 AM Left HIPPA Compliant message, requesting call back.

## 2020-01-05 NOTE — ED Notes (Signed)
Hourly rounding reveals patient awake in room. No complaints, stable, in no acute distress. Q15 minute rounds and monitoring via Rover and Officer to continue.  

## 2020-01-05 NOTE — ED Notes (Signed)
Care of patient, patient slept comfortably throughout the night.  Patient reports unable to ambulate to bathroom due to his feet being swollen, B/L lower extremities assessed, no obvious swelling noted, urinal provided. Patient able to utilize urinal w/o difficulties. Safety maintained will monitor.

## 2020-01-05 NOTE — ED Notes (Addendum)
Edward Thompson (son) called  [phone] for ETA  Patient has been discharged back to his care. As per son requested to speak Md, due to patient having a 72 hour IVC hold as per son it has not been 72 hours. TTS called and advised of this conversation will be calling family back to further discuss discharge plans related to son this morning.

## 2020-01-05 NOTE — ED Provider Notes (Signed)
Emergency Medicine Observation Re-evaluation Note  Edward Thompson is a 69 y.o. male, seen on rounds today.  Pt initially presented to the ED for complaints of IVC Currently, the patient is resting comfortably voicing no medical complaints.  Physical Exam  BP 130/62 (BP Location: Right Arm)   Pulse 82   Temp 98.6 F (37 C) (Oral)   Resp 20   Ht 5\' 4"  (1.626 m)   Wt 77.1 kg   SpO2 99%   BMI 29.18 kg/m  Physical Exam General: Resting in no acute distress Cardiac: No cyanosis Lungs: Equal rise and fall Psych: Unremarkable affect  ED Course / MDM  EKG:    I have reviewed the labs performed to date as well as medications administered while in observation.  Recent changes in the last 24 hours include no changes overnight.  Plan  Current plan is for psychiatric disposition; patient has been referred to multiple facilities. Patient is under full IVC at this time.   , MD 01/05/20 [phone]

## 2020-01-05 NOTE — ED Notes (Addendum)
Patient dressed back out and placed in room. As per TTS patient is now awaiting placement. Belongings and clothes placed in B[facility] patient storage closet.

## 2020-01-05 NOTE — Consult Note (Signed)
[facility] Face-to-Face Psychiatry Consult   Reason for Consult:  Sundowning symptoms Referring Physician:  EDP Patient Identification: Edward Thompson MRN:  [id number] Principal Diagnosis: Adjustment disorder with mixed disturbance of emotions and conduct Diagnosis:  Principal Problem:   Adjustment disorder with mixed disturbance of emotions and conduct   Total Time spent with patient: 45 minutes  Subjective:   Edward Thompson is a 69 y.o. male patient admitted with behavior and cognitive changes.  Patient seen and evaluated in person by this provider.  During assessment he made no eye contact as he was turned to his side and looking downwards.  He was upset that he was here and reports that everything is "all right" minimizes his symptoms.  When asked who he lives with he said "the big guy which and 5 kids", evidently this is his daughter-in-law he is referring to.  His family is concerned because "he will not listen" and his behaviors have been increasing in the evenings with agitation.  The other night he tried to walk to [place] and the family had difficulty redirecting him.  When asked, the patient replied "this is only thing I know to do".  Patient denies suicidal/homicidal ideations, hallucinations, mania, and substance abuse.  His son and daughter are concerned because he is not taking his medications and they recently discovered that he has not taking some of his medications since November of last year.  Discussed the sundowning syndrome and medications to take prior to this to prevent agitation and behavior changes.  Family would like for medications to be started and behavior stabilized prior to discharge.  Seeking geriatric psychiatric placement at this time unless patient stabilizes.  HPI per [facility] Specialist [name]: Edward Thompson is an 69 y.o. male who presents to the ER due to his family having concerns about his current behaviors and mental state. Per the report of the patient's  daughter [name] [phone]), the last three days, the patient has been increasingly irritable and difficult to redirect. In the past, they were able to redirect and manage him and his behaviors. On yesterday (01/03/2020), he said he was going to walk to [place] to be with other family members. The daughter and other sibling tried to "talk him out of it" but was unsuccessful. Patient left the home without any clothes, medications, only with his walker. Family made several attempts to stop him but he continued with trying to leave. That is when they contacted 911. Daughter reports, he was tested to for dementia but he wasn't diagnosed with it. However, he is experiencing some memory loss. Patient currently lives with his son and his spouse and when stable, he is able to return.  During the interview, the patient was calm and pleasant but withdrawn and guarded. He was able to provide appropriate answers to majority of the questions. He was oriented to person, place, date and situation. He admits he was trying to walk to [place] and was upset with family. When asked About the family and other personal family information he, answered by saying he wanted to go home, and why haven't he been discharged. Throughout the interview, the patient denied SI/HI and AV/H. He also denies abuse   Past Psychiatric History: memory issues  Risk to Self: Suicidal Ideation: No Suicidal Intent: No Is patient at risk for suicide?: No Suicidal Plan?: No Access to Means: No What has been your use of drugs/alcohol within the last 12 months?: Reports of none How many times?:  0 Other Self Harm Risks: Reports of none Triggers for Past Attempts: None known Intentional Self Injurious Behavior: None Risk to Others: Homicidal Ideation: No Thoughts of Harm to Others: No Current Homicidal Intent: No Current Homicidal Plan: No Access to Homicidal Means: No Identified Victim: Reports of none History of harm to  others?: No Assessment of Violence: None Noted Violent Behavior Description: Reports of none Does patient have access to weapons?: No Criminal Charges Pending?: No Does patient have a court date: No Prior Inpatient Therapy: Prior Inpatient Therapy: No Prior Outpatient Therapy: Prior Outpatient Therapy: No Does patient have an ACCT team?: No Does patient have Intensive In-House Services?  : No Does patient have [facility] services? : No Does patient have P4CC services?: No  Past Medical History:  Past Medical History:  Diagnosis Date  . CAD (coronary artery disease)   . Carotid stenosis, left 08/2017  . Diabetes mellitus without complication (HCC)   . Fracture of neck (HCC) 2008   fell off a roof. required halo x 4 months. also fractured alot of vertebrae  . Heart attack (HCC) 2015  . Heart disease   . Hepatitis    6th grade   . Hyperlipidemia   . Hypertension   . Myocardial infarction acute (HCC) 2015  . Seizures (HCC)    taking phenobarbitol and dilantin. LAST SEIZURE WAS 2016. well controlled on meds  . Sleep apnea    USES CPAP  . Syncope 2019  . Vertigo   . Viral meningitis     Past Surgical History:  Procedure Laterality Date  . CARDIAC SURGERY    . CORONARY ANGIOPLASTY     PATIENT UNAWARE OF THIS  . CORONARY ARTERY BYPASS GRAFT  2015  . ENDARTERECTOMY Left 08/19/2017   Procedure: ENDARTERECTOMY CAROTID;  Surgeon: Renford Dills, MD;  Location: [facility] ORS;  Service: Vascular;  Laterality: Left;  . HERNIA REPAIR Right 1985   inguinal   Family History:  Family History  Problem Relation Age of Onset  . Heart disease Mother   . Heart attack Father    Family Psychiatric  History: none Social History:  Social History   Substance and Sexual Activity  Alcohol Use No     Social History   Substance and Sexual Activity  Drug Use No    Social History   Socioeconomic History  . Marital status: Widowed    Spouse name: Not on file  . Number of children: Not on  file  . Years of education: Not on file  . Highest education level: Not on file  Occupational History  . Not on file  Tobacco Use  . Smoking status: Never Smoker  . Smokeless tobacco: Never Used  Vaping Use  . Vaping Use: Never used  Substance and Sexual Activity  . Alcohol use: No  . Drug use: No  . Sexual activity: Not Currently  Other Topics Concern  . Not on file  Social History Narrative  . Not on file   Social Determinants of Health   Financial Resource Strain:   . Difficulty of Paying Living Expenses: Not on file  Food Insecurity:   . Worried About Programme researcher, broadcasting/film/video in the Last Year: Not on file  . Ran Out of Food in the Last Year: Not on file  Transportation Needs:   . Lack of Transportation (Medical): Not on file  . Lack of Transportation (Non-Medical): Not on file  Physical Activity:   . Days of Exercise per Week: Not on  file  . Minutes of Exercise per Session: Not on file  Stress:   . Feeling of Stress : Not on file  Social Connections:   . Frequency of Communication with Friends and Family: Not on file  . Frequency of Social Gatherings with Friends and Family: Not on file  . Attends Religious Services: Not on file  . Active Member of Clubs or Organizations: Not on file  . Attends Banker Meetings: Not on file  . Marital Status: Not on file   Additional Social History:    Allergies:  No Known Allergies  Labs:  Results for orders placed or performed during the hospital encounter of 01/04/20 (from the past 48 hour(s))  Comprehensive metabolic panel     Status: Abnormal   Collection Time: 01/04/20 12:06 AM  Result Value Ref Range   Sodium 133 (L) 135 - 145 mmol/L   Potassium 3.9 3.5 - 5.1 mmol/L   Chloride 97 (L) 98 - 111 mmol/L   CO2 23 22 - 32 mmol/L   Glucose, Bld 119 (H) 70 - 99 mg/dL    Comment: Glucose reference range applies only to samples taken after fasting for at least 8 hours.   BUN 15 8 - 23 mg/dL   Creatinine, Ser 1.61  (L) 0.61 - 1.24 mg/dL   Calcium 9.3 8.9 - 09.6 mg/dL   Total Protein 7.5 6.5 - 8.1 g/dL   Albumin 4.5 3.5 - 5.0 g/dL   AST 24 15 - 41 U/L   ALT 24 0 - 44 U/L   Alkaline Phosphatase 87 38 - 126 U/L   Total Bilirubin 0.8 0.3 - 1.2 mg/dL   GFR calc non Af Amer >60 >60 mL/min   GFR calc Af Amer >60 >60 mL/min   Anion gap 13 5 - 15    Comment: Performed at [facility], [address] [place], [place] [address]  Ethanol     Status: None   Collection Time: 01/04/20 12:06 AM  Result Value Ref Range   Alcohol, Ethyl (B) <10 <10 mg/dL    Comment: (NOTE) Lowest detectable limit for serum alcohol is 10 mg/dL.  For medical purposes only. Performed at [facility], [address] [place], [place] [address]   Salicylate level     Status: Abnormal   Collection Time: 01/04/20 12:06 AM  Result Value Ref Range   Salicylate Lvl <7.0 (L) 7.0 - 30.0 mg/dL    Comment: Performed at [facility], [address] [place], [place] [address]  Acetaminophen level     Status: Abnormal   Collection Time: 01/04/20 12:06 AM  Result Value Ref Range   Acetaminophen (Tylenol), Serum <10 (L) 10 - 30 ug/mL    Comment: (NOTE) Therapeutic concentrations vary significantly. A range of 10-30 ug/mL  may be an effective concentration for many patients. However, some  are best treated at concentrations outside of this range. Acetaminophen concentrations >150 ug/mL at 4 hours after ingestion  and >50 ug/mL at 12 hours after ingestion are often associated with  toxic reactions.  Performed at [facility], [address] [place], [place] [address]   cbc     Status: Abnormal   Collection Time: 01/04/20 12:06 AM  Result Value Ref Range   WBC 8.8 4.0 - 10.5 K/uL   RBC 4.08 (L) 4.22 - 5.81 MIL/uL   Hemoglobin 12.9 (L) 13.0 - 17.0 g/dL   HCT 30.8 (L) 39 - 52 %   MCV 92.4 80.0 -  100.0 fL   MCH 31.6 26.0 - 34.0 pg   MCHC 34.2 30.0 - 36.0 g/dL   RDW 86.7 67.2 - 09.4 %    Platelets 211 150 - 400 K/uL   nRBC 0.0 0.0 - 0.2 %    Comment: Performed at [facility], [address] [place], [place] [address]  Urinalysis, Routine w reflex microscopic     Status: Abnormal   Collection Time: 01/04/20  1:27 AM  Result Value Ref Range   Color, Urine YELLOW (A) YELLOW   APPearance HAZY (A) CLEAR   Specific Gravity, Urine 1.014 1.005 - 1.030   pH 5.0 5.0 - 8.0   Glucose, UA NEGATIVE NEGATIVE mg/dL   Hgb urine dipstick NEGATIVE NEGATIVE   Bilirubin Urine NEGATIVE NEGATIVE   Ketones, ur 5 (A) NEGATIVE mg/dL   Protein, ur NEGATIVE NEGATIVE mg/dL   Nitrite NEGATIVE NEGATIVE   Leukocytes,Ua NEGATIVE NEGATIVE   WBC, UA 0-5 0 - 5 WBC/hpf   Bacteria, UA NONE SEEN NONE SEEN   Squamous Epithelial / LPF 0-5 0 - 5   Mucus PRESENT     Comment: Performed at [facility], [address] [place], [place] [address]  SARS Coronavirus 2 by RT PCR (hospital order, performed in [facility] hospital lab) Nasopharyngeal Nasopharyngeal Swab     Status: None   Collection Time: 01/04/20 11:38 AM   Specimen: Nasopharyngeal Swab  Result Value Ref Range   SARS Coronavirus 2 NEGATIVE NEGATIVE    Comment: (NOTE) SARS-CoV-2 target nucleic acids are NOT DETECTED.  The SARS-CoV-2 RNA is generally detectable in upper and lower respiratory specimens during the acute phase of infection. The lowest concentration of SARS-CoV-2 viral copies this assay can detect is 250 copies / mL. A negative result does not preclude SARS-CoV-2 infection and should not be used as the sole basis for treatment or other patient management decisions.  A negative result may occur with improper specimen collection / handling, submission of specimen other than nasopharyngeal swab, presence of viral mutation(s) within the areas targeted by this assay, and inadequate number of viral copies (<250 copies / mL). A negative result must be combined with clinical observations, patient history, and  epidemiological information.  Fact Sheet for Patients:   BoilerBrush.com.cy  Fact Sheet for Healthcare Providers: https://pope.com/  This test is not yet approved or  cleared by the [place] FDA and has been authorized for detection and/or diagnosis of SARS-CoV-2 by FDA under an Emergency Use Authorization (EUA).  This EUA will remain in effect (meaning this test can be used) for the duration of the COVID-19 declaration under Section 564(b)(1) of the Act, 21 U.S.C. section 360bbb-3(b)(1), unless the authorization is terminated or revoked sooner.  Performed at [facility], [address] [place], [place] [address]   Urine Drug Screen, Qualitative     Status: Abnormal   Collection Time: 01/04/20  7:45 PM  Result Value Ref Range   Tricyclic, Ur Screen NONE DETECTED NONE DETECTED   Amphetamines, Ur Screen NONE DETECTED NONE DETECTED   MDMA (Ecstasy)Ur Screen NONE DETECTED NONE DETECTED   Cocaine Metabolite,Ur [place] NONE DETECTED NONE DETECTED   Opiate, Ur Screen NONE DETECTED NONE DETECTED   Phencyclidine (PCP) Ur S NONE DETECTED NONE DETECTED   Cannabinoid 50 Ng, Ur [place] NONE DETECTED NONE DETECTED   Barbiturates, Ur Screen POSITIVE (A) NONE DETECTED   Benzodiazepine, Ur Scrn NONE DETECTED NONE DETECTED   Methadone Scn, Ur NONE DETECTED NONE DETECTED  Comment: (NOTE) Tricyclics + metabolites, urine    Cutoff 1000 ng/mL Amphetamines + metabolites, urine  Cutoff 1000 ng/mL MDMA (Ecstasy), urine              Cutoff 500 ng/mL Cocaine Metabolite, urine          Cutoff 300 ng/mL Opiate + metabolites, urine        Cutoff 300 ng/mL Phencyclidine (PCP), urine         Cutoff 25 ng/mL Cannabinoid, urine                 Cutoff 50 ng/mL Barbiturates + metabolites, urine  Cutoff 200 ng/mL Benzodiazepine, urine              Cutoff 200 ng/mL Methadone, urine                   Cutoff 300 ng/mL  The urine drug screen provides only a  preliminary, unconfirmed analytical test result and should not be used for non-medical purposes. Clinical consideration and professional judgment should be applied to any positive drug screen result due to possible interfering substances. A more specific alternate chemical method must be used in order to obtain a confirmed analytical result. Gas chromatography / mass spectrometry (GC/MS) is the preferred confirm atory method. Performed at [facility], [address] [place], [place] [address]     Current Facility-Administered Medications  Medication Dose Route Frequency Provider Last Rate Last Admin  . losartan (COZAAR) tablet 25 mg  25 mg Oral Daily Charm Rings, NP      . meclizine (ANTIVERT) tablet 50 mg  50 mg Oral BID Charm Rings, NP      . metFORMIN (GLUCOPHAGE) tablet 500 mg  500 mg Oral Daily Charm Rings, NP      . metoprolol succinate (TOPROL-XL) 24 hr tablet 50 mg  50 mg Oral Daily Issak Goley Y, NP      . risperiDONE (RISPERDAL) tablet 0.5 mg  0.5 mg Oral Q1500 Charm Rings, NP      . sertraline (ZOLOFT) tablet 50 mg  50 mg Oral q morning - 10a Charm Rings, NP      . terbinafine (LAMISIL) tablet 250 mg  250 mg Oral Daily Charm Rings, NP       Current Outpatient Medications  Medication Sig Dispense Refill  . aspirin EC 81 MG tablet Take 81 mg by mouth at bedtime.     . clopidogrel (PLAVIX) 75 MG tablet Take 75 mg by mouth at bedtime.     Marland Kitchen ezetimibe (ZETIA) 10 MG tablet     . isosorbide mononitrate (IMDUR) 30 MG 24 hr tablet Take 30 mg by mouth at bedtime.     Marland Kitchen lisinopril (ZESTRIL) 5 MG tablet Take by mouth.    . losartan (COZAAR) 25 MG tablet Take 25 mg by mouth daily.  1  . meclizine (ANTIVERT) 25 MG tablet Take 50 mg by mouth 2 (two) times daily.     . metFORMIN (GLUCOPHAGE) 500 MG tablet Take 500 mg by mouth daily.     . metoprolol succinate (TOPROL-XL) 50 MG 24 hr tablet Take 50 mg by mouth daily. Take with or immediately following  a meal IN THE MORNING    . nitroGLYCERIN (NITROSTAT) 0.4 MG SL tablet Place 0.4 mg under the tongue every 5 (five) minutes as needed for chest pain.     Marland Kitchen PHENobarbital (LUMINAL) 64.8 MG tablet Take 2 tablets (129.6 mg  total) by mouth at bedtime. 60 tablet 1  . phenytoin (DILANTIN) 100 MG ER capsule Take 300 mg by mouth at bedtime.     . risperiDONE (RISPERDAL) 0.5 MG tablet Take 1 tablet (0.5 mg total) by mouth daily in the afternoon. 30 tablet 1  . rosuvastatin (CRESTOR) 40 MG tablet Take 40 mg by mouth at bedtime.     . sertraline (ZOLOFT) 50 MG tablet Take 50 mg by mouth every morning.    . terbinafine (LAMISIL) 250 MG tablet TAKE 1 TABLET BY MOUTH ONCE DAILY IN THE MORNING      Musculoskeletal: Strength & Muscle Tone: decreased Gait & Station: normal Patient leans: N/A  Psychiatric Specialty Exam: Physical Exam Vitals and nursing note reviewed.  Constitutional:      Appearance: Normal appearance.  HENT:     Head: Normocephalic.     Nose: Nose normal.  Pulmonary:     Effort: Pulmonary effort is normal.  Musculoskeletal:        General: Normal range of motion.     Cervical back: Normal range of motion.  Neurological:     General: No focal deficit present.     Mental Status: He is alert and oriented to person, place, and time.  Psychiatric:        Attention and Perception: Attention and perception normal.        Mood and Affect: Mood is anxious and depressed. Affect is blunt.        Speech: Speech normal.        Behavior: Behavior is slowed.        Thought Content: Thought content normal.        Cognition and Memory: Cognition is impaired. Memory is impaired.        Judgment: Judgment is impulsive.     Review of Systems  Psychiatric/Behavioral: Positive for agitation, behavioral problems, confusion and dysphoric mood. The patient is nervous/anxious.   All other systems reviewed and are negative.   Blood pressure (!) 155/73, pulse 75, temperature 97.7 F (36.5 C),  temperature source Oral, resp. rate 17, height 5\' 4"  (1.626 m), weight 77.1 kg, SpO2 99 %.Body mass index is 29.18 kg/m.  General Appearance: Casual  Eye Contact:  Minimal  Speech:  Normal Rate  Volume:  Decreased  Mood:  Anxious and Depressed  Affect:  Blunt  Thought Process:  Coherent and Descriptions of Associations: Intact  Orientation:  Full (Time, Place, and Person)  Thought Content:  WDL and Logical  Suicidal Thoughts:  No  Homicidal Thoughts:  No  Memory:  Immediate;   Fair Recent;   Fair Remote;   Fair  Judgement:  Impaired  Insight:  Fair  Psychomotor Activity:  Decreased  Concentration:  Concentration: Fair and Attention Span: Fair  Recall:  of Knowledge:  Fair  Language:  Good  Akathisia:  No  Handed:  Right  AIMS (if indicated):     Assets:  Housing Leisure Time Physical Health Resilience Social Support  ADL's:  Intact  Cognition:  Impaired,  Mild  Sleep:        Treatment Plan Summary: Daily contact with patient to assess and evaluate symptoms and progress in treatment, Medication management and Plan adjustment disorder with mixed disorder of emotions and conduct:  Sundowning syndrome: -Zyprexa 2.5 mg daily at 3 pm  Disposition: Recommend psychiatric Inpatient admission when medically cleared.  [employer], NP 01/05/2020 12:31 PM

## 2020-01-05 NOTE — ED Notes (Signed)
Hourly rounding reveals patient awake in room. No complaints, stable, in no acute distress. Q15 minute rounds and monitoring via Security Cameras to continue. 

## 2020-01-05 NOTE — ED Notes (Signed)
Report to include Situation, Background, Assessment, and Recommendations received from Jeannette RN. Patient alert, warm and dry, in no acute distress. Patient denies SI, HI, AVH and pain. Patient made aware of Q15 minute rounds and Rover and Officer presence for their safety. Patient instructed to come to me with needs or concerns.  

## 2020-01-05 NOTE — ED Notes (Signed)
Patient assited to bathroom, had a large bowel movement.

## 2020-01-05 NOTE — ED Notes (Signed)
Patient son to pick up at or about 1400 today. Patient assisted with dressing sitting chair having lunch.

## 2020-01-05 NOTE — Discharge Instructions (Addendum)
[facility] (Mental Health & Substance Use Services) & [facility]  Mental health service in [place], [place] Address: [address], [place], [place] [address] Hours:  Closed ? Opens 8AM Mon Phone: [phone] 

## 2020-01-05 NOTE — BH Assessment (Signed)
Late Entry Writer spoke with the patient to complete an updated/reassessment. Patient denies SI/HI and AV/H. He was able to share what brought him to the ER. He reports of living with "The big bad witch and her five kids." When asked if they have a different name, he says the daughter-n-law.   Writer and Psych NP [name]) spoke with patient's son [name]). Updated him about patient's behaviors while in the ER. Was able to develop a safety plan for discharge, agreed to PRN medications to address the agitation in the evening.  Writer spoke with patient's nurse and family is refusing to pick patient up due to "72 hour commitment."  Clinical research associate and Psych NP [name]) spoke with patient's daughter [name]) and son [name]), via phone. Attempted to explain again decision for patient to be discharged. Revisited the safety plan and PRN medications. Even though the son, whom the patient lives with, initially was in agreement with the plan, he no longer aggress and daughter expressed why she didn't want him to be discharged. She states the patient was not safe because he attempted to walk away from the house, and he doesn't listen to her. As well as she was promised he was going to be seen in the ER to determine whether or not he has dementia. Writer explained, she would need to follow up with his outpatient provider for that dx, because that's not done in the ER.   Current plan/disposition: Patient is to continue with current regiment of medications and continue to look for psych geriatric bed. In the event he continues to improve and remain stable and not placed he will discharge back into the care of the family.

## 2020-01-06 NOTE — BH Assessment (Addendum)
TTS reassessment completed. Pt presents calm, flat and oriented x 3.  Pt mumbled throughout the assessment but expressed concern with his current living situation. Pt reports no current issues and denies any current SI/HI/AH/VH. Pt is currently unable to contract or safety.   Per Nanine Means, NP pt continues to meet criteria for INPT

## 2020-01-06 NOTE — ED Notes (Signed)
Hourly rounding reveals patient sleeping in room. No complaints, stable, in no acute distress. Q15 minute rounds and monitoring via Rover and Officer to continue.  

## 2020-01-06 NOTE — ED Notes (Signed)
Pt given breakfast tray

## 2020-01-06 NOTE — ED Provider Notes (Signed)
Emergency Medicine Observation Re-evaluation Note  Edward Thompson is a 69 y.o. male, seen on rounds today.  Pt initially presented to the ED for complaints of IVC Currently, the patient has no complaints this morning.  Physical Exam  BP 116/64 (BP Location: Right Arm)   Pulse 86   Temp 98.1 F (36.7 C) (Oral)   Resp 16   Ht 5\' 4"  (1.626 m)   Wt 77.1 kg   SpO2 97%   BMI 29.18 kg/m  Physical Exam General: No apparent distress HEENT: moist mucous membranes CV: RRR Pulm: Normal WOB GI: soft and non tender MSK: no edema or cyanosis Neuro: face symmetric, moving all extremities    ED Course / MDM  EKG:    I have reviewed the labs performed to date as well as medications administered while in observation.  No acute changes overnight or new labs this morning  Plan  Current plan is for placement.    , Don Perking, MD 01/06/20 [phone]

## 2020-01-06 NOTE — ED Notes (Signed)
Hourly rounding reveals patient awake in room. No complaints, stable, in no acute distress. Q15 minute rounds and monitoring via Rover and Officer to continue.  

## 2020-01-06 NOTE — BH Assessment (Addendum)
Referral check:   Edward Thompson [phone]), Edward Thompson asked to re-send referral 3:30pm    Edward Thompson ([phone]) Edward Thompson ([facility] dept) reports admission dept. to have no intake worker; not accepting outside referrals due to COVID   Edward Thompson [phone] -or- [phone]) Edward Thompson reports denied due to memory loss    Edward Thompson [phone] or [phone]) No answer    Edward Thompson [phone]) Left a voicemail   Per Edward Means, NP after further assessment of pt, pt referral process is currently placed on hold to reassess placement needs.

## 2020-01-06 NOTE — ED Notes (Signed)
Pt given meal tray.
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
## 2020-01-06 NOTE — ED Notes (Signed)
This tech assisted pt. To the bathroom.

## 2020-01-06 NOTE — ED Notes (Signed)
Pt. Given snacks and a drink

## 2020-01-06 NOTE — Evaluation (Signed)
Physical Therapy Evaluation Patient Details Name: Edward Thompson MRN: [id number] DOB: 1950-07-08 Today's Date: 01/06/2020   History of Present Illness  Patient is a pleasant 69 year old male who presents for Sundown Syndrome.  Patient was attempting to walk to [facility] per previous notes and presents under involuntary commitment by [employer]. PMH includes CAD, carotid stenosis, DM, heart attack, hepatitis, HLD, HTN, MI, seizures, sleep apnea, syncope, vertico, viral meningitis, foot drop bilateral.  Patient currently lives with his son and spouse.    Clinical Impression  Patient is a pleasant 69 year old male who presents with generalized instability and limited mobility. Per previous documentation patient lives at home with his son and daughter in law and has been having increasing episodes of confusion. Patient is able to converse with this therapist when guided and follows simple and complex commands without delay. He reports he uses braces/AFOs at home to help with his bilateral foot drop and uses a walker at baseline. Patient is unable to transfer safely without use of a walker and experiences posterior LOB when not supported with BUE. Bilateral foot drop/slap with ambulation limits his stability and patient additionally requires Min A for AD guidance within room. Patient returned to sitting after ambulation and aide present informed of need for walker for future mobilization with patient. Patient will benefit from skilled physical therapy while in the hospital to increase strength, stability, and decrease fall risk. Pending patient placement patient will benefit from skilled physical therapy while in a geriatric psych unit or home health PT and 24/7 aide if d/c home with son.     Follow Up Recommendations Other (comment) (HHPT if d/c home, in house PT if geriatric pysch unit)    Equipment Recommendations  None recommended by PT (has RW at home per patient)     Recommendations for Other Services       Precautions / Restrictions Precautions Precautions: Fall Precaution Comments: needs walker and uses AFOs at home at baseline Restrictions Weight Bearing Restrictions: No      Mobility  Bed Mobility               General bed mobility comments: patient sitting EOB upon PT arrival  Transfers Overall transfer level: Needs assistance Equipment used: Rolling walker (2 wheeled) Transfers: Sit to/from Stand Sit to Stand: Min guard;Min assist         General transfer comment: Patient initially attempted STS transfer w/o AD and required Mod A with near LOB posterior. Second attempt with use of RW performed with Min A and no LOB.  Ambulation/Gait Ambulation/Gait assistance: Min guard;Min assist Gait Distance (Feet): 35 Feet Assistive device: Rolling walker (2 wheeled) Gait Pattern/deviations: Step-through pattern;Decreased dorsiflexion - left;Decreased dorsiflexion - right;Shuffle;Trunk flexed Gait velocity: decreased   General Gait Details: Patient demonstrates bilateral foot drop/slap with ambulation. requires Min A for AD guidance but ambulation itself is CGA.  Stairs            Wheelchair Mobility    Modified Rankin (Stroke Patients Only)       Balance Overall balance assessment: Needs assistance Sitting-balance support: No upper extremity supported;Feet supported Sitting balance-Leahy Scale: Good Sitting balance - Comments: able to reach inside/outside BOS w/o LOB   Standing balance support: Bilateral upper extremity supported;During functional activity Standing balance-Leahy Scale: Fair Standing balance comment: Patient requires use of RW for standing balance and ambulation. Has posterior LOB without UE support.  Pertinent Vitals/Pain Pain Assessment: No/denies pain    Home Living Family/patient expects to be discharged to:: Other (Comment) (Potential d/c to Geriatric  Psych) Living Arrangements: Children               Additional Comments: per patient and previous documentation patient lives with son and daughter in law; may potentially be discharged to geriatric psych unit.    Prior Function Level of Independence: Needs assistance   Gait / Transfers Assistance Needed: Per patient he uses a walker and AFOs for ambulation at home  ADL's / Homemaking Assistance Needed: son and son's wife help with homemaking  Comments: Per patient he ambulates with RW and biltaral AFO/braces for foot drop at baseline.     Hand Dominance   Dominant Hand: Right    Extremity/Trunk Assessment   Upper Extremity Assessment Upper Extremity Assessment: Generalized weakness    Lower Extremity Assessment Lower Extremity Assessment: RLE deficits/detail;LLE deficits/detail RLE Deficits / Details: hip and knee grossly 4/5 ankles 1/5 RLE Coordination: decreased gross motor LLE Deficits / Details: hip and knee grossly 4/5 ankles 1/5 LLE Coordination: decreased gross motor    Cervical / Trunk Assessment Cervical / Trunk Assessment: Normal  Communication   Communication: No difficulties  Cognition Arousal/Alertness: Awake/alert Behavior During Therapy: Flat affect Overall Cognitive Status: No family/caregiver present to determine baseline cognitive functioning                                 General Comments: Patient is alert and able to respond to simple and complex commands without delay. Is able to interact and converse however does not initiate conversation without prompting.      General Comments General comments (skin integrity, edema, etc.): bilateral foot edema    Exercises Other Exercises Other Exercises: Patient educated on role of PT in acute care setting, safe transfers and mobility with use of AD as well as reduction of fall risk.   Assessment/Plan    PT Assessment Patient needs continued PT services  PT Problem List Decreased  strength;Decreased activity tolerance;Decreased balance;Decreased mobility;Decreased coordination;Decreased safety awareness       PT Treatment Interventions DME instruction;Gait training;Stair training;Functional mobility training;Therapeutic activities;Therapeutic exercise;Manual techniques;Patient/family education;Neuromuscular re-education;Cognitive remediation;Balance training    PT Goals (Current goals can be found in the Care Plan section)  Acute Rehab PT Goals Patient Stated Goal: to get out of here PT Goal Formulation: With patient Time For Goal Achievement: 01/20/20 Potential to Achieve Goals: Fair    Frequency Min 2X/week   Barriers to discharge   would benefit from HHPT or PT in psych unit.    Co-evaluation               AM-PAC PT "6 Clicks" Mobility  Outcome Measure Help needed turning from your back to your side while in a flat bed without using bedrails?: None Help needed moving from lying on your back to sitting on the side of a flat bed without using bedrails?: A Little Help needed moving to and from a bed to a chair (including a wheelchair)?: A Little Help needed standing up from a chair using your arms (e.g., wheelchair or bedside chair)?: A Little Help needed to walk in hospital room?: A Little Help needed climbing 3-5 steps with a railing? : A Little 6 Click Score: 19    End of Session Equipment Utilized During Treatment: Gait belt Activity Tolerance: Patient tolerated treatment well Patient left: in  bed;with nursing/sitter in room Nurse Communication: Mobility status;Other (comment) (needs RW for ambulation) PT Visit Diagnosis: Unsteadiness on feet (R26.81);Other abnormalities of gait and mobility (R26.89);Muscle weakness (generalized) (M62.81);Difficulty in walking, not elsewhere classified (R26.2)    Time: 7867-6720 PT Time Calculation (min) (ACUTE ONLY): 12 min   Charges:   PT Evaluation $PT Eval Moderate Complexity: 1 Mod PT  Treatments $Gait Training: 8-22 mins       Precious Bard, PT, DPT   01/06/2020, 9:37 AM
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
## 2020-01-07 NOTE — ED Notes (Signed)
Hourly rounding reveals patient in room. No complaints, stable, in no acute distress. Q15 minute rounds and monitoring via Rover and Officer to continue.   

## 2020-01-07 NOTE — ED Notes (Signed)
RN called patient's daughter Ellie Spickler) to give an update.  [phone]

## 2020-01-07 NOTE — ED Notes (Addendum)
Refused Snack and beverage.  

## 2020-01-07 NOTE — ED Notes (Signed)
Patient refused his medication.

## 2020-01-07 NOTE — ED Notes (Signed)
Report to include Situation, Background, Assessment, and Recommendations received from Amy RN. Patient alert and disoriented to place and time. He is warm and dry, in no acute distress. Patient denies SI, HI, AVH and pain. Patient made aware of Q15 minute rounds and Psychologist, counselling presence for their safety. Patient instructed to come to me with needs or concerns.

## 2020-01-07 NOTE — ED Notes (Signed)
APS here to speak with patient.

## 2020-01-07 NOTE — TOC Initial Note (Signed)
Transition of Care [facility]) - Initial/Assessment Note    Patient Details  Name: Edward Thompson MRN: [id number] Date of Birth: 08/22/1950  Transition of Care [facility]) CM/SW Contact:    [place] Cellar, RN Phone Number: 01/07/2020, 2:53 PM  Clinical Narrative:                 Sherron Monday to Jimmie Molly @ APS confirmed this patient is being followed by Leeroy Cha SW. Roni states she will update Verlon Au on patient being cleared for discharge pending APS clearance.         Patient Goals and CMS Choice        Expected Discharge Plan and Services           Expected Discharge Date: 01/05/20                                    Prior Living Arrangements/Services                       Activities of Daily Living   ADL Screening (condition at time of admission) Patient's cognitive ability adequate to safely complete daily activities?: Yes Patient able to express need for assistance with ADLs?: Yes Independently performs ADLs?: Yes (appropriate for developmental age)  Permission Sought/Granted                  Emotional Assessment              Admission diagnosis:  IVC Patient Active Problem List   Diagnosis Date Noted  . Adjustment disorder with mixed disturbance of emotions and conduct 01/05/2020  . Closed fracture of second cervical vertebra (HCC) 04/19/2019  . CAD (coronary artery disease) 03/14/2018  . S/P carotid endarterectomy 09/09/2017  . Foot drop, bilateral 09/09/2017  . OSA on CPAP 08/25/2017  . Symptomatic carotid artery stenosis 08/19/2017  . Syncope 07/18/2017  . Carotid stenosis 07/18/2017  . Hyperlipidemia 07/18/2017  . Essential hypertension 07/18/2017  . Closed fracture of medial malleolus 03/15/2016  . Dizziness 02/05/2016  . S/P coronary artery stent placement 09/10/2013  . Status post percutaneous transluminal coronary angioplasty 09/10/2013  . History of non-ST elevation myocardial infarction (NSTEMI) 08/20/2013  . Old myocardial  infarction 08/20/2013  . Seizure disorder (HCC) 01/22/2013   PCP:  Sherron Monday, MD Pharmacy:   [facility] [address], [place] - [address] [place] [address] Phone: [phone] Fax: [phone]     Social Determinants of Health (SDOH) Interventions    Readmission Risk Interventions No flowsheet data found.

## 2020-01-07 NOTE — ED Notes (Signed)
Pt ambulated with this writer to bathroom.    *needs 2 person assist if possible

## 2020-01-07 NOTE — ED Provider Notes (Signed)
Emergency Medicine Observation Re-evaluation Note  Edward Thompson is a 69 y.o. male, seen on rounds today.  Pt initially presented to the ED for complaints of IVC Currently, the patient is resting comfortably; voices no complaints.  Physical Exam  BP 99/66 (BP Location: Left Arm)   Pulse 82   Temp 98.8 F (37.1 C) (Oral)   Resp 18   Ht 5\' 4"  (1.626 m)   Wt 77.1 kg   SpO2 97%   BMI 29.18 kg/m  Physical Exam General: Resting no acute distress Cardiac: No cyanosis Lungs: Equal rise and fall Psych: Unremarkable  ED Course / MDM  EKG:    I have reviewed the labs performed to date as well as medications administered while in observation.  Recent changes in the last 24 hours include no acute events overnight.  Plan  Current plan is for gero psychiatric disposition. Patient is not under full IVC at this time.   , MD 01/07/20 [phone]

## 2020-01-07 NOTE — ED Notes (Signed)
VOL  PENDING  PLACEMENT 

## 2020-01-08 NOTE — ED Notes (Signed)
Assumed care of patient from prior nurse reports patient was refusing to take his medications last night, also c/o of swelling to lower extremity and could not walk. As per nurse patient was 1 person assist to bathroom last night. Will monitor today. Patient awaiting placement.

## 2020-01-08 NOTE — ED Notes (Signed)
Hourly rounding reveals patient in room. No complaints, stable, in no acute distress. Q15 minute rounds and monitoring via Rover and Officer to continue.   

## 2020-01-08 NOTE — TOC Progression Note (Addendum)
Transition of Care [facility]) - Progression Note    Patient Details  Name: Edward Thompson MRN: [id number] Date of Birth: [birth year]  Transition of Care [facility]) CM/SW Contact  [place] Cellar, RN Phone Number: 01/08/2020, 1:34 PM  Clinical Narrative:    Received update from [facility] @ APS after meeting with patient. Verlon Au states she is working on contacting family to discuss discharge plan and expectations.  Confirmed with Leslie-DSS has no concern for imminent danger to patient. Verlon Au states daughter in law is willing to take patient back home once he has some medication for his anxiety. RN CM updated EDP.        Expected Discharge Plan and Services           Expected Discharge Date: 01/05/20                                     Social Determinants of Health (SDOH) Interventions    Readmission Risk Interventions No flowsheet data found.

## 2020-01-08 NOTE — ED Notes (Addendum)
Pt expressed his confusion about the miscommunication between staff and why he is still here.  PT requested something to eat, since he has not eaten anything today. Tech provided pt with sandwich tray and drink.   lw edt

## 2020-01-08 NOTE — ED Provider Notes (Signed)
Emergency Medicine Observation Re-evaluation Note  Edward Thompson is a 69 y.o. male, seen on rounds today.  Pt initially presented to the ED for complaints of IVC Currently, the patient is resting in bed, denies any complaints when woken.  Physical Exam  BP 128/74 (BP Location: Right Arm)   Pulse 77   Temp 98.3 F (36.8 C) (Oral)   Resp 20   Ht 5\' 4"  (1.626 m)   Wt 77.1 kg   SpO2 98%   BMI 29.18 kg/m  Physical Exam Constitutional: Resting comfortably. Eyes: Conjunctivae are normal. Head: Atraumatic. Nose: No congestion/rhinnorhea. Mouth/Throat: Mucous membranes are moist. Neck: Normal ROM Cardiovascular: No cyanosis noted. Respiratory: Normal respiratory effort. Gastrointestinal: Non-distended. Genitourinary: deferred Musculoskeletal: No lower extremity tenderness nor edema. Neurologic:  Normal speech and language. No gross focal neurologic deficits are appreciated. Skin:  Skin is warm, dry and intact. No rash noted.    ED Course / MDM  EKG:EKG Interpretation  Date/Time:  Friday January 04 2020 14:36:20 EDT Ventricular Rate:  64 PR Interval:  192 QRS Duration: 90 QT Interval:  424 QTC Calculation: 437 R Axis:   8 Text Interpretation: Normal sinus rhythm Normal ECG Confirmed by UNCONFIRMED, DOCTOR (01-12-1980), editor [id number], Tammy [phone]) on 01/07/2020 3:15:49 PM    I have reviewed the labs performed to date as well as medications administered while in observation.  Recent changes in the last 24 hours include none.  Plan  Current plan is for geriatric psych disposition. Patient is not under full IVC at this time.   01/09/2020, MD 01/08/20 [phone]

## 2020-01-08 NOTE — ED Notes (Signed)
Patient calling to speak to nurse " rep[ort he is ready to go home". It was explained to patient that every effort is being made by social worker to get him to a safe place to live. Patient states " one can only have but so much patience and mine is running out".

## 2020-01-09 MED ORDER — RISPERIDONE 0.5 MG PO TABS
0.5000 mg | ORAL_TABLET | Freq: Every day | ORAL | 0 refills | Status: DC
Start: 2020-01-09 — End: 2020-10-11
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
## 2020-01-09 NOTE — ED Provider Notes (Signed)
Physical therapy order placed, requested by transition of care team.  Current plan is for patient to discharge as planned at about 4 PM with family   Sharyn Creamer, MD 01/09/20 1514

## 2020-01-09 NOTE — ED Provider Notes (Signed)
Emergency Medicine Observation Re-evaluation Note  ALFONSO CARDEN is a 69 y.o. male, seen on rounds today.  Pt initially presented to the ED for complaints of IVC Currently, the patient is resting.  Physical Exam  BP 132/77 (BP Location: Right Arm)   Pulse 74   Temp 97.9 F (36.6 C) (Oral)   Resp 16   Ht 1.626 m (5\' 4" )   Wt 77.1 kg   SpO2 98%   BMI 29.18 kg/m  Physical Exam Gen:  No acute distress Resp:  Breathing easily and comfortably, no accessory muscle usage Neuro:  Moving all four extremities, no gross focal neuro deficits Psych:  Resting currently, calm and cooperative when awake  ED Course / MDM    I have reviewed the labs performed to date as well as medications administered while in observation.  Recent changes in the last 24 hours include no significant changes.  Plan  Current plan is for geriatric psychiatry placement. Patient is not under full IVC at this time.   , MD 01/09/20 [phone]

## 2020-01-09 NOTE — TOC Progression Note (Signed)
Transition of Care [facility]) - Progression Note    Patient Details  Name: Edward Thompson MRN: [id number] Date of Birth: 02/14/1951  Transition [facility] Care [facility]) CM/SW Contact  [place] Cellar, RN Phone Number: 01/09/2020, 10:56 AM  Clinical Narrative:    Sherron Monday to [facility], TTS requesting clarification patient psych cleared.   Discussed case with PT who did not recommend HHC or SNF. TOC will arrange for discharge back to his home with family.         Expected Discharge Plan and Services           Expected Discharge Date: 01/05/20                                     Social Determinants of Health (SDOH) Interventions    Readmission Risk Interventions No flowsheet data found.

## 2020-01-09 NOTE — TOC Progression Note (Addendum)
Transition of Care [facility]) - Progression Note    Patient Details  Name: Edward Thompson MRN: [id number] Date of Birth: 10-Apr-1951  Transition [facility] Care [facility]) CM/SW Contact  [place] Cellar, RN Phone Number: 01/09/2020, 12:44 PM  Clinical Narrative:    Spoke with patient at bedside. Updated patient had been psych cleared as well as APS gave no concerns for dc home. Discussed case with physical therapy and decision made for no needs at home as patient has walker and AFO braces at home. Confirmed with patient walker is available and in good working condition.   Spoke to daughter, Edward Thompson and updated of clearance for discharge. Mckinley requested TOC contact daughter in law and update as well. Family will pick patient up after work at [employer] today.   LVMM for Edward Thompson-DIL @ [phone] requesting callback to update on dc status and confirm patient is able to return to his home.         Expected Discharge Plan and Services           Expected Discharge Date: 01/05/20                                     Social Determinants of Health (SDOH) Interventions    Readmission Risk Interventions No flowsheet data found.

## 2020-01-09 NOTE — ED Provider Notes (Signed)
Case discussed with Darl Pikes, transition of care team.  Have coordinated with psychiatry and the patient is not under IVC, he has been cleared by psychiatry and case discussed today with case management to psychiatry Nanine Means  Transition of care team has discussed the case and APS is cleared him to return to his home.  Physical therapy advises that he did well and does not require physical therapy at home.  Current plan of care is to contact his family for patient to be discharged back to home   Sharyn Creamer, MD 01/09/20 1110

## 2020-01-09 NOTE — Progress Notes (Signed)
Physical Therapy Treatment Patient Details Name: KWAME RYLAND MRN: [id number] DOB: June 09, 1950 Today's Date: 01/09/2020    History of Present Illness Patient is a pleasant 69 year old male who presents for Sundown Syndrome.  Patient was attempting to walk to [facility] per previous notes and presents under involuntary commitment by [employer]. PMH includes CAD, carotid stenosis, DM, heart attack, hepatitis, HLD, HTN, MI, seizures, sleep apnea, syncope, vertico, viral meningitis, foot drop bilateral.  Patient currently lives with his son and spouse.    PT Comments    Pt alert, sitting EOB. Oriented to self, place, situation. Sit<> stand from EOB and from standard commode, from very low bed surface minA due to posterior LOB, from standard commode CGA, no LOB noted. He ambulated ~175ft with RW and CGA, occasional unsteadiness noted with retroambulation, no difficulty with turning RW. Pt also assisted with toileting needs, supervision, minA provided for LE dressing/changes for pt comfort. Pt returned to room, all needs in reach. The patient would benefit from further skilled PT intervention to continue to progress towards goals. Recommendation remains appropriate.      Follow Up Recommendations  Other (comment);Home health PT;Supervision for mobility/OOB     Equipment Recommendations  Other (comment) (pt has RW at home)    Recommendations for Other Services       Precautions / Restrictions Precautions Precautions: Fall Precaution Comments: needs walker and uses AFOs at home at baseline Restrictions Weight Bearing Restrictions: No    Mobility  Bed Mobility               General bed mobility comments: patient sitting EOB upon PT arrival  Transfers Overall transfer level: Needs assistance Equipment used: Rolling walker (2 wheeled) Transfers: Sit to/from Stand Sit to Stand: Min assist         General transfer comment: from very low bed, posterior  LOB noted minA to correct. No LOB noted from standard commode and use of RW/grab bar  Ambulation/Gait   Gait Distance (Feet): 115 Feet Assistive device: Rolling walker (2 wheeled) Gait Pattern/deviations: Step-through pattern;Decreased dorsiflexion - left;Decreased dorsiflexion - right;Shuffle;Trunk flexed Gait velocity: decreased   General Gait Details: Patient demonstrates bilateral foot drop/slap with ambulation.   Stairs             Wheelchair Mobility    Modified Rankin (Stroke Patients Only)       Balance Overall balance assessment: Needs assistance Sitting-balance support: No upper extremity supported;Feet supported Sitting balance-Leahy Scale: Good Sitting balance - Comments: able to reach inside/outside BOS w/o LOB   Standing balance support: Bilateral upper extremity supported;During functional activity Standing balance-Leahy Scale: Fair Standing balance comment: Patient requires use of RW for standing balance and ambulation. Has posterior LOB without UE support.                            Cognition Arousal/Alertness: Awake/alert Behavior During Therapy: Flat affect Overall Cognitive Status: No family/caregiver present to determine baseline cognitive functioning                                 General Comments: Patient is alert and able to respond to simple and complex commands without delay. Is able to interact and converse however does not initiate conversation without prompting.      Exercises Other Exercises Other Exercises: Pt assisted to bathroom, CGA. toileting supervision, minA for LE  dressing/diaper changed but may not have been required. Able to wash hands at sink with fair balance    General Comments        Pertinent Vitals/Pain Pain Assessment: No/denies pain    Home Living                      Prior Function            PT Goals (current goals can now be found in the care plan section) Progress  towards PT goals: Progressing toward goals    Frequency    Min 2X/week      PT Plan Current plan remains appropriate    Co-evaluation              AM-PAC PT "6 Clicks" Mobility   Outcome Measure  Help needed turning from your back to your side while in a flat bed without using bedrails?: None Help needed moving from lying on your back to sitting on the side of a flat bed without using bedrails?: A Little Help needed moving to and from a bed to a chair (including a wheelchair)?: A Little Help needed standing up from a chair using your arms (e.g., wheelchair or bedside chair)?: A Little Help needed to walk in hospital room?: A Little Help needed climbing 3-5 steps with a railing? : A Little 6 Click Score: 19    End of Session Equipment Utilized During Treatment: Gait belt Activity Tolerance: Patient tolerated treatment well Patient left: in bed Nurse Communication: Mobility status PT Visit Diagnosis: Unsteadiness on feet (R26.81);Other abnormalities of gait and mobility (R26.89);Muscle weakness (generalized) (M62.81);Difficulty in walking, not elsewhere classified (R26.2)     Time: 1030-1053 PT Time Calculation (min) (ACUTE ONLY): 23 min  Charges:  $Therapeutic Exercise: 23-37 mins                     Olga Coaster PT, DPT 12:39 PM,01/09/20

## 2020-01-09 NOTE — ED Notes (Addendum)
Pt BP 103/69, HR 94. Held metoprolol, Dr Roxan Hockey notified and in agreement to hold it

## 2020-01-09 NOTE — ED Notes (Addendum)
Pt discharging home. Discharge teaching and prescription reviewed with pt and he verbalized understanding. Pt signed paper discharge form. Escorted to lobby via wheelchair, stable and in NAD.

## 2020-01-09 NOTE — Consult Note (Signed)
Patient has been psychiatrically cleared since 8/29, awaited APS report prior to discharge.  APS cleared him to return home.  Continues to be psych cleared.  Nanine Means, PMHNP

## 2020-01-27 DIAGNOSIS — T7491XS Unspecified adult maltreatment, confirmed, sequela: Secondary | ICD-10-CM

## 2020-04-21 ENCOUNTER — Encounter (INDEPENDENT_AMBULATORY_CARE_PROVIDER_SITE_OTHER): Payer: Medicare Other

## 2020-04-21 ENCOUNTER — Ambulatory Visit (INDEPENDENT_AMBULATORY_CARE_PROVIDER_SITE_OTHER): Payer: Medicare Other | Admitting: Vascular Surgery

## 2020-10-08 ENCOUNTER — Emergency Department: Payer: Medicare HMO

## 2020-10-08 ENCOUNTER — Inpatient Hospital Stay: Payer: Medicare HMO

## 2020-10-08 ENCOUNTER — Observation Stay
Admission: EM | Admit: 2020-10-08 | Discharge: 2020-10-11 | Disposition: A | Discharge status: Home / Self Care | Payer: Medicare HMO | Attending: Internal Medicine | Admitting: Internal Medicine

## 2020-10-08 ENCOUNTER — Encounter: Payer: Self-pay | Admitting: Emergency Medicine

## 2020-10-08 ENCOUNTER — Other Ambulatory Visit: Payer: Self-pay

## 2020-10-08 DIAGNOSIS — K56609 Unspecified intestinal obstruction, unspecified as to partial versus complete obstruction: Secondary | ICD-10-CM | POA: Diagnosis not present

## 2020-10-08 DIAGNOSIS — D72829 Elevated white blood cell count, unspecified: Secondary | ICD-10-CM | POA: Insufficient documentation

## 2020-10-08 DIAGNOSIS — G40909 Epilepsy, unspecified, not intractable, without status epilepticus: Secondary | ICD-10-CM | POA: Diagnosis not present

## 2020-10-08 DIAGNOSIS — I2510 Atherosclerotic heart disease of native coronary artery without angina pectoris: Secondary | ICD-10-CM | POA: Diagnosis not present

## 2020-10-08 DIAGNOSIS — E785 Hyperlipidemia, unspecified: Secondary | ICD-10-CM | POA: Diagnosis not present

## 2020-10-08 DIAGNOSIS — K3189 Other diseases of stomach and duodenum: Secondary | ICD-10-CM | POA: Diagnosis not present

## 2020-10-08 DIAGNOSIS — N3289 Other specified disorders of bladder: Secondary | ICD-10-CM | POA: Diagnosis not present

## 2020-10-08 DIAGNOSIS — J986 Disorders of diaphragm: Secondary | ICD-10-CM | POA: Diagnosis not present

## 2020-10-08 DIAGNOSIS — E663 Overweight: Secondary | ICD-10-CM | POA: Diagnosis present

## 2020-10-08 DIAGNOSIS — K56699 Other intestinal obstruction unspecified as to partial versus complete obstruction: Secondary | ICD-10-CM | POA: Diagnosis not present

## 2020-10-08 DIAGNOSIS — Z79899 Other long term (current) drug therapy: Secondary | ICD-10-CM | POA: Diagnosis not present

## 2020-10-08 DIAGNOSIS — K5669 Other partial intestinal obstruction: Secondary | ICD-10-CM | POA: Diagnosis not present

## 2020-10-08 DIAGNOSIS — Z20822 Contact with and (suspected) exposure to covid-19: Secondary | ICD-10-CM | POA: Diagnosis not present

## 2020-10-08 DIAGNOSIS — I6522 Occlusion and stenosis of left carotid artery: Secondary | ICD-10-CM

## 2020-10-08 DIAGNOSIS — Z7982 Long term (current) use of aspirin: Secondary | ICD-10-CM | POA: Diagnosis not present

## 2020-10-08 DIAGNOSIS — R339 Retention of urine, unspecified: Secondary | ICD-10-CM | POA: Insufficient documentation

## 2020-10-08 DIAGNOSIS — R109 Unspecified abdominal pain: Secondary | ICD-10-CM | POA: Diagnosis not present

## 2020-10-08 DIAGNOSIS — I10 Essential (primary) hypertension: Secondary | ICD-10-CM | POA: Diagnosis not present

## 2020-10-08 DIAGNOSIS — E871 Hypo-osmolality and hyponatremia: Secondary | ICD-10-CM | POA: Insufficient documentation

## 2020-10-08 DIAGNOSIS — K6389 Other specified diseases of intestine: Secondary | ICD-10-CM | POA: Diagnosis not present

## 2020-10-08 DIAGNOSIS — Z743 Need for continuous supervision: Secondary | ICD-10-CM | POA: Diagnosis not present

## 2020-10-08 DIAGNOSIS — M8448XA Pathological fracture, other site, initial encounter for fracture: Secondary | ICD-10-CM | POA: Diagnosis not present

## 2020-10-08 DIAGNOSIS — I6529 Occlusion and stenosis of unspecified carotid artery: Secondary | ICD-10-CM | POA: Diagnosis present

## 2020-10-08 DIAGNOSIS — N200 Calculus of kidney: Secondary | ICD-10-CM | POA: Diagnosis not present

## 2020-10-08 DIAGNOSIS — E119 Type 2 diabetes mellitus without complications: Secondary | ICD-10-CM | POA: Insufficient documentation

## 2020-10-08 DIAGNOSIS — Z4682 Encounter for fitting and adjustment of non-vascular catheter: Secondary | ICD-10-CM | POA: Diagnosis not present

## 2020-10-08 DIAGNOSIS — R1084 Generalized abdominal pain: Secondary | ICD-10-CM | POA: Diagnosis not present

## 2020-10-08 DIAGNOSIS — I25118 Atherosclerotic heart disease of native coronary artery with other forms of angina pectoris: Secondary | ICD-10-CM

## 2020-10-08 LAB — GLUCOSE, CAPILLARY
Glucose-Capillary: 110 mg/dL — ABNORMAL HIGH (ref 70–99)
Glucose-Capillary: 108 mg/dL — ABNORMAL HIGH (ref 70–99)

## 2020-10-08 LAB — BASIC METABOLIC PANEL
Anion gap: 10 (ref 5–15)
Anion gap: 9 (ref 5–15)
BUN: 13 mg/dL (ref 8–23)
BUN: 11 mg/dL (ref 8–23)
CO2: 23 mmol/L (ref 22–32)
CO2: 20 mmol/L — ABNORMAL LOW (ref 22–32)
Calcium: 8.9 mg/dL (ref 8.9–10.3)
Calcium: 8.6 mg/dL — ABNORMAL LOW (ref 8.9–10.3)
Chloride: 91 mmol/L — ABNORMAL LOW (ref 98–111)
Chloride: 95 mmol/L — ABNORMAL LOW (ref 98–111)
Creatinine, Ser: 0.43 mg/dL — ABNORMAL LOW (ref 0.61–1.24)
Creatinine, Ser: 0.54 mg/dL — ABNORMAL LOW (ref 0.61–1.24)
GFR, Estimated: >60 mL/min (ref >60)
GFR, Estimated: >60 mL/min (ref >60)
Glucose, Bld: 131 mg/dL — ABNORMAL HIGH (ref 70–99)
Glucose, Bld: 114 mg/dL — ABNORMAL HIGH (ref 70–99)
Potassium: 4.3 mmol/L (ref 3.5–5.1)
Potassium: 4.4 mmol/L (ref 3.5–5.1)
Sodium: 124 mmol/L — ABNORMAL LOW (ref 135–145)
Sodium: 124 mmol/L — ABNORMAL LOW (ref 135–145)

## 2020-10-08 LAB — CBC WITH DIFFERENTIAL/PLATELET
Abs Immature Granulocytes: 0.11 10*3/uL — ABNORMAL HIGH (ref 0.00–0.07)
Basophils Absolute: 0 10*3/uL (ref 0.0–0.1)
Basophils Relative: 0 %
Eosinophils Absolute: 0 10*3/uL (ref 0.0–0.5)
Eosinophils Relative: 0 %
HCT: 38.9 % — ABNORMAL LOW (ref 39.0–52.0)
Hemoglobin: 13.5 g/dL (ref 13.0–17.0)
Immature Granulocytes: 1 %
Lymphocytes Relative: 8 %
Lymphs Abs: 1 10*3/uL (ref 0.7–4.0)
MCH: 31.8 pg (ref 26.0–34.0)
MCHC: 34.7 g/dL (ref 30.0–36.0)
MCV: 91.7 fL (ref 80.0–100.0)
Monocytes Absolute: 0.7 10*3/uL (ref 0.1–1.0)
Monocytes Relative: 5 %
Neutro Abs: 11.9 10*3/uL — ABNORMAL HIGH (ref 1.7–7.7)
Neutrophils Relative %: 86 %
Platelets: 306 10*3/uL (ref 150–400)
RBC: 4.24 MIL/uL (ref 4.22–5.81)
RDW: 13.2 % (ref 11.5–15.5)
WBC: 13.8 10*3/uL — ABNORMAL HIGH (ref 4.0–10.5)
nRBC: 0 % (ref 0.0–0.2)

## 2020-10-08 LAB — COMPREHENSIVE METABOLIC PANEL WITH GFR
ALT: 43 U/L (ref 0–44)
AST: 29 U/L (ref 15–41)
Albumin: 4.3 g/dL (ref 3.5–5.0)
Alkaline Phosphatase: 82 U/L (ref 38–126)
Anion gap: 11 (ref 5–15)
BUN: 12 mg/dL (ref 8–23)
CO2: 23 mmol/L (ref 22–32)
Calcium: 9.2 mg/dL (ref 8.9–10.3)
Chloride: 88 mmol/L — ABNORMAL LOW (ref 98–111)
Creatinine, Ser: 0.59 mg/dL — ABNORMAL LOW (ref 0.61–1.24)
GFR, Estimated: >60 mL/min
Glucose, Bld: 148 mg/dL — ABNORMAL HIGH (ref 70–99)
Potassium: 4.3 mmol/L (ref 3.5–5.1)
Sodium: 122 mmol/L — ABNORMAL LOW (ref 135–145)
Total Bilirubin: 0.8 mg/dL (ref 0.3–1.2)
Total Protein: 8 g/dL (ref 6.5–8.1)

## 2020-10-08 LAB — URINALYSIS, COMPLETE (UACMP) WITH MICROSCOPIC
Bacteria, UA: NONE SEEN
Bilirubin Urine: NEGATIVE
Glucose, UA: NEGATIVE mg/dL
Hgb urine dipstick: NEGATIVE
Ketones, ur: NEGATIVE mg/dL
Leukocytes,Ua: NEGATIVE
Nitrite: NEGATIVE
Protein, ur: NEGATIVE mg/dL
Specific Gravity, Urine: 1.011 (ref 1.005–1.030)
Squamous Epithelial / HPF: NONE SEEN (ref 0–5)
pH: 6 (ref 5.0–8.0)

## 2020-10-08 LAB — HIV ANTIBODY (ROUTINE TESTING W REFLEX): HIV Screen 4th Generation wRfx: NONREACTIVE

## 2020-10-08 LAB — APTT: aPTT: 33 seconds (ref 24–36)

## 2020-10-08 LAB — PROTIME-INR
INR: 1.2 (ref 0.8–1.2)
Prothrombin Time: 15.3 seconds — ABNORMAL HIGH (ref 11.4–15.2)

## 2020-10-08 LAB — PHENOBARBITAL LEVEL: Phenobarbital: 21.8 ug/mL (ref 15.0–30.0)

## 2020-10-08 LAB — OSMOLALITY, URINE: Osmolality, Ur: 296 mOsm/kg — ABNORMAL LOW (ref 300–900)

## 2020-10-08 LAB — RESP PANEL BY RT-PCR (FLU A&B, COVID) ARPGX2
Influenza A by PCR: NEGATIVE
Influenza B by PCR: NEGATIVE
SARS Coronavirus 2 by RT PCR: NEGATIVE

## 2020-10-08 LAB — BRAIN NATRIURETIC PEPTIDE: B Natriuretic Peptide: 36 pg/mL (ref 0.0–100.0)

## 2020-10-08 LAB — TROPONIN I (HIGH SENSITIVITY)
Troponin I (High Sensitivity): <2 ng/L (ref <18)
Troponin I (High Sensitivity): 2 ng/L (ref <18)

## 2020-10-08 LAB — SODIUM, URINE, RANDOM: Sodium, Ur: 18 mmol/L

## 2020-10-08 LAB — LIPASE, BLOOD: Lipase: 23 U/L (ref 11–51)

## 2020-10-08 LAB — OSMOLALITY: Osmolality: 264 mOsm/kg — ABNORMAL LOW (ref 275–295)

## 2020-10-08 LAB — CBG MONITORING, ED: Glucose-Capillary: 140 mg/dL — ABNORMAL HIGH (ref 70–99)

## 2020-10-08 MED ORDER — ONDANSETRON HCL 4 MG/2ML IJ SOLN
4.0000 mg | Freq: Once | INTRAMUSCULAR | Status: AC
  Administered 2020-10-08: 4 mg via INTRAVENOUS
  Filled 2020-10-08: qty 2

## 2020-10-08 MED ORDER — SODIUM CHLORIDE 0.9 % IV BOLUS
1000.0000 mL | Freq: Once | INTRAVENOUS | Status: AC
  Administered 2020-10-08: 1000 mL via INTRAVENOUS

## 2020-10-08 MED ORDER — INSULIN ASPART 100 UNIT/ML IJ SOLN: 0.0000 [IU] | Freq: Every day | INTRAMUSCULAR | Status: DC

## 2020-10-08 MED ORDER — MORPHINE SULFATE (PF) 2 MG/ML IV SOLN: 1.0000 mg | INTRAVENOUS | Status: DC | PRN

## 2020-10-08 MED ORDER — SODIUM CHLORIDE 0.9 % IV SOLN: INTRAVENOUS | Status: DC

## 2020-10-08 MED ORDER — NITROGLYCERIN 0.4 MG SL SUBL: 0.4000 mg | SUBLINGUAL_TABLET | SUBLINGUAL | Status: DC | PRN

## 2020-10-08 MED ORDER — ONDANSETRON HCL 4 MG/2ML IJ SOLN: 4.0000 mg | Freq: Three times a day (TID) | INTRAMUSCULAR | Status: DC | PRN

## 2020-10-08 MED ORDER — PHENYTOIN SODIUM 50 MG/ML IJ SOLN: 100.0000 mg | Freq: Three times a day (TID) | INTRAMUSCULAR | Status: DC

## 2020-10-08 MED ORDER — PHENOBARBITAL SODIUM 65 MG/ML IJ SOLN
130.0000 mg | Freq: Every day | INTRAMUSCULAR | Status: DC
  Administered 2020-10-08 – 2020-10-10 (×3): 130 mg via INTRAVENOUS
  Filled 2020-10-08: qty 2
  Filled 2020-10-08: qty 1
  Filled 2020-10-08 (×2): qty 2

## 2020-10-08 MED ORDER — SODIUM CHLORIDE 0.9 % IV SOLN
100.0000 mg | Freq: Three times a day (TID) | INTRAVENOUS | Status: DC
  Administered 2020-10-08 – 2020-10-11 (×8): 100 mg via INTRAVENOUS
  Filled 2020-10-08 (×12): qty 2

## 2020-10-08 MED ORDER — ACETAMINOPHEN 650 MG RE SUPP: 650.0000 mg | Freq: Four times a day (QID) | RECTAL | Status: DC | PRN

## 2020-10-08 MED ORDER — SODIUM CHLORIDE 0.9 % IV SOLN
100.0000 mg | Freq: Three times a day (TID) | INTRAVENOUS | Status: DC
  Filled 2020-10-08 (×2): qty 2

## 2020-10-08 MED ORDER — INSULIN ASPART 100 UNIT/ML IJ SOLN
0.0000 [IU] | Freq: Three times a day (TID) | INTRAMUSCULAR | Status: DC
  Administered 2020-10-08: 1 [IU] via SUBCUTANEOUS
  Administered 2020-10-09: 2 [IU] via SUBCUTANEOUS
  Administered 2020-10-10: 1 [IU] via SUBCUTANEOUS
  Administered 2020-10-11: 2 [IU] via SUBCUTANEOUS
  Filled 2020-10-08 (×4): qty 1

## 2020-10-08 MED ORDER — HYDRALAZINE HCL 20 MG/ML IJ SOLN: 5.0000 mg | INTRAMUSCULAR | Status: DC | PRN

## 2020-10-08 MED ORDER — LORAZEPAM 2 MG/ML IJ SOLN: 2.0000 mg | INTRAMUSCULAR | Status: DC | PRN

## 2020-10-08 MED ORDER — MORPHINE SULFATE (PF) 2 MG/ML IV SOLN
2.0000 mg | Freq: Once | INTRAVENOUS | Status: AC
  Administered 2020-10-08: 2 mg via INTRAVENOUS
  Filled 2020-10-08: qty 1

## 2020-10-08 MED ORDER — DIATRIZOATE MEGLUMINE & SODIUM 66-10 % PO SOLN
90.0000 mL | Freq: Once | ORAL | Status: AC
  Administered 2020-10-08: 90 mL via NASOGASTRIC

## 2020-10-08 MED ORDER — CHLORHEXIDINE GLUCONATE CLOTH 2 % EX PADS
6.0000 | MEDICATED_PAD | Freq: Every day | CUTANEOUS | Status: DC
  Administered 2020-10-08 – 2020-10-11 (×4): 6 via TOPICAL

## 2020-10-08 NOTE — ED Triage Notes (Addendum)
EMS brings pt in from home for c/o abd pain; reports pt with no BM x wk, then finally had one last night; N/V after drinking 4 [place]

## 2020-10-08 NOTE — Consult Note (Signed)
SURGICAL CONSULTATION NOTE   HISTORY OF PRESENT ILLNESS (HPI):  70 y.o. male presented to [facility] ED for evaluation of nausea and vomiting since 2 to 3 days ago. Patient reports he was unable to have a bowel movement in the last 3 to 4 days.  He did have a bowel movement yesterday.  He reports that he started having nausea and vomiting after drinking [facility].  Reports generalized abdominal discomfort but no severe pain.  No pain radiation.  He cannot identify any alleviating or aggravating factors.  Currently denies any nausea.  At the ED he was found with mild leukocytosis and multiple electrolyte disturbances.  CT scan of the abdomen shows small bowel distention with a possible transition point on the distal ileum.  There is no free air or free fluid.  I personally evaluated the images.  CT done without contrast due to contrast shortage.  Surgery is consulted by Dr. Clyde Lundborg in this context for evaluation and management of bowel obstruction.  PAST MEDICAL HISTORY (PMH):  Past Medical History:  Diagnosis Date  . CAD (coronary artery disease)   . Carotid stenosis, left 08/2017  . Diabetes mellitus without complication (HCC)   . Fracture of neck (HCC) 2008   fell off a roof. required halo x 4 months. also fractured alot of vertebrae  . Heart attack (HCC) 2015  . Heart disease   . Hepatitis    6th grade   . Hyperlipidemia   . Hypertension   . Myocardial infarction acute (HCC) 2015  . Seizures (HCC)    taking phenobarbitol and dilantin. LAST SEIZURE WAS 2016. well controlled on meds  . Sleep apnea    USES CPAP  . Syncope 2019  . Vertigo   . Viral meningitis      PAST SURGICAL HISTORY (PSH):  Past Surgical History:  Procedure Laterality Date  . CARDIAC SURGERY    . CORONARY ANGIOPLASTY     PATIENT UNAWARE OF THIS  . CORONARY ARTERY BYPASS GRAFT  2015  . ENDARTERECTOMY Left 08/19/2017   Procedure: ENDARTERECTOMY CAROTID;  Surgeon: Renford Dills, MD;  Location: [facility] ORS;   Service: Vascular;  Laterality: Left;  . HERNIA REPAIR Right 1985   inguinal     MEDICATIONS:  Prior to Admission medications   Medication Sig Start Date End Date Taking? Authorizing Provider  aspirin EC 81 MG tablet Take 81 mg by mouth at bedtime.    Yes [provider]  clopidogrel (PLAVIX) 75 MG tablet Take 75 mg by mouth at bedtime.  01/28/15  Yes [provider]  isosorbide mononitrate (IMDUR) 30 MG 24 hr tablet Take 30 mg by mouth at bedtime.  04/29/17  Yes [provider]  meclizine (ANTIVERT) 25 MG tablet Take 50 mg by mouth 2 (two) times daily as needed for dizziness.  05/27/14  Yes [provider]  metFORMIN (GLUCOPHAGE) 500 MG tablet Take 500 mg by mouth daily.  04/28/17  Yes [provider]  metoprolol succinate (TOPROL-XL) 50 MG 24 hr tablet Take 50 mg by mouth daily. Take with or immediately following a meal IN THE MORNING   Yes [provider]  Multiple Vitamin (MULTIVITAMIN) tablet Take 1 tablet by mouth daily.   Yes [provider]  nitroGLYCERIN (NITROSTAT) 0.4 MG SL tablet Place 0.4 mg under the tongue every 5 (five) minutes as needed for chest pain.  08/22/13  Yes [provider]  PHENobarbital (LUMINAL) 64.8 MG tablet Take 2 tablets (129.6 mg total) by mouth  at bedtime. 08/25/17  Yes Lorenso Quarry, NP  phenytoin (DILANTIN) 100 MG ER capsule Take 300 mg by mouth at bedtime.    Yes [provider]  rosuvastatin (CRESTOR) 40 MG tablet Take 40 mg by mouth at bedtime.    Yes [provider]  ezetimibe (ZETIA) 10 MG tablet Take 10 mg by mouth daily.  11/27/18   [provider]  risperiDONE (RISPERDAL) 0.5 MG tablet Take 1 tablet (0.5 mg total) by mouth daily in the afternoon. Patient not taking: No sig reported 01/09/20   Charm Rings, NP     ALLERGIES:  No Known Allergies   SOCIAL HISTORY:  Social History   Socioeconomic History  . Marital status: Widowed    Spouse name: Not  on file  . Number of children: Not on file  . Years of education: Not on file  . Highest education level: Not on file  Occupational History  . Not on file  Tobacco Use  . Smoking status: Never Smoker  . Smokeless tobacco: Never Used  Vaping Use  . Vaping Use: Never used  Substance and Sexual Activity  . Alcohol use: No  . Drug use: No  . Sexual activity: Not Currently  Other Topics Concern  . Not on file  Social History Narrative  . Not on file   Social Determinants of Health   Financial Resource Strain: Not on file  Food Insecurity: Not on file  Transportation Needs: Not on file  Physical Activity: Not on file  Stress: Not on file  Social Connections: Not on file  Intimate Partner Violence: Not on file      FAMILY HISTORY:  Family History  Problem Relation Age of Onset  . Heart disease Mother   . Heart attack Father      REVIEW OF SYSTEMS:  Constitutional: denies weight loss, fever, chills, or sweats  Eyes: denies any other vision changes, history of eye injury  ENT: denies sore throat, hearing problems  Respiratory: denies shortness of breath, wheezing  Cardiovascular: denies chest pain, palpitations  Gastrointestinal: positive abdominal pain, nausea and vomiting Genitourinary: denies burning with urination or urinary frequency Musculoskeletal: denies any other joint pains or cramps  Skin: denies any other rashes or skin discolorations  Neurological: denies any other headache, dizziness, weakness  Psychiatric: denies any other depression, anxiety   All other review of systems were negative   VITAL SIGNS:  Temp:  [97.4 F (36.3 C)-98.7 F (37.1 C)] 97.4 F (36.3 C) (06/01 0425) Pulse Rate:  [75-83] 78 (06/01 1500) Resp:  [14-18] 14 (06/01 1400) BP: (111-144)/(64-81) 111/66 (06/01 1500) SpO2:  [94 %-100 %] 96 % (06/01 1500) Weight:  [77.1 kg] 77.1 kg (06/01 0209)     Height: 5\' 4"  (162.6 cm) Weight: 77.1 kg BMI (Calculated): 29.17   INTAKE/OUTPUT:   This shift: Total I/O In: 1000 [IV Piggyback:1000] Out: 1200 [Urine:1200]  Last 2 shifts: @IOLAST2SHIFTS @   PHYSICAL EXAM:  Constitutional:  -- Normal body habitus  -- drowsy but arousable Eyes:  -- Pupils equally round and reactive to light  -- No scleral icterus  Ear, nose, and throat:  -- No jugular venous distension  Pulmonary:  -- No crackles  -- Equal breath sounds bilaterally -- Breathing non-labored at rest Cardiovascular:  -- S1, S2 present  -- No pericardial rubs Gastrointestinal:  -- Abdomen soft, nontender, distended, no guarding or rebound tenderness -- No abdominal masses appreciated, pulsatile or otherwise  Musculoskeletal and Integumentary:  -- Wounds: None appreciated --  Extremities: B/L UE and LE FROM, hands and feet warm, no edema  Neurologic:  -- Motor function: intact and symmetric -- Sensation: intact and symmetric   Labs:  CBC Latest Ref Rng & Units 10/08/2020 01/04/2020 12/08/2018  WBC 4.0 - 10.5 K/uL 13.8(H) 8.8 13.8(H)  Hemoglobin 13.0 - 17.0 g/dL 06.2 12.9(L) 11.8(L)  Hematocrit 39.0 - 52.0 % 38.9(L) 37.7(L) 35.1(L)  Platelets 150 - 400 K/uL 306 211 204   CMP Latest Ref Rng & Units 10/08/2020 10/08/2020 01/04/2020  Glucose 70 - 99 mg/dL 694(W) 546(E) 703(J)  BUN 8 - 23 mg/dL 13 12 15   Creatinine 0.61 - 1.24 mg/dL ) 0.09(F) 8.18(E)  Sodium 135 - 145 mmol/L 124(L) 122(L) 133(L)  Potassium 3.5 - 5.1 mmol/L 4.3 4.3 3.9  Chloride 98 - 111 mmol/L 91(L) 88(L) 97(L)  CO2 22 - 32 mmol/L 23 23 23   Calcium 8.9 - 10.3 mg/dL 8.9 9.2 9.3  Total Protein 6.5 - 8.1 g/dL - 8.0 7.5  Total Bilirubin 0.3 - 1.2 mg/dL - 0.8 0.8  Alkaline Phos 38 - 126 U/L - 82 87  AST 15 - 41 U/L - 29 24  ALT 0 - 44 U/L - 43 24    Imaging studies:  I personally evaluated the CT scan showing dilated small bowel.  There is severe constipation.  No free air or free fluid.  Assessment/Plan:  70 y.o. male with small bowel obstruction, complicated by pertinent comorbidities  includinghyperlipidemia, diabetes mellitus, viral meningitis, vertigo, syncope, OSA, seizure, CAD, stent placement, carotid artery stenosis (s/p of left endarterectomy).  Patient denies any significant abdominal pain.  Physical exam without any acute peritonitis signs.  The abdomen is distended but not tender.  Imaging consistent with small bowel obstruction.  NGT in place.  Patient receiving IV hydration.  Patient with multiple electrolyte disturbances.  I will continue to follow-up with vital signs, physical exam and Gastrografin challenge.  If he does not resolve I will discuss with patient and with family the possibility of needing surgery for bowel obstruction.  We will continue to follow closely.  Agree with current management.  Agree with holding Plavix and aspirin until surgical decision is made.   , MD

## 2020-10-08 NOTE — ED Notes (Signed)
Lab called and needs one more red tube per [facility] staff.

## 2020-10-08 NOTE — Consult Note (Signed)
MEDICATION RELATED CONSULT NOTE - INITIAL   Pharmacy Consult for IV substitution of po anticonvulsants Indication: NPO status for SBO  No Known Allergies  Patient Measurements: Height: 5\' 4"  (162.6 cm) Weight: 77.1 kg (170 lb) IBW/kg (Calculated) : 59.2  Vital Signs: Temp: 97.4 F (36.3 C) (06/01 0425) Temp Source: Oral (06/01 0425) BP: 112/66 (06/01 1000) Pulse Rate: 80 (06/01 1000)  Estimated Creatinine Clearance: 80.7 mL/min (A) (by C-G formula based on SCr of 0.59 mg/dL (L)).  Medical History: Past Medical History:  Diagnosis Date  . CAD (coronary artery disease)   . Carotid stenosis, left 08/2017  . Diabetes mellitus without complication (HCC)   . Fracture of neck (HCC) 2008   fell off a roof. required halo x 4 months. also fractured alot of vertebrae  . Heart attack (HCC) 2015  . Heart disease   . Hepatitis    6th grade   . Hyperlipidemia   . Hypertension   . Myocardial infarction acute (HCC) 2015  . Seizures (HCC)    taking phenobarbitol and dilantin. LAST SEIZURE WAS 2016. well controlled on meds  . Sleep apnea    USES CPAP  . Syncope 2019  . Vertigo   . Viral meningitis     Medications:  Home dose dilantin is 300mg  ER po at bedtime Home dose phenobarbital is 129.6mg  po at bedtime  - last doses 5/31 hs per med rec  Plan:  Will start IV phenobarbital 130mg  qhs and IV phenytoin 100mg  q8h Starting this evening at 2200 based on previous recorded doses  Will monitor pending labs and adjust doses if needed   , PharmD, BCPS Clinical Pharmacist 10/08/2020 10:59 AM

## 2020-10-08 NOTE — H&P (Signed)
History and Physical    Edward Thompson ZMO:[id number] DOB: 1950-12-04 DOA: 10/08/2020  Referring MD/NP/PA:   PCP: Sherron Monday, MD   Patient coming from:  The patient is coming from home.  At baseline, pt is independent for most of ADL.        Chief Complaint: Abdominal pain and constipation  HPI: Edward Thompson is a 70 y.o. male with medical history significant of hyperlipidemia, diabetes mellitus, viral meningitis, vertigo, syncope, OSA, seizure, CAD, stent placement, carotid artery stenosis (s/p of left endarterectomy), C-spine fracture, who presents with abdominal pain and constipation.  Patient states that he has been constipated for more than 1 week.  Finally he had 1 bowel movement last night.  He has abdominal pain, which is located middle abdomen, constant, moderate, sharp, nonradiating.  Patient has nausea and few times of nonbilious nonbloody vomiting.  No diarrhea.  No fever or chills.  Denies chest pain, cough, shortness breath, symptoms of UTI.  No unilateral weakness. Pt was found to have urine retention and Foley catheter is placed to ED.  ED Course: pt was found to have WBC 13.8, troponin negative x2, negative COVID PCR, negative urinalysis, sodium 122, potassium 4.3, renal function okay, temperature normal, blood pressure 144/81, heart rate 82, RR 18, oxygen saturation 100% on room air.  CT scan of abdomen/pelvis showed high-grade small bowel obstruction with a transition site to the mid abdomen.  Patient is admitted to MedSurg bed as inpatient.  Dr. Maia Plan of general surgery is consulted.  Review of Systems:   General: no fevers, chills, no body weight gain, has poor appetite, has fatigue HEENT: no blurry vision, hearing changes or sore throat Respiratory: no dyspnea, coughing, wheezing CV: no chest pain, no palpitations GI: has nausea, vomiting, abdominal pain, constipation GU: no dysuria, burning on urination, increased urinary frequency, hematuria  Ext: has leg  edema Neuro: no unilateral weakness, numbness, or tingling, no vision change or hearing loss Skin: no rash, no skin tear. MSK: No muscle spasm, no deformity, no limitation of range of movement in spin Heme: No easy bruising.  Travel history: No recent long distant travel.  Allergy: No Known Allergies  Past Medical History:  Diagnosis Date  . CAD (coronary artery disease)   . Carotid stenosis, left 08/2017  . Diabetes mellitus without complication (HCC)   . Fracture of neck (HCC) 2008   fell off a roof. required halo x 4 months. also fractured alot of vertebrae  . Heart attack (HCC) 2015  . Heart disease   . Hepatitis    6th grade   . Hyperlipidemia   . Hypertension   . Myocardial infarction acute (HCC) 2015  . Seizures (HCC)    taking phenobarbitol and dilantin. LAST SEIZURE WAS 2016. well controlled on meds  . Sleep apnea    USES CPAP  . Syncope 2019  . Vertigo   . Viral meningitis     Past Surgical History:  Procedure Laterality Date  . CARDIAC SURGERY    . CORONARY ANGIOPLASTY     PATIENT UNAWARE OF THIS  . CORONARY ARTERY BYPASS GRAFT  2015  . ENDARTERECTOMY Left 08/19/2017   Procedure: ENDARTERECTOMY CAROTID;  Surgeon: Renford Dills, MD;  Location: [facility] ORS;  Service: Vascular;  Laterality: Left;  . HERNIA REPAIR Right 1985   inguinal    Social History:  reports that he has never smoked. He has never used smokeless tobacco. He reports that he does not drink alcohol and does not  use drugs.  Family History:  Family History  Problem Relation Age of Onset  . Heart disease Mother   . Heart attack Father      Prior to Admission medications   Medication Sig Start Date End Date Taking? Authorizing Provider  aspirin EC 81 MG tablet Take 81 mg by mouth at bedtime.     [provider]  clopidogrel (PLAVIX) 75 MG tablet Take 75 mg by mouth at bedtime.  01/28/15   [provider]  ezetimibe (ZETIA) 10 MG tablet Take 10 mg by mouth daily.  11/27/18    [provider]  isosorbide mononitrate (IMDUR) 30 MG 24 hr tablet Take 30 mg by mouth at bedtime.  04/29/17   [provider]  meclizine (ANTIVERT) 25 MG tablet Take 50 mg by mouth 2 (two) times daily as needed for dizziness.  05/27/14   [provider]  metFORMIN (GLUCOPHAGE) 500 MG tablet Take 500 mg by mouth daily.  04/28/17   [provider]  metoprolol succinate (TOPROL-XL) 50 MG 24 hr tablet Take 50 mg by mouth daily. Take with or immediately following a meal IN THE MORNING    [provider]  nitroGLYCERIN (NITROSTAT) 0.4 MG SL tablet Place 0.4 mg under the tongue every 5 (five) minutes as needed for chest pain.  08/22/13   [provider]  PHENobarbital (LUMINAL) 64.8 MG tablet Take 2 tablets (129.6 mg total) by mouth at bedtime. 08/25/17   Lorenso Quarry, NP  phenytoin (DILANTIN) 100 MG ER capsule Take 300 mg by mouth at bedtime.     [provider]  risperiDONE (RISPERDAL) 0.5 MG tablet Take 1 tablet (0.5 mg total) by mouth daily in the afternoon. 01/09/20   Charm Rings, NP  rosuvastatin (CRESTOR) 40 MG tablet Take 40 mg by mouth at bedtime.     [provider]    Physical Exam: Vitals:   10/08/20 1000 10/08/20 1100 10/08/20 1130 10/08/20 1200  BP: 112/66 117/64 111/65 132/68  Pulse: 80 75 75 79  Resp: 16  16   Temp:      TempSrc:      SpO2: 95% 96% 96% 97%  Weight:      Height:       General: Not in acute distress HEENT:       Eyes: PERRL, EOMI, no scleral icterus.       ENT: No discharge from the ears and nose, no pharynx injection, no tonsillar enlargement.        Neck: No JVD, no bruit, no mass felt. Heme: No neck lymph node enlargement. Cardiac: S1/S2, RRR, No murmurs, No gallops or rubs. Respiratory: No rales, wheezing, rhonchi or rubs. GI: Distended, diffuse tenderness, no rebound pain, no organomegaly, BS present. GU: No hematuria Ext: 1+ pitting leg edema bilaterally. 1+DP/PT pulse  bilaterally. Musculoskeletal: No joint deformities, No joint redness or warmth, no limitation of ROM in spin. Skin: No rashes.  Neuro: Alert, oriented X3, cranial nerves II-XII grossly intact, moves all extremities normally.  Psych: Patient is not psychotic, no suicidal or hemocidal ideation.  Labs on Admission: I have personally reviewed following labs and imaging studies  CBC: Recent Labs  Lab 10/08/20 0220  WBC 13.8*  NEUTROABS 11.9*  HGB 13.5  HCT 38.9*  MCV 91.7  PLT 306   Basic Metabolic Panel: Recent Labs  Lab 10/08/20 0220 10/08/20 1032  NA 122* 124*  K 4.3 4.3  CL 88* 91*  CO2 23 23  GLUCOSE 148*  131*  BUN 12 13  CREATININE 0.59* 0.43*  CALCIUM 9.2 8.9   GFR: Estimated Creatinine Clearance: 80.7 mL/min (A) (by C-G formula based on SCr of 0.43 mg/dL (L)). Liver Function Tests: Recent Labs  Lab 10/08/20 0220  AST 29  ALT 43  ALKPHOS 82  BILITOT 0.8  PROT 8.0  ALBUMIN 4.3   Recent Labs  Lab 10/08/20 0220  LIPASE 23   No results for input(s): AMMONIA in the last 168 hours. Coagulation Profile: Recent Labs  Lab 10/08/20 1112  INR 1.2   Cardiac Enzymes: No results for input(s): CKTOTAL, CKMB, CKMBINDEX, TROPONINI in the last 168 hours. BNP (last 3 results) No results for input(s): PROBNP in the last 8760 hours. HbA1C: No results for input(s): HGBA1C in the last 72 hours. CBG: Recent Labs  Lab 10/08/20 1216  GLUCAP 140*   Lipid Profile: No results for input(s): CHOL, HDL, LDLCALC, TRIG, CHOLHDL, LDLDIRECT in the last 72 hours. Thyroid Function Tests: No results for input(s): TSH, T4TOTAL, FREET4, T3FREE, THYROIDAB in the last 72 hours. Anemia Panel: No results for input(s): VITAMINB12, FOLATE, FERRITIN, TIBC, IRON, RETICCTPCT in the last 72 hours. Urine analysis:    Component Value Date/Time   COLORURINE YELLOW (A) 10/08/2020 0814   APPEARANCEUR CLEAR (A) 10/08/2020 0814   LABSPEC 1.011 10/08/2020 0814   PHURINE 6.0 10/08/2020 0814    GLUCOSEU NEGATIVE 10/08/2020 0814   HGBUR NEGATIVE 10/08/2020 0814   BILIRUBINUR NEGATIVE 10/08/2020 0814   KETONESUR NEGATIVE 10/08/2020 0814   PROTEINUR NEGATIVE 10/08/2020 0814   NITRITE NEGATIVE 10/08/2020 0814   LEUKOCYTESUR NEGATIVE 10/08/2020 0814   Sepsis Labs: @LABRCNTIP (procalcitonin:4,lacticidven:4) ) Recent Results (from the past 240 hour(s))  Resp Panel by RT-PCR (Flu A&B, Covid) Nasopharyngeal Swab     Status: None   Collection Time: 10/08/20  7:20 AM   Specimen: Nasopharyngeal Swab; Nasopharyngeal(NP) swabs in vial transport medium  Result Value Ref Range Status   SARS Coronavirus 2 by RT PCR NEGATIVE NEGATIVE Final    Comment: (NOTE) SARS-CoV-2 target nucleic acids are NOT DETECTED.  The SARS-CoV-2 RNA is generally detectable in upper respiratory specimens during the acute phase of infection. The lowest concentration of SARS-CoV-2 viral copies this assay can detect is 138 copies/mL. A negative result does not preclude SARS-Cov-2 infection and should not be used as the sole basis for treatment or other patient management decisions. A negative result may occur with  improper specimen collection/handling, submission of specimen other than nasopharyngeal swab, presence of viral mutation(s) within the areas targeted by this assay, and inadequate number of viral copies(<138 copies/mL). A negative result must be combined with clinical observations, patient history, and epidemiological information. The expected result is Negative.  Fact Sheet for Patients:  BloggerCourse.com  Fact Sheet for Healthcare Providers:  SeriousBroker.it  This test is no t yet approved or cleared by the [place] FDA and  has been authorized for detection and/or diagnosis of SARS-CoV-2 by FDA under an Emergency Use Authorization (EUA). This EUA will remain  in effect (meaning this test can be used) for the duration of the COVID-19  declaration under Section 564(b)(1) of the Act, 21 U.S.C.section 360bbb-3(b)(1), unless the authorization is terminated  or revoked sooner.       Influenza A by PCR NEGATIVE NEGATIVE Final   Influenza B by PCR NEGATIVE NEGATIVE Final    Comment: (NOTE) The Xpert Xpress SARS-CoV-2/FLU/RSV plus assay is intended as an aid in the diagnosis of influenza from Nasopharyngeal swab specimens and should not be  used as a sole basis for treatment. Nasal washings and aspirates are unacceptable for Xpert Xpress SARS-CoV-2/FLU/RSV testing.  Fact Sheet for Patients: BloggerCourse.com  Fact Sheet for Healthcare Providers: SeriousBroker.it  This test is not yet approved or cleared by the [place] FDA and has been authorized for detection and/or diagnosis of SARS-CoV-2 by FDA under an Emergency Use Authorization (EUA). This EUA will remain in effect (meaning this test can be used) for the duration of the COVID-19 declaration under Section 564(b)(1) of the Act, 21 U.S.C. section 360bbb-3(b)(1), unless the authorization is terminated or revoked.  Performed at [facility], [address] [place], [place] [address]      Radiological Exams on Admission: CT Abdomen Pelvis Wo Contrast  Result Date: 10/08/2020 CLINICAL DATA:  70 year old male with abdominal distension, nausea vomiting. EXAM: CT ABDOMEN AND PELVIS WITHOUT CONTRAST TECHNIQUE: Multidetector CT imaging of the abdomen and pelvis was performed following the standard protocol without IV contrast. COMPARISON:  CT Abdomen and Pelvis 10/05/2004. FINDINGS: Lower chest: Calcified coronary artery atherosclerosis is evident on series 2, image 1. Cardiac size at the upper limits of normal. No pericardial effusion. Negative lung bases aside from mild atelectasis. Hepatobiliary: Negative noncontrast liver and partially contracted gallbladder. There is trace free fluid along the inferior  liver tip. Pancreas: Negative noncontrast pancreas. Spleen: Negative. Adrenals/Urinary Tract: Normal adrenal glands. Bilateral nephrolithiasis but no hydronephrosis or hydroureter. However, the urinary bladder is distended, 460 mL. Stomach/Bowel: Mild to moderate stool ball in the rectum. Retained stool in the sigmoid colon which is redundant and tracks into the right abdomen, and occupies most of the right gutter before crossing midline back to the left. Descending colon is decompressed and stool free. Redundant splenic flexure. Redundant transverse colon and hepatic flexure with mild to moderate retained stool. The cecum is on a lax mesentery located in the right upper abdomen below the liver. Appendix remains normal on coronal image 32. The terminal ileum an IC valve are on coronal image 37 and decompressed. Just upstream of the TI there is an abrupt transition point (coronal image 39 and series 2, image 38 from diffusely dilated proximal and mid small bowel loops to the decompressed terminal ileum. Only the ligament of Treitz and proximal jejunum are nondilated, and the abnormal gas and fluid distended small bowel loops measure up to 40 mm diameter. The stomach is also moderately distended with air and fluid. There is no free air. There is trace free fluid in the small bowel mesentery. Vascular/Lymphatic: Aortoiliac calcified atherosclerosis. Normal caliber abdominal aorta. Vascular patency is not evaluated in the absence of IV contrast. Reproductive: Negative. Other: No pelvic free fluid. Musculoskeletal: Chronic bilateral posterior and left lateral lower rib fractures are new since 2006. Mild L2 superior endplate compression on the left appears to be chronic. There is a degree of osteopenia. No acute osseous abnormality identified. IMPRESSION: 1. High-grade very distal Small Bowel Obstruction, with abrupt transition point just upstream of the TI on series 2, image 38 and coronal image 39. This transition point  is in the mid abdomen, the cecum is on a lax mesentery located in the right upper quadrant. Trace free fluid. No free air. 2. Distended urinary bladder (460 mL), consider Foley catheter placement. Bilateral nephrolithiasis without obstructive uropathy. 3. Stool ball in the rectum. 4. Chronic lower rib fractures are new since 2006. 5. Aortic Atherosclerosis (ICD10-I70.0). Electronically Signed   By: Odessa Fleming M.D.   On: 10/08/2020 06:42   DG Abd Acute W/Chest  Result Date: 10/08/2020 CLINICAL DATA:  Abdominal pain.  Nausea vomiting. EXAM: DG ABDOMEN ACUTE WITH 1 VIEW CHEST COMPARISON:  Chest x-ray 12/08/2018.  CT 10/05/2004. FINDINGS: Mediastinum hilar structures normal. Mild bibasilar subsegmental atelectasis and or scarring. No pleural effusion or pneumothorax. Old left rib fractures again noted. Old proximal right humeral fracture noted. Prominently dilated loops of small bowel are noted. Small bowel dilatation up to 5.1 cm noted. Paucity of intra colonic air noted. Stool noted throughout the colon. Findings consistent with small bowel obstruction. Gastric distention noted. No definite free air. IMPRESSION: Findings consistent with small-bowel obstruction. CT of the abdomen and pelvis should be considered for further evaluation. Electronically Signed   By: Maisie Fus  Register   On: 10/08/2020 05:35   DG Abd Portable 1 View  Result Date: 10/08/2020 CLINICAL DATA:  Nasogastric tube placement EXAM: PORTABLE ABDOMEN - 1 VIEW COMPARISON:  CT abdomen and pelvis October 08, 2020 FINDINGS: Nasogastric tube tip is in the proximal stomach with the side port at the gastroesophageal junction. Multiple loops of dilated bowel consistent with bowel obstruction again noted. No appreciable free air on semierect image. Distended stomach on the left abuts the left hemidiaphragm, unchanged from earlier in the day. Several old healed rib fractures noted in the left lung region. IMPRESSION: Nasogastric tube tip in proximal stomach with  side port at gastroesophageal junction. Advise advancing nasogastric tube 5-6 cm. Loops of dilated bowel consistent with bowel obstruction demonstrated on recent CT again noted. No free air appreciable on single image. Electronically Signed   By: Bretta Bang III M.D.   On: 10/08/2020 08:41     EKG: I have personally reviewed.  Sinus rhythm, QTC 439, LAD, poor R wave progression.   Assessment/Plan Principal Problem:   SBO (small bowel obstruction) (HCC) Active Problems:   Carotid stenosis   Hyperlipidemia   Essential hypertension   CAD (coronary artery disease)   Seizure disorder (HCC)   Hyponatremia   Leukocytosis   Diabetes mellitus without complication (HCC)   Urinary retention   SBO (small bowel obstruction) (HCC): CT scan of abdomen/pelvis showed high-grade small bowel obstruction with a transition site to the mid abdomen. Dr. Hazle Quant of surgery is consulted.  -admit to med-surg bed as inpt -NG tube is placed -prn morphine for pain and Zofran for nausea -IV fluid: 1 L normal saline, followed by 75 cc/h -hold all home meds due to NG tube placement  Carotid stenosis: S/p of left endarterectomy  -Hold aspirin and Plavix in case patient needs surgery  Hyperlipidemia -Hold home Zetia and Crestor  Essential hypertension -IV hydralazine as needed -Hold metoprolol  CAD (coronary artery disease): -Hold aspirin, Plavix, metoprolol, imdur -As needed nitroglycerin  Seizure disorder (HCC) -Seizure precaution -As needed Ativan for seizure -Switch oral luminal and phenytoin to IV per pharmacist consult  Diabetes mellitus without complication: A1c 5.5 recently, well controlled.  Patient taking metformin -Sliding scale insulin  Leukocytosis: WBC 13.8.  No source of infection identified.  Likely reactive - Follow-up with CBC  Urinary retention -Foley cath is placed if  Hyponatremia: Sodium 122.  Likely due to decreased oral intake and GI loss secondary to nausea  vomiting. - Will check urine sodium, urine osmolality, serum osmolality. - IVF: 1L NS in ED, will continue with IV normal saline at 75 mL/h - f/u by BMP q8h - avoid over correction too fast due to risk of central pontine myelinolysis    DVT ppx: SCD Code Status: Full code Family Communication: Yes, patient's  daughter by phone Disposition Plan:  Anticipate discharge back to previous environment Consults called: Dr. Maia Planintron of surgery Admission status and Level of care: Med-Surg:    for obs     Status is: Inpatient  Remains inpatient appropriate because:Inpatient level of care appropriate due to severity of illness   Dispo: The patient is from: Home              Anticipated d/c is to: Home              Patient currently is not medically stable to d/c.   Difficult to place patient No          Date of Service 10/08/2020    Lorretta HarpXilin Kerrick Miler [facility]   If 7PM-7AM, please contact night-coverage [email or web address] 10/08/2020, 12:52 PM

## 2020-10-08 NOTE — ED Notes (Signed)
Called pharmacy concerned IV push dilantin versus IVPB dilantin. They stated they would contact pharmacist working over the ER today.

## 2020-10-08 NOTE — ED Notes (Signed)
Advanced NG tube from 55 to 60cm. Clamped NG tube at this time d/t xray protocol. Will administer contrast in 1 hr.
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
## 2020-10-08 NOTE — ED Notes (Signed)
Surgeon at bedside.  

## 2020-10-09 ENCOUNTER — Inpatient Hospital Stay: Payer: Medicare HMO

## 2020-10-09 DIAGNOSIS — E871 Hypo-osmolality and hyponatremia: Secondary | ICD-10-CM | POA: Diagnosis not present

## 2020-10-09 DIAGNOSIS — I10 Essential (primary) hypertension: Secondary | ICD-10-CM | POA: Diagnosis not present

## 2020-10-09 DIAGNOSIS — K56609 Unspecified intestinal obstruction, unspecified as to partial versus complete obstruction: Secondary | ICD-10-CM | POA: Diagnosis not present

## 2020-10-09 DIAGNOSIS — G40909 Epilepsy, unspecified, not intractable, without status epilepticus: Secondary | ICD-10-CM | POA: Diagnosis not present

## 2020-10-09 DIAGNOSIS — R339 Retention of urine, unspecified: Secondary | ICD-10-CM | POA: Diagnosis not present

## 2020-10-09 DIAGNOSIS — K5669 Other partial intestinal obstruction: Secondary | ICD-10-CM | POA: Diagnosis not present

## 2020-10-09 DIAGNOSIS — Z4682 Encounter for fitting and adjustment of non-vascular catheter: Secondary | ICD-10-CM | POA: Diagnosis not present

## 2020-10-09 DIAGNOSIS — G4733 Obstructive sleep apnea (adult) (pediatric): Secondary | ICD-10-CM | POA: Diagnosis not present

## 2020-10-09 DIAGNOSIS — M6281 Muscle weakness (generalized): Secondary | ICD-10-CM | POA: Diagnosis not present

## 2020-10-09 DIAGNOSIS — R338 Other retention of urine: Secondary | ICD-10-CM | POA: Diagnosis not present

## 2020-10-09 DIAGNOSIS — E663 Overweight: Secondary | ICD-10-CM | POA: Diagnosis not present

## 2020-10-09 DIAGNOSIS — K6389 Other specified diseases of intestine: Secondary | ICD-10-CM | POA: Diagnosis not present

## 2020-10-09 DIAGNOSIS — E119 Type 2 diabetes mellitus without complications: Secondary | ICD-10-CM | POA: Diagnosis not present

## 2020-10-09 LAB — CBC
HCT: 37.4 % — ABNORMAL LOW (ref 39.0–52.0)
Hemoglobin: 13 g/dL (ref 13.0–17.0)
MCH: 31.3 pg (ref 26.0–34.0)
MCHC: 34.8 g/dL (ref 30.0–36.0)
MCV: 90.1 fL (ref 80.0–100.0)
Platelets: 233 10*3/uL (ref 150–400)
RBC: 4.15 MIL/uL — ABNORMAL LOW (ref 4.22–5.81)
RDW: 13.2 % (ref 11.5–15.5)
WBC: 8.3 10*3/uL (ref 4.0–10.5)
nRBC: 0 % (ref 0.0–0.2)

## 2020-10-09 LAB — BASIC METABOLIC PANEL
Anion gap: 10 (ref 5–15)
Anion gap: 9 (ref 5–15)
BUN: 13 mg/dL (ref 8–23)
BUN: 11 mg/dL (ref 8–23)
CO2: 23 mmol/L (ref 22–32)
CO2: 22 mmol/L (ref 22–32)
Calcium: 8.5 mg/dL — ABNORMAL LOW (ref 8.9–10.3)
Calcium: 8.1 mg/dL — ABNORMAL LOW (ref 8.9–10.3)
Chloride: 97 mmol/L — ABNORMAL LOW (ref 98–111)
Chloride: 99 mmol/L (ref 98–111)
Creatinine, Ser: 0.54 mg/dL — ABNORMAL LOW (ref 0.61–1.24)
Creatinine, Ser: 0.56 mg/dL — ABNORMAL LOW (ref 0.61–1.24)
GFR, Estimated: >60 mL/min (ref >60)
GFR, Estimated: >60 mL/min (ref >60)
Glucose, Bld: 120 mg/dL — ABNORMAL HIGH (ref 70–99)
Glucose, Bld: 100 mg/dL — ABNORMAL HIGH (ref 70–99)
Potassium: 4.1 mmol/L (ref 3.5–5.1)
Potassium: 3.4 mmol/L — ABNORMAL LOW (ref 3.5–5.1)
Sodium: 130 mmol/L — ABNORMAL LOW (ref 135–145)
Sodium: 130 mmol/L — ABNORMAL LOW (ref 135–145)

## 2020-10-09 LAB — PHENYTOIN LEVEL, FREE AND TOTAL
Phenytoin, Free: 1.3 ug/mL (ref 1.0–2.0)
Phenytoin, Total: 14.1 ug/mL (ref 10.0–20.0)

## 2020-10-09 LAB — GLUCOSE, CAPILLARY
Glucose-Capillary: 91 mg/dL (ref 70–99)
Glucose-Capillary: 94 mg/dL (ref 70–99)
Glucose-Capillary: 138 mg/dL — ABNORMAL HIGH (ref 70–99)
Glucose-Capillary: 88 mg/dL (ref 70–99)

## 2020-10-09 MED ORDER — POLYETHYLENE GLYCOL 3350 17 G PO PACK
17.0000 g | PACK | Freq: Every day | ORAL | Status: DC
  Administered 2020-10-09 – 2020-10-11 (×3): 17 g via ORAL
  Filled 2020-10-09 (×3): qty 1

## 2020-10-09 NOTE — Progress Notes (Signed)
Patient ID: Edward Thompson, male   DOB: [birth year], 70 y.o.   MRN: [id number]     SURGICAL PROGRESS NOTE   Hospital Day(s): 1.   Post op day(s):  Marland Kitchen   Interval History: Patient seen and examined, no acute events or new complaints overnight. Patient reports had multiple bowel movement last night.  He denies abdominal pain.  No pain radiation.  No alleviating or aggravating factors.  Patient denies any fever or chills.  Patient denies any nausea or vomiting.  Vital signs in last 24 hours: [min-max] current  Temp:  [98.3 F (36.8 C)-99.3 F (37.4 C)] 99.2 F (37.3 C) (06/02 0700) Pulse Rate:  [75-101] 98 (06/02 0700) Resp:  [14-18] 18 (06/02 0700) BP: (111-153)/(59-81) 126/64 (06/02 0700) SpO2:  [94 %-100 %] 94 % (06/02 0700)     Height: 5\' 4"  (162.6 cm) Weight: 77.1 kg BMI (Calculated): 29.17   Physical Exam:  Constitutional: alert, cooperative and no distress  Respiratory: breathing non-labored at rest  Cardiovascular: regular rate and sinus rhythm  Gastrointestinal: soft, non-tender, and non-distended  Labs:  CBC Latest Ref Rng & Units 10/09/2020 10/08/2020 01/04/2020  WBC 4.0 - 10.5 K/uL 8.3 13.8(H) 8.8  Hemoglobin 13.0 - 17.0 g/dL 01/06/2020 05.3 12.9(L)  Hematocrit 39.0 - 52.0 % 37.4(L) 38.9(L) 37.7(L)  Platelets 150 - 400 K/uL 233 306 211   CMP Latest Ref Rng & Units 10/09/2020 10/08/2020 10/08/2020  Glucose 70 - 99 mg/dL 12/08/2020) 734(L) 937(T)  BUN 8 - 23 mg/dL 13 11 13   Creatinine 0.61 - 1.24 mg/dL 024(O) ) 9.73(Z)  Sodium 135 - 145 mmol/L 130(L) 124(L) 124(L)  Potassium 3.5 - 5.1 mmol/L 4.1 4.4 4.3  Chloride 98 - 111 mmol/L 97(L) 95(L) 91(L)  CO2 22 - 32 mmol/L 23 20(L) 23  Calcium 8.9 - 10.3 mg/dL 3.29(J) 2.42(A) 8.9  Total Protein 6.5 - 8.1 g/dL - - -  Total Bilirubin 0.3 - 1.2 mg/dL - - -  Alkaline Phos 38 - 126 U/L - - -  AST 15 - 41 U/L - - -  ALT 0 - 44 U/L - - -    Imaging studies: I evaluated the abdominal x-ray 1 view.  Small decreasing small bowel dilation.  Unable to  assess how far the contrast reached.   Assessment/Plan:  70 y.o. male with small bowel obstruction, complicated by pertinent comorbidities includinghyperlipidemia, diabetes mellitus, viral meningitis, vertigo, syncope, OSA, seizure, CAD, stent placement, carotid artery stenosis (s/p of left endarterectomy).   Patient seems to be improving clinically.  Patient poor historian and unable to get reliable history from him.  I will order a 2 view abdominal x-ray to have some imaging support of the improvement of the bowel dilation and obstruction.  If there is any improvement and patient continue having bowel movement or passing gas I will consider starting clear liquids later today.  No family at bedside.  I will continue to follow closely with physical exam and imaging.  Vital signs been stable.  No acute abdomen.  No need of emergent surgery at this moment.  We will follow-up closely.  1.9(Q, MD

## 2020-10-09 NOTE — Progress Notes (Signed)
PROGRESS NOTE  Edward Thompson YDX:[id number] DOB: 1950-10-08 DOA: 10/08/2020 PCP: Sherron Monday, MD  HPI/Recap of past 28 hours: 70 year old male with past medical history of obstructive sleep apnea, diabetes mellitus, CAD admitted on 6/1 for 1 week of abdominal pain and constipation and found to have a small bowel obstruction high-grade.  Admitted to the hospitalist service and general surgery consulted.  By following day, follow-up abdominal x-ray notes decreasing small bowel dilatation.  2 view x-ray notes contrast now in colon. Probable obstruction still present.  Patient complains of some abdominal pain although says it is doing better.  No flatus.  Assessment/Plan: Principal Problem:   SBO (small bowel obstruction) (HCC): Clinically improving.  At this time, no need for surgery.  Continue n.p.o. and monitor closely.  Follow-up x-rays tomorrow morning Active Problems:   Carotid stenosis   Hyperlipidemia   Essential hypertension: Blood pressure stable.  Holding oral medications, as needed only   CAD (coronary artery disease)   Seizure disorder (HCC): Put on IV medications in place of p.o.  Luminal and Dilantin.   Hyponatremia: Mild.  Improved with fluids, likely second dehydration.  130 today.   Leukocytosis   Diabetes mellitus without complication (HCC): Sliding scale only.  A1c notes good control.   Urinary retention   Overweight (BMI 25.0-29.9): Patient meets criteria for BMI greater than 25.   Code Status: None  Family Communication: Left message for family  Disposition Plan: Discharge hopefully next few days once obstruction resolved   Consultants:  General surgery  Procedures:  None  Antimicrobials:  None  DVT prophylaxis: SCDs  Level of care: Med-Surg   Objective: Vitals:   10/09/20 0700 10/09/20 1100  BP: 126/64 129/71  Pulse: 98 (!) 103  Resp: 18 18  Temp: 99.2 F (37.3 C) 99.1 F (37.3 C)  SpO2: 94% 95%    Intake/Output Summary (Last  24 hours) at 10/09/2020 1324 Last data filed at 10/09/2020 0447 Gross per 24 hour  Intake --  Output 1825 ml  Net -1825 ml   Filed Weights   10/08/20 0209  Weight: 77.1 kg   Body mass index is 29.18 kg/m.  Exam:   General: Alert and oriented x2, no acute distress  HEENT: Normocephalic atraumatic, mucous membranes slightly dry  Cardiovascular: Regular rate and rhythm, S1-S2  Respiratory: Clear to auscultation bilaterally  Abdomen: Soft, minimal distention, nonspecific generalized tenderness, absent bowel sounds  Musculoskeletal: No clubbing or cyanosis or edema  Skin: No skin breaks, tears or lesions  Psychiatry: Appropriate, no evidence of psychosis  Neurology: No focal deficits   Data Reviewed: CBC: Recent Labs  Lab 10/08/20 0220 10/09/20 0109  WBC 13.8* 8.3  NEUTROABS 11.9*  --   HGB 13.5 13.0  HCT 38.9* 37.4*  MCV 91.7 90.1  PLT 306 233   Basic Metabolic Panel: Recent Labs  Lab 10/08/20 0220 10/08/20 1032 10/08/20 1641 10/09/20 0109 10/09/20 0835  NA 122* 124* 124* 130* 130*  K 4.3 4.3 4.4 4.1 3.4*  CL 88* 91* 95* 97* 99  CO2 23 23 20* 23 22  GLUCOSE 148* 131* 114* 120* 100*  BUN 12 13 11 13 11   CREATININE 0.59* 0.43* 0.54* 0.54* 0.56*  CALCIUM 9.2 8.9 8.6* 8.5* 8.1*   GFR: Estimated Creatinine Clearance: 80.7 mL/min (A) (by C-G formula based on SCr of 0.56 mg/dL (L)). Liver Function Tests: Recent Labs  Lab 10/08/20 0220  AST 29  ALT 43  ALKPHOS 82  BILITOT 0.8  PROT 8.0  ALBUMIN 4.3   Recent Labs  Lab 10/08/20 0220  LIPASE 23   No results for input(s): AMMONIA in the last 168 hours. Coagulation Profile: Recent Labs  Lab 10/08/20 1112  INR 1.2   Cardiac Enzymes: No results for input(s): CKTOTAL, CKMB, CKMBINDEX, TROPONINI in the last 168 hours. BNP (last 3 results) No results for input(s): PROBNP in the last 8760 hours. HbA1C: No results for input(s): HGBA1C in the last 72 hours. CBG: Recent Labs  Lab 10/08/20 1216  10/08/20 1750 10/08/20 1956 10/09/20 0750 10/09/20 1143  GLUCAP 140* 110* 108* 91 94   Lipid Profile: No results for input(s): CHOL, HDL, LDLCALC, TRIG, CHOLHDL, LDLDIRECT in the last 72 hours. Thyroid Function Tests: No results for input(s): TSH, T4TOTAL, FREET4, T3FREE, THYROIDAB in the last 72 hours. Anemia Panel: No results for input(s): VITAMINB12, FOLATE, FERRITIN, TIBC, IRON, RETICCTPCT in the last 72 hours. Urine analysis:    Component Value Date/Time   COLORURINE YELLOW (A) 10/08/2020 0814   APPEARANCEUR CLEAR (A) 10/08/2020 0814   LABSPEC 1.011 10/08/2020 0814   PHURINE 6.0 10/08/2020 0814   GLUCOSEU NEGATIVE 10/08/2020 0814   HGBUR NEGATIVE 10/08/2020 0814   BILIRUBINUR NEGATIVE 10/08/2020 0814   KETONESUR NEGATIVE 10/08/2020 0814   PROTEINUR NEGATIVE 10/08/2020 0814   NITRITE NEGATIVE 10/08/2020 0814   LEUKOCYTESUR NEGATIVE 10/08/2020 0814   Sepsis Labs: @LABRCNTIP (procalcitonin:4,lacticidven:4)  ) Recent Results (from the past 240 hour(s))  Resp Panel by RT-PCR (Flu A&B, Covid) Nasopharyngeal Swab     Status: None   Collection Time: 10/08/20  7:20 AM   Specimen: Nasopharyngeal Swab; Nasopharyngeal(NP) swabs in vial transport medium  Result Value Ref Range Status   SARS Coronavirus 2 by RT PCR NEGATIVE NEGATIVE Final    Comment: (NOTE) SARS-CoV-2 target nucleic acids are NOT DETECTED.  The SARS-CoV-2 RNA is generally detectable in upper respiratory specimens during the acute phase of infection. The lowest concentration of SARS-CoV-2 viral copies this assay can detect is 138 copies/mL. A negative result does not preclude SARS-Cov-2 infection and should not be used as the sole basis for treatment or other patient management decisions. A negative result may occur with  improper specimen collection/handling, submission of specimen other than nasopharyngeal swab, presence of viral mutation(s) within the areas targeted by this assay, and inadequate number of  viral copies(<138 copies/mL). A negative result must be combined with clinical observations, patient history, and epidemiological information. The expected result is Negative.  Fact Sheet for Patients:  12/08/20  Fact Sheet for Healthcare Providers:  BloggerCourse.com  This test is no t yet approved or cleared by the [employer] FDA and  has been authorized for detection and/or diagnosis of SARS-CoV-2 by FDA under an Emergency Use Authorization (EUA). This EUA will remain  in effect (meaning this test can be used) for the duration of the COVID-19 declaration under Section 564(b)(1) of the Act, 21 U.S.C.section 360bbb-3(b)(1), unless the authorization is terminated  or revoked sooner.       Influenza A by PCR NEGATIVE NEGATIVE Final   Influenza B by PCR NEGATIVE NEGATIVE Final    Comment: (NOTE) The Xpert Xpress SARS-CoV-2/FLU/RSV plus assay is intended as an aid in the diagnosis of influenza from Nasopharyngeal swab specimens and should not be used as a sole basis for treatment. Nasal washings and aspirates are unacceptable for Xpert Xpress SARS-CoV-2/FLU/RSV testing.  Fact Sheet for Patients: [place]  Fact Sheet for Healthcare Providers: BloggerCourse.com  This test is not yet approved or cleared by the [employer].[place]  FDA and has been authorized for detection and/or diagnosis of SARS-CoV-2 by FDA under an Emergency Use Authorization (EUA). This EUA will remain in effect (meaning this test can be used) for the duration of the COVID-19 declaration under Section 564(b)(1) of the Act, 21 U.S.C. section 360bbb-3(b)(1), unless the authorization is terminated or revoked.  Performed at [facility], [address] [place], [place] [address]       Studies: DG Abd 2 Views  Result Date: 10/09/2020 CLINICAL DATA:  History of a small-bowel obstruction.  EXAM: ABDOMEN - 2 VIEW COMPARISON:  CT abdomen and pelvis and single view of the abdomen 10/08/2020. FINDINGS: NG tube has been removed. Oral contrast is now seen in the patient's colon and a few distal loops of small bowel. There is mild gaseous distention of small bowel of 4.2 cm. No free intraperitoneal air is identified. Atelectasis in the left lung base is noted. IMPRESSION: Contrast is now seen in the colon but there is mild gaseous distention of small bowel compatible with partial small bowel obstruction. Electronically Signed   By: Drusilla Kanner M.D.   On: 10/09/2020 12:13   DG Abd Portable 1V-Small Bowel Obstruction Protocol-initial, 8 hr delay  Result Date: 10/08/2020 CLINICAL DATA:  Small-bowel obstruction 8 hour delay EXAM: PORTABLE ABDOMEN - 1 VIEW COMPARISON:  10/08/2020 FINDINGS: Contrast within the stomach. Dilute contrast within dilated small bowel, overall degree of small-bowel distention is slightly decreased. Questionable contrast within right colon. Dense stool within the rectum. IMPRESSION: Mostly dilated contrast within dilated small bowel consistent with obstruction though degree of bowel distension appears slightly decreased. Questionable contrast within the right colon. Electronically Signed   By: Jasmine Pang M.D.   On: 10/08/2020 23:00    Scheduled Meds: . Chlorhexidine Gluconate Cloth  6 each Topical Daily  . insulin aspart  0-5 Units Subcutaneous QHS  . insulin aspart  0-9 Units Subcutaneous TID WC  . PHENObarbital  130 mg Intravenous QHS  . polyethylene glycol  17 g Oral Daily    Continuous Infusions: . sodium chloride 75 mL/hr at 10/09/20 0526  . phenytoin (DILANTIN) IV 100 mg (10/09/20 0528)     LOS: 1 day     Hollice Espy, MD [facility]   10/09/2020, 1:25 PM
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
## 2020-10-10 DIAGNOSIS — I10 Essential (primary) hypertension: Secondary | ICD-10-CM | POA: Diagnosis not present

## 2020-10-10 DIAGNOSIS — R339 Retention of urine, unspecified: Secondary | ICD-10-CM | POA: Diagnosis not present

## 2020-10-10 DIAGNOSIS — G40909 Epilepsy, unspecified, not intractable, without status epilepticus: Secondary | ICD-10-CM | POA: Diagnosis not present

## 2020-10-10 DIAGNOSIS — E119 Type 2 diabetes mellitus without complications: Secondary | ICD-10-CM | POA: Diagnosis not present

## 2020-10-10 DIAGNOSIS — E663 Overweight: Secondary | ICD-10-CM | POA: Diagnosis not present

## 2020-10-10 DIAGNOSIS — K5669 Other partial intestinal obstruction: Secondary | ICD-10-CM | POA: Diagnosis not present

## 2020-10-10 DIAGNOSIS — E871 Hypo-osmolality and hyponatremia: Secondary | ICD-10-CM | POA: Diagnosis not present

## 2020-10-10 DIAGNOSIS — R338 Other retention of urine: Secondary | ICD-10-CM

## 2020-10-10 DIAGNOSIS — K56609 Unspecified intestinal obstruction, unspecified as to partial versus complete obstruction: Secondary | ICD-10-CM | POA: Diagnosis not present

## 2020-10-10 LAB — GLUCOSE, CAPILLARY
Glucose-Capillary: 95 mg/dL (ref 70–99)
Glucose-Capillary: 112 mg/dL — ABNORMAL HIGH (ref 70–99)
Glucose-Capillary: 138 mg/dL — ABNORMAL HIGH (ref 70–99)
Glucose-Capillary: 117 mg/dL — ABNORMAL HIGH (ref 70–99)

## 2020-10-10 MED ORDER — POLYETHYLENE GLYCOL 3350 17 G PO PACK: 17.0000 g | PACK | Freq: Every day | ORAL | 0 refills | Status: DC

## 2020-10-10 NOTE — Care Management Obs Status (Signed)
MEDICARE OBSERVATION STATUS NOTIFICATION   Patient Details  Name: Edward Thompson MRN: [id number] Date of Birth: Jan 24, 1951   Medicare Observation Status Notification Given:  Yes    Allayne Butcher, RN 10/10/2020, 3:04 PM

## 2020-10-10 NOTE — Progress Notes (Signed)
Patient ID: Edward Thompson, male   DOB: 15-Nov-1950, 70 y.o.   MRN: [id number]     SURGICAL PROGRESS NOTE   Hospital Day(s): 2.   Post op day(s):  [place] Kitchen   Interval History: Patient seen and examined, no acute events or new complaints overnight. Patient reports feeling well.  He denies abdominal pain.  He reports he had 2 more bowel movement last night.  Patient denies any nausea or vomiting.  He reports tolerating full liquids.  He denies abdominal pain.  No pain radiation.  No alleviating or aggravating factors.  Vital signs in last 24 hours: [min-max] current  Temp:  [98.3 F (36.8 C)-99.1 F (37.3 C)] 98.5 F (36.9 C) (06/03 0429) Pulse Rate:  [69-103] 80 (06/03 0429) Resp:  [16-18] 16 (06/03 0429) BP: (104-130)/(49-71) 130/67 (06/03 0429) SpO2:  [95 %-100 %] 99 % (06/03 0429)     Height: 5\' 4"  (162.6 cm) Weight: 77.1 kg BMI (Calculated): 29.17   Physical Exam:  Constitutional: alert, cooperative and no distress  Respiratory: breathing non-labored at rest  Cardiovascular: regular rate and sinus rhythm  Gastrointestinal: soft, non-tender, and non-distended  Labs:  CBC Latest Ref Rng & Units 10/09/2020 10/08/2020 01/04/2020  WBC 4.0 - 10.5 K/uL 8.3 13.8(H) 8.8  Hemoglobin 13.0 - 17.0 g/dL 01/06/2020 74.9 12.9(L)  Hematocrit 39.0 - 52.0 % 37.4(L) 38.9(L) 37.7(L)  Platelets 150 - 400 K/uL 233 306 211   CMP Latest Ref Rng & Units 10/09/2020 10/09/2020 10/08/2020  Glucose 70 - 99 mg/dL 12/08/2020) 675(F) 163(W)  BUN 8 - 23 mg/dL 11 13 11   Creatinine 0.61 - 1.24 mg/dL 466(Z) ) 9.93(T)  Sodium 135 - 145 mmol/L 130(L) 130(L) 124(L)  Potassium 3.5 - 5.1 mmol/L 3.4(L) 4.1 4.4  Chloride 98 - 111 mmol/L 99 97(L) 95(L)  CO2 22 - 32 mmol/L 22 23 20(L)  Calcium 8.9 - 10.3 mg/dL 8.1(L) 8.5(L) 8.6(L)  Total Protein 6.5 - 8.1 g/dL - - -  Total Bilirubin 0.3 - 1.2 mg/dL - - -  Alkaline Phos 38 - 126 U/L - - -  AST 15 - 41 U/L - - -  ALT 0 - 44 U/L - - -    Imaging studies: I personally evaluated the  abdominal x-ray 2 views yesterday.  There is contrast in the rectum.  Improved small bowel dilation.   Assessment/Plan:  69 y.o.malewith small bowel obstruction, complicated by pertinent comorbidities includinghyperlipidemia, diabetes mellitus, viral meningitis, vertigo, syncope, OSA, seizure, CAD, stent placement, carotid artery stenosis (s/p of left endarterectomy).  Patient with clinically resolved SBO.  Patient with stable vital signs.  Abdomen is soft and depressible without distention.  No tenderness on palpation.  Patient had 2 more bowel movement last night.  Diet advanced to soft diet.  If patient tolerates of diet can be discharged from the small bowel obstruction standpoint, if medically stable.  No further imaging needed.  As per CT scan patient with severe constipation.  He should be discharged with treatment for constipation such as MiraLAX daily.  No surgical follow-up needed as outpatient since this episode of bowel obstruction resolved without any intervention.  7.01(X, MD

## 2020-10-10 NOTE — Progress Notes (Signed)
PROGRESS NOTE  Edward Thompson KPT:[id number] DOB: 04-13-1951 DOA: 10/08/2020 PCP: Sherron Monday, MD  HPI/Recap of past 35 hours: 70 year old male with past medical history of obstructive sleep apnea, diabetes mellitus, CAD admitted on 6/1 for 1 week of abdominal pain and constipation and found to have a small bowel obstruction high-grade.  Admitted to the hospitalist service and general surgery consulted.  By following day, follow-up abdominal x-ray notes decreasing small bowel dilatation.  2 view x-ray notes contrast now in colon.  Today, patient has had several bowel movements.  Tolerating soft diet.  Obstruction felt to be resolved.  He had issues with urinary retention on admission leading to Foley catheter placement.  Catheter removed and patient attempted to void and with some straining had minimal urine output by end of day.  Assessment/Plan: Principal Problem:   SBO (small bowel obstruction) (HCC): Now resolved.  Patient will be discharged on p.o. MiraLAX daily Active Problems:   Carotid stenosis   Hyperlipidemia   Essential hypertension: Blood pressure stable.  Holding oral medications, as needed only    CAD (coronary artery disease)   Seizure disorder (HCC): Put on IV medications in place of p.o.  Luminal and Dilantin.    Hyponatremia: Mild.  Improved with fluids, likely second dehydration.  130 on 6/2.  Recheck in morning.   Leukocytosis   Diabetes mellitus without complication (HCC): Sliding scale only.  A1c notes good control.   Urinary retention: We will leave Foley catheter out.  Recheck in the morning and decide whether he needs Foley catheter replaced.    Overweight (BMI 25.0-29.9): Patient meets criteria for BMI greater than 25.   Code Status: None  Family Communication: Updated daughter by phone  Disposition Plan: Plan to discharge tomorrow and hopefully urinary retention has resolved   Consultants:  General  surgery  Procedures:  None  Antimicrobials:  None  DVT prophylaxis: SCDs  Level of care: Med-Surg   Objective: Vitals:   10/10/20 1157 10/10/20 1623  BP: 112/65 123/61  Pulse: 82 88  Resp: 16 18  Temp: 98.6 F (37 C) 99.5 F (37.5 C)  SpO2: 97% 97%    Intake/Output Summary (Last 24 hours) at 10/10/2020 1834 Last data filed at 10/10/2020 1800 Gross per 24 hour  Intake 812.52 ml  Output 3357 ml  Net -2544.48 ml   Filed Weights   10/08/20 0209  Weight: 77.1 kg   Body mass index is 29.18 kg/m.  Exam:   General: Alert and oriented x2, no acute distress  HEENT: Normocephalic atraumatic, mucous membranes slightly dry  Cardiovascular: Regular rate and rhythm, S1-S2  Respiratory: Clear to auscultation bilaterally  Abdomen: Soft, minimal distention, nonspecific, nontender, positive bowel sounds  Musculoskeletal: No clubbing or cyanosis or edema  Skin: No skin breaks, tears or lesions  Psychiatry: Appropriate, no evidence of psychosis  Neurology: No focal deficits   Data Reviewed: CBC: Recent Labs  Lab 10/08/20 0220 10/09/20 0109  WBC 13.8* 8.3  NEUTROABS 11.9*  --   HGB 13.5 13.0  HCT 38.9* 37.4*  MCV 91.7 90.1  PLT 306 233   Basic Metabolic Panel: Recent Labs  Lab 10/08/20 0220 10/08/20 1032 10/08/20 1641 10/09/20 0109 10/09/20 0835  NA 122* 124* 124* 130* 130*  K 4.3 4.3 4.4 4.1 3.4*  CL 88* 91* 95* 97* 99  CO2 23 23 20* 23 22  GLUCOSE 148* 131* 114* 120* 100*  BUN 12 13 11 13 11   CREATININE 0.59* 0.43* 0.54* 0.54* 0.56*  CALCIUM 9.2 8.9 8.6* 8.5* 8.1*   GFR: Estimated Creatinine Clearance: 80.7 mL/min (A) (by C-G formula based on SCr of 0.56 mg/dL (L)). Liver Function Tests: Recent Labs  Lab 10/08/20 0220  AST 29  ALT 43  ALKPHOS 82  BILITOT 0.8  PROT 8.0  ALBUMIN 4.3   Recent Labs  Lab 10/08/20 0220  LIPASE 23   No results for input(s): AMMONIA in the last 168 hours. Coagulation Profile: Recent Labs  Lab  10/08/20 1112  INR 1.2   Cardiac Enzymes: No results for input(s): CKTOTAL, CKMB, CKMBINDEX, TROPONINI in the last 168 hours. BNP (last 3 results) No results for input(s): PROBNP in the last 8760 hours. HbA1C: No results for input(s): HGBA1C in the last 72 hours. CBG: Recent Labs  Lab 10/09/20 1617 10/09/20 2058 10/10/20 0812 10/10/20 1158 10/10/20 1623  GLUCAP 138* 88 95 112* 138*   Lipid Profile: No results for input(s): CHOL, HDL, LDLCALC, TRIG, CHOLHDL, LDLDIRECT in the last 72 hours. Thyroid Function Tests: No results for input(s): TSH, T4TOTAL, FREET4, T3FREE, THYROIDAB in the last 72 hours. Anemia Panel: No results for input(s): VITAMINB12, FOLATE, FERRITIN, TIBC, IRON, RETICCTPCT in the last 72 hours. Urine analysis:    Component Value Date/Time   COLORURINE YELLOW (A) 10/08/2020 0814   APPEARANCEUR CLEAR (A) 10/08/2020 0814   LABSPEC 1.011 10/08/2020 0814   PHURINE 6.0 10/08/2020 0814   GLUCOSEU NEGATIVE 10/08/2020 0814   HGBUR NEGATIVE 10/08/2020 0814   BILIRUBINUR NEGATIVE 10/08/2020 0814   KETONESUR NEGATIVE 10/08/2020 0814   PROTEINUR NEGATIVE 10/08/2020 0814   NITRITE NEGATIVE 10/08/2020 0814   LEUKOCYTESUR NEGATIVE 10/08/2020 0814   Sepsis Labs: @LABRCNTIP (procalcitonin:4,lacticidven:4)  ) Recent Results (from the past 240 hour(s))  Resp Panel by RT-PCR (Flu A&B, Covid) Nasopharyngeal Swab     Status: None   Collection Time: 10/08/20  7:20 AM   Specimen: Nasopharyngeal Swab; Nasopharyngeal(NP) swabs in vial transport medium  Result Value Ref Range Status   SARS Coronavirus 2 by RT PCR NEGATIVE NEGATIVE Final    Comment: (NOTE) SARS-CoV-2 target nucleic acids are NOT DETECTED.  The SARS-CoV-2 RNA is generally detectable in upper respiratory specimens during the acute phase of infection. The lowest concentration of SARS-CoV-2 viral copies this assay can detect is 138 copies/mL. A negative result does not preclude SARS-Cov-2 infection and should  not be used as the sole basis for treatment or other patient management decisions. A negative result may occur with  improper specimen collection/handling, submission of specimen other than nasopharyngeal swab, presence of viral mutation(s) within the areas targeted by this assay, and inadequate number of viral copies(<138 copies/mL). A negative result must be combined with clinical observations, patient history, and epidemiological information. The expected result is Negative.  Fact Sheet for Patients:  12/08/20  Fact Sheet for Healthcare Providers:  BloggerCourse.com  This test is no t yet approved or cleared by the [place] FDA and  has been authorized for detection and/or diagnosis of SARS-CoV-2 by FDA under an Emergency Use Authorization (EUA). This EUA will remain  in effect (meaning this test can be used) for the duration of the COVID-19 declaration under Section 564(b)(1) of the Act, 21 U.S.C.section 360bbb-3(b)(1), unless the authorization is terminated  or revoked sooner.       Influenza A by PCR NEGATIVE NEGATIVE Final   Influenza B by PCR NEGATIVE NEGATIVE Final    Comment: (NOTE) The Xpert Xpress SARS-CoV-2/FLU/RSV plus assay is intended as an aid in the diagnosis of influenza from Nasopharyngeal  swab specimens and should not be used as a sole basis for treatment. Nasal washings and aspirates are unacceptable for Xpert Xpress SARS-CoV-2/FLU/RSV testing.  Fact Sheet for Patients: BloggerCourse.com  Fact Sheet for Healthcare Providers: SeriousBroker.it  This test is not yet approved or cleared by the [place] FDA and has been authorized for detection and/or diagnosis of SARS-CoV-2 by FDA under an Emergency Use Authorization (EUA). This EUA will remain in effect (meaning this test can be used) for the duration of the COVID-19 declaration under  Section 564(b)(1) of the Act, 21 U.S.C. section 360bbb-3(b)(1), unless the authorization is terminated or revoked.  Performed at [facility], [address], [place], [place] [address]       Studies: No results found.  Scheduled Meds: . Chlorhexidine Gluconate Cloth  6 each Topical Daily  . insulin aspart  0-5 Units Subcutaneous QHS  . insulin aspart  0-9 Units Subcutaneous TID WC  . PHENObarbital  130 mg Intravenous QHS  . polyethylene glycol  17 g Oral Daily    Continuous Infusions: . sodium chloride 75 mL/hr at 10/10/20 1132  . phenytoin (DILANTIN) IV 100 mg (10/10/20 1132)     LOS: 2 days     Hollice Espy, MD [facility]   10/10/2020, 6:34 PM

## 2020-10-10 NOTE — Care Management Important Message (Signed)
Important Message  Patient Details  Name: Edward Thompson MRN: [id number] Date of Birth: [birth year]   Medicare Important Message Given:  Yes     [name] A [name] 10/10/2020, 10:16 AM

## 2020-10-10 NOTE — Care Management CC44 (Signed)
Condition Code 44 Documentation Completed  Patient Details  Name: Edward Thompson MRN: [id number] Date of Birth: 11/12/1950   Condition Code 44 given:  Yes Patient signature on Condition Code 44 notice:  Yes Documentation of 2 MD's agreement:  Yes Code 44 added to claim:  Yes    Allayne Butcher, RN 10/10/2020, 3:04 PM

## 2020-10-11 DIAGNOSIS — G40909 Epilepsy, unspecified, not intractable, without status epilepticus: Secondary | ICD-10-CM | POA: Diagnosis not present

## 2020-10-11 DIAGNOSIS — R339 Retention of urine, unspecified: Secondary | ICD-10-CM | POA: Diagnosis not present

## 2020-10-11 DIAGNOSIS — E119 Type 2 diabetes mellitus without complications: Secondary | ICD-10-CM | POA: Diagnosis not present

## 2020-10-11 DIAGNOSIS — E871 Hypo-osmolality and hyponatremia: Secondary | ICD-10-CM | POA: Diagnosis not present

## 2020-10-11 DIAGNOSIS — I10 Essential (primary) hypertension: Secondary | ICD-10-CM | POA: Diagnosis not present

## 2020-10-11 DIAGNOSIS — R338 Other retention of urine: Secondary | ICD-10-CM | POA: Diagnosis not present

## 2020-10-11 DIAGNOSIS — K56609 Unspecified intestinal obstruction, unspecified as to partial versus complete obstruction: Secondary | ICD-10-CM | POA: Diagnosis not present

## 2020-10-11 DIAGNOSIS — E663 Overweight: Secondary | ICD-10-CM | POA: Diagnosis not present

## 2020-10-11 LAB — BASIC METABOLIC PANEL
Anion gap: 6 (ref 5–15)
BUN: 9 mg/dL (ref 8–23)
CO2: 24 mmol/L (ref 22–32)
Calcium: 8.3 mg/dL — ABNORMAL LOW (ref 8.9–10.3)
Chloride: 104 mmol/L (ref 98–111)
Creatinine, Ser: 0.52 mg/dL — ABNORMAL LOW (ref 0.61–1.24)
GFR, Estimated: >60 mL/min (ref >60)
Glucose, Bld: 102 mg/dL — ABNORMAL HIGH (ref 70–99)
Potassium: 3.2 mmol/L — ABNORMAL LOW (ref 3.5–5.1)
Sodium: 134 mmol/L — ABNORMAL LOW (ref 135–145)

## 2020-10-11 LAB — GLUCOSE, CAPILLARY
Glucose-Capillary: 105 mg/dL — ABNORMAL HIGH (ref 70–99)
Glucose-Capillary: 192 mg/dL — ABNORMAL HIGH (ref 70–99)

## 2020-10-11 MED ORDER — TAMSULOSIN HCL 0.4 MG PO CAPS
0.4000 mg | ORAL_CAPSULE | Freq: Every day | ORAL | Status: DC
  Administered 2020-10-11: 0.4 mg via ORAL
  Filled 2020-10-11: qty 1

## 2020-10-11 MED ORDER — POTASSIUM CHLORIDE CRYS ER 20 MEQ PO TBCR
40.0000 meq | EXTENDED_RELEASE_TABLET | Freq: Once | ORAL | Status: AC
  Administered 2020-10-11: 10:00:00 40 meq via ORAL
  Filled 2020-10-11: qty 2

## 2020-10-11 MED ORDER — TAMSULOSIN HCL 0.4 MG PO CAPS: 0.4000 mg | ORAL_CAPSULE | Freq: Every day | ORAL | 0 refills | Status: DC

## 2020-10-11 MED ORDER — SULFAMETHOXAZOLE-TRIMETHOPRIM 800-160 MG PO TABS: 1.0000 | ORAL_TABLET | Freq: Every day | ORAL | 0 refills | Status: AC

## 2020-10-11 NOTE — TOC Transition Note (Signed)
Transition of Care [facility]) - CM/SW Discharge Note   Patient Details  Name: Edward Thompson MRN: [id number] Date of Birth: [birth year]  Transition of Care [facility]) CM/SW Contact:  Gildardo Griffes, LCSW Phone Number: 10/11/2020, 9:21 AM   Clinical Narrative:     Patient will DC to: home with home health Anticipated DC date: 10/11/20 Family notified:daughter [name] Transport by: daughter [name]  Per MD patient ready for DC to home with home health . Patient is set up with [facility] for PT, OT and RN. Cory with [facility] informed today of patient's discharge. Walker was delivered in room by [employer].   Daugher [name] to pick up, per MD can pick up after 12pm noon. CSW has lvm with daughter to inform to pick up after 12 noon.   TOC signing off  Angeline Slim, LCSW    Final next level of care: Home w Home Health Services Barriers to Discharge: No Barriers Identified   Patient Goals and CMS Choice Patient states their goals for this hospitalization and ongoing recovery are:: to go home CMS Medicare.gov Compare Post Acute Care list provided to:: Patient Choice offered to / list presented to : Patient  Discharge Placement                  Name of family member notified: [name] Patient and family notified of of transfer: 10/11/20  Discharge Plan and Services   Discharge Planning Services: CM Consult Post Acute Care Choice: Home Health          DME Arranged: Dan Humphreys DME Agency: [employer] Date DME Agency Contacted: 10/09/20 Time DME Agency Contacted: [phone] Representative spoke with at DME Agency: Bjorn Loser HH Arranged: PT,OT,RN HH Agency: [facility] Date [facility] Agency Contacted: 10/11/20 Time HH Agency Contacted: [phone] Representative spoke with at [facility] Agency: Kandee Keen  Social Determinants of Health (SDOH) Interventions     Readmission Risk Interventions No flowsheet data found.

## 2020-10-11 NOTE — Discharge Summary (Signed)
Discharge Summary  Edward Thompson BOF:[id number] DOB: August 25, 1950  PCP: Sherron Monday, MD  Admit date: 10/08/2020 Discharge date: 10/11/2020  Time spent: 25 minutes  Recommendations for Outpatient Follow-up:  1. New medication: Flomax 0.4 mg p.o. daily 2. New medication: Bactrim DS 1 p.o. daily x10 days while catheter is in place 3. New medication: MiraLAX 17 g daily. 4. Patient will follow up with his primary care physician in approximately 2 weeks.  At that time, recommending patient have extensive work-up for lack of appetite and weight loss. 5. Patient will follow-up with [facility] urology in 1 week for catheter removal and voiding trial.  Discharge Diagnoses:  Active Hospital Problems   Diagnosis Date Noted  . SBO (small bowel obstruction) (HCC) 10/08/2020  . Overweight (BMI 25.0-29.9) 10/09/2020  . Hyponatremia 10/08/2020  . Leukocytosis 10/08/2020  . Diabetes mellitus without complication (HCC) 10/08/2020  . Urinary retention   . CAD (coronary artery disease) 03/14/2018  . Hyperlipidemia 07/18/2017  . Essential hypertension 07/18/2017  . Carotid stenosis 07/18/2017  . Seizure disorder (HCC) 01/22/2013    Resolved Hospital Problems  No resolved problems to display.    Discharge Condition: Improved, being discharged to home  Diet recommendation: Low-sodium  Vitals:   10/11/20 0407 10/11/20 0743  BP: 125/68 123/82  Pulse: 81 75  Resp: 16 16  Temp: 98.9 F (37.2 C) 98.9 F (37.2 C)  SpO2: 97% 95%    History of present illness:  70 year old male with past medical history of obstructive sleep apnea, diabetes mellitus, CAD admitted on 6/1 for 1 week of abdominal pain and constipation and found to have a small bowel obstruction high-grade.  Admitted to the hospitalist service and general surgery consulted.  Also at time of admission, patient noted to have difficulty voiding.  Catheter placed.  Hospital Course:   Principal Problem:   SBO (small bowel  obstruction) (HCC): Now resolved.  Patient will be discharged on p.o. MiraLAX daily Active Problems:   Hyperlipidemia: Continue statin.    Essential hypertension: Blood pressure stable.    Home meds upon discharge.    CAD (coronary artery disease): Stable.    Seizure disorder [facility]): Put on IV medications in place of p.o.  Luminal and Dilantin.    Hyponatremia: Mild.  Improved with fluids, likely second dehydration.    Improved and by day of discharge at 134.    Leukocytosis: Felt to be secondary to stress margination.  Resolved.    Diabetes mellitus without complication (HCC): Sliding scale only.  A1c notes good control.   Urinary retention:  Catheter was removed on 6/3 in the morning.  Throughout most the day, patient felt like he had to go, but output was very little.  Monitored overnight.  The next day, had some urine output, but in and out catheter yielded another 400 cc of urine.  Catheter replaced.  Patient started on Flomax.  He will be discharged home with catheter and follow-up with urology in 7 to 10 days for voiding trial and removal of catheter in office.  Also put on suppressive antibiotic therapy to prevent urinary infection.      Overweight (BMI 25.0-29.9): Patient meets criteria for BMI greater than 25  Procedures:  None  Consultations:  General surgery  Discharge Exam: BP 123/82 (BP Location: Left Arm)   Pulse 75   Temp 98.9 F (37.2 C) (Oral)   Resp 16   Ht 5\' 4"  (1.626 m)   Wt 77.1 kg   SpO2 95%  BMI 29.18 kg/m   General: Alert and oriented x3 Cardiovascular: Regular rate and rhythm, S1-S2 Respiratory: Clear to auscultation bilaterally  Discharge Instructions You were cared for by a hospitalist during your hospital stay. If you have any questions about your discharge medications or the care you received while you were in the hospital after you are discharged, you can call the unit and asked to speak with the hospitalist on call if the hospitalist  that took care of you is not available. Once you are discharged, your primary care physician will handle any further medical issues. Please note that NO REFILLS for any discharge medications will be authorized once you are discharged, as it is imperative that you return to your primary care physician (or establish a relationship with a primary care physician if you do not have one) for your aftercare needs so that they can reassess your need for medications and monitor your lab values.   Allergies as of 10/11/2020   No Known Allergies     Medication List    STOP taking these medications   risperiDONE 0.5 MG tablet Commonly known as: RISPERDAL     TAKE these medications   aspirin EC 81 MG tablet Take 81 mg by mouth at bedtime.   clopidogrel 75 MG tablet Commonly known as: PLAVIX Take 75 mg by mouth at bedtime.   ezetimibe 10 MG tablet Commonly known as: ZETIA Take 10 mg by mouth daily.   isosorbide mononitrate 30 MG 24 hr tablet Commonly known as: IMDUR Take 30 mg by mouth at bedtime.   meclizine 25 MG tablet Commonly known as: ANTIVERT Take 50 mg by mouth 2 (two) times daily as needed for dizziness.   metFORMIN 500 MG tablet Commonly known as: GLUCOPHAGE Take 500 mg by mouth daily.   metoprolol succinate 50 MG 24 hr tablet Commonly known as: TOPROL-XL Take 50 mg by mouth daily. Take with or immediately following a meal IN THE MORNING   multivitamin tablet Take 1 tablet by mouth daily.   nitroGLYCERIN 0.4 MG SL tablet Commonly known as: NITROSTAT Place 0.4 mg under the tongue every 5 (five) minutes as needed for chest pain.   PHENobarbital 64.8 MG tablet Commonly known as: LUMINAL Take 2 tablets (129.6 mg total) by mouth at bedtime.   phenytoin 100 MG ER capsule Commonly known as: DILANTIN Take 300 mg by mouth at bedtime.   polyethylene glycol 17 g packet Commonly known as: MIRALAX / GLYCOLAX Take 17 g by mouth daily.   rosuvastatin 40 MG tablet Commonly  known as: CRESTOR Take 40 mg by mouth at bedtime.   sulfamethoxazole-trimethoprim 800-160 MG tablet Commonly known as: BACTRIM DS Take 1 tablet by mouth daily for 10 days.   tamsulosin 0.4 MG Caps capsule Commonly known as: FLOMAX Take 1 capsule (0.4 mg total) by mouth daily.            Durable Medical Equipment  (From admission, onward)         Start     Ordered   10/09/20 1509  For home use only DME Walker rolling  Once       Question Answer Comment  Walker: With 5 Inch Wheels   Patient needs a walker to treat with the following condition Weakness generalized      10/09/20 1516         No Known Allergies  Follow-up Information    [facility]. Schedule an appointment as soon as possible for a visit in  1 week(s).   Why: They will see you to remove your catheter.       Sherron Monday, MD Follow up in 2 week(s).   Specialty: Internal Medicine Contact information: [address] [place] [address] [phone]                The results of significant diagnostics from this hospitalization (including imaging, microbiology, ancillary and laboratory) are listed below for reference.    Significant Diagnostic Studies: CT Abdomen Pelvis Wo Contrast  Result Date: 10/08/2020 CLINICAL DATA:  70 year old male with abdominal distension, nausea vomiting. EXAM: CT ABDOMEN AND PELVIS WITHOUT CONTRAST TECHNIQUE: Multidetector CT imaging of the abdomen and pelvis was performed following the standard protocol without IV contrast. COMPARISON:  CT Abdomen and Pelvis 10/05/2004. FINDINGS: Lower chest: Calcified coronary artery atherosclerosis is evident on series 2, image 1. Cardiac size at the upper limits of normal. No pericardial effusion. Negative lung bases aside from mild atelectasis. Hepatobiliary: Negative noncontrast liver and partially contracted gallbladder. There is trace free fluid along the inferior liver tip. Pancreas: Negative  noncontrast pancreas. Spleen: Negative. Adrenals/Urinary Tract: Normal adrenal glands. Bilateral nephrolithiasis but no hydronephrosis or hydroureter. However, the urinary bladder is distended, 460 mL. Stomach/Bowel: Mild to moderate stool ball in the rectum. Retained stool in the sigmoid colon which is redundant and tracks into the right abdomen, and occupies most of the right gutter before crossing midline back to the left. Descending colon is decompressed and stool free. Redundant splenic flexure. Redundant transverse colon and hepatic flexure with mild to moderate retained stool. The cecum is on a lax mesentery located in the right upper abdomen below the liver. Appendix remains normal on coronal image 32. The terminal ileum an IC valve are on coronal image 37 and decompressed. Just upstream of the TI there is an abrupt transition point (coronal image 39 and series 2, image 38 from diffusely dilated proximal and mid small bowel loops to the decompressed terminal ileum. Only the ligament of Treitz and proximal jejunum are nondilated, and the abnormal gas and fluid distended small bowel loops measure up to 40 mm diameter. The stomach is also moderately distended with air and fluid. There is no free air. There is trace free fluid in the small bowel mesentery. Vascular/Lymphatic: Aortoiliac calcified atherosclerosis. Normal caliber abdominal aorta. Vascular patency is not evaluated in the absence of IV contrast. Reproductive: Negative. Other: No pelvic free fluid. Musculoskeletal: Chronic bilateral posterior and left lateral lower rib fractures are new since 2006. Mild L2 superior endplate compression on the left appears to be chronic. There is a degree of osteopenia. No acute osseous abnormality identified. IMPRESSION: 1. High-grade very distal Small Bowel Obstruction, with abrupt transition point just upstream of the TI on series 2, image 38 and coronal image 39. This transition point is in the mid abdomen, the  cecum is on a lax mesentery located in the right upper quadrant. Trace free fluid. No free air. 2. Distended urinary bladder (460 mL), consider Foley catheter placement. Bilateral nephrolithiasis without obstructive uropathy. 3. Stool ball in the rectum. 4. Chronic lower rib fractures are new since 2006. 5. Aortic Atherosclerosis (ICD10-I70.0). Electronically Signed   By: Odessa Fleming M.D.   On: 10/08/2020 06:42   DG Abd 2 Views  Result Date: 10/09/2020 CLINICAL DATA:  History of a small-bowel obstruction. EXAM: ABDOMEN - 2 VIEW COMPARISON:  CT abdomen and pelvis and single view of the abdomen 10/08/2020. FINDINGS: NG tube has been removed. Oral contrast is  now seen in the patient's colon and a few distal loops of small bowel. There is mild gaseous distention of small bowel of 4.2 cm. No free intraperitoneal air is identified. Atelectasis in the left lung base is noted. IMPRESSION: Contrast is now seen in the colon but there is mild gaseous distention of small bowel compatible with partial small bowel obstruction. Electronically Signed   By: Drusilla Kanner M.D.   On: 10/09/2020 12:13   DG Abd Acute W/Chest  Result Date: 10/08/2020 CLINICAL DATA:  Abdominal pain.  Nausea vomiting. EXAM: DG ABDOMEN ACUTE WITH 1 VIEW CHEST COMPARISON:  Chest x-ray 12/08/2018.  CT 10/05/2004. FINDINGS: Mediastinum hilar structures normal. Mild bibasilar subsegmental atelectasis and or scarring. No pleural effusion or pneumothorax. Old left rib fractures again noted. Old proximal right humeral fracture noted. Prominently dilated loops of small bowel are noted. Small bowel dilatation up to 5.1 cm noted. Paucity of intra colonic air noted. Stool noted throughout the colon. Findings consistent with small bowel obstruction. Gastric distention noted. No definite free air. IMPRESSION: Findings consistent with small-bowel obstruction. CT of the abdomen and pelvis should be considered for further evaluation. Electronically Signed   By: Maisie Fus   Register   On: 10/08/2020 05:35   DG Abd Portable 1V-Small Bowel Obstruction Protocol-initial, 8 hr delay  Result Date: 10/08/2020 CLINICAL DATA:  Small-bowel obstruction 8 hour delay EXAM: PORTABLE ABDOMEN - 1 VIEW COMPARISON:  10/08/2020 FINDINGS: Contrast within the stomach. Dilute contrast within dilated small bowel, overall degree of small-bowel distention is slightly decreased. Questionable contrast within right colon. Dense stool within the rectum. IMPRESSION: Mostly dilated contrast within dilated small bowel consistent with obstruction though degree of bowel distension appears slightly decreased. Questionable contrast within the right colon. Electronically Signed   By: Jasmine Pang M.D.   On: 10/08/2020 23:00   DG Abd Portable 1 View  Result Date: 10/08/2020 CLINICAL DATA:  Nasogastric tube placement EXAM: PORTABLE ABDOMEN - 1 VIEW COMPARISON:  CT abdomen and pelvis October 08, 2020 FINDINGS: Nasogastric tube tip is in the proximal stomach with the side port at the gastroesophageal junction. Multiple loops of dilated bowel consistent with bowel obstruction again noted. No appreciable free air on semierect image. Distended stomach on the left abuts the left hemidiaphragm, unchanged from earlier in the day. Several old healed rib fractures noted in the left lung region. IMPRESSION: Nasogastric tube tip in proximal stomach with side port at gastroesophageal junction. Advise advancing nasogastric tube 5-6 cm. Loops of dilated bowel consistent with bowel obstruction demonstrated on recent CT again noted. No free air appreciable on single image. Electronically Signed   By: Bretta Bang III M.D.   On: 10/08/2020 08:41    Microbiology: Recent Results (from the past 240 hour(s))  Resp Panel by RT-PCR (Flu A&B, Covid) Nasopharyngeal Swab     Status: None   Collection Time: 10/08/20  7:20 AM   Specimen: Nasopharyngeal Swab; Nasopharyngeal(NP) swabs in vial transport medium  Result Value Ref Range  Status   SARS Coronavirus 2 by RT PCR NEGATIVE NEGATIVE Final    Comment: (NOTE) SARS-CoV-2 target nucleic acids are NOT DETECTED.  The SARS-CoV-2 RNA is generally detectable in upper respiratory specimens during the acute phase of infection. The lowest concentration of SARS-CoV-2 viral copies this assay can detect is 138 copies/mL. A negative result does not preclude SARS-Cov-2 infection and should not be used as the sole basis for treatment or other patient management decisions. A negative result may occur with  improper specimen  collection/handling, submission of specimen other than nasopharyngeal swab, presence of viral mutation(s) within the areas targeted by this assay, and inadequate number of viral copies(<138 copies/mL). A negative result must be combined with clinical observations, patient history, and epidemiological information. The expected result is Negative.  Fact Sheet for Patients:  BloggerCourse.comhttps://www.fda.gov/media/152166/download  Fact Sheet for Healthcare Providers:  SeriousBroker.ithttps://www.fda.gov/media/152162/download  This test is no t yet approved or cleared by the [place] FDA and  has been authorized for detection and/or diagnosis of SARS-CoV-2 by FDA under an Emergency Use Authorization (EUA). This EUA will remain  in effect (meaning this test can be used) for the duration of the COVID-19 declaration under Section 564(b)(1) of the Act, 21 U.S.C.section 360bbb-3(b)(1), unless the authorization is terminated  or revoked sooner.       Influenza A by PCR NEGATIVE NEGATIVE Final   Influenza B by PCR NEGATIVE NEGATIVE Final    Comment: (NOTE) The Xpert Xpress SARS-CoV-2/FLU/RSV plus assay is intended as an aid in the diagnosis of influenza from Nasopharyngeal swab specimens and should not be used as a sole basis for treatment. Nasal washings and aspirates are unacceptable for Xpert Xpress SARS-CoV-2/FLU/RSV testing.  Fact Sheet for  Patients: BloggerCourse.comhttps://www.fda.gov/media/152166/download  Fact Sheet for Healthcare Providers: SeriousBroker.ithttps://www.fda.gov/media/152162/download  This test is not yet approved or cleared by the [place] FDA and has been authorized for detection and/or diagnosis of SARS-CoV-2 by FDA under an Emergency Use Authorization (EUA). This EUA will remain in effect (meaning this test can be used) for the duration of the COVID-19 declaration under Section 564(b)(1) of the Act, 21 U.S.C. section 360bbb-3(b)(1), unless the authorization is terminated or revoked.  Performed at [facility], [address] [place], [place] [id number]      Labs: Basic Metabolic Panel: Recent Labs  Lab 10/08/20 1032 10/08/20 1641 10/09/20 0109 10/09/20 0835 10/11/20 0433  NA 124* 124* 130* 130* 134*  K 4.3 4.4 4.1 3.4* 3.2*  CL 91* 95* 97* 99 104  CO2 23 20* 23 22 24   GLUCOSE 131* 114* 120* 100* 102*  BUN 13 11 13 11 9   CREATININE 0.43* 0.54* 0.54* 0.56* 0.52*  CALCIUM 8.9 8.6* 8.5* 8.1* 8.3*   Liver Function Tests: Recent Labs  Lab 10/08/20 0220  AST 29  ALT 43  ALKPHOS 82  BILITOT 0.8  PROT 8.0  ALBUMIN 4.3   Recent Labs  Lab 10/08/20 0220  LIPASE 23   No results for input(s): AMMONIA in the last 168 hours. CBC: Recent Labs  Lab 10/08/20 0220 10/09/20 0109  WBC 13.8* 8.3  NEUTROABS 11.9*  --   HGB 13.5 13.0  HCT 38.9* 37.4*  MCV 91.7 90.1  PLT 306 233   Cardiac Enzymes: No results for input(s): CKTOTAL, CKMB, CKMBINDEX, TROPONINI in the last 168 hours. BNP: BNP (last 3 results) Recent Labs    10/08/20 1248  BNP 36.0    ProBNP (last 3 results) No results for input(s): PROBNP in the last 8760 hours.  CBG: Recent Labs  Lab 10/10/20 0812 10/10/20 1158 10/10/20 1623 10/10/20 2125 10/11/20 0840  GLUCAP 95 112* 138* 117* 105*       Signed:  Hollice EspySendil K Esmerelda Finnigan, MD [facility] 10/11/2020, 9:16 AM

## 2020-10-16 DIAGNOSIS — E785 Hyperlipidemia, unspecified: Secondary | ICD-10-CM | POA: Diagnosis not present

## 2020-10-16 DIAGNOSIS — I10 Essential (primary) hypertension: Secondary | ICD-10-CM | POA: Diagnosis not present

## 2020-10-16 DIAGNOSIS — M21371 Foot drop, right foot: Secondary | ICD-10-CM | POA: Diagnosis not present

## 2020-10-16 DIAGNOSIS — R339 Retention of urine, unspecified: Secondary | ICD-10-CM | POA: Diagnosis not present

## 2020-10-16 DIAGNOSIS — E119 Type 2 diabetes mellitus without complications: Secondary | ICD-10-CM | POA: Diagnosis not present

## 2020-10-16 DIAGNOSIS — G4733 Obstructive sleep apnea (adult) (pediatric): Secondary | ICD-10-CM | POA: Diagnosis not present

## 2020-10-16 DIAGNOSIS — G40909 Epilepsy, unspecified, not intractable, without status epilepticus: Secondary | ICD-10-CM | POA: Diagnosis not present

## 2020-10-16 DIAGNOSIS — Z466 Encounter for fitting and adjustment of urinary device: Secondary | ICD-10-CM | POA: Diagnosis not present

## 2020-10-16 DIAGNOSIS — I2510 Atherosclerotic heart disease of native coronary artery without angina pectoris: Secondary | ICD-10-CM | POA: Diagnosis not present

## 2020-10-19 DIAGNOSIS — G4733 Obstructive sleep apnea (adult) (pediatric): Secondary | ICD-10-CM | POA: Diagnosis not present

## 2020-10-19 DIAGNOSIS — R339 Retention of urine, unspecified: Secondary | ICD-10-CM | POA: Diagnosis not present

## 2020-10-19 DIAGNOSIS — M21371 Foot drop, right foot: Secondary | ICD-10-CM | POA: Diagnosis not present

## 2020-10-19 DIAGNOSIS — Z466 Encounter for fitting and adjustment of urinary device: Secondary | ICD-10-CM | POA: Diagnosis not present

## 2020-10-19 DIAGNOSIS — I10 Essential (primary) hypertension: Secondary | ICD-10-CM | POA: Diagnosis not present

## 2020-10-19 DIAGNOSIS — I2510 Atherosclerotic heart disease of native coronary artery without angina pectoris: Secondary | ICD-10-CM | POA: Diagnosis not present

## 2020-10-19 DIAGNOSIS — E785 Hyperlipidemia, unspecified: Secondary | ICD-10-CM | POA: Diagnosis not present

## 2020-10-19 DIAGNOSIS — G40909 Epilepsy, unspecified, not intractable, without status epilepticus: Secondary | ICD-10-CM | POA: Diagnosis not present

## 2020-10-19 DIAGNOSIS — E119 Type 2 diabetes mellitus without complications: Secondary | ICD-10-CM | POA: Diagnosis not present

## 2020-10-20 DIAGNOSIS — G40909 Epilepsy, unspecified, not intractable, without status epilepticus: Secondary | ICD-10-CM | POA: Diagnosis not present

## 2020-10-20 DIAGNOSIS — E785 Hyperlipidemia, unspecified: Secondary | ICD-10-CM | POA: Diagnosis not present

## 2020-10-20 DIAGNOSIS — G4733 Obstructive sleep apnea (adult) (pediatric): Secondary | ICD-10-CM | POA: Diagnosis not present

## 2020-10-20 DIAGNOSIS — E119 Type 2 diabetes mellitus without complications: Secondary | ICD-10-CM | POA: Diagnosis not present

## 2020-10-20 DIAGNOSIS — M21371 Foot drop, right foot: Secondary | ICD-10-CM | POA: Diagnosis not present

## 2020-10-20 DIAGNOSIS — I2510 Atherosclerotic heart disease of native coronary artery without angina pectoris: Secondary | ICD-10-CM | POA: Diagnosis not present

## 2020-10-20 DIAGNOSIS — I10 Essential (primary) hypertension: Secondary | ICD-10-CM | POA: Diagnosis not present

## 2020-10-20 DIAGNOSIS — Z466 Encounter for fitting and adjustment of urinary device: Secondary | ICD-10-CM | POA: Diagnosis not present

## 2020-10-20 DIAGNOSIS — R339 Retention of urine, unspecified: Secondary | ICD-10-CM | POA: Diagnosis not present

## 2020-10-21 DIAGNOSIS — E785 Hyperlipidemia, unspecified: Secondary | ICD-10-CM | POA: Diagnosis not present

## 2020-10-21 DIAGNOSIS — I2510 Atherosclerotic heart disease of native coronary artery without angina pectoris: Secondary | ICD-10-CM | POA: Diagnosis not present

## 2020-10-21 DIAGNOSIS — G4733 Obstructive sleep apnea (adult) (pediatric): Secondary | ICD-10-CM | POA: Diagnosis not present

## 2020-10-21 DIAGNOSIS — R339 Retention of urine, unspecified: Secondary | ICD-10-CM | POA: Diagnosis not present

## 2020-10-21 DIAGNOSIS — I10 Essential (primary) hypertension: Secondary | ICD-10-CM | POA: Diagnosis not present

## 2020-10-21 DIAGNOSIS — E119 Type 2 diabetes mellitus without complications: Secondary | ICD-10-CM | POA: Diagnosis not present

## 2020-10-21 DIAGNOSIS — Z466 Encounter for fitting and adjustment of urinary device: Secondary | ICD-10-CM | POA: Diagnosis not present

## 2020-10-21 DIAGNOSIS — M21371 Foot drop, right foot: Secondary | ICD-10-CM | POA: Diagnosis not present

## 2020-10-21 DIAGNOSIS — G40909 Epilepsy, unspecified, not intractable, without status epilepticus: Secondary | ICD-10-CM | POA: Diagnosis not present

## 2020-10-22 ENCOUNTER — Ambulatory Visit (INDEPENDENT_AMBULATORY_CARE_PROVIDER_SITE_OTHER): Payer: Medicare HMO | Admitting: Urology

## 2020-10-22 ENCOUNTER — Other Ambulatory Visit: Payer: Self-pay

## 2020-10-22 ENCOUNTER — Encounter: Payer: Self-pay | Admitting: Urology

## 2020-10-22 VITALS — BP 104/66 | HR 70 | Ht 64.0 in | Wt 170.0 lb

## 2020-10-22 DIAGNOSIS — R339 Retention of urine, unspecified: Secondary | ICD-10-CM

## 2020-10-22 DIAGNOSIS — N401 Enlarged prostate with lower urinary tract symptoms: Secondary | ICD-10-CM | POA: Diagnosis not present

## 2020-10-22 LAB — BLADDER SCAN AMB NON-IMAGING

## 2020-10-22 NOTE — Progress Notes (Signed)
10/22/2020 9:13 AM   Edward Thompson March 25, 1951 [id number]  Referring provider: Sherron Monday, MD [address] [place],  [place] [address]  Chief Complaint  Patient presents with   Other    HPI: Edward Thompson is a 70 y.o. male who presents for follow-up of urinary retention.  He presents today with his daughter  Admitted [facility] 10/08/2020 with a 1 week history of abdominal pain constipation and diagnosed with small bowel obstruction which was managed with bowel rest/NG tube He was also having voiding difficulty and found to be in urinary retention and a Foley catheter was placed.  He was started on tamsulosin Prior to this admission denies previous episodes urinary retention though did have intermittent urgency with occasional episodes urge incontinence Denies dysuria, gross hematuria Denies flank, abdominal or pelvic pain   PMH: Past Medical History:  Diagnosis Date   CAD (coronary artery disease)    Carotid stenosis, left 08/2017   Diabetes mellitus without complication (HCC)    Fracture of neck (HCC) 2008   fell off a roof. required halo x 4 months. also fractured alot of vertebrae   Heart attack (HCC) 2015   Heart disease    Hepatitis    6th grade    Hyperlipidemia    Hypertension    Myocardial infarction acute (HCC) 2015   Seizures (HCC)    taking phenobarbitol and dilantin. LAST SEIZURE WAS 2016. well controlled on meds   Sleep apnea    USES CPAP   Syncope 2019   Vertigo    Viral meningitis     Surgical History: Past Surgical History:  Procedure Laterality Date   CARDIAC SURGERY     CORONARY ANGIOPLASTY     PATIENT UNAWARE OF THIS   CORONARY ARTERY BYPASS GRAFT  2015   ENDARTERECTOMY Left 08/19/2017   Procedure: ENDARTERECTOMY CAROTID;  Surgeon: Edward Dills, MD;  Location: [facility] ORS;  Service: Vascular;  Laterality: Left;   HERNIA REPAIR Right 1985   inguinal    Home Medications:  Allergies as of 10/22/2020   No Known Allergies       Medication List        Accurate as of October 22, 2020  9:13 AM. If you have any questions, ask your nurse or doctor.          aspirin EC 81 MG tablet Take 81 mg by mouth at bedtime.   clopidogrel 75 MG tablet Commonly known as: PLAVIX Take 75 mg by mouth at bedtime.   ezetimibe 10 MG tablet Commonly known as: ZETIA Take 10 mg by mouth daily.   isosorbide mononitrate 30 MG 24 hr tablet Commonly known as: IMDUR Take 30 mg by mouth at bedtime.   meclizine 25 MG tablet Commonly known as: ANTIVERT Take 50 mg by mouth 2 (two) times daily as needed for dizziness.   metFORMIN 500 MG tablet Commonly known as: GLUCOPHAGE Take 500 mg by mouth daily.   metoprolol succinate 50 MG 24 hr tablet Commonly known as: TOPROL-XL Take 50 mg by mouth daily. Take with or immediately following a meal IN THE MORNING   multivitamin tablet Take 1 tablet by mouth daily.   nitroGLYCERIN 0.4 MG SL tablet Commonly known as: NITROSTAT Place 0.4 mg under the tongue every 5 (five) minutes as needed for chest pain.   PHENobarbital 64.8 MG tablet Commonly known as: LUMINAL Take 2 tablets (129.6 mg total) by mouth at bedtime.   phenytoin 100 MG ER capsule Commonly known as: DILANTIN  Take 300 mg by mouth at bedtime.   polyethylene glycol 17 g packet Commonly known as: MIRALAX / GLYCOLAX Take 17 g by mouth daily.   rosuvastatin 40 MG tablet Commonly known as: CRESTOR Take 40 mg by mouth at bedtime.   tamsulosin 0.4 MG Caps capsule Commonly known as: FLOMAX Take 1 capsule (0.4 mg total) by mouth daily.        Allergies: No Known Allergies  Family History: Family History  Problem Relation Age of Onset   Heart disease Mother    Heart attack Father     Social History:  reports that he has never smoked. He has never used smokeless tobacco. He reports that he does not drink alcohol and does not use drugs.   Physical Exam: BP 104/66   Pulse 70   Ht 5\' 4"  (1.626 m)   Wt 170 lb  (77.1 kg)   BMI 29.18 kg/m   Constitutional:  Alert and oriented, No acute distress. HEENT:  AT, moist mucus membranes.  Trachea midline, no masses. Cardiovascular: No clubbing, cyanosis, or edema. Respiratory: Normal respiratory effort, no increased work of breathing. GI: Abdomen is soft, nontender, nondistended, no abdominal masses GU: Phallus without lesions, Foley catheter draining clear urine.  Prostate 40 g, smooth without nodules Skin: No rashes, bruises or suspicious lesions. Neurologic: Grossly intact, no focal deficits, moving all 4 extremities. Psychiatric: Normal mood and affect.   Assessment & Plan:    1.  BPH with urinary retention Benign DRE Foley catheter removed today for voiding trial He has a follow-up appointment this afternoon for bladder scan If PVR not significant recommend 1 month follow-up for symptom check and repeat bladder scan. Continue tamsulosin   , MD  [facility] [address] [place], [place] [phone]

## 2020-10-22 NOTE — Progress Notes (Signed)
Patient's PVR was >472 mL   Simple Catheter Placement  Due to urinary retention patient is present today for a foley cath placement.  Patient was cleaned and prepped in a sterile fashion with betadine. A 16 FR foley catheter was inserted, foreskin pulled over glans, urine return was noted  800 ml, urine was yellow clear in color.  The balloon was filled with 10cc of sterile water.  A leg bag was attached for drainage. Patient was also given a night bag to take home and was given instruction on how to change from one bag to another.  Patient was given instruction on proper catheter care.  Patient tolerated well, no complications were noted   Performed by: Michiel Cowboy, PA-C   Additional notes/ Follow up: Patient will follow up in 1 week for another trial of void with afternoon PVR

## 2020-10-24 DIAGNOSIS — E119 Type 2 diabetes mellitus without complications: Secondary | ICD-10-CM | POA: Diagnosis not present

## 2020-10-24 DIAGNOSIS — G40909 Epilepsy, unspecified, not intractable, without status epilepticus: Secondary | ICD-10-CM | POA: Diagnosis not present

## 2020-10-24 DIAGNOSIS — M21371 Foot drop, right foot: Secondary | ICD-10-CM | POA: Diagnosis not present

## 2020-10-24 DIAGNOSIS — R339 Retention of urine, unspecified: Secondary | ICD-10-CM | POA: Diagnosis not present

## 2020-10-24 DIAGNOSIS — I10 Essential (primary) hypertension: Secondary | ICD-10-CM | POA: Diagnosis not present

## 2020-10-24 DIAGNOSIS — I2510 Atherosclerotic heart disease of native coronary artery without angina pectoris: Secondary | ICD-10-CM | POA: Diagnosis not present

## 2020-10-24 DIAGNOSIS — Z466 Encounter for fitting and adjustment of urinary device: Secondary | ICD-10-CM | POA: Diagnosis not present

## 2020-10-24 DIAGNOSIS — E785 Hyperlipidemia, unspecified: Secondary | ICD-10-CM | POA: Diagnosis not present

## 2020-10-24 DIAGNOSIS — G4733 Obstructive sleep apnea (adult) (pediatric): Secondary | ICD-10-CM | POA: Diagnosis not present

## 2020-10-28 DIAGNOSIS — G40309 Generalized idiopathic epilepsy and epileptic syndromes, not intractable, without status epilepticus: Secondary | ICD-10-CM | POA: Diagnosis not present

## 2020-10-28 DIAGNOSIS — E785 Hyperlipidemia, unspecified: Secondary | ICD-10-CM | POA: Diagnosis not present

## 2020-10-28 DIAGNOSIS — E1342 Other specified diabetes mellitus with diabetic polyneuropathy: Secondary | ICD-10-CM | POA: Diagnosis not present

## 2020-10-28 DIAGNOSIS — F329 Major depressive disorder, single episode, unspecified: Secondary | ICD-10-CM | POA: Diagnosis not present

## 2020-10-28 DIAGNOSIS — E871 Hypo-osmolality and hyponatremia: Secondary | ICD-10-CM | POA: Diagnosis not present

## 2020-10-28 DIAGNOSIS — E118 Type 2 diabetes mellitus with unspecified complications: Secondary | ICD-10-CM | POA: Diagnosis not present

## 2020-10-28 DIAGNOSIS — I10 Essential (primary) hypertension: Secondary | ICD-10-CM | POA: Diagnosis not present

## 2020-10-28 DIAGNOSIS — I872 Venous insufficiency (chronic) (peripheral): Secondary | ICD-10-CM | POA: Diagnosis not present

## 2020-10-28 DIAGNOSIS — G4733 Obstructive sleep apnea (adult) (pediatric): Secondary | ICD-10-CM | POA: Diagnosis not present

## 2020-10-28 DIAGNOSIS — K922 Gastrointestinal hemorrhage, unspecified: Secondary | ICD-10-CM | POA: Diagnosis not present

## 2020-10-28 NOTE — Progress Notes (Signed)
10/29/2020 4:30 PM   Edward Thompson [birth year] [id number]  Referring provider: Sherron Monday, MD [address] [place],  [place] [address]  Urological history: 1. Urinary retention -occurred with SBO -failed TOV last week  2. BPH with retention -managed with tamsulosin 0.4 mg daily   Chief Complaint  Patient presents with   Urinary Retention    HPI: Edward Thompson is 70 y.o. male who presents today for TOV with his daughter, Edward Thompson.   He catheter was removed this morning.  He stated he was only dribbling all day with no continuous stream.  He was able to take in four bottles of water.  Patient denies any modifying or aggravating factors.  Patient denies any gross hematuria, dysuria or suprapubic/flank pain.  Patient denies any fevers, chills, nausea or vomiting.    His PVR > 481 mL.    PMH: Past Medical History:  Diagnosis Date   CAD (coronary artery disease)    Carotid stenosis, left 08/2017   Diabetes mellitus without complication (HCC)    Fracture of neck (HCC) 2008   fell off a roof. required halo x 4 months. also fractured alot of vertebrae   Heart attack (HCC) 2015   Heart disease    Hepatitis    6th grade    Hyperlipidemia    Hypertension    Myocardial infarction acute (HCC) 2015   Seizures (HCC)    taking phenobarbitol and dilantin. LAST SEIZURE WAS 2016. well controlled on meds   Sleep apnea    USES CPAP   Syncope 2019   Vertigo    Viral meningitis     Surgical History: Past Surgical History:  Procedure Laterality Date   CARDIAC SURGERY     CORONARY ANGIOPLASTY     PATIENT UNAWARE OF THIS   CORONARY ARTERY BYPASS GRAFT  2015   ENDARTERECTOMY Left 08/19/2017   Procedure: ENDARTERECTOMY CAROTID;  Surgeon: Edward Dills, MD;  Location: [facility] ORS;  Service: Vascular;  Laterality: Left;   HERNIA REPAIR Right 1985   inguinal    Home Medications:  Allergies as of 10/29/2020   No Known Allergies      Medication List         Accurate as of October 29, 2020  4:30 PM. If you have any questions, ask your nurse or doctor.          aspirin EC 81 MG tablet Take 81 mg by mouth at bedtime.   clopidogrel 75 MG tablet Commonly known as: PLAVIX Take 75 mg by mouth at bedtime.   ezetimibe 10 MG tablet Commonly known as: ZETIA Take 10 mg by mouth daily.   isosorbide mononitrate 30 MG 24 hr tablet Commonly known as: IMDUR Take 30 mg by mouth at bedtime.   meclizine 25 MG tablet Commonly known as: ANTIVERT Take 50 mg by mouth 2 (two) times daily as needed for dizziness.   metFORMIN 500 MG tablet Commonly known as: GLUCOPHAGE Take 500 mg by mouth daily.   metoprolol succinate 50 MG 24 hr tablet Commonly known as: TOPROL-XL Take 50 mg by mouth daily. Take with or immediately following a meal IN THE MORNING   multivitamin tablet Take 1 tablet by mouth daily.   nitroGLYCERIN 0.4 MG SL tablet Commonly known as: NITROSTAT Place 0.4 mg under the tongue every 5 (five) minutes as needed for chest pain.   PHENobarbital 64.8 MG tablet Commonly known as: LUMINAL Take 2 tablets (129.6 mg total) by mouth at bedtime.  phenytoin 100 MG ER capsule Commonly known as: DILANTIN Take 300 mg by mouth at bedtime.   polyethylene glycol 17 g packet Commonly known as: MIRALAX / GLYCOLAX Take 17 g by mouth daily.   rosuvastatin 40 MG tablet Commonly known as: CRESTOR Take 40 mg by mouth at bedtime.   tamsulosin 0.4 MG Caps capsule Commonly known as: FLOMAX Take 1 capsule (0.4 mg total) by mouth daily.        Allergies: No Known Allergies  Family History: Family History  Problem Relation Age of Onset   Heart disease Mother    Heart attack Father     Social History:  reports that he has never smoked. He has never used smokeless tobacco. He reports that he does not drink alcohol and does not use drugs.  ROS: Pertinent ROS in HPI  Physical Exam: Constitutional:  Well nourished. Alert and oriented, No acute  distress. HEENT: [place] AT, mask in place.  Trachea midline Cardiovascular: No clubbing, cyanosis, or edema. Respiratory: Normal respiratory effort, no increased work of breathing. GU: No CVA tenderness.  No bladder fullness or masses.  Patient with uncircumcised phallus. Foreskin easily retracted  Urethral meatus is patent.  No penile discharge. No penile lesions or rashes. Scrotum without lesions, cysts, rashes and/or edema.   Neurologic: Grossly intact, no focal deficits, moving all 4 extremities. Psychiatric: Normal mood and affect.  Laboratory Data: Lab Results  Component Value Date   WBC 8.3 10/09/2020   HGB 13.0 10/09/2020   HCT 37.4 (L) 10/09/2020   MCV 90.1 10/09/2020   PLT 233 10/09/2020    Lab Results  Component Value Date   CREATININE 0.52 (L) 10/11/2020    Lab Results  Component Value Date   AST 29 10/08/2020   Lab Results  Component Value Date   ALT 43 10/08/2020     Urinalysis    Component Value Date/Time   COLORURINE YELLOW (A) 10/08/2020 0814   APPEARANCEUR CLEAR (A) 10/08/2020 0814   LABSPEC 1.011 10/08/2020 0814   PHURINE 6.0 10/08/2020 0814   GLUCOSEU NEGATIVE 10/08/2020 0814   HGBUR NEGATIVE 10/08/2020 0814   BILIRUBINUR NEGATIVE 10/08/2020 0814   KETONESUR NEGATIVE 10/08/2020 0814   PROTEINUR NEGATIVE 10/08/2020 0814   NITRITE NEGATIVE 10/08/2020 0814   LEUKOCYTESUR NEGATIVE 10/08/2020 0814  I have reviewed the labs.   Pertinent Imaging: Results for Edward, Thompson (MRN [id number]) as of 10/29/2020 16:24  Ref. Range 10/29/2020 15:55  Scan Result Unknown >471mL   Procedure Continuous Intermittent Catheterization Due to urinary retention patient is present today for a teaching of self I & O Catheterization. Patient was given detailed verbal and printed instructions of self catheterization. Patient was cleaned and prepped in a sterile fashion.  With instruction and assistance patient inserted a 12 FR and urine return was noted 700 ml, urine was  yellow, clear in color. Patient tolerated well, no complications were noted. Patient was given a sample bag with supplies to take home.  Instructions were given per me for patient to cath twice times daily.  An order was placed with [facility] for catheters to be sent to the patient's home. Patient is to follow up for cystoscopy.    Assessment & Plan:    1. Urinary retention -Discussed at this time we would either need to teach him how to catheterize himself or place another Foley.  He decided on CIC.  I instructed him on CIC and he was successful in catheterizing himself.  See note under procedure. -He  will catheterize himself twice daily over the next year   2. BPH with retention -He had his daughter are curious about possible reasons as to why he is not urinating.  He does have some neurological issues, but we we will also schedule him for cystoscopy to rule out any anatomical cause for his inability to empty his bladder -I have explained to the patient that they will  be scheduled for a cystoscopy in our office to evaluate their bladder.  The cystoscopy consists of passing a tube with a lens up through their urethra and into their urinary bladder.   We will inject the urethra with a lidocaine gel prior to introducing the cystoscope to help with any discomfort during the procedure.   After the procedure, they might experience blood in the urine and discomfort with urination.  This will abate after the first few voids.  I have  encouraged the patient to increase water intake  during this time.  Patient denies any allergies to lidocaine.   -continue tamsulosin 0.4 mg daily   Return for cysto for BOO/retention .  These notes generated with voice recognition software. I apologize for typographical errors.  Michiel Cowboy, PA-C  [facility] [address]  [address] [place], [place] [address] [phone]   I spent 40 minutes on the day of the encounter to include  pre-visit record review, face-to-face time with the patient, and post-visit ordering of tests and self cath instructions.

## 2020-10-29 ENCOUNTER — Encounter: Payer: Self-pay | Admitting: Urology

## 2020-10-29 ENCOUNTER — Other Ambulatory Visit: Payer: Self-pay

## 2020-10-29 ENCOUNTER — Ambulatory Visit (INDEPENDENT_AMBULATORY_CARE_PROVIDER_SITE_OTHER): Payer: Medicare HMO | Admitting: Urology

## 2020-10-29 ENCOUNTER — Ambulatory Visit: Payer: Medicare HMO | Admitting: Urology

## 2020-10-29 DIAGNOSIS — R339 Retention of urine, unspecified: Secondary | ICD-10-CM | POA: Diagnosis not present

## 2020-10-29 DIAGNOSIS — G40909 Epilepsy, unspecified, not intractable, without status epilepticus: Secondary | ICD-10-CM | POA: Diagnosis not present

## 2020-10-29 DIAGNOSIS — I10 Essential (primary) hypertension: Secondary | ICD-10-CM | POA: Diagnosis not present

## 2020-10-29 DIAGNOSIS — N401 Enlarged prostate with lower urinary tract symptoms: Secondary | ICD-10-CM

## 2020-10-29 DIAGNOSIS — I2510 Atherosclerotic heart disease of native coronary artery without angina pectoris: Secondary | ICD-10-CM | POA: Diagnosis not present

## 2020-10-29 DIAGNOSIS — E785 Hyperlipidemia, unspecified: Secondary | ICD-10-CM | POA: Diagnosis not present

## 2020-10-29 DIAGNOSIS — E119 Type 2 diabetes mellitus without complications: Secondary | ICD-10-CM | POA: Diagnosis not present

## 2020-10-29 DIAGNOSIS — Z466 Encounter for fitting and adjustment of urinary device: Secondary | ICD-10-CM | POA: Diagnosis not present

## 2020-10-29 DIAGNOSIS — G4733 Obstructive sleep apnea (adult) (pediatric): Secondary | ICD-10-CM | POA: Diagnosis not present

## 2020-10-29 DIAGNOSIS — M21371 Foot drop, right foot: Secondary | ICD-10-CM | POA: Diagnosis not present

## 2020-10-29 LAB — BLADDER SCAN AMB NON-IMAGING

## 2020-10-29 NOTE — Patient Instructions (Signed)

## 2020-10-29 NOTE — Progress Notes (Signed)
Catheter Removal  Patient is present today for a catheter removal.  9ml of water was drained from the balloon. A 16FR foley cath was removed from the bladder no complications were noted . Patient tolerated well.  Performed by: Moya Duan, CMA  Follow up/ Additional notes: RTC this afternoon for PVR.  

## 2020-10-31 DIAGNOSIS — E785 Hyperlipidemia, unspecified: Secondary | ICD-10-CM | POA: Diagnosis not present

## 2020-10-31 DIAGNOSIS — E119 Type 2 diabetes mellitus without complications: Secondary | ICD-10-CM | POA: Diagnosis not present

## 2020-10-31 DIAGNOSIS — I2510 Atherosclerotic heart disease of native coronary artery without angina pectoris: Secondary | ICD-10-CM | POA: Diagnosis not present

## 2020-10-31 DIAGNOSIS — M21371 Foot drop, right foot: Secondary | ICD-10-CM | POA: Diagnosis not present

## 2020-10-31 DIAGNOSIS — Z466 Encounter for fitting and adjustment of urinary device: Secondary | ICD-10-CM | POA: Diagnosis not present

## 2020-10-31 DIAGNOSIS — I10 Essential (primary) hypertension: Secondary | ICD-10-CM | POA: Diagnosis not present

## 2020-10-31 DIAGNOSIS — R339 Retention of urine, unspecified: Secondary | ICD-10-CM | POA: Diagnosis not present

## 2020-10-31 DIAGNOSIS — G40909 Epilepsy, unspecified, not intractable, without status epilepticus: Secondary | ICD-10-CM | POA: Diagnosis not present

## 2020-10-31 DIAGNOSIS — G4733 Obstructive sleep apnea (adult) (pediatric): Secondary | ICD-10-CM | POA: Diagnosis not present

## 2020-11-03 DIAGNOSIS — R339 Retention of urine, unspecified: Secondary | ICD-10-CM | POA: Diagnosis not present

## 2020-11-04 DIAGNOSIS — E119 Type 2 diabetes mellitus without complications: Secondary | ICD-10-CM | POA: Diagnosis not present

## 2020-11-04 DIAGNOSIS — G4733 Obstructive sleep apnea (adult) (pediatric): Secondary | ICD-10-CM | POA: Diagnosis not present

## 2020-11-04 DIAGNOSIS — I2510 Atherosclerotic heart disease of native coronary artery without angina pectoris: Secondary | ICD-10-CM | POA: Diagnosis not present

## 2020-11-04 DIAGNOSIS — G40909 Epilepsy, unspecified, not intractable, without status epilepticus: Secondary | ICD-10-CM | POA: Diagnosis not present

## 2020-11-04 DIAGNOSIS — Z466 Encounter for fitting and adjustment of urinary device: Secondary | ICD-10-CM | POA: Diagnosis not present

## 2020-11-04 DIAGNOSIS — R339 Retention of urine, unspecified: Secondary | ICD-10-CM | POA: Diagnosis not present

## 2020-11-04 DIAGNOSIS — E785 Hyperlipidemia, unspecified: Secondary | ICD-10-CM | POA: Diagnosis not present

## 2020-11-04 DIAGNOSIS — I10 Essential (primary) hypertension: Secondary | ICD-10-CM | POA: Diagnosis not present

## 2020-11-04 DIAGNOSIS — M21371 Foot drop, right foot: Secondary | ICD-10-CM | POA: Diagnosis not present

## 2020-11-05 ENCOUNTER — Encounter: Payer: Self-pay | Admitting: Urology

## 2020-11-05 ENCOUNTER — Other Ambulatory Visit: Payer: Self-pay

## 2020-11-05 ENCOUNTER — Ambulatory Visit (INDEPENDENT_AMBULATORY_CARE_PROVIDER_SITE_OTHER): Payer: Medicare HMO | Admitting: Urology

## 2020-11-05 VITALS — BP 120/69 | HR 80 | Ht 64.0 in | Wt 164.0 lb

## 2020-11-05 DIAGNOSIS — R339 Retention of urine, unspecified: Secondary | ICD-10-CM

## 2020-11-05 NOTE — Progress Notes (Signed)
   11/07/20  CC:  Chief Complaint  Patient presents with   Cysto    Indications: Recurrent urinary retention; see my note 10/22/2020 Failed voiding trials x2 and currently on CIC; voiding small amounts between caths  HPI: Edward Thompson has no complaints today  NED. A&Ox3.   No respiratory distress   Abd soft, NT, ND Normal phallus with bilateral descended testicles  Cystoscopy Procedure Note  Patient identification was confirmed, informed consent was obtained, and patient was prepped using Betadine solution.  Lidocaine jelly was administered per urethral meatus.     Pre-Procedure: - Inspection reveals a normal caliber urethral meatus.  Procedure: The flexible cystoscope was introduced without difficulty - No urethral strictures/lesions are present. -  Mild lateral lobe enlargement   - Normal bladder neck - Bilateral ureteral orifices identified - Bladder mucosa  reveals no ulcers, tumors, or lesions - No bladder stones - No trabeculation  Retroflexion shows no lesion or intravesical median lobe   Post-Procedure: - Patient tolerated the procedure well  Assessment/ Plan: Urinary retention with only mild lateral lobe enlargement on cystoscopy He is interested in outlet procedure however based on cystoscopy would recommend urodynamic study We discussed this will be performed at [facility]   Riki Altes, MD

## 2020-11-06 DIAGNOSIS — R339 Retention of urine, unspecified: Secondary | ICD-10-CM | POA: Diagnosis not present

## 2020-11-06 DIAGNOSIS — M21371 Foot drop, right foot: Secondary | ICD-10-CM | POA: Diagnosis not present

## 2020-11-06 DIAGNOSIS — E785 Hyperlipidemia, unspecified: Secondary | ICD-10-CM | POA: Diagnosis not present

## 2020-11-06 DIAGNOSIS — Z466 Encounter for fitting and adjustment of urinary device: Secondary | ICD-10-CM | POA: Diagnosis not present

## 2020-11-06 DIAGNOSIS — G40909 Epilepsy, unspecified, not intractable, without status epilepticus: Secondary | ICD-10-CM | POA: Diagnosis not present

## 2020-11-06 DIAGNOSIS — G4733 Obstructive sleep apnea (adult) (pediatric): Secondary | ICD-10-CM | POA: Diagnosis not present

## 2020-11-06 DIAGNOSIS — E119 Type 2 diabetes mellitus without complications: Secondary | ICD-10-CM | POA: Diagnosis not present

## 2020-11-06 DIAGNOSIS — I2510 Atherosclerotic heart disease of native coronary artery without angina pectoris: Secondary | ICD-10-CM | POA: Diagnosis not present

## 2020-11-06 DIAGNOSIS — I10 Essential (primary) hypertension: Secondary | ICD-10-CM | POA: Diagnosis not present

## 2020-11-11 DIAGNOSIS — I2510 Atherosclerotic heart disease of native coronary artery without angina pectoris: Secondary | ICD-10-CM | POA: Diagnosis not present

## 2020-11-11 DIAGNOSIS — I10 Essential (primary) hypertension: Secondary | ICD-10-CM | POA: Diagnosis not present

## 2020-11-11 DIAGNOSIS — R339 Retention of urine, unspecified: Secondary | ICD-10-CM | POA: Diagnosis not present

## 2020-11-11 DIAGNOSIS — G4733 Obstructive sleep apnea (adult) (pediatric): Secondary | ICD-10-CM | POA: Diagnosis not present

## 2020-11-11 DIAGNOSIS — M21371 Foot drop, right foot: Secondary | ICD-10-CM | POA: Diagnosis not present

## 2020-11-11 DIAGNOSIS — G40909 Epilepsy, unspecified, not intractable, without status epilepticus: Secondary | ICD-10-CM | POA: Diagnosis not present

## 2020-11-11 DIAGNOSIS — E119 Type 2 diabetes mellitus without complications: Secondary | ICD-10-CM | POA: Diagnosis not present

## 2020-11-11 DIAGNOSIS — Z466 Encounter for fitting and adjustment of urinary device: Secondary | ICD-10-CM | POA: Diagnosis not present

## 2020-11-11 DIAGNOSIS — E785 Hyperlipidemia, unspecified: Secondary | ICD-10-CM | POA: Diagnosis not present

## 2020-11-17 DIAGNOSIS — E119 Type 2 diabetes mellitus without complications: Secondary | ICD-10-CM | POA: Diagnosis not present

## 2020-11-17 DIAGNOSIS — Z466 Encounter for fitting and adjustment of urinary device: Secondary | ICD-10-CM | POA: Diagnosis not present

## 2020-11-17 DIAGNOSIS — I10 Essential (primary) hypertension: Secondary | ICD-10-CM | POA: Diagnosis not present

## 2020-11-17 DIAGNOSIS — G40909 Epilepsy, unspecified, not intractable, without status epilepticus: Secondary | ICD-10-CM | POA: Diagnosis not present

## 2020-11-17 DIAGNOSIS — E785 Hyperlipidemia, unspecified: Secondary | ICD-10-CM | POA: Diagnosis not present

## 2020-11-17 DIAGNOSIS — G4733 Obstructive sleep apnea (adult) (pediatric): Secondary | ICD-10-CM | POA: Diagnosis not present

## 2020-11-17 DIAGNOSIS — M21371 Foot drop, right foot: Secondary | ICD-10-CM | POA: Diagnosis not present

## 2020-11-17 DIAGNOSIS — R339 Retention of urine, unspecified: Secondary | ICD-10-CM | POA: Diagnosis not present

## 2020-11-17 DIAGNOSIS — I2510 Atherosclerotic heart disease of native coronary artery without angina pectoris: Secondary | ICD-10-CM | POA: Diagnosis not present

## 2020-11-21 ENCOUNTER — Ambulatory Visit: Payer: Self-pay | Admitting: Urology

## 2020-11-25 DIAGNOSIS — E785 Hyperlipidemia, unspecified: Secondary | ICD-10-CM | POA: Diagnosis not present

## 2020-11-25 DIAGNOSIS — G40909 Epilepsy, unspecified, not intractable, without status epilepticus: Secondary | ICD-10-CM | POA: Diagnosis not present

## 2020-11-25 DIAGNOSIS — E119 Type 2 diabetes mellitus without complications: Secondary | ICD-10-CM | POA: Diagnosis not present

## 2020-11-25 DIAGNOSIS — G4733 Obstructive sleep apnea (adult) (pediatric): Secondary | ICD-10-CM | POA: Diagnosis not present

## 2020-11-25 DIAGNOSIS — R339 Retention of urine, unspecified: Secondary | ICD-10-CM | POA: Diagnosis not present

## 2020-11-25 DIAGNOSIS — Z466 Encounter for fitting and adjustment of urinary device: Secondary | ICD-10-CM | POA: Diagnosis not present

## 2020-11-25 DIAGNOSIS — I2510 Atherosclerotic heart disease of native coronary artery without angina pectoris: Secondary | ICD-10-CM | POA: Diagnosis not present

## 2020-11-25 DIAGNOSIS — I10 Essential (primary) hypertension: Secondary | ICD-10-CM | POA: Diagnosis not present

## 2020-11-25 DIAGNOSIS — M21371 Foot drop, right foot: Secondary | ICD-10-CM | POA: Diagnosis not present

## 2020-12-02 DIAGNOSIS — K625 Hemorrhage of anus and rectum: Secondary | ICD-10-CM | POA: Diagnosis not present

## 2020-12-02 DIAGNOSIS — R634 Abnormal weight loss: Secondary | ICD-10-CM | POA: Diagnosis not present

## 2020-12-04 DIAGNOSIS — I10 Essential (primary) hypertension: Secondary | ICD-10-CM | POA: Diagnosis not present

## 2020-12-04 DIAGNOSIS — E785 Hyperlipidemia, unspecified: Secondary | ICD-10-CM | POA: Diagnosis not present

## 2020-12-04 DIAGNOSIS — I219 Acute myocardial infarction, unspecified: Secondary | ICD-10-CM | POA: Diagnosis not present

## 2020-12-04 DIAGNOSIS — I2510 Atherosclerotic heart disease of native coronary artery without angina pectoris: Secondary | ICD-10-CM | POA: Diagnosis not present

## 2020-12-10 ENCOUNTER — Emergency Department: Payer: Medicare HMO

## 2020-12-10 ENCOUNTER — Inpatient Hospital Stay
Admission: EM | Admit: 2020-12-10 | Discharge: 2020-12-13 | DRG: 872 | Disposition: A | Discharge status: Home / Self Care | Payer: Medicare HMO | Attending: Internal Medicine | Admitting: Internal Medicine

## 2020-12-10 ENCOUNTER — Other Ambulatory Visit: Payer: Self-pay

## 2020-12-10 DIAGNOSIS — N136 Pyonephrosis: Secondary | ICD-10-CM | POA: Diagnosis not present

## 2020-12-10 DIAGNOSIS — Z789 Other specified health status: Secondary | ICD-10-CM

## 2020-12-10 DIAGNOSIS — I10 Essential (primary) hypertension: Secondary | ICD-10-CM | POA: Diagnosis present

## 2020-12-10 DIAGNOSIS — E785 Hyperlipidemia, unspecified: Secondary | ICD-10-CM | POA: Diagnosis present

## 2020-12-10 DIAGNOSIS — Z8249 Family history of ischemic heart disease and other diseases of the circulatory system: Secondary | ICD-10-CM | POA: Diagnosis not present

## 2020-12-10 DIAGNOSIS — R0689 Other abnormalities of breathing: Secondary | ICD-10-CM | POA: Diagnosis not present

## 2020-12-10 DIAGNOSIS — Z7984 Long term (current) use of oral hypoglycemic drugs: Secondary | ICD-10-CM | POA: Diagnosis not present

## 2020-12-10 DIAGNOSIS — K8020 Calculus of gallbladder without cholecystitis without obstruction: Secondary | ICD-10-CM | POA: Diagnosis not present

## 2020-12-10 DIAGNOSIS — Z955 Presence of coronary angioplasty implant and graft: Secondary | ICD-10-CM

## 2020-12-10 DIAGNOSIS — N3000 Acute cystitis without hematuria: Secondary | ICD-10-CM | POA: Diagnosis not present

## 2020-12-10 DIAGNOSIS — N134 Hydroureter: Secondary | ICD-10-CM

## 2020-12-10 DIAGNOSIS — I959 Hypotension, unspecified: Secondary | ICD-10-CM | POA: Diagnosis not present

## 2020-12-10 DIAGNOSIS — G40909 Epilepsy, unspecified, not intractable, without status epilepticus: Secondary | ICD-10-CM | POA: Diagnosis not present

## 2020-12-10 DIAGNOSIS — Z79899 Other long term (current) drug therapy: Secondary | ICD-10-CM | POA: Diagnosis not present

## 2020-12-10 DIAGNOSIS — R509 Fever, unspecified: Secondary | ICD-10-CM | POA: Diagnosis not present

## 2020-12-10 DIAGNOSIS — G4733 Obstructive sleep apnea (adult) (pediatric): Secondary | ICD-10-CM | POA: Diagnosis present

## 2020-12-10 DIAGNOSIS — Z743 Need for continuous supervision: Secondary | ICD-10-CM | POA: Diagnosis not present

## 2020-12-10 DIAGNOSIS — Z20822 Contact with and (suspected) exposure to covid-19: Secondary | ICD-10-CM | POA: Diagnosis present

## 2020-12-10 DIAGNOSIS — Z7902 Long term (current) use of antithrombotics/antiplatelets: Secondary | ICD-10-CM

## 2020-12-10 DIAGNOSIS — Z7982 Long term (current) use of aspirin: Secondary | ICD-10-CM | POA: Diagnosis not present

## 2020-12-10 DIAGNOSIS — I252 Old myocardial infarction: Secondary | ICD-10-CM

## 2020-12-10 DIAGNOSIS — I2510 Atherosclerotic heart disease of native coronary artery without angina pectoris: Secondary | ICD-10-CM | POA: Diagnosis present

## 2020-12-10 DIAGNOSIS — N390 Urinary tract infection, site not specified: Secondary | ICD-10-CM

## 2020-12-10 DIAGNOSIS — R6889 Other general symptoms and signs: Secondary | ICD-10-CM | POA: Diagnosis not present

## 2020-12-10 DIAGNOSIS — N3001 Acute cystitis with hematuria: Secondary | ICD-10-CM | POA: Diagnosis not present

## 2020-12-10 DIAGNOSIS — A419 Sepsis, unspecified organism: Secondary | ICD-10-CM | POA: Diagnosis not present

## 2020-12-10 DIAGNOSIS — R339 Retention of urine, unspecified: Secondary | ICD-10-CM | POA: Diagnosis not present

## 2020-12-10 DIAGNOSIS — E119 Type 2 diabetes mellitus without complications: Secondary | ICD-10-CM | POA: Diagnosis not present

## 2020-12-10 DIAGNOSIS — I700 Atherosclerosis of aorta: Secondary | ICD-10-CM | POA: Diagnosis not present

## 2020-12-10 LAB — COMPREHENSIVE METABOLIC PANEL
ALT: 19 U/L (ref 0–44)
AST: 23 U/L (ref 15–41)
Albumin: 3.8 g/dL (ref 3.5–5.0)
Alkaline Phosphatase: 88 U/L (ref 38–126)
Anion gap: 11 (ref 5–15)
BUN: 17 mg/dL (ref 8–23)
CO2: 23 mmol/L (ref 22–32)
Calcium: 8.8 mg/dL — ABNORMAL LOW (ref 8.9–10.3)
Chloride: 99 mmol/L (ref 98–111)
Creatinine, Ser: 0.61 mg/dL (ref 0.61–1.24)
GFR, Estimated: >60 mL/min (ref >60)
Glucose, Bld: 126 mg/dL — ABNORMAL HIGH (ref 70–99)
Potassium: 3.6 mmol/L (ref 3.5–5.1)
Sodium: 133 mmol/L — ABNORMAL LOW (ref 135–145)
Total Bilirubin: 0.6 mg/dL (ref 0.3–1.2)
Total Protein: 7.1 g/dL (ref 6.5–8.1)

## 2020-12-10 LAB — CBC WITH DIFFERENTIAL/PLATELET
Abs Immature Granulocytes: 0.05 10*3/uL (ref 0.00–0.07)
Basophils Absolute: 0 10*3/uL (ref 0.0–0.1)
Basophils Relative: 0 %
Eosinophils Absolute: 0 10*3/uL (ref 0.0–0.5)
Eosinophils Relative: 0 %
HCT: 35 % — ABNORMAL LOW (ref 39.0–52.0)
Hemoglobin: 12.1 g/dL — ABNORMAL LOW (ref 13.0–17.0)
Immature Granulocytes: 0 %
Lymphocytes Relative: 5 %
Lymphs Abs: 0.6 10*3/uL — ABNORMAL LOW (ref 0.7–4.0)
MCH: 31.3 pg (ref 26.0–34.0)
MCHC: 34.6 g/dL (ref 30.0–36.0)
MCV: 90.7 fL (ref 80.0–100.0)
Monocytes Absolute: 0.3 10*3/uL (ref 0.1–1.0)
Monocytes Relative: 2 %
Neutro Abs: 11.4 10*3/uL — ABNORMAL HIGH (ref 1.7–7.7)
Neutrophils Relative %: 93 %
Platelets: 155 10*3/uL (ref 150–400)
RBC: 3.86 MIL/uL — ABNORMAL LOW (ref 4.22–5.81)
RDW: 13.8 % (ref 11.5–15.5)
WBC: 12.4 10*3/uL — ABNORMAL HIGH (ref 4.0–10.5)
nRBC: 0 % (ref 0.0–0.2)

## 2020-12-10 LAB — URINALYSIS, COMPLETE (UACMP) WITH MICROSCOPIC
Bilirubin Urine: NEGATIVE
Glucose, UA: NEGATIVE mg/dL
Ketones, ur: NEGATIVE mg/dL
Nitrite: POSITIVE — AB
Protein, ur: NEGATIVE mg/dL
Specific Gravity, Urine: 1.03 (ref 1.005–1.030)
WBC, UA: >50 WBC/hpf — ABNORMAL HIGH (ref 0–5)
pH: 7 (ref 5.0–8.0)

## 2020-12-10 LAB — RESP PANEL BY RT-PCR (FLU A&B, COVID) ARPGX2
Influenza A by PCR: NEGATIVE
Influenza B by PCR: NEGATIVE
SARS Coronavirus 2 by RT PCR: NEGATIVE

## 2020-12-10 LAB — PROTIME-INR
INR: 1.3 — ABNORMAL HIGH (ref 0.8–1.2)
Prothrombin Time: 16.4 seconds — ABNORMAL HIGH (ref 11.4–15.2)

## 2020-12-10 LAB — LACTIC ACID, PLASMA
Lactic Acid, Venous: 1.1 mmol/L (ref 0.5–1.9)
Lactic Acid, Venous: 1.3 mmol/L (ref 0.5–1.9)
Lactic Acid, Venous: 2.3 mmol/L (ref 0.5–1.9)

## 2020-12-10 MED ORDER — ONDANSETRON HCL 4 MG PO TABS: 4.0000 mg | ORAL_TABLET | Freq: Four times a day (QID) | ORAL | Status: DC | PRN

## 2020-12-10 MED ORDER — ISOSORBIDE MONONITRATE ER 60 MG PO TB24
30.0000 mg | ORAL_TABLET | Freq: Every day | ORAL | Status: DC
  Administered 2020-12-11 – 2020-12-12 (×2): 30 mg via ORAL
  Filled 2020-12-10 (×3): qty 1

## 2020-12-10 MED ORDER — POLYETHYLENE GLYCOL 3350 17 G PO PACK
17.0000 g | PACK | Freq: Every day | ORAL | Status: DC
  Administered 2020-12-11 – 2020-12-13 (×3): 17 g via ORAL
  Filled 2020-12-10 (×3): qty 1

## 2020-12-10 MED ORDER — ASPIRIN EC 81 MG PO TBEC
81.0000 mg | DELAYED_RELEASE_TABLET | Freq: Every day | ORAL | Status: DC
  Administered 2020-12-11 – 2020-12-12 (×2): 81 mg via ORAL
  Filled 2020-12-10 (×2): qty 1

## 2020-12-10 MED ORDER — SODIUM CHLORIDE 0.9 % IV BOLUS
1000.0000 mL | Freq: Once | INTRAVENOUS | Status: AC
  Administered 2020-12-10: 1000 mL via INTRAVENOUS

## 2020-12-10 MED ORDER — ENOXAPARIN SODIUM 40 MG/0.4ML IJ SOSY
40.0000 mg | PREFILLED_SYRINGE | INTRAMUSCULAR | Status: DC
  Administered 2020-12-11 – 2020-12-12 (×2): 40 mg via SUBCUTANEOUS
  Filled 2020-12-10 (×2): qty 0.4

## 2020-12-10 MED ORDER — ACETAMINOPHEN 325 MG PO TABS: 650.0000 mg | ORAL_TABLET | Freq: Four times a day (QID) | ORAL | Status: DC | PRN

## 2020-12-10 MED ORDER — VANCOMYCIN HCL IN DEXTROSE 1-5 GM/200ML-% IV SOLN: 1000.0000 mg | Freq: Once | INTRAVENOUS | Status: DC

## 2020-12-10 MED ORDER — SENNOSIDES-DOCUSATE SODIUM 8.6-50 MG PO TABS: 1.0000 | ORAL_TABLET | Freq: Every evening | ORAL | Status: DC | PRN

## 2020-12-10 MED ORDER — LACTATED RINGERS IV SOLN: INTRAVENOUS | Status: AC

## 2020-12-10 MED ORDER — PHENOBARBITAL 32.4 MG PO TABS
129.6000 mg | ORAL_TABLET | Freq: Every day | ORAL | Status: DC
  Administered 2020-12-11 – 2020-12-12 (×2): 129.6 mg via ORAL
  Filled 2020-12-10 (×2): qty 4

## 2020-12-10 MED ORDER — VANCOMYCIN HCL 1500 MG/300ML IV SOLN
1500.0000 mg | Freq: Once | INTRAVENOUS | Status: AC
  Administered 2020-12-10: 1500 mg via INTRAVENOUS
  Filled 2020-12-10: qty 300

## 2020-12-10 MED ORDER — INSULIN ASPART 100 UNIT/ML IJ SOLN: 0.0000 [IU] | Freq: Every day | INTRAMUSCULAR | Status: DC

## 2020-12-10 MED ORDER — EZETIMIBE 10 MG PO TABS
10.0000 mg | ORAL_TABLET | Freq: Every day | ORAL | Status: DC
  Administered 2020-12-11 – 2020-12-13 (×3): 10 mg via ORAL
  Filled 2020-12-10 (×3): qty 1

## 2020-12-10 MED ORDER — TAMSULOSIN HCL 0.4 MG PO CAPS
0.4000 mg | ORAL_CAPSULE | Freq: Every day | ORAL | Status: DC
  Administered 2020-12-11 – 2020-12-13 (×3): 0.4 mg via ORAL
  Filled 2020-12-10 (×3): qty 1

## 2020-12-10 MED ORDER — ROSUVASTATIN CALCIUM 20 MG PO TABS
40.0000 mg | ORAL_TABLET | Freq: Every day | ORAL | Status: DC
  Administered 2020-12-11 – 2020-12-12 (×2): 40 mg via ORAL
  Filled 2020-12-10: qty 4
  Filled 2020-12-10 (×3): qty 2
  Filled 2020-12-10: qty 4

## 2020-12-10 MED ORDER — IOHEXOL 350 MG/ML SOLN
100.0000 mL | Freq: Once | INTRAVENOUS | Status: AC | PRN
  Administered 2020-12-10: 100 mL via INTRAVENOUS

## 2020-12-10 MED ORDER — METOPROLOL SUCCINATE ER 50 MG PO TB24
50.0000 mg | ORAL_TABLET | Freq: Every day | ORAL | Status: DC
  Administered 2020-12-11: 50 mg via ORAL
  Filled 2020-12-10: qty 1

## 2020-12-10 MED ORDER — ACETAMINOPHEN 650 MG RE SUPP: 650.0000 mg | Freq: Four times a day (QID) | RECTAL | Status: DC | PRN

## 2020-12-10 MED ORDER — METRONIDAZOLE 500 MG/100ML IV SOLN
500.0000 mg | Freq: Once | INTRAVENOUS | Status: AC
  Administered 2020-12-10: 500 mg via INTRAVENOUS
  Filled 2020-12-10: qty 100

## 2020-12-10 MED ORDER — CLOPIDOGREL BISULFATE 75 MG PO TABS
75.0000 mg | ORAL_TABLET | Freq: Every day | ORAL | Status: DC
  Administered 2020-12-11 – 2020-12-12 (×2): 75 mg via ORAL
  Filled 2020-12-10 (×2): qty 1

## 2020-12-10 MED ORDER — PHENYTOIN SODIUM EXTENDED 100 MG PO CAPS
300.0000 mg | ORAL_CAPSULE | Freq: Every day | ORAL | Status: DC
  Administered 2020-12-11 – 2020-12-12 (×2): 300 mg via ORAL
  Filled 2020-12-10 (×3): qty 3

## 2020-12-10 MED ORDER — SODIUM CHLORIDE 0.9 % IV SOLN
2.0000 g | Freq: Once | INTRAVENOUS | Status: AC
  Administered 2020-12-10: 2 g via INTRAVENOUS
  Filled 2020-12-10: qty 2

## 2020-12-10 MED ORDER — NITROGLYCERIN 0.4 MG SL SUBL: 0.4000 mg | SUBLINGUAL_TABLET | SUBLINGUAL | Status: DC | PRN

## 2020-12-10 MED ORDER — INSULIN ASPART 100 UNIT/ML IJ SOLN
0.0000 [IU] | Freq: Three times a day (TID) | INTRAMUSCULAR | Status: DC
  Administered 2020-12-12: 5 [IU] via SUBCUTANEOUS
  Administered 2020-12-12: 2 [IU] via SUBCUTANEOUS
  Administered 2020-12-13: 5 [IU] via SUBCUTANEOUS
  Administered 2020-12-13: 2 [IU] via SUBCUTANEOUS
  Filled 2020-12-10 (×4): qty 1

## 2020-12-10 MED ORDER — HYDROCODONE-ACETAMINOPHEN 5-325 MG PO TABS: 1.0000 | ORAL_TABLET | ORAL | Status: DC | PRN

## 2020-12-10 MED ORDER — ONDANSETRON HCL 4 MG/2ML IJ SOLN: 4.0000 mg | Freq: Four times a day (QID) | INTRAMUSCULAR | Status: DC | PRN

## 2020-12-10 NOTE — Sepsis Progress Note (Signed)
Elink following Code Sepsis. 
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
## 2020-12-10 NOTE — Consult Note (Signed)
CODE SEPSIS - PHARMACY COMMUNICATION  **Broad Spectrum Antibiotics should be administered within 1 hour of Sepsis diagnosis**  Time Code Sepsis Called/Page Received: 2014  Antibiotics Ordered: 2014  Time of 1st antibiotic administration: 2031       Sharen Hones ,PharmD, BCPS Clinical Pharmacist  12/10/2020  8:16 PM

## 2020-12-10 NOTE — Consult Note (Signed)
PHARMACY -  BRIEF ANTIBIOTIC NOTE   Pharmacy has received consult(s) for cefepime and vancomycin from an ED provider.  The patient's profile has been reviewed for ht/wt/allergies/indication/available labs.    One time order(s) placed for  Cefepime 2 gram Vancomycin 1500 mg   Further antibiotics/pharmacy consults should be ordered by admitting physician if indicated.                       Thank you, Sharen Hones 12/10/2020  8:17 PM

## 2020-12-10 NOTE — H&P (Signed)
History and Physical    Edward Thompson BWG:[id number] DOB: April 29, 1951 DOA: 12/10/2020  PCP: Sherron Monday, MD   Patient coming from: home  I have personally briefly reviewed patient's old medical records in [facility]  Chief Complaint: fever and malaise  HPI: Edward Thompson is a 70 y.o. male with medical history significant for DM, CAD, C-spine fracture with viral meningitis, OSA, seizure disorder, urinary retention who self catheterizes, hospitalized a month ago for SBO treated conservatively, who presents to the ED with a 1 day history of fever and malaise.  He denies cough, chest pain or shortness of breath and denies abdominal pain.  He has had no nausea or vomiting or diarrhea.  ED course: On arrival temperature 103.9 and tachycardia at 114, BP 123/68 with O2 sat 98% on room air Blood work with WBC 12,000 and lactic acid 2.3-1.3.  Urinalysis with positive nitrite large leukocyte esterase and rare bacteria.  Other blood work mostly unremarkable.  COVID-negative  EKG, personally viewed and interpreted: Sinus tachycardia at 104 with nonspecific ST-T wave changes  Imaging: CT abdomen and pelvis showing mild bilateral hydroureteral and findings possibly due to ascending UTI, no PE, mild bilateral lower lobe central airways thickening without consolidative pneumonia and other nonacute findings chest x-ray with possible left lung base infiltrate  Patient started on  IV fluids and started initially on antibiotics for sepsis of unknown source with cefepime metronidazole and vancomycin.  Hospitalist consulted for admission.    Review of Systems: As per HPI otherwise all other systems on review of systems negative.    Past Medical History:  Diagnosis Date   CAD (coronary artery disease)    Carotid stenosis, left 08/2017   Diabetes mellitus without complication (HCC)    Fracture of neck (HCC) 2008   fell off a roof. required halo x 4 months. also fractured alot of vertebrae    Heart attack (HCC) 2015   Heart disease    Hepatitis    6th grade    Hyperlipidemia    Hypertension    Myocardial infarction acute (HCC) 2015   Seizures (HCC)    taking phenobarbitol and dilantin. LAST SEIZURE WAS 2016. well controlled on meds   Sleep apnea    USES CPAP   Syncope 2019   Vertigo    Viral meningitis     Past Surgical History:  Procedure Laterality Date   CARDIAC SURGERY     CORONARY ANGIOPLASTY     PATIENT UNAWARE OF THIS   CORONARY ARTERY BYPASS GRAFT  2015   ENDARTERECTOMY Left 08/19/2017   Procedure: ENDARTERECTOMY CAROTID;  Surgeon: Renford Dills, MD;  Location: [facility] ORS;  Service: Vascular;  Laterality: Left;   HERNIA REPAIR Right 1985   inguinal     reports that he has never smoked. He has never used smokeless tobacco. He reports that he does not drink alcohol and does not use drugs.  No Known Allergies  Family History  Problem Relation Age of Onset   Heart disease Mother    Heart attack Father       Prior to Admission medications   Medication Sig Start Date End Date Taking? Authorizing Provider  aspirin EC 81 MG tablet Take 81 mg by mouth at bedtime.    Yes [provider]  clopidogrel (PLAVIX) 75 MG tablet Take 75 mg by mouth at bedtime.  01/28/15  Yes [provider]  ezetimibe (ZETIA) 10 MG tablet Take 10 mg by mouth daily.  11/27/18  Yes [provider]  isosorbide mononitrate (IMDUR) 30 MG 24 hr tablet Take 30 mg by mouth at bedtime.  04/29/17  Yes [provider]  meclizine (ANTIVERT) 25 MG tablet Take 50 mg by mouth 2 (two) times daily as needed for dizziness.  05/27/14  Yes [provider]  metFORMIN (GLUCOPHAGE) 500 MG tablet Take 500 mg by mouth daily.  04/28/17  Yes [provider]  metoprolol succinate (TOPROL-XL) 50 MG 24 hr tablet Take 50 mg by mouth daily. Take with or immediately following a meal IN THE MORNING   Yes [provider]  Multiple Vitamin (MULTIVITAMIN)  tablet Take 1 tablet by mouth daily.   Yes [provider]  PHENobarbital (LUMINAL) 64.8 MG tablet Take 2 tablets (129.6 mg total) by mouth at bedtime. 08/25/17  Yes Lorenso QuarryLeach, Shannon, NP  phenytoin (DILANTIN) 100 MG ER capsule Take 300 mg by mouth at bedtime.    Yes [provider]  polyethylene glycol (MIRALAX / GLYCOLAX) 17 g packet Take 17 g by mouth daily. 10/11/20  Yes Hollice EspyKrishnan, Sendil K, MD  rosuvastatin (CRESTOR) 40 MG tablet Take 40 mg by mouth at bedtime.    Yes [provider]  tamsulosin (FLOMAX) 0.4 MG CAPS capsule Take 1 capsule (0.4 mg total) by mouth daily. 10/11/20  Yes Hollice EspyKrishnan, Sendil K, MD  nitroGLYCERIN (NITROSTAT) 0.4 MG SL tablet Place 0.4 mg under the tongue every 5 (five) minutes as needed for chest pain.  08/22/13   [provider]    Physical Exam: Vitals:   12/10/20 2200 12/10/20 2230 12/10/20 2300 12/10/20 2330  BP: 128/68 114/60 126/65 117/63  Pulse: 95 94 95 91  Resp: 17 15 15 15   Temp:      TempSrc:      SpO2: 100% 99% 100% 100%  Weight:      Height:         Vitals:   12/10/20 2200 12/10/20 2230 12/10/20 2300 12/10/20 2330  BP: 128/68 114/60 126/65 117/63  Pulse: 95 94 95 91  Resp: 17 15 15 15   Temp:      TempSrc:      SpO2: 100% 99% 100% 100%  Weight:      Height:          Constitutional: Alert and oriented x 3 . Not in any apparent distress HEENT:      Head: Normocephalic and atraumatic.         Eyes: PERLA, EOMI, Conjunctivae are normal. Sclera is non-icteric.       Mouth/Throat: Mucous membranes are moist.       Neck: Supple with no signs of meningismus. Cardiovascular: Regular rate and rhythm. No murmurs, gallops, or rubs. 2+ symmetrical distal pulses are present . No JVD. 2-3+LE edema Respiratory: Respiratory effort normal .Lungs sounds clear bilaterally. No wheezes, crackles, or rhonchi.  Gastrointestinal: Soft, non tender, and non distended with positive bowel sounds.  Genitourinary: No CVA  tenderness.foley Musculoskeletal: Nontender with normal range of motion in all extremities. No cyanosis, or erythema of extremities. Neurologic:  Face is symmetric. Moving all extremities. No gross focal neurologic deficits . Skin: Skin is warm, dry.  No rash or ulcers Psychiatric: Mood and affect are normal    Labs on Admission: I have personally reviewed following labs and imaging studies  CBC: Recent Labs  Lab 12/10/20 1936  WBC 12.4*  NEUTROABS 11.4*  HGB 12.1*  HCT 35.0*  MCV 90.7  PLT 155   Basic Metabolic Panel: Recent Labs  Lab 12/10/20 1936  NA 133*  K 3.6  CL 99  CO2 23  GLUCOSE 126*  BUN 17  CREATININE 0.61  CALCIUM 8.8*   GFR: Estimated Creatinine Clearance: 79.4 mL/min (by C-G formula based on SCr of 0.61 mg/dL). Liver Function Tests: Recent Labs  Lab 12/10/20 1936  AST 23  ALT 19  ALKPHOS 88  BILITOT 0.6  PROT 7.1  ALBUMIN 3.8   No results for input(s): LIPASE, AMYLASE in the last 168 hours. No results for input(s): AMMONIA in the last 168 hours. Coagulation Profile: Recent Labs  Lab 12/10/20 1936  INR 1.3*   Cardiac Enzymes: No results for input(s): CKTOTAL, CKMB, CKMBINDEX, TROPONINI in the last 168 hours. BNP (last 3 results) No results for input(s): PROBNP in the last 8760 hours. HbA1C: No results for input(s): HGBA1C in the last 72 hours. CBG: No results for input(s): GLUCAP in the last 168 hours. Lipid Profile: No results for input(s): CHOL, HDL, LDLCALC, TRIG, CHOLHDL, LDLDIRECT in the last 72 hours. Thyroid Function Tests: No results for input(s): TSH, T4TOTAL, FREET4, T3FREE, THYROIDAB in the last 72 hours. Anemia Panel: No results for input(s): VITAMINB12, FOLATE, FERRITIN, TIBC, IRON, RETICCTPCT in the last 72 hours. Urine analysis:    Component Value Date/Time   COLORURINE YELLOW (A) 12/10/2020 1929   APPEARANCEUR CLOUDY (A) 12/10/2020 1929   LABSPEC 1.030 12/10/2020 1929   PHURINE 7.0 12/10/2020 1929   GLUCOSEU  NEGATIVE 12/10/2020 1929   HGBUR SMALL (A) 12/10/2020 1929   BILIRUBINUR NEGATIVE 12/10/2020 1929   KETONESUR NEGATIVE 12/10/2020 1929   PROTEINUR NEGATIVE 12/10/2020 1929   NITRITE POSITIVE (A) 12/10/2020 1929   LEUKOCYTESUR LARGE (A) 12/10/2020 1929    Radiological Exams on Admission: DG Chest 1 View  Result Date: 12/10/2020 CLINICAL DATA:  70 year old male with generalized pain and elevated temperature. EXAM: CHEST  1 VIEW COMPARISON:  Chest radiograph dated 12/08/2018. FINDINGS: Left lung base density may represent atelectasis or infiltrate. Trace left pleural effusion may be present. The cardiac silhouette is within limits. Atherosclerotic calcification of the aortic arch. There is lucency under the right hemidiaphragm which may be artifactual and related to interposition of the air-filled colon. Pneumoperitoneum is not excluded. Further evaluation with CT is recommended. A sub pulmonic pneumothorax is less likely. No acute osseous pathology. Osteopenia. Old healed fracture deformity of the right humeral neck. IMPRESSION: 1. Left lung base atelectasis or infiltrate. 2. Artifact versus possible pneumoperitoneum. Further evaluation with CT is recommended. 3. Atherosclerotic calcification of the aortic arch. These results will be called to the ordering clinician or representative by the Radiologist Assistant, and communication documented in the PACS or [employer]. Electronically Signed   By: Elgie Collard M.D.   On: 12/10/2020 20:03   CT Angio Chest PE W and/or Wo Contrast  Result Date: 12/10/2020 CLINICAL DATA:  Distension fever EXAM: CT ANGIOGRAPHY CHEST CT ABDOMEN AND PELVIS WITH CONTRAST TECHNIQUE: Multidetector CT imaging of the chest was performed using the standard protocol during bolus administration of intravenous contrast. Multiplanar CT image reconstructions and MIPs were obtained to evaluate the vascular anatomy. Multidetector CT imaging of the abdomen and pelvis was performed  using the standard protocol during bolus administration of intravenous contrast. CONTRAST:  OMNIPAQUE IOHEXOL 350 MG/ML SOLN COMPARISON:  Chest x-ray 12/10/2020, CT 10/08/2020 FINDINGS: CTA CHEST FINDINGS Cardiovascular: Satisfactory opacification of the pulmonary arteries to the segmental level. No evidence of pulmonary embolism. Mild aortic atherosclerosis. No aneurysm. Coronary vascular calcification. Normal cardiac size. No pericardial effusion  Mediastinum/Nodes: Midline trachea. 10 mm hypodense nodule left lobe of thyroid, no further workup recommended based on age of patient and size of lesion. No suspicious adenopathy. Esophagus within normal limits. Lungs/Pleura: No pleural effusion or pneumothorax. Mild lower lobe bronchial wall thickening with patchy atelectasis. Mild mosaic pattern potentially due to small airways disease. Musculoskeletal: Sternum is intact. No acute osseous abnormality. Chronic rib fractures. Review of the MIP images confirms the above findings. CT ABDOMEN and PELVIS FINDINGS Hepatobiliary: No focal liver abnormality is seen. Small gallstones. No biliary dilatation. Pancreas: Unremarkable. No pancreatic ductal dilatation or surrounding inflammatory changes. Spleen: Normal in size without focal abnormality. Adrenals/Urinary Tract: Adrenal glands are normal. Small nonobstructing bilateral kidney stones. Slightly enlarged bilateral renal pelvises with hydroureter bilaterally. Diffuse urothelial enhancement of the ureters and renal pelvis. No obstructing stones. Bladder grossly unremarkable. Cyst midpole left kidney Stomach/Bowel: Stomach nonenlarged. No dilated small bowel. No acute bowel wall thickening. Mild diffuse increased small and large bowel gas. Vascular/Lymphatic: Moderate aortic atherosclerosis. No aneurysm. No suspicious nodes Reproductive: Slightly enlarged prostate Other: Negative for free air.  Small amount of pelvic effusion Musculoskeletal: No acute osseous  abnormality. Mild superior endplate deformity at L2 chronic. Review of the MIP images confirms the above findings. IMPRESSION: 1. Negative for acute pulmonary embolus. Mild bilateral lower lobe central airways thickening without consolidative pneumonia. 2. Slightly enlarged renal pelvises with mild bilateral hydroureter. Diffuse urothelial enhancement of the renal pelvis and ureters bilaterally, possibly due to ascending urinary tract infection. There are bilateral kidney stones but no ureteral stones. 3. Trace pelvic effusion 4. Gallstones Electronically Signed   By: Jasmine Pang M.D.   On: 12/10/2020 21:19   CT ABDOMEN PELVIS W CONTRAST  Result Date: 12/10/2020 CLINICAL DATA:  Distension fever EXAM: CT ANGIOGRAPHY CHEST CT ABDOMEN AND PELVIS WITH CONTRAST TECHNIQUE: Multidetector CT imaging of the chest was performed using the standard protocol during bolus administration of intravenous contrast. Multiplanar CT image reconstructions and MIPs were obtained to evaluate the vascular anatomy. Multidetector CT imaging of the abdomen and pelvis was performed using the standard protocol during bolus administration of intravenous contrast. CONTRAST:  OMNIPAQUE IOHEXOL 350 MG/ML SOLN COMPARISON:  Chest x-ray 12/10/2020, CT 10/08/2020 FINDINGS: CTA CHEST FINDINGS Cardiovascular: Satisfactory opacification of the pulmonary arteries to the segmental level. No evidence of pulmonary embolism. Mild aortic atherosclerosis. No aneurysm. Coronary vascular calcification. Normal cardiac size. No pericardial effusion Mediastinum/Nodes: Midline trachea. 10 mm hypodense nodule left lobe of thyroid, no further workup recommended based on age of patient and size of lesion. No suspicious adenopathy. Esophagus within normal limits. Lungs/Pleura: No pleural effusion or pneumothorax. Mild lower lobe bronchial wall thickening with patchy atelectasis. Mild mosaic pattern potentially due to small airways disease. Musculoskeletal:  Sternum is intact. No acute osseous abnormality. Chronic rib fractures. Review of the MIP images confirms the above findings. CT ABDOMEN and PELVIS FINDINGS Hepatobiliary: No focal liver abnormality is seen. Small gallstones. No biliary dilatation. Pancreas: Unremarkable. No pancreatic ductal dilatation or surrounding inflammatory changes. Spleen: Normal in size without focal abnormality. Adrenals/Urinary Tract: Adrenal glands are normal. Small nonobstructing bilateral kidney stones. Slightly enlarged bilateral renal pelvises with hydroureter bilaterally. Diffuse urothelial enhancement of the ureters and renal pelvis. No obstructing stones. Bladder grossly unremarkable. Cyst midpole left kidney Stomach/Bowel: Stomach nonenlarged. No dilated small bowel. No acute bowel wall thickening. Mild diffuse increased small and large bowel gas. Vascular/Lymphatic: Moderate aortic atherosclerosis. No aneurysm. No suspicious nodes Reproductive: Slightly enlarged prostate Other: Negative for free air.  Small  amount of pelvic effusion Musculoskeletal: No acute osseous abnormality. Mild superior endplate deformity at L2 chronic. Review of the MIP images confirms the above findings. IMPRESSION: 1. Negative for acute pulmonary embolus. Mild bilateral lower lobe central airways thickening without consolidative pneumonia. 2. Slightly enlarged renal pelvises with mild bilateral hydroureter. Diffuse urothelial enhancement of the renal pelvis and ureters bilaterally, possibly due to ascending urinary tract infection. There are bilateral kidney stones but no ureteral stones. 3. Trace pelvic effusion 4. Gallstones Electronically Signed   By: Jasmine Pang M.D.   On: 12/10/2020 21:19     Assessment/Plan 70 year old male with history of DM, CAD, C-spine fracture , OSA, seizure disorder, urinary retention who self catheterizes, hospitalized a month ago for SBO treated conservatively, presenting with a 1 day history of fever and malaise.          Sepsis secondary to UTI [facility])   Intermittent self-catheterization of bladder   Hydroureter, bilateral on CT - Sepsis criteria include fever, tachycardia and leukocytosis with lactic acid 2.3 and abnormal UA, likely complicated UTI - Foley catheter was placed in the ED given hydroureter on CT in spite of intermittent catheterization - Continue cefepime - Follow cultures - IV fluids resuscitation per sepsis protocol - Continue urology consult in the a.m. - Continue tamsulosin    Essential hypertension - Continue metoprolol    CAD (coronary artery disease)   S/P coronary artery stent placement - Continue isosorbide, clopidogrel and aspirin and metoprolol and nitroglycerin and rosuvastatin    Seizure disorder (HCC) - Continue phenobarbital and phenytoin  Diabetes mellitus -Sliding scale insulin    DVT prophylaxis: Lovenox  Code Status: full code  Family Communication:  none  Disposition Plan: Back to previous home environment Consults called: none  Status: Observation    Andris Baumann MD [facility]     12/10/2020, 11:45 PM

## 2020-12-10 NOTE — Sepsis Progress Note (Signed)
Notified provider of need to order repeat lactic acid. ° °

## 2020-12-10 NOTE — ED Triage Notes (Signed)
BIBA from home for "generalized unwellness". Patient had oral temp of 104.9 per EMS upon arrival. Patient very warm to touch.  Hx unspecific kidney problems

## 2020-12-11 DIAGNOSIS — I959 Hypotension, unspecified: Secondary | ICD-10-CM

## 2020-12-11 DIAGNOSIS — N3001 Acute cystitis with hematuria: Secondary | ICD-10-CM

## 2020-12-11 DIAGNOSIS — N134 Hydroureter: Secondary | ICD-10-CM | POA: Diagnosis not present

## 2020-12-11 DIAGNOSIS — I2510 Atherosclerotic heart disease of native coronary artery without angina pectoris: Secondary | ICD-10-CM

## 2020-12-11 DIAGNOSIS — G40909 Epilepsy, unspecified, not intractable, without status epilepticus: Secondary | ICD-10-CM

## 2020-12-11 DIAGNOSIS — A419 Sepsis, unspecified organism: Secondary | ICD-10-CM | POA: Diagnosis not present

## 2020-12-11 LAB — CBC
HCT: 35.6 % — ABNORMAL LOW (ref 39.0–52.0)
Hemoglobin: 11.9 g/dL — ABNORMAL LOW (ref 13.0–17.0)
MCH: 31.2 pg (ref 26.0–34.0)
MCHC: 33.4 g/dL (ref 30.0–36.0)
MCV: 93.2 fL (ref 80.0–100.0)
Platelets: 135 10*3/uL — ABNORMAL LOW (ref 150–400)
RBC: 3.82 MIL/uL — ABNORMAL LOW (ref 4.22–5.81)
RDW: 14 % (ref 11.5–15.5)
WBC: 14.7 10*3/uL — ABNORMAL HIGH (ref 4.0–10.5)
nRBC: 0 % (ref 0.0–0.2)

## 2020-12-11 LAB — CREATININE, SERUM
Creatinine, Ser: 0.61 mg/dL (ref 0.61–1.24)
GFR, Estimated: >60 mL/min (ref >60)

## 2020-12-11 LAB — GLUCOSE, CAPILLARY
Glucose-Capillary: 139 mg/dL — ABNORMAL HIGH (ref 70–99)
Glucose-Capillary: 122 mg/dL — ABNORMAL HIGH (ref 70–99)
Glucose-Capillary: 120 mg/dL — ABNORMAL HIGH (ref 70–99)
Glucose-Capillary: 124 mg/dL — ABNORMAL HIGH (ref 70–99)

## 2020-12-11 LAB — HEMOGLOBIN A1C
Hgb A1c MFr Bld: 5.3 % (ref 4.8–5.6)
Mean Plasma Glucose: 105.41 mg/dL

## 2020-12-11 LAB — PROCALCITONIN: Procalcitonin: 1.92 ng/mL

## 2020-12-11 LAB — CORTISOL-AM, BLOOD: Cortisol - AM: 18.1 ug/dL (ref 6.7–22.6)

## 2020-12-11 MED ORDER — GLUCERNA SHAKE PO LIQD
237.0000 mL | Freq: Two times a day (BID) | ORAL | Status: DC
  Administered 2020-12-11 – 2020-12-13 (×4): 237 mL via ORAL

## 2020-12-11 MED ORDER — SODIUM CHLORIDE 0.9 % IV SOLN: INTRAVENOUS | Status: DC

## 2020-12-11 MED ORDER — SODIUM CHLORIDE 0.9 % IV SOLN
2.0000 g | Freq: Three times a day (TID) | INTRAVENOUS | Status: DC
  Administered 2020-12-11 – 2020-12-13 (×7): 2 g via INTRAVENOUS
  Filled 2020-12-11 (×9): qty 2

## 2020-12-11 MED ORDER — CHLORHEXIDINE GLUCONATE CLOTH 2 % EX PADS
6.0000 | MEDICATED_PAD | Freq: Every day | CUTANEOUS | Status: DC
  Administered 2020-12-11 – 2020-12-13 (×3): 6 via TOPICAL

## 2020-12-11 MED ORDER — METOPROLOL SUCCINATE ER 25 MG PO TB24
12.5000 mg | ORAL_TABLET | Freq: Every day | ORAL | Status: DC
  Administered 2020-12-12: 12.5 mg via ORAL
  Filled 2020-12-11 (×2): qty 1

## 2020-12-11 NOTE — Progress Notes (Signed)
Pharmacy Antibiotic Note  Edward Thompson is a 70 y.o. male admitted on 12/10/2020 with UTI.  Pharmacy has been consulted for Cefepime dosing.  Plan: Cefepime 2 gm IV x 1 given on 8/3 @ 2031.  Cefepime 2 gm IV Q8H ordered to continue on 8/4 @ 0500.   Height: 5\' 4"  (162.6 cm) Weight: 74.4 kg (164 lb 0.4 oz) IBW/kg (Calculated) : 59.2  Temp (24hrs), Avg:101.3 F (38.5 C), Min:98.7 F (37.1 C), Max:103.9 F (39.9 C)  Recent Labs  Lab 12/10/20 1936 12/10/20 2129 12/10/20 2330  WBC 12.4*  --   --   CREATININE 0.61  --   --   LATICACIDVEN 1.3 2.3* 1.1    Estimated Creatinine Clearance: 79.4 mL/min (by C-G formula based on SCr of 0.61 mg/dL).    No Known Allergies  Antimicrobials this admission:   >>    >>   Dose adjustments this admission:   Microbiology results:  BCx:   UCx:    Sputum:    MRSA PCR:   Thank you for allowing pharmacy to be a part of this patient's care.  Kaushal Vannice D 12/11/2020 1:24 AM

## 2020-12-11 NOTE — Progress Notes (Signed)
Patient ID: Edward Thompson, male   DOB: [birth year], [age] y.o.   MRN: [id number] [facility] PROGRESS NOTE  Edward CouncilmanLeonard R Kittler WUJ:[id number] DOB: [birth year] DOA: 12/10/2020 PCP: Sherron Mondayejan-Sie, S Ahmed, MD  HPI/Subjective: Patient feeling okay.  Came in with fever and tiredness.  In the ER was found to have bilateral hydronephrosis and Foley catheter was placed.  Normally does self catheterizations at home.  Patient admitted with sepsis with urinary source.  Objective: Vitals:   12/11/20 1203 12/11/20 1451  BP: (!) 99/50 (!) 101/53  Pulse: 80 81  Resp: 17 17  Temp: 99.2 F (37.3 C) 99.1 F (37.3 C)  SpO2: 97% 96%    Intake/Output Summary (Last 24 hours) at 12/11/2020 1648 Last data filed at 12/11/2020 1023 Gross per 24 hour  Intake 2980.69 ml  Output 1250 ml  Net 1730.69 ml   Filed Weights   12/10/20 1911  Weight: 74.4 kg    ROS: Review of Systems  Respiratory:  Negative for shortness of breath.   Cardiovascular:  Negative for chest pain.  Gastrointestinal:  Negative for abdominal pain, nausea and vomiting.  Exam: Physical Exam HENT:     Head: Normocephalic.     Mouth/Throat:     Pharynx: No oropharyngeal exudate.  Eyes:     General: Lids are normal.     Conjunctiva/sclera: Conjunctivae normal.  Cardiovascular:     Rate and Rhythm: Normal rate and regular rhythm.     Heart sounds: Normal heart sounds, S1 normal and S2 normal.  Pulmonary:     Breath sounds: No decreased breath sounds, wheezing, rhonchi or rales.  Abdominal:     Palpations: Abdomen is soft.     Tenderness: There is no abdominal tenderness.  Musculoskeletal:     Right lower leg: Swelling present.     Left lower leg: Swelling present.  Skin:    General: Skin is warm.     Findings: No rash.  Neurological:     Mental Status: He is alert and oriented to person, place, and time.     Data Reviewed: Basic Metabolic Panel: Recent Labs  Lab 12/10/20 1936 12/11/20 0108  NA 133*  --   K 3.6  --   CL 99   --   CO2 23  --   GLUCOSE 126*  --   BUN 17  --   CREATININE 0.61 0.61  CALCIUM 8.8*  --    Liver Function Tests: Recent Labs  Lab 12/10/20 1936  AST 23  ALT 19  ALKPHOS 88  BILITOT 0.6  PROT 7.1  ALBUMIN 3.8    CBC: Recent Labs  Lab 12/10/20 1936 12/11/20 0108  WBC 12.4* 14.7*  NEUTROABS 11.4*  --   HGB 12.1* 11.9*  HCT 35.0* 35.6*  MCV 90.7 93.2  PLT 155 135*     CBG: Recent Labs  Lab 12/11/20 0752 12/11/20 1203 12/11/20 1610  GLUCAP 139* 122* 120*    Recent Results (from the past 240 hour(s))  Culture, blood (Routine x 2)     Status: None (Preliminary result)   Collection Time: 12/10/20  7:36 PM   Specimen: BLOOD  Result Value Ref Range Status   Specimen Description BLOOD BLOOD RIGHT ARM  Final   Special Requests   Final    BOTTLES DRAWN AEROBIC AND ANAEROBIC Blood Culture results may not be optimal due to an excessive volume of blood received in culture bottles   Culture   Final    NO GROWTH < 12 HOURS  Performed at [facility], [address] [place], [place] [address]    Report Status PENDING  Incomplete  Culture, blood (Routine x 2)     Status: None (Preliminary result)   Collection Time: 12/10/20  7:36 PM   Specimen: BLOOD  Result Value Ref Range Status   Specimen Description BLOOD RIGHT ANTECUBITAL  Final   Special Requests   Final    BOTTLES DRAWN AEROBIC AND ANAEROBIC Blood Culture results may not be optimal due to an excessive volume of blood received in culture bottles   Culture   Final    NO GROWTH < 12 HOURS Performed at [facility], [address], [place], [place] [address]    Report Status PENDING  Incomplete  Resp Panel by RT-PCR (Flu A&B, Covid) Nasopharyngeal Swab     Status: None   Collection Time: 12/10/20  8:38 PM   Specimen: Nasopharyngeal Swab; Nasopharyngeal(NP) swabs in vial transport medium  Result Value Ref Range Status   SARS Coronavirus 2 by RT PCR NEGATIVE NEGATIVE Final    Comment:  (NOTE) SARS-CoV-2 target nucleic acids are NOT DETECTED.  The SARS-CoV-2 RNA is generally detectable in upper respiratory specimens during the acute phase of infection. The lowest concentration of SARS-CoV-2 viral copies this assay can detect is 138 copies/mL. A negative result does not preclude SARS-Cov-2 infection and should not be used as the sole basis for treatment or other patient management decisions. A negative result may occur with  improper specimen collection/handling, submission of specimen other than nasopharyngeal swab, presence of viral mutation(s) within the areas targeted by this assay, and inadequate number of viral copies(<138 copies/mL). A negative result must be combined with clinical observations, patient history, and epidemiological information. The expected result is Negative.  Fact Sheet for Patients:  BloggerCourse.com  Fact Sheet for Healthcare Providers:  SeriousBroker.it  This test is no t yet approved or cleared by the [place] FDA and  has been authorized for detection and/or diagnosis of SARS-CoV-2 by FDA under an Emergency Use Authorization (EUA). This EUA will remain  in effect (meaning this test can be used) for the duration of the COVID-19 declaration under Section 564(b)(1) of the Act, 21 U.S.C.section 360bbb-3(b)(1), unless the authorization is terminated  or revoked sooner.       Influenza A by PCR NEGATIVE NEGATIVE Final   Influenza B by PCR NEGATIVE NEGATIVE Final    Comment: (NOTE) The Xpert Xpress SARS-CoV-2/FLU/RSV plus assay is intended as an aid in the diagnosis of influenza from Nasopharyngeal swab specimens and should not be used as a sole basis for treatment. Nasal washings and aspirates are unacceptable for Xpert Xpress SARS-CoV-2/FLU/RSV testing.  Fact Sheet for Patients: BloggerCourse.com  Fact Sheet for Healthcare  Providers: SeriousBroker.it  This test is not yet approved or cleared by the [place] FDA and has been authorized for detection and/or diagnosis of SARS-CoV-2 by FDA under an Emergency Use Authorization (EUA). This EUA will remain in effect (meaning this test can be used) for the duration of the COVID-19 declaration under Section 564(b)(1) of the Act, 21 U.S.C. section 360bbb-3(b)(1), unless the authorization is terminated or revoked.  Performed at [facility], [address] [place], [place] [address]      Studies: DG Chest 1 View  Result Date: 12/10/2020 CLINICAL DATA:  70 year old male with generalized pain and elevated temperature. EXAM: CHEST  1 VIEW COMPARISON:  Chest radiograph dated 12/08/2018. FINDINGS: Left lung base density may represent atelectasis or infiltrate. Trace  left pleural effusion may be present. The cardiac silhouette is within limits. Atherosclerotic calcification of the aortic arch. There is lucency under the right hemidiaphragm which may be artifactual and related to interposition of the air-filled colon. Pneumoperitoneum is not excluded. Further evaluation with CT is recommended. A sub pulmonic pneumothorax is less likely. No acute osseous pathology. Osteopenia. Old healed fracture deformity of the right humeral neck. IMPRESSION: 1. Left lung base atelectasis or infiltrate. 2. Artifact versus possible pneumoperitoneum. Further evaluation with CT is recommended. 3. Atherosclerotic calcification of the aortic arch. These results will be called to the ordering clinician or representative by the Radiologist Assistant, and communication documented in the PACS or [employer]. Electronically Signed   By: Elgie Collard M.D.   On: 12/10/2020 20:03   CT Angio Chest PE W and/or Wo Contrast  Result Date: 12/10/2020 CLINICAL DATA:  Distension fever EXAM: CT ANGIOGRAPHY CHEST CT ABDOMEN AND PELVIS WITH CONTRAST TECHNIQUE:  Multidetector CT imaging of the chest was performed using the standard protocol during bolus administration of intravenous contrast. Multiplanar CT image reconstructions and MIPs were obtained to evaluate the vascular anatomy. Multidetector CT imaging of the abdomen and pelvis was performed using the standard protocol during bolus administration of intravenous contrast. CONTRAST:  OMNIPAQUE IOHEXOL 350 MG/ML SOLN COMPARISON:  Chest x-ray 12/10/2020, CT 10/08/2020 FINDINGS: CTA CHEST FINDINGS Cardiovascular: Satisfactory opacification of the pulmonary arteries to the segmental level. No evidence of pulmonary embolism. Mild aortic atherosclerosis. No aneurysm. Coronary vascular calcification. Normal cardiac size. No pericardial effusion Mediastinum/Nodes: Midline trachea. 10 mm hypodense nodule left lobe of thyroid, no further workup recommended based on age of patient and size of lesion. No suspicious adenopathy. Esophagus within normal limits. Lungs/Pleura: No pleural effusion or pneumothorax. Mild lower lobe bronchial wall thickening with patchy atelectasis. Mild mosaic pattern potentially due to small airways disease. Musculoskeletal: Sternum is intact. No acute osseous abnormality. Chronic rib fractures. Review of the MIP images confirms the above findings. CT ABDOMEN and PELVIS FINDINGS Hepatobiliary: No focal liver abnormality is seen. Small gallstones. No biliary dilatation. Pancreas: Unremarkable. No pancreatic ductal dilatation or surrounding inflammatory changes. Spleen: Normal in size without focal abnormality. Adrenals/Urinary Tract: Adrenal glands are normal. Small nonobstructing bilateral kidney stones. Slightly enlarged bilateral renal pelvises with hydroureter bilaterally. Diffuse urothelial enhancement of the ureters and renal pelvis. No obstructing stones. Bladder grossly unremarkable. Cyst midpole left kidney Stomach/Bowel: Stomach nonenlarged. No dilated small bowel. No acute bowel wall  thickening. Mild diffuse increased small and large bowel gas. Vascular/Lymphatic: Moderate aortic atherosclerosis. No aneurysm. No suspicious nodes Reproductive: Slightly enlarged prostate Other: Negative for free air.  Small amount of pelvic effusion Musculoskeletal: No acute osseous abnormality. Mild superior endplate deformity at L2 chronic. Review of the MIP images confirms the above findings. IMPRESSION: 1. Negative for acute pulmonary embolus. Mild bilateral lower lobe central airways thickening without consolidative pneumonia. 2. Slightly enlarged renal pelvises with mild bilateral hydroureter. Diffuse urothelial enhancement of the renal pelvis and ureters bilaterally, possibly due to ascending urinary tract infection. There are bilateral kidney stones but no ureteral stones. 3. Trace pelvic effusion 4. Gallstones Electronically Signed   By: Jasmine Pang M.D.   On: 12/10/2020 21:19   CT ABDOMEN PELVIS W CONTRAST  Result Date: 12/10/2020 CLINICAL DATA:  Distension fever EXAM: CT ANGIOGRAPHY CHEST CT ABDOMEN AND PELVIS WITH CONTRAST TECHNIQUE: Multidetector CT imaging of the chest was performed using the standard protocol during bolus administration of intravenous contrast. Multiplanar CT image reconstructions and MIPs were  obtained to evaluate the vascular anatomy. Multidetector CT imaging of the abdomen and pelvis was performed using the standard protocol during bolus administration of intravenous contrast. CONTRAST:  OMNIPAQUE IOHEXOL 350 MG/ML SOLN COMPARISON:  Chest x-ray 12/10/2020, CT 10/08/2020 FINDINGS: CTA CHEST FINDINGS Cardiovascular: Satisfactory opacification of the pulmonary arteries to the segmental level. No evidence of pulmonary embolism. Mild aortic atherosclerosis. No aneurysm. Coronary vascular calcification. Normal cardiac size. No pericardial effusion Mediastinum/Nodes: Midline trachea. 10 mm hypodense nodule left lobe of thyroid, no further workup recommended based on age of  patient and size of lesion. No suspicious adenopathy. Esophagus within normal limits. Lungs/Pleura: No pleural effusion or pneumothorax. Mild lower lobe bronchial wall thickening with patchy atelectasis. Mild mosaic pattern potentially due to small airways disease. Musculoskeletal: Sternum is intact. No acute osseous abnormality. Chronic rib fractures. Review of the MIP images confirms the above findings. CT ABDOMEN and PELVIS FINDINGS Hepatobiliary: No focal liver abnormality is seen. Small gallstones. No biliary dilatation. Pancreas: Unremarkable. No pancreatic ductal dilatation or surrounding inflammatory changes. Spleen: Normal in size without focal abnormality. Adrenals/Urinary Tract: Adrenal glands are normal. Small nonobstructing bilateral kidney stones. Slightly enlarged bilateral renal pelvises with hydroureter bilaterally. Diffuse urothelial enhancement of the ureters and renal pelvis. No obstructing stones. Bladder grossly unremarkable. Cyst midpole left kidney Stomach/Bowel: Stomach nonenlarged. No dilated small bowel. No acute bowel wall thickening. Mild diffuse increased small and large bowel gas. Vascular/Lymphatic: Moderate aortic atherosclerosis. No aneurysm. No suspicious nodes Reproductive: Slightly enlarged prostate Other: Negative for free air.  Small amount of pelvic effusion Musculoskeletal: No acute osseous abnormality. Mild superior endplate deformity at L2 chronic. Review of the MIP images confirms the above findings. IMPRESSION: 1. Negative for acute pulmonary embolus. Mild bilateral lower lobe central airways thickening without consolidative pneumonia. 2. Slightly enlarged renal pelvises with mild bilateral hydroureter. Diffuse urothelial enhancement of the renal pelvis and ureters bilaterally, possibly due to ascending urinary tract infection. There are bilateral kidney stones but no ureteral stones. 3. Trace pelvic effusion 4. Gallstones Electronically Signed   By: Jasmine Pang M.D.    On: 12/10/2020 21:19    Scheduled Meds:  aspirin EC  81 mg Oral QHS   Chlorhexidine Gluconate Cloth  6 each Topical Daily   clopidogrel  75 mg Oral QHS   enoxaparin (LOVENOX) injection  40 mg Subcutaneous Q24H   ezetimibe  10 mg Oral Daily   feeding supplement (GLUCERNA SHAKE)  237 mL Oral BID BM   insulin aspart  0-15 Units Subcutaneous TID WC   insulin aspart  0-5 Units Subcutaneous QHS   isosorbide mononitrate  30 mg Oral QHS   metoprolol succinate  50 mg Oral Daily   PHENobarbital  129.6 mg Oral QHS   phenytoin  300 mg Oral QHS   polyethylene glycol  17 g Oral Daily   rosuvastatin  40 mg Oral QHS   tamsulosin  0.4 mg Oral Daily   Continuous Infusions:  ceFEPime (MAXIPIME) IV 2 g (12/11/20 1338)    Assessment/Plan:  Clinical sepsis secondary to acute cystitis with hematuria.  Patient had leukocytosis, fever of 103.9 and tachycardia.  Patient does self catheterizations at home.  Continue aggressive antibiotics with Maxipime for now and fine-tune antibiotics once we get urine culture results.  Lactic acidosis with lactic acid of 2.3. Bilateral hydroureter.  Continue Foley catheter.  Will need outpatient urology evaluation.  Continue Flomax. Relative hypotension.  Cut back on metoprolol dose.  Gentle IV fluids. Seizure disorder on phenobarbital and phenytoin History  of CAD on Imdur, aspirin, Toprol, Plavix and Crestor. Impaired fasting glucose.  Last hemoglobin A1c 5.3.        Code Status:     Code Status Orders  (From admission, onward)           Start     Ordered   12/10/20 2352  Full code  Continuous        12/10/20 2352           Code Status History     Date Active Date Inactive Code Status Order ID Comments User Context   10/08/2020 1016 10/11/2020 1913 Full Code [id number]  Lorretta Harp, MD ED   08/19/2017 1154 08/25/2017 1647 Full Code [id number]  Gilda Crease, Latina Craver, MD Inpatient   02/05/2016 1758 02/07/2016 1939 Full Code [id number]  Shaune Pollack, MD Inpatient       Family Communication: Spoke with daughter on the phone Disposition Plan: Status is: Inpatient  Dispo: The patient is from: Home              Anticipated d/c is to: Home              Patient currently being treated for sepsis with IV antibiotics   Difficult to place patient. No  Antibiotics: Cefepime  Time spent: 28 minutes  Althea [employer]

## 2020-12-11 NOTE — Evaluation (Signed)
Physical Therapy Evaluation Patient Details Name: Edward Thompson MRN: [id number] DOB: 1951-05-03 Today's Date: 12/11/2020   History of Present Illness  Edward Thompson is a 70 y.o. male with medical history significant for DM, CAD, C-spine fracture with viral meningitis, OSA, seizure disorder, urinary retention who self catheterizes, hospitalized a month ago for SBO treated conservatively, who presents to the ED with a 1 day history of fever and malaise.  He denies cough, chest pain or shortness of breath and denies abdominal pain.  He has had no nausea or vomiting or diarrhea.   Clinical Impression  Patient received in bed, he is agreeable to PT assessment. Patient reports chronic foot drop bilaterally. He uses wheelchair at baseline, but is able to stand and ambulate short distances with RW. Patient was mod independent with bed mobility, transfers with supervision and ambulated 4 feet with supervision/min guard. Patient is close to baseline mobility. He will continue to benefit from skilled PT while here to continue mobility and strengthening.     Follow Up Recommendations No PT follow up    Equipment Recommendations  None recommended by PT    Recommendations for Other Services       Precautions / Restrictions Precautions Precautions: Fall Restrictions Weight Bearing Restrictions: No      Mobility  Bed Mobility Overal bed mobility: Modified Independent             General bed mobility comments: patient mod independent with bed mobility    Transfers Overall transfer level: Modified independent Equipment used: Rolling walker (2 wheeled)             General transfer comment: patient performed sit to stand with supervision and RW  Ambulation/Gait Ambulation/Gait assistance: Min guard Gait Distance (Feet): 4 Feet Assistive device: Rolling walker (2 wheeled) Gait Pattern/deviations: Step-to pattern;Decreased step length - right;Decreased step length - left Gait  velocity: decreased   General Gait Details: patient has foot drop bilaterally. He ambulates short distance from bed to recliner using RW. Supervision/min guard.  Stairs            Wheelchair Mobility    Modified Rankin (Stroke Patients Only)       Balance Overall balance assessment: Needs assistance Sitting-balance support: Feet supported Sitting balance-Leahy Scale: Good     Standing balance support: Bilateral upper extremity supported;During functional activity Standing balance-Leahy Scale: Good Standing balance comment: supervision for short ambulation distance. BUE assist on RW.                             Pertinent Vitals/Pain Pain Assessment: No/denies pain    Home Living Family/patient expects to be discharged to:: Private residence Living Arrangements: Children Available Help at Discharge: Family;Available 24 hours/day Type of Home: House       Home Layout: One level Home Equipment: Wheelchair - [employer] - 2 wheels Additional Comments: Patient reports he lives with child and her children. Uses walker to transfer to his wheelchair.    Prior Function Level of Independence: Independent with assistive device(s)               Hand Dominance        Extremity/Trunk Assessment   Upper Extremity Assessment Upper Extremity Assessment: Overall WFL for tasks assessed    Lower Extremity Assessment Lower Extremity Assessment: Overall WFL for tasks assessed    Cervical / Trunk Assessment Cervical / Trunk Assessment: Normal  Communication   Communication: No difficulties  Cognition Arousal/Alertness: Awake/alert Behavior During Therapy: WFL for tasks assessed/performed Overall Cognitive Status: Within Functional Limits for tasks assessed                                        General Comments      Exercises     Assessment/Plan    PT Assessment Patient needs continued PT services  PT Problem List Decreased  strength;Decreased mobility;Decreased activity tolerance       PT Treatment Interventions Gait training;Functional mobility training;Therapeutic activities;Patient/family education;Therapeutic exercise    PT Goals (Current goals can be found in the Care Plan section)  Acute Rehab PT Goals Patient Stated Goal: to return home with family PT Goal Formulation: With patient Time For Goal Achievement: 12/18/20 Potential to Achieve Goals: Good    Frequency Min 2X/week   Barriers to discharge        Co-evaluation               AM-PAC PT "6 Clicks" Mobility  Outcome Measure Help needed turning from your back to your side while in a flat bed without using bedrails?: None Help needed moving from lying on your back to sitting on the side of a flat bed without using bedrails?: None Help needed moving to and from a bed to a chair (including a wheelchair)?: A Little Help needed standing up from a chair using your arms (e.g., wheelchair or bedside chair)?: None Help needed to walk in hospital room?: A Little Help needed climbing 3-5 steps with a railing? : A Lot 6 Click Score: 20    End of Session   Activity Tolerance: Patient tolerated treatment well Patient left: in chair;with call bell/phone within reach;with chair alarm set Nurse Communication: Mobility status PT Visit Diagnosis: Other abnormalities of gait and mobility (R26.89);Difficulty in walking, not elsewhere classified (R26.2)    Time: 5329-9242 PT Time Calculation (min) (ACUTE ONLY): 19 min   Charges:   PT Evaluation $PT Eval Moderate Complexity: 1 Mod          Chrystian Ressler, PT, GCS 12/11/20,2:26 PM
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
## 2020-12-11 NOTE — Plan of Care (Signed)

## 2020-12-11 NOTE — Progress Notes (Signed)
Initial Nutrition Assessment  DOCUMENTATION CODES:  Not applicable  INTERVENTION:  Continue current diet as ordered, encourage PO intake.  Glucerna Shake po BID, each supplement provides 220 kcal and 10 grams of protein  NUTRITION DIAGNOSIS:  Increased nutrient needs related to acute illness (sepsis) as evidenced by estimated needs.  GOAL:  Patient will meet greater than or equal to 90% of their needs  MONITOR:  PO intake, Supplement acceptance  REASON FOR ASSESSMENT:  Malnutrition Screening Tool    ASSESSMENT:  70 y.o. male with medical hx of CAD, DM, HTN, Hx MI, and HLD, presented to ED with weakness and high fevers at home.  Found to be septic in ED thought to be related to UTI. Hx of urinary retention requiring self-catheterization at home.  Pt resting in bed at the time of visit. Pt reports that he had a good breakfast this AM. States that he usually eats 2 meals each day at home and may have like snacks between (fruit, yogurt, etc).   Reports an unintentional weight loss of about 20 lbs over the last few months, not seen in chart but pt has a hx of mostly states weights. Did note a 2.7% weight loss in the last year which is not significant. Pt's legs are edematous on exam, may be masking some weight loss.   Pt denies any difficulty chewing or swallowing. No GI distress today. Agreeable to nutrition supplements to assist in meeting needs while recovering from acute illness. Prefers chocolate.   Average Meal Intake: 8/3-8/4: 100% intake x 1 recorded meal  Nutritionally Relevant Medications: Scheduled Meds:  ezetimibe  10 mg Oral Daily   insulin aspart  0-15 Units Subcutaneous TID WC   insulin aspart  0-5 Units Subcutaneous QHS   polyethylene glycol  17 g Oral Daily   rosuvastatin  40 mg Oral QHS   Continuous Infusions:  ceFEPime (MAXIPIME) IV 2 g (12/11/20 0522)   lactated ringers 150 mL/hr at 12/11/20 0522   PRN Meds: ondansetron, senna-docusate  Labs  Reviewed  NUTRITION - FOCUSED PHYSICAL EXAM: Flowsheet Row Most Recent Value  Orbital Region No depletion  Upper Arm Region No depletion  Thoracic and Lumbar Region No depletion  Buccal Region No depletion  Temple Region Mild depletion  Clavicle Bone Region No depletion  Clavicle and Acromion Bone Region No depletion  Scapular Bone Region No depletion  Dorsal Hand No depletion  Patellar Region No depletion  Anterior Thigh Region No depletion  Posterior Calf Region No depletion  Edema (RD Assessment) Moderate  [BLE]  Hair Reviewed  Eyes Reviewed  Mouth Reviewed  Skin Reviewed  Nails Reviewed    Diet Order:   Diet Order             Diet heart healthy/carb modified Room service appropriate? Yes; Fluid consistency: Thin  Diet effective now                   EDUCATION NEEDS:  No education needs have been identified at this time  Skin:  Skin Assessment: Reviewed RN Assessment  Last BM:  8/2 - PTA  Height:  Ht Readings from Last 1 Encounters:  12/10/20 5\' 4"  (1.626 m)    Weight:  Wt Readings from Last 1 Encounters:  12/10/20 74.4 kg    Ideal Body Weight:  59.1 kg  BMI:  Body mass index is 28.15 kg/m.  Estimated Nutritional Needs:  Kcal:  1700-1900 kcal/d Protein:  85-95 g/d Fluid:  >1800 mL/d  02/09/21,  RD, LDN Clinical Dietitian Pager on [place]

## 2020-12-11 NOTE — Progress Notes (Signed)
Patient arrived to unit 1A room 156 in stable condition. Aox4. No complaints of pain or discomfort. Foley intact. Unit/room orientation completed. Plan of care reviewed with patient. Bed alarm on. Call bell within reach.

## 2020-12-12 DIAGNOSIS — I959 Hypotension, unspecified: Secondary | ICD-10-CM | POA: Diagnosis not present

## 2020-12-12 DIAGNOSIS — N3001 Acute cystitis with hematuria: Secondary | ICD-10-CM | POA: Diagnosis not present

## 2020-12-12 DIAGNOSIS — A419 Sepsis, unspecified organism: Secondary | ICD-10-CM | POA: Diagnosis not present

## 2020-12-12 DIAGNOSIS — N134 Hydroureter: Secondary | ICD-10-CM | POA: Diagnosis not present

## 2020-12-12 LAB — BLOOD CULTURE ID PANEL (REFLEXED) - BCID2

## 2020-12-12 LAB — GLUCOSE, CAPILLARY
Glucose-Capillary: 125 mg/dL — ABNORMAL HIGH (ref 70–99)
Glucose-Capillary: 207 mg/dL — ABNORMAL HIGH (ref 70–99)
Glucose-Capillary: 163 mg/dL — ABNORMAL HIGH (ref 70–99)
Glucose-Capillary: 121 mg/dL — ABNORMAL HIGH (ref 70–99)

## 2020-12-12 LAB — URINE CULTURE

## 2020-12-12 MED ORDER — SODIUM CHLORIDE 0.9 % IV SOLN
INTRAVENOUS | Status: DC | PRN
  Administered 2020-12-12 – 2020-12-13 (×2): 250 mL via INTRAVENOUS

## 2020-12-12 NOTE — Progress Notes (Signed)
Physical Therapy Treatment Patient Details Name: Edward Thompson MRN: [id number] DOB: [birth year] Today's Date: 12/12/2020    History of Present Illness Edward Thompson is a 70 y.o. male with medical history significant for DM, CAD, C-spine fracture with viral meningitis, OSA, seizure disorder, urinary retention who self catheterizes, hospitalized a month ago for SBO treated conservatively, who presents to the ED with a 1 day history of fever and malaise.  He denies cough, chest pain or shortness of breath and denies abdominal pain.  He has had no nausea or vomiting or diarrhea.    PT Comments    Patient received in bed, just woke up. Patient reports he is feeling good. No complaints. He is mod independent with bed mobility, transfers and ambulation from bed to recliner. No physical assist needed, assist for managing lines, set up only. He appears to be at baseline level of functioning. No further skilled PT needed at this time. 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      Follow Up Recommendations  No PT follow up     Equipment Recommendations  None recommended by PT    Recommendations for Other Services       Precautions / Restrictions Precautions Precautions: Fall Restrictions Weight Bearing Restrictions: No    Mobility  Bed Mobility Overal bed mobility: Modified Independent             General bed mobility comments: patient mod independent with bed mobility    Transfers Overall transfer level: Modified independent Equipment used: Rolling walker (2 wheeled)             General transfer comment: no assist provided  Ambulation/Gait Ambulation/Gait assistance: Modified independent (Device/Increase time) Gait Distance (Feet): 4 Feet Assistive device: Rolling walker (2 wheeled) Gait Pattern/deviations: Step-to pattern;Decreased step length - right;Decreased step length - left;Steppage Gait velocity: decreased   General Gait Details:  patient has foot drop bilaterally. He ambulates short distance from bed to recliner using RW. Supervision/min guard.   Stairs             Wheelchair Mobility    Modified Rankin (Stroke Patients Only)       Balance Overall balance assessment: Modified Independent Sitting-balance support: Feet supported Sitting balance-Leahy Scale: Good     Standing balance support: Bilateral upper extremity supported;During functional activity Standing balance-Leahy Scale: Good Standing balance comment: supervision for short ambulation distance. BUE assist on RW.                            Cognition Arousal/Alertness: Lethargic Behavior During Therapy: WFL for tasks assessed/performed Overall Cognitive Status: Within Functional Limits for tasks assessed                                 General Comments: patient had just woken up, somewhat lethargic      Exercises Other Exercises Other Exercises: seated B LE exercises: LAQ, marching x 10 reps each    General Comments        Pertinent Vitals/Pain Pain Assessment: No/denies pain    Home Living                      Prior Function            PT Goals (current goals can now be found in the care plan section) Acute Rehab PT  Goals Patient Stated Goal: to return home with family PT Goal Formulation: With patient Time For Goal Achievement: 12/18/20 Potential to Achieve Goals: Good Progress towards PT goals: Progressing toward goals    Frequency    Min 2X/week      PT Plan Frequency needs to be updated    Co-evaluation              AM-PAC PT "6 Clicks" Mobility   Outcome Measure  Help needed turning from your back to your side while in a flat bed without using bedrails?: None Help needed moving from lying on your back to sitting on the side of a flat bed without using bedrails?: None Help needed moving to and from a bed to a chair (including a wheelchair)?: None Help needed  standing up from a chair using your arms (e.g., wheelchair or bedside chair)?: None Help needed to walk in hospital room?: None Help needed climbing 3-5 steps with a railing? : A Little 6 Click Score: 23    End of Session   Activity Tolerance: Patient tolerated treatment well Patient left: in chair;with call bell/phone within reach;with chair alarm set Nurse Communication: Mobility status PT Visit Diagnosis: Other abnormalities of gait and mobility (R26.89);Difficulty in walking, not elsewhere classified (R26.2)     Time: 0539-7673 PT Time Calculation (min) (ACUTE ONLY): 18 min  Charges:  $Therapeutic Exercise: 8-22 mins                    [employer], PT, GCS 12/12/20,12:03 PM

## 2020-12-12 NOTE — Progress Notes (Addendum)
Patient ID: Edward Thompson, male   DOB: [birth year], 70 y.o.   MRN: [id number] [facility] PROGRESS NOTE  JALEN GORING FOY:[id number] DOB: 04-26-1951 DOA: 12/10/2020 PCP: Sherron Monday, MD  HPI/Subjective: Patient feeling good.  Offers no complaints.  Blood pressure better today.  Had low-grade fever yesterday but afebrile so far today.  Unfortunately urine culture came out as multiple species.  Came in with sepsis secondary to urine infection.  Objective: Vitals:   12/12/20 0727 12/12/20 1140  BP: 122/75 (!) 111/59  Pulse: 76 78  Resp: 16 16  Temp: 98.3 F (36.8 C) 98.4 F (36.9 C)  SpO2: 96% 96%    Intake/Output Summary (Last 24 hours) at 12/12/2020 1236 Last data filed at 12/12/2020 1100 Gross per 24 hour  Intake 876.85 ml  Output 2200 ml  Net -1323.15 ml   Filed Weights   12/10/20 1911  Weight: 74.4 kg    ROS: Review of Systems  Respiratory:  Negative for shortness of breath.   Cardiovascular:  Negative for chest pain.  Gastrointestinal:  Negative for abdominal pain, nausea and vomiting.  Exam: Physical Exam HENT:     Head: Normocephalic.     Mouth/Throat:     Pharynx: No oropharyngeal exudate.  Eyes:     General: Lids are normal.     Conjunctiva/sclera: Conjunctivae normal.  Cardiovascular:     Rate and Rhythm: Normal rate and regular rhythm.     Heart sounds: Normal heart sounds, S1 normal and S2 normal.  Pulmonary:     Breath sounds: No decreased breath sounds, wheezing, rhonchi or rales.  Abdominal:     Palpations: Abdomen is soft.     Tenderness: There is no abdominal tenderness.  Musculoskeletal:     Right lower leg: Swelling present.     Left lower leg: Swelling present.  Skin:    General: Skin is warm.     Findings: No rash.  Neurological:     Mental Status: He is alert.     Comments: Answers all questions appropriately     Data Reviewed: Basic Metabolic Panel: Recent Labs  Lab 12/10/20 1936 12/11/20 0108  NA 133*  --   K 3.6   --   CL 99  --   CO2 23  --   GLUCOSE 126*  --   BUN 17  --   CREATININE 0.61 0.61  CALCIUM 8.8*  --    Liver Function Tests: Recent Labs  Lab 12/10/20 1936  AST 23  ALT 19  ALKPHOS 88  BILITOT 0.6  PROT 7.1  ALBUMIN 3.8   CBC: Recent Labs  Lab 12/10/20 1936 12/11/20 0108  WBC 12.4* 14.7*  NEUTROABS 11.4*  --   HGB 12.1* 11.9*  HCT 35.0* 35.6*  MCV 90.7 93.2  PLT 155 135*    BNP (last 3 results) Recent Labs    10/08/20 1248  BNP 36.0   CBG: Recent Labs  Lab 12/11/20 1203 12/11/20 1610 12/11/20 2045 12/12/20 0731 12/12/20 1137  GLUCAP 122* 120* 124* 125* 207*    Recent Results (from the past 240 hour(s))  Culture, blood (Routine x 2)     Status: None (Preliminary result)   Collection Time: 12/10/20  7:36 PM   Specimen: BLOOD  Result Value Ref Range Status   Specimen Description BLOOD BLOOD RIGHT ARM  Final   Special Requests   Final    BOTTLES DRAWN AEROBIC AND ANAEROBIC Blood Culture results may not be optimal due to an excessive  volume of blood received in culture bottles   Culture   Final    NO GROWTH 2 DAYS Performed at [facility], [address] [place], [place] [address]    Report Status PENDING  Incomplete  Culture, blood (Routine x 2)     Status: None (Preliminary result)   Collection Time: 12/10/20  7:36 PM   Specimen: BLOOD  Result Value Ref Range Status   Specimen Description BLOOD RIGHT ANTECUBITAL  Final   Special Requests   Final    BOTTLES DRAWN AEROBIC AND ANAEROBIC Blood Culture results may not be optimal due to an excessive volume of blood received in culture bottles   Culture   Final    NO GROWTH 2 DAYS Performed at [facility], [address] [place], [place] [address]    Report Status PENDING  Incomplete  Urine Culture     Status: Abnormal   Collection Time: 12/10/20  7:36 PM   Specimen: Urine, Random  Result Value Ref Range Status   Specimen Description   Final    URINE, RANDOM Performed at  [facility], [address] [place], [place] [address]    Special Requests   Final    NONE Performed at [facility], [address] [place], [place] [address]    Culture MULTIPLE SPECIES PRESENT, SUGGEST RECOLLECTION (A)  Final   Report Status 12/12/2020 FINAL  Final  Resp Panel by RT-PCR (Flu A&B, Covid) Nasopharyngeal Swab     Status: None   Collection Time: 12/10/20  8:38 PM   Specimen: Nasopharyngeal Swab; Nasopharyngeal(NP) swabs in vial transport medium  Result Value Ref Range Status   SARS Coronavirus 2 by RT PCR NEGATIVE NEGATIVE Final    Comment: (NOTE) SARS-CoV-2 target nucleic acids are NOT DETECTED.  The SARS-CoV-2 RNA is generally detectable in upper respiratory specimens during the acute phase of infection. The lowest concentration of SARS-CoV-2 viral copies this assay can detect is 138 copies/mL. A negative result does not preclude SARS-Cov-2 infection and should not be used as the sole basis for treatment or other patient management decisions. A negative result may occur with  improper specimen collection/handling, submission of specimen other than nasopharyngeal swab, presence of viral mutation(s) within the areas targeted by this assay, and inadequate number of viral copies(<138 copies/mL). A negative result must be combined with clinical observations, patient history, and epidemiological information. The expected result is Negative.  Fact Sheet for Patients:  BloggerCourse.comhttps://www.fda.gov/media/152166/download  Fact Sheet for Healthcare Providers:  SeriousBroker.ithttps://www.fda.gov/media/152162/download  This test is no t yet approved or cleared by the [place] FDA and  has been authorized for detection and/or diagnosis of SARS-CoV-2 by FDA under an Emergency Use Authorization (EUA). This EUA will remain  in effect (meaning this test can be used) for the duration of the COVID-19 declaration under Section 564(b)(1) of the Act, 21 U.S.C.section  360bbb-3(b)(1), unless the authorization is terminated  or revoked sooner.       Influenza A by PCR NEGATIVE NEGATIVE Final   Influenza B by PCR NEGATIVE NEGATIVE Final    Comment: (NOTE) The Xpert Xpress SARS-CoV-2/FLU/RSV plus assay is intended as an aid in the diagnosis of influenza from Nasopharyngeal swab specimens and should not be used as a sole basis for treatment. Nasal washings and aspirates are unacceptable for Xpert Xpress SARS-CoV-2/FLU/RSV testing.  Fact Sheet for Patients: BloggerCourse.comhttps://www.fda.gov/media/152166/download  Fact Sheet for Healthcare Providers: SeriousBroker.ithttps://www.fda.gov/media/152162/download  This test is not yet approved or cleared by the [place]  FDA and has been authorized for detection and/or diagnosis of SARS-CoV-2 by FDA under an Emergency Use Authorization (EUA). This EUA will remain in effect (meaning this test can be used) for the duration of the COVID-19 declaration under Section 564(b)(1) of the Act, 21 U.S.C. section 360bbb-3(b)(1), unless the authorization is terminated or revoked.  Performed at [facility], [address], [place], [place] [address]      Studies: DG Chest 1 View  Result Date: 12/10/2020 CLINICAL DATA:  70 year old male with generalized pain and elevated temperature. EXAM: CHEST  1 VIEW COMPARISON:  Chest radiograph dated 12/08/2018. FINDINGS: Left lung base density may represent atelectasis or infiltrate. Trace left pleural effusion may be present. The cardiac silhouette is within limits. Atherosclerotic calcification of the aortic arch. There is lucency under the right hemidiaphragm which may be artifactual and related to interposition of the air-filled colon. Pneumoperitoneum is not excluded. Further evaluation with CT is recommended. A sub pulmonic pneumothorax is less likely. No acute osseous pathology. Osteopenia. Old healed fracture deformity of the right humeral neck. IMPRESSION: 1. Left lung base atelectasis or  infiltrate. 2. Artifact versus possible pneumoperitoneum. Further evaluation with CT is recommended. 3. Atherosclerotic calcification of the aortic arch. These results will be called to the ordering clinician or representative by the Radiologist Assistant, and communication documented in the PACS or [employer]. Electronically Signed   By: Elgie Collard M.D.   On: 12/10/2020 20:03   CT Angio Chest PE W and/or Wo Contrast  Result Date: 12/10/2020 CLINICAL DATA:  Distension fever EXAM: CT ANGIOGRAPHY CHEST CT ABDOMEN AND PELVIS WITH CONTRAST TECHNIQUE: Multidetector CT imaging of the chest was performed using the standard protocol during bolus administration of intravenous contrast. Multiplanar CT image reconstructions and MIPs were obtained to evaluate the vascular anatomy. Multidetector CT imaging of the abdomen and pelvis was performed using the standard protocol during bolus administration of intravenous contrast. CONTRAST:  OMNIPAQUE IOHEXOL 350 MG/ML SOLN COMPARISON:  Chest x-ray 12/10/2020, CT 10/08/2020 FINDINGS: CTA CHEST FINDINGS Cardiovascular: Satisfactory opacification of the pulmonary arteries to the segmental level. No evidence of pulmonary embolism. Mild aortic atherosclerosis. No aneurysm. Coronary vascular calcification. Normal cardiac size. No pericardial effusion Mediastinum/Nodes: Midline trachea. 10 mm hypodense nodule left lobe of thyroid, no further workup recommended based on age of patient and size of lesion. No suspicious adenopathy. Esophagus within normal limits. Lungs/Pleura: No pleural effusion or pneumothorax. Mild lower lobe bronchial wall thickening with patchy atelectasis. Mild mosaic pattern potentially due to small airways disease. Musculoskeletal: Sternum is intact. No acute osseous abnormality. Chronic rib fractures. Review of the MIP images confirms the above findings. CT ABDOMEN and PELVIS FINDINGS Hepatobiliary: No focal liver abnormality is seen. Small  gallstones. No biliary dilatation. Pancreas: Unremarkable. No pancreatic ductal dilatation or surrounding inflammatory changes. Spleen: Normal in size without focal abnormality. Adrenals/Urinary Tract: Adrenal glands are normal. Small nonobstructing bilateral kidney stones. Slightly enlarged bilateral renal pelvises with hydroureter bilaterally. Diffuse urothelial enhancement of the ureters and renal pelvis. No obstructing stones. Bladder grossly unremarkable. Cyst midpole left kidney Stomach/Bowel: Stomach nonenlarged. No dilated small bowel. No acute bowel wall thickening. Mild diffuse increased small and large bowel gas. Vascular/Lymphatic: Moderate aortic atherosclerosis. No aneurysm. No suspicious nodes Reproductive: Slightly enlarged prostate Other: Negative for free air.  Small amount of pelvic effusion Musculoskeletal: No acute osseous abnormality. Mild superior endplate deformity at L2 chronic. Review of the MIP images confirms the above findings. IMPRESSION: 1. Negative for acute pulmonary embolus. Mild bilateral  lower lobe central airways thickening without consolidative pneumonia. 2. Slightly enlarged renal pelvises with mild bilateral hydroureter. Diffuse urothelial enhancement of the renal pelvis and ureters bilaterally, possibly due to ascending urinary tract infection. There are bilateral kidney stones but no ureteral stones. 3. Trace pelvic effusion 4. Gallstones Electronically Signed   By: Jasmine Pang M.D.   On: 12/10/2020 21:19   CT ABDOMEN PELVIS W CONTRAST  Result Date: 12/10/2020 CLINICAL DATA:  Distension fever EXAM: CT ANGIOGRAPHY CHEST CT ABDOMEN AND PELVIS WITH CONTRAST TECHNIQUE: Multidetector CT imaging of the chest was performed using the standard protocol during bolus administration of intravenous contrast. Multiplanar CT image reconstructions and MIPs were obtained to evaluate the vascular anatomy. Multidetector CT imaging of the abdomen and pelvis was performed using the standard  protocol during bolus administration of intravenous contrast. CONTRAST:  OMNIPAQUE IOHEXOL 350 MG/ML SOLN COMPARISON:  Chest x-ray 12/10/2020, CT 10/08/2020 FINDINGS: CTA CHEST FINDINGS Cardiovascular: Satisfactory opacification of the pulmonary arteries to the segmental level. No evidence of pulmonary embolism. Mild aortic atherosclerosis. No aneurysm. Coronary vascular calcification. Normal cardiac size. No pericardial effusion Mediastinum/Nodes: Midline trachea. 10 mm hypodense nodule left lobe of thyroid, no further workup recommended based on age of patient and size of lesion. No suspicious adenopathy. Esophagus within normal limits. Lungs/Pleura: No pleural effusion or pneumothorax. Mild lower lobe bronchial wall thickening with patchy atelectasis. Mild mosaic pattern potentially due to small airways disease. Musculoskeletal: Sternum is intact. No acute osseous abnormality. Chronic rib fractures. Review of the MIP images confirms the above findings. CT ABDOMEN and PELVIS FINDINGS Hepatobiliary: No focal liver abnormality is seen. Small gallstones. No biliary dilatation. Pancreas: Unremarkable. No pancreatic ductal dilatation or surrounding inflammatory changes. Spleen: Normal in size without focal abnormality. Adrenals/Urinary Tract: Adrenal glands are normal. Small nonobstructing bilateral kidney stones. Slightly enlarged bilateral renal pelvises with hydroureter bilaterally. Diffuse urothelial enhancement of the ureters and renal pelvis. No obstructing stones. Bladder grossly unremarkable. Cyst midpole left kidney Stomach/Bowel: Stomach nonenlarged. No dilated small bowel. No acute bowel wall thickening. Mild diffuse increased small and large bowel gas. Vascular/Lymphatic: Moderate aortic atherosclerosis. No aneurysm. No suspicious nodes Reproductive: Slightly enlarged prostate Other: Negative for free air.  Small amount of pelvic effusion Musculoskeletal: No acute osseous abnormality. Mild superior  endplate deformity at L2 chronic. Review of the MIP images confirms the above findings. IMPRESSION: 1. Negative for acute pulmonary embolus. Mild bilateral lower lobe central airways thickening without consolidative pneumonia. 2. Slightly enlarged renal pelvises with mild bilateral hydroureter. Diffuse urothelial enhancement of the renal pelvis and ureters bilaterally, possibly due to ascending urinary tract infection. There are bilateral kidney stones but no ureteral stones. 3. Trace pelvic effusion 4. Gallstones Electronically Signed   By: Jasmine Pang M.D.   On: 12/10/2020 21:19    Scheduled Meds:  aspirin EC  81 mg Oral QHS   Chlorhexidine Gluconate Cloth  6 each Topical Daily   clopidogrel  75 mg Oral QHS   enoxaparin (LOVENOX) injection  40 mg Subcutaneous Q24H   ezetimibe  10 mg Oral Daily   feeding supplement (GLUCERNA SHAKE)  237 mL Oral BID BM   insulin aspart  0-15 Units Subcutaneous TID WC   insulin aspart  0-5 Units Subcutaneous QHS   isosorbide mononitrate  30 mg Oral QHS   metoprolol succinate  12.5 mg Oral Daily   PHENobarbital  129.6 mg Oral QHS   phenytoin  300 mg Oral QHS   polyethylene glycol  17 g Oral Daily  rosuvastatin  40 mg Oral QHS   tamsulosin  0.4 mg Oral Daily   Continuous Infusions:  ceFEPime (MAXIPIME) IV 2 g (12/12/20 0504)   Brief history: Patient was admitted 12/10/2020 just before midnight.  Past medical history of CAD, C-spine fracture and viral meningitis, obstructive sleep apnea, seizure and history of self catheterizations.  Recent hospitalization for small bowel obstruction.  He presented with fever and malaise.  Found to have a temperature of 103, tachycardic.  Found to have acute cystitis with hematuria and clinical sepsis on presentation.  Started on Maxipime.  Patient had Foley catheter placed for bilateral hydroureter.  Patient also had relative hypotension and needed IV fluids initially.  Unfortunately urine culture grew out multiple species.   Temperature curve has come down.  Assessment/Plan:  Clinical sepsis, present on admission with acute cystitis and hematuria.  Patient had leukocytosis, fever up to 103.9 and tachycardia.  Patient does self catheterizations at home.  Patient on Maxipime here.  Unfortunately urine culture grew out multiple species.  Temperature curve has come down.  Anticipate discharge tomorrow. Bilateral hydroureter.  Patient had Foley catheter placed in the emergency room.  Will need outpatient urology follow-up.  Continue Flomax. Relative hypotension improved with IV fluids.  Discontinue fluids today.  On lower dose metoprolol. Seizure disorder.  Continue phenobarbital and phenytoin History of CAD on Imdur, aspirin, Toprol Plavix and Crestor. Impaired fasting glucose.        Code Status:     Code Status Orders  (From admission, onward)           Start     Ordered   12/10/20 2352  Full code  Continuous        12/10/20 2352           Code Status History     Date Active Date Inactive Code Status Order ID Comments User Context   10/08/2020 1016 10/11/2020 1913 Full Code [id number]  Lorretta Harp, MD ED   08/19/2017 1154 08/25/2017 1647 Full Code [id number]  Gilda Crease, Latina Craver, MD Inpatient   02/05/2016 1758 02/07/2016 1939 Full Code [id number]  Shaune Pollack, MD Inpatient      Family Communication: Spoke with daughter on the phone Disposition Plan: Status is: Inpatient  Dispo: The patient is from: Home              Anticipated d/c is to: Home likely tomorrow              Patient currently doing better with regards to his hypotension.  We will get rid of IV fluids today.  Unfortunately urine culture grew out multiple organisms.  Since he has a Foley catheter cannot resend urine culture at this point.   Difficult to place patient.  No.  Antibiotics: Maxipime  Time spent: 26 minutes  [employer]

## 2020-12-12 NOTE — Care Management Important Message (Signed)
Important Message  Patient Details  Name: Edward Thompson MRN: [id number] Date of Birth: 10-May-1951   Medicare Important Message Given:  Yes     [name] A Ceola Para 12/12/2020, 3:39 PM

## 2020-12-13 DIAGNOSIS — A419 Sepsis, unspecified organism: Secondary | ICD-10-CM | POA: Diagnosis not present

## 2020-12-13 DIAGNOSIS — N390 Urinary tract infection, site not specified: Secondary | ICD-10-CM | POA: Diagnosis not present

## 2020-12-13 LAB — BASIC METABOLIC PANEL
Anion gap: 8 (ref 5–15)
BUN: 18 mg/dL (ref 8–23)
CO2: 24 mmol/L (ref 22–32)
Calcium: 8.3 mg/dL — ABNORMAL LOW (ref 8.9–10.3)
Chloride: 104 mmol/L (ref 98–111)
Creatinine, Ser: 0.6 mg/dL — ABNORMAL LOW (ref 0.61–1.24)
GFR, Estimated: >60 mL/min (ref >60)
Glucose, Bld: 130 mg/dL — ABNORMAL HIGH (ref 70–99)
Potassium: 3.9 mmol/L (ref 3.5–5.1)
Sodium: 136 mmol/L (ref 135–145)

## 2020-12-13 LAB — CBC
HCT: 32.3 % — ABNORMAL LOW (ref 39.0–52.0)
Hemoglobin: 10.9 g/dL — ABNORMAL LOW (ref 13.0–17.0)
MCH: 31.2 pg (ref 26.0–34.0)
MCHC: 33.7 g/dL (ref 30.0–36.0)
MCV: 92.6 fL (ref 80.0–100.0)
Platelets: 153 10*3/uL (ref 150–400)
RBC: 3.49 MIL/uL — ABNORMAL LOW (ref 4.22–5.81)
RDW: 14 % (ref 11.5–15.5)
WBC: 7.9 10*3/uL (ref 4.0–10.5)
nRBC: 0 % (ref 0.0–0.2)

## 2020-12-13 LAB — GLUCOSE, CAPILLARY
Glucose-Capillary: 123 mg/dL — ABNORMAL HIGH (ref 70–99)
Glucose-Capillary: 212 mg/dL — ABNORMAL HIGH (ref 70–99)

## 2020-12-13 MED ORDER — METOPROLOL SUCCINATE ER 25 MG PO TB24: 12.5000 mg | ORAL_TABLET | Freq: Every day | ORAL | 0 refills | Status: DC

## 2020-12-13 MED ORDER — GLUCERNA SHAKE PO LIQD: 237.0000 mL | Freq: Two times a day (BID) | ORAL | 12 refills | Status: DC

## 2020-12-13 MED ORDER — CIPROFLOXACIN HCL 500 MG PO TABS: 500.0000 mg | ORAL_TABLET | Freq: Two times a day (BID) | ORAL | 0 refills | Status: AC

## 2020-12-13 NOTE — Discharge Summary (Signed)
Physician Discharge Summary  VERON SENNER IWP:[id number] DOB: [birth year] DOA: 12/10/2020  PCP: Sherron Monday, MD  Admit date: 12/10/2020 Discharge date: 12/13/2020  Admitted From: Home Disposition: Home  Recommendations for Outpatient Follow-up:  Follow up with PCP in 1-2 weeks Follow-up with urology in 1 to 2 weeks Please obtain BMP/CBC in one week Please follow up on the following pending results: None  Home Health: No Equipment/Devices: Foley catheter Discharge Condition: Stable CODE STATUS: Full Diet recommendation: Regular   Brief/Interim Summary:  Patient was admitted 12/10/2020 just before midnight.  Past medical history of CAD, C-spine fracture and viral meningitis, obstructive sleep apnea, seizure and history of self catheterizations.  Recent hospitalization for small bowel obstruction.  He presented with fever and malaise.  Found to have a temperature of 103, tachycardic.  Found to have acute cystitis with hematuria and clinical sepsis on presentation.  Started on Maxipime.  Patient had Foley catheter placed for bilateral hydroureter.  Patient also had relative hypotension and needed IV fluids initially.  Unfortunately urine culture grew out multiple species.  Fever resolved and he remained afebrile.  Patient discharged on ciprofloxacin to complete a 5-day course.  He will follow-up with urology as an outpatient as he was being discharged with Foley catheter.  His home dose of metoprolol was decreased due to hypotension, he will continue low-dose of Toprol and follow-up with his primary care provider and they can uptitrate the dose if needed.  He will continue rest of his home medications and follow-up with his providers.  Discharge Diagnoses:  Principal Problem:   Sepsis secondary to UTI [facility]) Active Problems:   Essential hypertension   CAD (coronary artery disease)   S/P coronary artery stent placement   Seizure disorder (HCC)   Diabetes mellitus without complication  (HCC)   Intermittent self-catheterization of bladder   Hydroureter   Acute cystitis with hematuria   Hypotension    Discharge Instructions  Discharge Instructions     Diet - low sodium heart healthy   Complete by: As directed    Discharge instructions   Complete by: As directed    It was pleasure taking care of you. Noted being given antibiotics for 3 more days, please take it as directed. You are also being discharged with Foley catheter-please follow-up with urology within 1 to 2 weeks for further recommendations. We decreased the dose of metoprolol because of your borderline low blood pressure, once blood pressure improved your primary care provider should be able to titrate up to your original dose. Keep yourself well-hydrated and follow-up with your primary care provider for further recommendations.   Increase activity slowly   Complete by: As directed       Allergies as of 12/13/2020   No Known Allergies      Medication List     TAKE these medications    aspirin EC 81 MG tablet Take 81 mg by mouth at bedtime.   ciprofloxacin 500 MG tablet Commonly known as: Cipro Take 1 tablet (500 mg total) by mouth 2 (two) times daily for 3 days.   clopidogrel 75 MG tablet Commonly known as: PLAVIX Take 75 mg by mouth at bedtime.   ezetimibe 10 MG tablet Commonly known as: ZETIA Take 10 mg by mouth daily.   feeding supplement (GLUCERNA SHAKE) Liqd Take 237 mLs by mouth 2 (two) times daily between meals.   isosorbide mononitrate 30 MG 24 hr tablet Commonly known as: IMDUR Take 30 mg by mouth at bedtime.  meclizine 25 MG tablet Commonly known as: ANTIVERT Take 50 mg by mouth 2 (two) times daily as needed for dizziness.   metFORMIN 500 MG tablet Commonly known as: GLUCOPHAGE Take 500 mg by mouth daily.   metoprolol succinate 25 MG 24 hr tablet Commonly known as: TOPROL-XL Take 0.5 tablets (12.5 mg total) by mouth daily. What changed:  medication strength how  much to take additional instructions   multivitamin tablet Take 1 tablet by mouth daily.   nitroGLYCERIN 0.4 MG SL tablet Commonly known as: NITROSTAT Place 0.4 mg under the tongue every 5 (five) minutes as needed for chest pain.   PHENobarbital 64.8 MG tablet Commonly known as: LUMINAL Take 2 tablets (129.6 mg total) by mouth at bedtime.   phenytoin 100 MG ER capsule Commonly known as: DILANTIN Take 300 mg by mouth at bedtime.   polyethylene glycol 17 g packet Commonly known as: MIRALAX / GLYCOLAX Take 17 g by mouth daily.   rosuvastatin 40 MG tablet Commonly known as: CRESTOR Take 40 mg by mouth at bedtime.   tamsulosin 0.4 MG Caps capsule Commonly known as: FLOMAX Take 1 capsule (0.4 mg total) by mouth daily.        Follow-up Information     Sherron Monday, MD. Schedule an appointment as soon as possible for a visit in 1 week(s).   Specialty: Internal Medicine Contact information: [address] [place] [address] [phone]                No Known Allergies  Consultations: None  Procedures/Studies: DG Chest 1 View  Result Date: 12/10/2020 CLINICAL DATA:  70 year old male with generalized pain and elevated temperature. EXAM: CHEST  1 VIEW COMPARISON:  Chest radiograph dated 12/08/2018. FINDINGS: Left lung base density may represent atelectasis or infiltrate. Trace left pleural effusion may be present. The cardiac silhouette is within limits. Atherosclerotic calcification of the aortic arch. There is lucency under the right hemidiaphragm which may be artifactual and related to interposition of the air-filled colon. Pneumoperitoneum is not excluded. Further evaluation with CT is recommended. A sub pulmonic pneumothorax is less likely. No acute osseous pathology. Osteopenia. Old healed fracture deformity of the right humeral neck. IMPRESSION: 1. Left lung base atelectasis or infiltrate. 2. Artifact versus possible pneumoperitoneum. Further evaluation  with CT is recommended. 3. Atherosclerotic calcification of the aortic arch. These results will be called to the ordering clinician or representative by the Radiologist Assistant, and communication documented in the PACS or [employer]. Electronically Signed   By: Elgie Collard M.D.   On: 12/10/2020 20:03   CT Angio Chest PE W and/or Wo Contrast  Result Date: 12/10/2020 CLINICAL DATA:  Distension fever EXAM: CT ANGIOGRAPHY CHEST CT ABDOMEN AND PELVIS WITH CONTRAST TECHNIQUE: Multidetector CT imaging of the chest was performed using the standard protocol during bolus administration of intravenous contrast. Multiplanar CT image reconstructions and MIPs were obtained to evaluate the vascular anatomy. Multidetector CT imaging of the abdomen and pelvis was performed using the standard protocol during bolus administration of intravenous contrast. CONTRAST:  OMNIPAQUE IOHEXOL 350 MG/ML SOLN COMPARISON:  Chest x-ray 12/10/2020, CT 10/08/2020 FINDINGS: CTA CHEST FINDINGS Cardiovascular: Satisfactory opacification of the pulmonary arteries to the segmental level. No evidence of pulmonary embolism. Mild aortic atherosclerosis. No aneurysm. Coronary vascular calcification. Normal cardiac size. No pericardial effusion Mediastinum/Nodes: Midline trachea. 10 mm hypodense nodule left lobe of thyroid, no further workup recommended based on age of patient and size of lesion. No suspicious adenopathy. Esophagus  within normal limits. Lungs/Pleura: No pleural effusion or pneumothorax. Mild lower lobe bronchial wall thickening with patchy atelectasis. Mild mosaic pattern potentially due to small airways disease. Musculoskeletal: Sternum is intact. No acute osseous abnormality. Chronic rib fractures. Review of the MIP images confirms the above findings. CT ABDOMEN and PELVIS FINDINGS Hepatobiliary: No focal liver abnormality is seen. Small gallstones. No biliary dilatation. Pancreas: Unremarkable. No pancreatic ductal  dilatation or surrounding inflammatory changes. Spleen: Normal in size without focal abnormality. Adrenals/Urinary Tract: Adrenal glands are normal. Small nonobstructing bilateral kidney stones. Slightly enlarged bilateral renal pelvises with hydroureter bilaterally. Diffuse urothelial enhancement of the ureters and renal pelvis. No obstructing stones. Bladder grossly unremarkable. Cyst midpole left kidney Stomach/Bowel: Stomach nonenlarged. No dilated small bowel. No acute bowel wall thickening. Mild diffuse increased small and large bowel gas. Vascular/Lymphatic: Moderate aortic atherosclerosis. No aneurysm. No suspicious nodes Reproductive: Slightly enlarged prostate Other: Negative for free air.  Small amount of pelvic effusion Musculoskeletal: No acute osseous abnormality. Mild superior endplate deformity at L2 chronic. Review of the MIP images confirms the above findings. IMPRESSION: 1. Negative for acute pulmonary embolus. Mild bilateral lower lobe central airways thickening without consolidative pneumonia. 2. Slightly enlarged renal pelvises with mild bilateral hydroureter. Diffuse urothelial enhancement of the renal pelvis and ureters bilaterally, possibly due to ascending urinary tract infection. There are bilateral kidney stones but no ureteral stones. 3. Trace pelvic effusion 4. Gallstones Electronically Signed   By: Jasmine Pang M.D.   On: 12/10/2020 21:19   CT ABDOMEN PELVIS W CONTRAST  Result Date: 12/10/2020 CLINICAL DATA:  Distension fever EXAM: CT ANGIOGRAPHY CHEST CT ABDOMEN AND PELVIS WITH CONTRAST TECHNIQUE: Multidetector CT imaging of the chest was performed using the standard protocol during bolus administration of intravenous contrast. Multiplanar CT image reconstructions and MIPs were obtained to evaluate the vascular anatomy. Multidetector CT imaging of the abdomen and pelvis was performed using the standard protocol during bolus administration of intravenous contrast. CONTRAST:   OMNIPAQUE IOHEXOL 350 MG/ML SOLN COMPARISON:  Chest x-ray 12/10/2020, CT 10/08/2020 FINDINGS: CTA CHEST FINDINGS Cardiovascular: Satisfactory opacification of the pulmonary arteries to the segmental level. No evidence of pulmonary embolism. Mild aortic atherosclerosis. No aneurysm. Coronary vascular calcification. Normal cardiac size. No pericardial effusion Mediastinum/Nodes: Midline trachea. 10 mm hypodense nodule left lobe of thyroid, no further workup recommended based on age of patient and size of lesion. No suspicious adenopathy. Esophagus within normal limits. Lungs/Pleura: No pleural effusion or pneumothorax. Mild lower lobe bronchial wall thickening with patchy atelectasis. Mild mosaic pattern potentially due to small airways disease. Musculoskeletal: Sternum is intact. No acute osseous abnormality. Chronic rib fractures. Review of the MIP images confirms the above findings. CT ABDOMEN and PELVIS FINDINGS Hepatobiliary: No focal liver abnormality is seen. Small gallstones. No biliary dilatation. Pancreas: Unremarkable. No pancreatic ductal dilatation or surrounding inflammatory changes. Spleen: Normal in size without focal abnormality. Adrenals/Urinary Tract: Adrenal glands are normal. Small nonobstructing bilateral kidney stones. Slightly enlarged bilateral renal pelvises with hydroureter bilaterally. Diffuse urothelial enhancement of the ureters and renal pelvis. No obstructing stones. Bladder grossly unremarkable. Cyst midpole left kidney Stomach/Bowel: Stomach nonenlarged. No dilated small bowel. No acute bowel wall thickening. Mild diffuse increased small and large bowel gas. Vascular/Lymphatic: Moderate aortic atherosclerosis. No aneurysm. No suspicious nodes Reproductive: Slightly enlarged prostate Other: Negative for free air.  Small amount of pelvic effusion Musculoskeletal: No acute osseous abnormality. Mild superior endplate deformity at L2 chronic. Review of the MIP images confirms the above  findings. IMPRESSION: 1.  Negative for acute pulmonary embolus. Mild bilateral lower lobe central airways thickening without consolidative pneumonia. 2. Slightly enlarged renal pelvises with mild bilateral hydroureter. Diffuse urothelial enhancement of the renal pelvis and ureters bilaterally, possibly due to ascending urinary tract infection. There are bilateral kidney stones but no ureteral stones. 3. Trace pelvic effusion 4. Gallstones Electronically Signed   By: Jasmine Pang M.D.   On: 12/10/2020 21:19    Subjective: Patient was seen and examined today.  No new complaints.  We discussed about going home with Foley catheter, according to patient he has been discharged before with Foley catheter, he has a urologist, advised him to make an appointment earlier next week and follow-up the recommendations in terms of continuation of Foley versus voiding trial.  Patient normally does in and out catheter. Remained afebrile.  Discharge Exam: Vitals:   12/13/20 0407 12/13/20 0751  BP: 108/64 (!) 101/59  Pulse: 76 73  Resp: 17 16  Temp: 98.4 F (36.9 C) 98.9 F (37.2 C)  SpO2: 96% 95%   Vitals:   12/12/20 2026 12/13/20 0047 12/13/20 0407 12/13/20 0751  BP: 121/65 110/61 108/64 (!) 101/59  Pulse: 81 81 76 73  Resp: 17 16 17 16   Temp: 98.3 F (36.8 C) 98.8 F (37.1 C) 98.4 F (36.9 C) 98.9 F (37.2 C)  TempSrc:    Oral  SpO2: 97% 95% 96% 95%  Weight:      Height:        General: Pt is alert, awake, not in acute distress Cardiovascular: RRR, S1/S2 +, no rubs, no gallops Respiratory: CTA bilaterally, no wheezing, no rhonchi Abdominal: Soft, NT, ND, bowel sounds + Extremities: no edema, no cyanosis   The results of significant diagnostics from this hospitalization (including imaging, microbiology, ancillary and laboratory) are listed below for reference.    Microbiology: Recent Results (from the past 240 hour(s))  Culture, blood (Routine x 2)     Status: None (Preliminary result)    Collection Time: 12/10/20  7:36 PM   Specimen: BLOOD  Result Value Ref Range Status   Specimen Description BLOOD BLOOD RIGHT ARM  Final   Special Requests   Final    BOTTLES DRAWN AEROBIC AND ANAEROBIC Blood Culture results may not be optimal due to an excessive volume of blood received in culture bottles   Culture   Final    NO GROWTH 3 DAYS Performed at [facility], [address], [place], [place] [address]    Report Status PENDING  Incomplete  Culture, blood (Routine x 2)     Status: None (Preliminary result)   Collection Time: 12/10/20  7:36 PM   Specimen: BLOOD  Result Value Ref Range Status   Specimen Description BLOOD RIGHT ANTECUBITAL  Final   Special Requests   Final    BOTTLES DRAWN AEROBIC AND ANAEROBIC Blood Culture results may not be optimal due to an excessive volume of blood received in culture bottles   Culture  Setup Time Organism ID to follow NO ORGANISMS SEEN   Final   Culture   Final    NO GROWTH 3 DAYS Performed at [facility], [address], [place], [place] [address]    Report Status PENDING  Incomplete  Urine Culture     Status: Abnormal   Collection Time: 12/10/20  7:36 PM   Specimen: Urine, Random  Result Value Ref Range Status   Specimen Description   Final    URINE, RANDOM Performed at [facility], [address] [place], [place] [address]    Special Requests   Final    NONE Performed at [facility], [address] [place], [place] [address]    Culture MULTIPLE SPECIES PRESENT, SUGGEST RECOLLECTION (A)  Final   Report Status 12/12/2020 FINAL  Final  Blood Culture ID Panel (Reflexed)     Status: None   Collection Time: 12/10/20  7:36 PM  Result Value Ref Range Status   Enterococcus faecalis NOT DETECTED NOT DETECTED Final   Enterococcus Faecium NOT DETECTED NOT DETECTED Final   Listeria monocytogenes NOT DETECTED NOT DETECTED Final   Staphylococcus species NOT DETECTED NOT DETECTED Final    Staphylococcus aureus (BCID) NOT DETECTED NOT DETECTED Final   Staphylococcus epidermidis NOT DETECTED NOT DETECTED Final   Staphylococcus lugdunensis NOT DETECTED NOT DETECTED Final   Streptococcus species NOT DETECTED NOT DETECTED Final   Streptococcus agalactiae NOT DETECTED NOT DETECTED Final   Streptococcus pneumoniae NOT DETECTED NOT DETECTED Final   Streptococcus pyogenes NOT DETECTED NOT DETECTED Final   A.calcoaceticus-baumannii NOT DETECTED NOT DETECTED Final   Bacteroides fragilis NOT DETECTED NOT DETECTED Final   Enterobacterales NOT DETECTED NOT DETECTED Final   Enterobacter cloacae complex NOT DETECTED NOT DETECTED Final   Escherichia coli NOT DETECTED NOT DETECTED Final   Klebsiella aerogenes NOT DETECTED NOT DETECTED Final   Klebsiella oxytoca NOT DETECTED NOT DETECTED Final   Klebsiella pneumoniae NOT DETECTED NOT DETECTED Final   Proteus species NOT DETECTED NOT DETECTED Final   Salmonella species NOT DETECTED NOT DETECTED Final   Serratia marcescens NOT DETECTED NOT DETECTED Final   Haemophilus influenzae NOT DETECTED NOT DETECTED Final   Neisseria meningitidis NOT DETECTED NOT DETECTED Final   Pseudomonas aeruginosa NOT DETECTED NOT DETECTED Final   Stenotrophomonas maltophilia NOT DETECTED NOT DETECTED Final   Candida albicans NOT DETECTED NOT DETECTED Final   Candida auris NOT DETECTED NOT DETECTED Final   Candida glabrata NOT DETECTED NOT DETECTED Final   Candida krusei NOT DETECTED NOT DETECTED Final   Candida parapsilosis NOT DETECTED NOT DETECTED Final   Candida tropicalis NOT DETECTED NOT DETECTED Final   Cryptococcus neoformans/gattii NOT DETECTED NOT DETECTED Final    Comment: Performed at [facility], [address] [place], [place] [address]  Resp Panel by RT-PCR (Flu A&B, Covid) Nasopharyngeal Swab     Status: None   Collection Time: 12/10/20  8:38 PM   Specimen: Nasopharyngeal Swab; Nasopharyngeal(NP) swabs in vial transport medium  Result  Value Ref Range Status   SARS Coronavirus 2 by RT PCR NEGATIVE NEGATIVE Final    Comment: (NOTE) SARS-CoV-2 target nucleic acids are NOT DETECTED.  The SARS-CoV-2 RNA is generally detectable in upper respiratory specimens during the acute phase of infection. The lowest concentration of SARS-CoV-2 viral copies this assay can detect is 138 copies/mL. A negative result does not preclude SARS-Cov-2 infection and should not be used as the sole basis for treatment or other patient management decisions. A negative result may occur with  improper specimen collection/handling, submission of specimen other than nasopharyngeal swab, presence of viral mutation(s) within the areas targeted by this assay, and inadequate number of viral copies(<138 copies/mL). A negative result must be combined with clinical observations, patient history, and epidemiological information. The expected result is Negative.  Fact Sheet for Patients:  BloggerCourse.com  Fact Sheet for Healthcare Providers:  SeriousBroker.it  This test is no t yet approved or cleared by the [place] and  has been authorized for  detection and/or diagnosis of SARS-CoV-2 by FDA under an Emergency Use Authorization (EUA). This EUA will remain  in effect (meaning this test can be used) for the duration of the COVID-19 declaration under Section 564(b)(1) of the Act, 21 U.S.C.section 360bbb-3(b)(1), unless the authorization is terminated  or revoked sooner.       Influenza A by PCR NEGATIVE NEGATIVE Final   Influenza B by PCR NEGATIVE NEGATIVE Final    Comment: (NOTE) The Xpert Xpress SARS-CoV-2/FLU/RSV plus assay is intended as an aid in the diagnosis of influenza from Nasopharyngeal swab specimens and should not be used as a sole basis for treatment. Nasal washings and aspirates are unacceptable for Xpert Xpress SARS-CoV-2/FLU/RSV testing.  Fact Sheet for  Patients: BloggerCourse.com  Fact Sheet for Healthcare Providers: SeriousBroker.it  This test is not yet approved or cleared by the [place] FDA and has been authorized for detection and/or diagnosis of SARS-CoV-2 by FDA under an Emergency Use Authorization (EUA). This EUA will remain in effect (meaning this test can be used) for the duration of the COVID-19 declaration under Section 564(b)(1) of the Act, 21 U.S.C. section 360bbb-3(b)(1), unless the authorization is terminated or revoked.  Performed at [facility], [address] [place], [place] [address]      Labs: BNP (last 3 results) Recent Labs    10/08/20 1248  BNP 36.0   Basic Metabolic Panel: Recent Labs  Lab 12/10/20 1936 12/11/20 0108 12/13/20 0610  NA 133*  --  136  K 3.6  --  3.9  CL 99  --  104  CO2 23  --  24  GLUCOSE 126*  --  130*  BUN 17  --  18  CREATININE 0.61 0.61 0.60*  CALCIUM 8.8*  --  8.3*   Liver Function Tests: Recent Labs  Lab 12/10/20 1936  AST 23  ALT 19  ALKPHOS 88  BILITOT 0.6  PROT 7.1  ALBUMIN 3.8   No results for input(s): LIPASE, AMYLASE in the last 168 hours. No results for input(s): AMMONIA in the last 168 hours. CBC: Recent Labs  Lab 12/10/20 1936 12/11/20 0108 12/13/20 0610  WBC 12.4* 14.7* 7.9  NEUTROABS 11.4*  --   --   HGB 12.1* 11.9* 10.9*  HCT 35.0* 35.6* 32.3*  MCV 90.7 93.2 92.6  PLT 155 135* 153   Cardiac Enzymes: No results for input(s): CKTOTAL, CKMB, CKMBINDEX, TROPONINI in the last 168 hours. BNP: Invalid input(s): POCBNP CBG: Recent Labs  Lab 12/12/20 0731 12/12/20 1137 12/12/20 1724 12/12/20 2050 12/13/20 0747  GLUCAP 125* 207* 163* 121* 123*   D-Dimer No results for input(s): DDIMER in the last 72 hours. Hgb A1c Recent Labs    12/11/20 0108  HGBA1C 5.3   Lipid Profile No results for input(s): CHOL, HDL, LDLCALC, TRIG, CHOLHDL, LDLDIRECT in the last 72  hours. Thyroid function studies No results for input(s): TSH, T4TOTAL, T3FREE, THYROIDAB in the last 72 hours.  Invalid input(s): FREET3 Anemia work up No results for input(s): VITAMINB12, FOLATE, FERRITIN, TIBC, IRON, RETICCTPCT in the last 72 hours. Urinalysis    Component Value Date/Time   COLORURINE YELLOW (A) 12/10/2020 1929   APPEARANCEUR CLOUDY (A) 12/10/2020 1929   LABSPEC 1.030 12/10/2020 1929   PHURINE 7.0 12/10/2020 1929   GLUCOSEU NEGATIVE 12/10/2020 1929   HGBUR SMALL (A) 12/10/2020 1929   BILIRUBINUR NEGATIVE 12/10/2020 1929   KETONESUR NEGATIVE 12/10/2020 1929   PROTEINUR NEGATIVE 12/10/2020 1929   NITRITE POSITIVE (A) 12/10/2020 1929  LEUKOCYTESUR LARGE (A) 12/10/2020 1929   Sepsis Labs Invalid input(s): PROCALCITONIN,  WBC,  LACTICIDVEN Microbiology Recent Results (from the past 240 hour(s))  Culture, blood (Routine x 2)     Status: None (Preliminary result)   Collection Time: 12/10/20  7:36 PM   Specimen: BLOOD  Result Value Ref Range Status   Specimen Description BLOOD BLOOD RIGHT ARM  Final   Special Requests   Final    BOTTLES DRAWN AEROBIC AND ANAEROBIC Blood Culture results may not be optimal due to an excessive volume of blood received in culture bottles   Culture   Final    NO GROWTH 3 DAYS Performed at [facility], [address] [place], [place] [address]    Report Status PENDING  Incomplete  Culture, blood (Routine x 2)     Status: None (Preliminary result)   Collection Time: 12/10/20  7:36 PM   Specimen: BLOOD  Result Value Ref Range Status   Specimen Description BLOOD RIGHT ANTECUBITAL  Final   Special Requests   Final    BOTTLES DRAWN AEROBIC AND ANAEROBIC Blood Culture results may not be optimal due to an excessive volume of blood received in culture bottles   Culture  Setup Time Organism ID to follow NO ORGANISMS SEEN   Final   Culture   Final    NO GROWTH 3 DAYS Performed at [facility], [address]  [place], [place] [address]    Report Status PENDING  Incomplete  Urine Culture     Status: Abnormal   Collection Time: 12/10/20  7:36 PM   Specimen: Urine, Random  Result Value Ref Range Status   Specimen Description   Final    URINE, RANDOM Performed at [facility], [address] [place], [place] [address]    Special Requests   Final    NONE Performed at [facility], [address] [place], [place] [address]    Culture MULTIPLE SPECIES PRESENT, SUGGEST RECOLLECTION (A)  Final   Report Status 12/12/2020 FINAL  Final  Blood Culture ID Panel (Reflexed)     Status: None   Collection Time: 12/10/20  7:36 PM  Result Value Ref Range Status   Enterococcus faecalis NOT DETECTED NOT DETECTED Final   Enterococcus Faecium NOT DETECTED NOT DETECTED Final   Listeria monocytogenes NOT DETECTED NOT DETECTED Final   Staphylococcus species NOT DETECTED NOT DETECTED Final   Staphylococcus aureus (BCID) NOT DETECTED NOT DETECTED Final   Staphylococcus epidermidis NOT DETECTED NOT DETECTED Final   Staphylococcus lugdunensis NOT DETECTED NOT DETECTED Final   Streptococcus species NOT DETECTED NOT DETECTED Final   Streptococcus agalactiae NOT DETECTED NOT DETECTED Final   Streptococcus pneumoniae NOT DETECTED NOT DETECTED Final   Streptococcus pyogenes NOT DETECTED NOT DETECTED Final   A.calcoaceticus-baumannii NOT DETECTED NOT DETECTED Final   Bacteroides fragilis NOT DETECTED NOT DETECTED Final   Enterobacterales NOT DETECTED NOT DETECTED Final   Enterobacter cloacae complex NOT DETECTED NOT DETECTED Final   Escherichia coli NOT DETECTED NOT DETECTED Final   Klebsiella aerogenes NOT DETECTED NOT DETECTED Final   Klebsiella oxytoca NOT DETECTED NOT DETECTED Final   Klebsiella pneumoniae NOT DETECTED NOT DETECTED Final   Proteus species NOT DETECTED NOT DETECTED Final   Salmonella species NOT DETECTED NOT DETECTED Final   Serratia marcescens NOT DETECTED NOT DETECTED Final    Haemophilus influenzae NOT DETECTED NOT DETECTED Final   Neisseria meningitidis NOT DETECTED NOT DETECTED Final   Pseudomonas aeruginosa NOT  DETECTED NOT DETECTED Final   Stenotrophomonas maltophilia NOT DETECTED NOT DETECTED Final   Candida albicans NOT DETECTED NOT DETECTED Final   Candida auris NOT DETECTED NOT DETECTED Final   Candida glabrata NOT DETECTED NOT DETECTED Final   Candida krusei NOT DETECTED NOT DETECTED Final   Candida parapsilosis NOT DETECTED NOT DETECTED Final   Candida tropicalis NOT DETECTED NOT DETECTED Final   Cryptococcus neoformans/gattii NOT DETECTED NOT DETECTED Final    Comment: Performed at [facility], [address] [place], [place] [address]  Resp Panel by RT-PCR (Flu A&B, Covid) Nasopharyngeal Swab     Status: None   Collection Time: 12/10/20  8:38 PM   Specimen: Nasopharyngeal Swab; Nasopharyngeal(NP) swabs in vial transport medium  Result Value Ref Range Status   SARS Coronavirus 2 by RT PCR NEGATIVE NEGATIVE Final    Comment: (NOTE) SARS-CoV-2 target nucleic acids are NOT DETECTED.  The SARS-CoV-2 RNA is generally detectable in upper respiratory specimens during the acute phase of infection. The lowest concentration of SARS-CoV-2 viral copies this assay can detect is 138 copies/mL. A negative result does not preclude SARS-Cov-2 infection and should not be used as the sole basis for treatment or other patient management decisions. A negative result may occur with  improper specimen collection/handling, submission of specimen other than nasopharyngeal swab, presence of viral mutation(s) within the areas targeted by this assay, and inadequate number of viral copies(<138 copies/mL). A negative result must be combined with clinical observations, patient history, and epidemiological information. The expected result is Negative.  Fact Sheet for Patients:  BloggerCourse.com  Fact Sheet for Healthcare Providers:   SeriousBroker.it  This test is no t yet approved or cleared by the [place] FDA and  has been authorized for detection and/or diagnosis of SARS-CoV-2 by FDA under an Emergency Use Authorization (EUA). This EUA will remain  in effect (meaning this test can be used) for the duration of the COVID-19 declaration under Section 564(b)(1) of the Act, 21 U.S.C.section 360bbb-3(b)(1), unless the authorization is terminated  or revoked sooner.       Influenza A by PCR NEGATIVE NEGATIVE Final   Influenza B by PCR NEGATIVE NEGATIVE Final    Comment: (NOTE) The Xpert Xpress SARS-CoV-2/FLU/RSV plus assay is intended as an aid in the diagnosis of influenza from Nasopharyngeal swab specimens and should not be used as a sole basis for treatment. Nasal washings and aspirates are unacceptable for Xpert Xpress SARS-CoV-2/FLU/RSV testing.  Fact Sheet for Patients: BloggerCourse.com  Fact Sheet for Healthcare Providers: SeriousBroker.it  This test is not yet approved or cleared by the [place] FDA and has been authorized for detection and/or diagnosis of SARS-CoV-2 by FDA under an Emergency Use Authorization (EUA). This EUA will remain in effect (meaning this test can be used) for the duration of the COVID-19 declaration under Section 564(b)(1) of the Act, 21 U.S.C. section 360bbb-3(b)(1), unless the authorization is terminated or revoked.  Performed at [facility], [address] [place], [place] [address]     Time coordinating discharge: Over 30 minutes  SIGNED:  Arnetha Courser, MD  [facility] 12/13/2020, 10:02 AM  If 7PM-7AM, please contact night-coverage [email or web address]  This record has been created using Conservation officer, historic buildings. Errors have been sought and corrected,but may not always be located. Such creation errors do not reflect on the standard of care.

## 2020-12-13 NOTE — Discharge Instructions (Signed)
Please make an appointment with your urologist in 1 week, as you are being discharged with Foley catheter.

## 2020-12-13 NOTE — Progress Notes (Signed)
VSS, IV caths removed site is c/d/I. Pt d/c with foley cath. F/u in one week with urology. Pt verbalized understanding of the d/c packet. 3 scripts electronically sent to pharmacy for pick up. Pt transported via W/c down to private car.

## 2020-12-15 LAB — CULTURE, BLOOD (ROUTINE X 2)
Culture: NO GROWTH
Culture: NO GROWTH

## 2020-12-18 DIAGNOSIS — E118 Type 2 diabetes mellitus with unspecified complications: Secondary | ICD-10-CM | POA: Diagnosis not present

## 2020-12-23 ENCOUNTER — Other Ambulatory Visit: Payer: Self-pay

## 2020-12-23 ENCOUNTER — Ambulatory Visit: Payer: Medicare HMO | Admitting: Physician Assistant

## 2020-12-23 DIAGNOSIS — G40309 Generalized idiopathic epilepsy and epileptic syndromes, not intractable, without status epilepticus: Secondary | ICD-10-CM | POA: Diagnosis not present

## 2020-12-23 DIAGNOSIS — E118 Type 2 diabetes mellitus with unspecified complications: Secondary | ICD-10-CM | POA: Diagnosis not present

## 2020-12-23 DIAGNOSIS — E871 Hypo-osmolality and hyponatremia: Secondary | ICD-10-CM | POA: Diagnosis not present

## 2020-12-23 DIAGNOSIS — F329 Major depressive disorder, single episode, unspecified: Secondary | ICD-10-CM | POA: Diagnosis not present

## 2020-12-23 DIAGNOSIS — E1342 Other specified diabetes mellitus with diabetic polyneuropathy: Secondary | ICD-10-CM | POA: Diagnosis not present

## 2020-12-23 DIAGNOSIS — R339 Retention of urine, unspecified: Secondary | ICD-10-CM | POA: Diagnosis not present

## 2020-12-23 DIAGNOSIS — I10 Essential (primary) hypertension: Secondary | ICD-10-CM | POA: Diagnosis not present

## 2020-12-23 DIAGNOSIS — E785 Hyperlipidemia, unspecified: Secondary | ICD-10-CM | POA: Diagnosis not present

## 2020-12-23 DIAGNOSIS — G4733 Obstructive sleep apnea (adult) (pediatric): Secondary | ICD-10-CM | POA: Diagnosis not present

## 2020-12-23 DIAGNOSIS — I872 Venous insufficiency (chronic) (peripheral): Secondary | ICD-10-CM | POA: Diagnosis not present

## 2020-12-23 NOTE — Progress Notes (Signed)
12/23/2020 9:48 AM   Edward Thompson 11-11-1950 [id number]  CC: Chief Complaint  Patient presents with   Urinary Retention    HPI: Edward Thompson is a 70 y.o. male with PMH recurrent urinary retention managed with CIC 3-4 times daily who was recently hospitalized with urosepsis and bilateral hydroureter with stable creatinine requiring Foley catheter placement who presents today for Foley catheter removal.  He is accompanied today by his daughter, who contributes to HPI.  Today he reports no acute concerns.  He states he desires to resume self-catheterization.  His daughter reports he sometimes reuses catheters, which she believes may have contributed to his recent infection.  He states he has plenty of extra supplies at home and is not at risk of running out of these.  He is scheduled for urodynamics in [place] on 01/01/2021.    PMH: Past Medical History:  Diagnosis Date   CAD (coronary artery disease)    Carotid stenosis, left 08/2017   Diabetes mellitus without complication (HCC)    Fracture of neck (HCC) 2008   fell off a roof. required halo x 4 months. also fractured alot of vertebrae   Heart attack (HCC) 2015   Heart disease    Hepatitis    6th grade    Hyperlipidemia    Hypertension    Myocardial infarction acute (HCC) 2015   Seizures (HCC)    taking phenobarbitol and dilantin. LAST SEIZURE WAS 2016. well controlled on meds   Sleep apnea    USES CPAP   Syncope 2019   Vertigo    Viral meningitis     Surgical History: Past Surgical History:  Procedure Laterality Date   CARDIAC SURGERY     CORONARY ANGIOPLASTY     PATIENT UNAWARE OF THIS   CORONARY ARTERY BYPASS GRAFT  2015   ENDARTERECTOMY Left 08/19/2017   Procedure: ENDARTERECTOMY CAROTID;  Surgeon: Renford Dills, MD;  Location: [facility] ORS;  Service: Vascular;  Laterality: Left;   HERNIA REPAIR Right 1985   inguinal    Home Medications:  Allergies as of 12/23/2020   No Known Allergies       Medication List        Accurate as of December 23, 2020  9:48 AM. If you have any questions, ask your nurse or doctor.          aspirin EC 81 MG tablet Take 81 mg by mouth at bedtime.   clopidogrel 75 MG tablet Commonly known as: PLAVIX Take 75 mg by mouth at bedtime.   ezetimibe 10 MG tablet Commonly known as: ZETIA Take 10 mg by mouth daily.   feeding supplement (GLUCERNA SHAKE) Liqd Take 237 mLs by mouth 2 (two) times daily between meals.   isosorbide mononitrate 30 MG 24 hr tablet Commonly known as: IMDUR Take 30 mg by mouth at bedtime.   meclizine 25 MG tablet Commonly known as: ANTIVERT Take 50 mg by mouth 2 (two) times daily as needed for dizziness.   metFORMIN 500 MG tablet Commonly known as: GLUCOPHAGE Take 500 mg by mouth daily.   metoprolol succinate 25 MG 24 hr tablet Commonly known as: TOPROL-XL Take 0.5 tablets (12.5 mg total) by mouth daily.   multivitamin tablet Take 1 tablet by mouth daily.   nitroGLYCERIN 0.4 MG SL tablet Commonly known as: NITROSTAT Place 0.4 mg under the tongue every 5 (five) minutes as needed for chest pain.   PHENobarbital 64.8 MG tablet Commonly known as: LUMINAL Take 2 tablets (129.6  mg total) by mouth at bedtime.   phenytoin 100 MG ER capsule Commonly known as: DILANTIN Take 300 mg by mouth at bedtime.   polyethylene glycol 17 g packet Commonly known as: MIRALAX / GLYCOLAX Take 17 g by mouth daily.   rosuvastatin 40 MG tablet Commonly known as: CRESTOR Take 40 mg by mouth at bedtime.   tamsulosin 0.4 MG Caps capsule Commonly known as: FLOMAX Take 1 capsule (0.4 mg total) by mouth daily.        Allergies:  No Known Allergies  Family History: Family History  Problem Relation Age of Onset   Heart disease Mother    Heart attack Father     Social History:   reports that he has never smoked. He has never used smokeless tobacco. He reports that he does not drink alcohol and does not use  drugs.  Physical Exam: There were no vitals taken for this visit.  Constitutional:  Alert and oriented, no acute distress, nontoxic appearing HEENT: [place], AT Cardiovascular: No clubbing, cyanosis, or edema Respiratory: Normal respiratory effort, no increased work of breathing Skin: No rashes, bruises or suspicious lesions Neurologic: Grossly intact, no focal deficits, moving all 4 extremities Psychiatric: Normal mood and affect  Catheter Removal  Patient is present today for a catheter removal.  10ml of water was drained from the balloon. A 16FR coud foley cath was removed from the bladder no complications were noted . Patient tolerated well.  Performed by: Carman Ching, PA-C   Assessment & Plan:   1. Urinary retention Foley catheter removed in clinic today, see procedure note above. Patient to resume CIC 3-4x daily. Counseled him to never reuse catheters as these likely contributed to infection and contact clinic if he is ever at risk for running low on supplies. He expressed understanding.  Pt to proceed with scheduled UDS. Scheduled results visit today.  Return in about 3 weeks (around 01/13/2021) for UDS results with Dr. Lonna Cobb.  Carman Ching, PA-C  [facility] [address] [place], [place] [address] [phone]

## 2021-01-01 DIAGNOSIS — R338 Other retention of urine: Secondary | ICD-10-CM | POA: Diagnosis not present

## 2021-01-05 ENCOUNTER — Other Ambulatory Visit: Payer: Self-pay | Admitting: Urology

## 2021-01-08 ENCOUNTER — Ambulatory Visit (INDEPENDENT_AMBULATORY_CARE_PROVIDER_SITE_OTHER): Payer: Medicare HMO | Admitting: Urology

## 2021-01-08 ENCOUNTER — Other Ambulatory Visit: Payer: Self-pay

## 2021-01-08 DIAGNOSIS — N401 Enlarged prostate with lower urinary tract symptoms: Secondary | ICD-10-CM | POA: Diagnosis not present

## 2021-01-08 DIAGNOSIS — R339 Retention of urine, unspecified: Secondary | ICD-10-CM | POA: Diagnosis not present

## 2021-01-08 NOTE — Progress Notes (Signed)
Virtual Visit via Telephone Note  I connected with Edward Thompson on 01/08/21 at  3:15 PM EDT by telephone and verified that I am speaking with the correct person using two identifiers.  Location: Patient: Home Provider: [facility] Participants: Patient and daughter   I discussed the limitations, risks, security and privacy concerns of performing an evaluation and management service by telephone and the availability of in person appointments. I also discussed with the patient that there may be a patient responsible charge related to this service. The patient expressed understanding and agreed to proceed.   History of Present Illness: 70 y.o. male with recurrent urinary retention who has failed several voiding trials and currently on CIC.  Cystoscopy performed 11/05/2020 with only mild lateral lobe enlargement and a normal bladder neck.  They elected further evaluation with a urodynamic study which was performed at [facility] on 01/01/2021  Prior to study he was catheterized with 200 mL of urine obtained.  Max bladder capacity was 530 mL with sore for sensation occurring at 422 mL.  He had a strong desire with an uninhibited detrusor contraction shortly after for sensation.  Bladder was unstable with max detrusor contraction at 32 cm of water.  He was able to generate a voluntary contraction and voided 130 mL with a residual of ~ 466 mL.  Max detrusor pressure was 24 cm of water he was noted to have increased intermittent activity and surface electrodes during voiding   Observations/Objective: N/A  Assessment and Plan: Urinary retention with failed voiding trials Only mild lateral lobe enlargement on cystoscopy Urodynamic study with a hyposensitive bladder which was unstable however he was able to generate a detrusor contraction and voided 130 mL Discussed possibility of TUIP Would like to review urodynamic study with Dr. Sherron Monday prior to scheduling  Follow Up  Instructions: Will contact patient after I have spoken with Dr. Sherron Monday   I discussed the assessment and treatment plan with the patient. The patient was provided an opportunity to ask questions and all were answered. The patient agreed with the plan and demonstrated an understanding of the instructions.   The patient was advised to call back or seek an in-person evaluation if the symptoms worsen or if the condition fails to improve as anticipated.  I provided 15 minutes of non-face-to-face time during this encounter.   Riki Altes, MD

## 2021-01-09 ENCOUNTER — Telehealth: Payer: Self-pay | Admitting: Urology

## 2021-01-09 NOTE — Telephone Encounter (Signed)
Edward Thompson from [facility] [phone]) called and left a message with questions about a catheter order.

## 2021-01-11 ENCOUNTER — Encounter: Payer: Self-pay | Admitting: Urology

## 2021-01-13 DIAGNOSIS — R339 Retention of urine, unspecified: Secondary | ICD-10-CM | POA: Diagnosis not present

## 2021-01-13 NOTE — Telephone Encounter (Signed)
Order was received

## 2021-01-29 ENCOUNTER — Telehealth: Payer: Self-pay | Admitting: Pain Medicine

## 2021-01-29 NOTE — Telephone Encounter (Signed)
[name] (the patients daughter) called in and would like to know the status of a specialist follow-up in regards to looking at her fathers xrays.

## 2021-01-30 DIAGNOSIS — R339 Retention of urine, unspecified: Secondary | ICD-10-CM | POA: Diagnosis not present

## 2021-01-30 NOTE — Telephone Encounter (Signed)
Have you talked to Dr. Sherron Monday about the x-ray and possible TURIP?

## 2021-02-02 NOTE — Telephone Encounter (Signed)
Estimated there is ~ 60% chance that he will be able to adequately empty his bladder with an outlet procedure

## 2021-02-02 NOTE — Telephone Encounter (Signed)
Patient's daughter [name] notified and voiced understanding. She is asking when he needs to have a follow up office visit?

## 2021-02-04 NOTE — Telephone Encounter (Signed)
If they definitely want to proceed with surgery I can schedule and he can be seen for preop visit.  If they would prefer an appointment to discuss surgery prior to scheduling then that would also be fine.

## 2021-02-04 NOTE — Telephone Encounter (Signed)
[place] notified and will talk to him and call our office to let us know what the decision is.

## 2021-03-04 DIAGNOSIS — K625 Hemorrhage of anus and rectum: Secondary | ICD-10-CM | POA: Diagnosis not present

## 2021-03-04 DIAGNOSIS — R634 Abnormal weight loss: Secondary | ICD-10-CM | POA: Diagnosis not present

## 2021-03-06 DIAGNOSIS — I2510 Atherosclerotic heart disease of native coronary artery without angina pectoris: Secondary | ICD-10-CM | POA: Diagnosis not present

## 2021-03-06 DIAGNOSIS — E785 Hyperlipidemia, unspecified: Secondary | ICD-10-CM | POA: Diagnosis not present

## 2021-03-06 DIAGNOSIS — R0602 Shortness of breath: Secondary | ICD-10-CM | POA: Diagnosis not present

## 2021-03-06 DIAGNOSIS — I10 Essential (primary) hypertension: Secondary | ICD-10-CM | POA: Diagnosis not present

## 2021-03-10 ENCOUNTER — Encounter: Payer: Self-pay | Admitting: Internal Medicine

## 2021-03-11 ENCOUNTER — Encounter
Admission: RE | Disposition: A | Discharge status: Home / Self Care | Payer: Self-pay | Source: Home / Self Care | Attending: Internal Medicine

## 2021-03-11 ENCOUNTER — Encounter: Payer: Self-pay | Admitting: Internal Medicine

## 2021-03-11 ENCOUNTER — Ambulatory Visit
Admission: RE | Admit: 2021-03-11 | Discharge: 2021-03-11 | Disposition: A | Discharge status: Home / Self Care | Payer: Medicare HMO | Attending: Internal Medicine | Admitting: Internal Medicine

## 2021-03-11 ENCOUNTER — Ambulatory Visit: Payer: Medicare HMO | Admitting: Certified Registered"

## 2021-03-11 DIAGNOSIS — Z7982 Long term (current) use of aspirin: Secondary | ICD-10-CM | POA: Insufficient documentation

## 2021-03-11 DIAGNOSIS — E119 Type 2 diabetes mellitus without complications: Secondary | ICD-10-CM | POA: Diagnosis not present

## 2021-03-11 DIAGNOSIS — K648 Other hemorrhoids: Secondary | ICD-10-CM | POA: Diagnosis not present

## 2021-03-11 DIAGNOSIS — R6881 Early satiety: Secondary | ICD-10-CM | POA: Diagnosis not present

## 2021-03-11 DIAGNOSIS — K2289 Other specified disease of esophagus: Secondary | ICD-10-CM | POA: Insufficient documentation

## 2021-03-11 DIAGNOSIS — E785 Hyperlipidemia, unspecified: Secondary | ICD-10-CM | POA: Diagnosis not present

## 2021-03-11 DIAGNOSIS — K319 Disease of stomach and duodenum, unspecified: Secondary | ICD-10-CM | POA: Diagnosis not present

## 2021-03-11 DIAGNOSIS — K644 Residual hemorrhoidal skin tags: Secondary | ICD-10-CM | POA: Diagnosis not present

## 2021-03-11 DIAGNOSIS — K2100 Gastro-esophageal reflux disease with esophagitis, without bleeding: Secondary | ICD-10-CM | POA: Diagnosis not present

## 2021-03-11 DIAGNOSIS — K921 Melena: Secondary | ICD-10-CM | POA: Insufficient documentation

## 2021-03-11 DIAGNOSIS — I10 Essential (primary) hypertension: Secondary | ICD-10-CM | POA: Diagnosis not present

## 2021-03-11 DIAGNOSIS — K5900 Constipation, unspecified: Secondary | ICD-10-CM | POA: Insufficient documentation

## 2021-03-11 DIAGNOSIS — Z79899 Other long term (current) drug therapy: Secondary | ICD-10-CM | POA: Diagnosis not present

## 2021-03-11 DIAGNOSIS — Z538 Procedure and treatment not carried out for other reasons: Secondary | ICD-10-CM | POA: Diagnosis not present

## 2021-03-11 DIAGNOSIS — Z7902 Long term (current) use of antithrombotics/antiplatelets: Secondary | ICD-10-CM | POA: Insufficient documentation

## 2021-03-11 DIAGNOSIS — Z7984 Long term (current) use of oral hypoglycemic drugs: Secondary | ICD-10-CM | POA: Insufficient documentation

## 2021-03-11 DIAGNOSIS — K3189 Other diseases of stomach and duodenum: Secondary | ICD-10-CM | POA: Diagnosis not present

## 2021-03-11 DIAGNOSIS — R933 Abnormal findings on diagnostic imaging of other parts of digestive tract: Secondary | ICD-10-CM | POA: Diagnosis present

## 2021-03-11 DIAGNOSIS — Z951 Presence of aortocoronary bypass graft: Secondary | ICD-10-CM | POA: Insufficient documentation

## 2021-03-11 DIAGNOSIS — Z6829 Body mass index (BMI) 29.0-29.9, adult: Secondary | ICD-10-CM | POA: Insufficient documentation

## 2021-03-11 DIAGNOSIS — Z955 Presence of coronary angioplasty implant and graft: Secondary | ICD-10-CM | POA: Insufficient documentation

## 2021-03-11 DIAGNOSIS — K31A13 Gastric intestinal metaplasia without dysplasia, involving the fundus: Secondary | ICD-10-CM | POA: Diagnosis not present

## 2021-03-11 DIAGNOSIS — K2950 Unspecified chronic gastritis without bleeding: Secondary | ICD-10-CM | POA: Insufficient documentation

## 2021-03-11 DIAGNOSIS — R194 Change in bowel habit: Secondary | ICD-10-CM | POA: Diagnosis not present

## 2021-03-11 DIAGNOSIS — K641 Second degree hemorrhoids: Secondary | ICD-10-CM | POA: Insufficient documentation

## 2021-03-11 DIAGNOSIS — R634 Abnormal weight loss: Secondary | ICD-10-CM | POA: Diagnosis not present

## 2021-03-11 HISTORY — DX: Foot drop, unspecified foot: M21.379

## 2021-03-11 HISTORY — PX: ESOPHAGOGASTRODUODENOSCOPY: SHX5428

## 2021-03-11 HISTORY — PX: COLONOSCOPY WITH PROPOFOL: SHX5780

## 2021-03-11 LAB — GLUCOSE, CAPILLARY: Glucose-Capillary: 70 mg/dL (ref 70–99)

## 2021-03-11 SURGERY — EGD (ESOPHAGOGASTRODUODENOSCOPY)
Anesthesia: General

## 2021-03-11 MED ORDER — LIDOCAINE HCL (CARDIAC) PF 100 MG/5ML IV SOSY
PREFILLED_SYRINGE | INTRAVENOUS | Status: DC | PRN
  Administered 2021-03-11: 100 mg via INTRAVENOUS

## 2021-03-11 MED ORDER — PROPOFOL 500 MG/50ML IV EMUL
INTRAVENOUS | Status: DC | PRN
  Administered 2021-03-11: 150 ug/kg/min via INTRAVENOUS

## 2021-03-11 MED ORDER — SODIUM CHLORIDE 0.9 % IV SOLN
INTRAVENOUS | Status: DC
  Administered 2021-03-11: 20 mL/h via INTRAVENOUS

## 2021-03-11 MED ORDER — EPHEDRINE SULFATE 50 MG/ML IJ SOLN
INTRAMUSCULAR | Status: DC | PRN
  Administered 2021-03-11 (×2): 10 mg via INTRAVENOUS

## 2021-03-11 MED ORDER — PROPOFOL 10 MG/ML IV BOLUS
INTRAVENOUS | Status: AC
  Filled 2021-03-11: qty 40

## 2021-03-11 MED ORDER — PROPOFOL 10 MG/ML IV BOLUS
INTRAVENOUS | Status: DC | PRN
  Administered 2021-03-11: 50 mg via INTRAVENOUS

## 2021-03-11 NOTE — Interval H&P Note (Signed)
History and Physical Interval Note:  03/11/2021 8:48 AM  Edward Thompson  has presented today for surgery, with the diagnosis of WEIGHT LOSS RECTAL BLEED.  The various methods of treatment have been discussed with the patient and family. After consideration of risks, benefits and other options for treatment, the patient has consented to  Procedure(s) with comments: ESOPHAGOGASTRODUODENOSCOPY (EGD) (N/A) - DM COLONOSCOPY WITH PROPOFOL (N/A) as a surgical intervention.  The patient's history has been reviewed, patient examined, no change in status, stable for surgery.  I have reviewed the patient's chart and labs.  Questions were answered to the patient's satisfaction.     [place], [place]

## 2021-03-11 NOTE — H&P (Signed)
Outpatient short stay form Pre-procedure 03/11/2021 8:45 AM Edward Thompson K. Edward Thompson, M.D.  Primary Physician: S. Gillermo Murdoch, M.D.  Reason for visit:  Early satiety, abnormal CT of the GI tract, rectal bleeding, change in bowel habits.  History of present illness:  70 y/o male was seen in July 2022 with 16 lb weight loss over a 3 month period. Has recent hospitalization for SBO in the distal small bowel diagnosed by CT. Has mild recurrent constipation with scant , mild BRBPR. He has early satiety symptoms. No significant epigastric pain, heartburn, dysphagia, hemetemesis or melena.    Current Facility-Administered Medications:    0.9 %  sodium chloride infusion, , Intravenous, Continuous, Edward Thompson, Edward Nearing, MD  Medications Prior to Admission  Medication Sig Dispense Refill Last Dose   acetaminophen (TYLENOL) 500 MG tablet Take 500 mg by mouth every 6 (six) hours as needed.   03/10/2021   aspirin 325 MG EC tablet Take 325 mg by mouth daily.   Past Week   clopidogrel (PLAVIX) 75 MG tablet Take 75 mg by mouth at bedtime.    Past Week   ezetimibe (ZETIA) 10 MG tablet Take 10 mg by mouth daily.    03/10/2021   feeding supplement, GLUCERNA SHAKE, (GLUCERNA SHAKE) LIQD Take 237 mLs by mouth 2 (two) times daily between meals. 1000 mL 12 03/10/2021   isosorbide mononitrate (IMDUR) 30 MG 24 hr tablet Take 30 mg by mouth at bedtime.    03/10/2021   meclizine (ANTIVERT) 25 MG tablet Take 50 mg by mouth 2 (two) times daily as needed for dizziness.    03/10/2021   metFORMIN (GLUCOPHAGE) 500 MG tablet Take 500 mg by mouth daily.    03/10/2021   Multiple Vitamin (MULTIVITAMIN) tablet Take 1 tablet by mouth daily.   03/10/2021   nitroGLYCERIN (NITROSTAT) 0.4 MG SL tablet Place 0.4 mg under the tongue every 5 (five) minutes as needed for chest pain.    03/10/2021   PHENobarbital (LUMINAL) 64.8 MG tablet Take 2 tablets (129.6 mg total) by mouth at bedtime. 60 tablet 1 03/10/2021   phenytoin (DILANTIN) 100 MG ER  capsule Take 300 mg by mouth at bedtime.    03/10/2021   polyethylene glycol (MIRALAX / GLYCOLAX) 17 g packet Take 17 g by mouth daily. 14 each 0 03/10/2021   rosuvastatin (CRESTOR) 40 MG tablet Take 40 mg by mouth at bedtime.    03/10/2021   tamsulosin (FLOMAX) 0.4 MG CAPS capsule Take 1 capsule (0.4 mg total) by mouth daily. 30 capsule 0 03/10/2021   aspirin EC 81 MG tablet Take 81 mg by mouth at bedtime.       metoprolol succinate (TOPROL-XL) 25 MG 24 hr tablet Take 0.5 tablets (12.5 mg total) by mouth daily. 30 tablet 0      No Known Allergies   Past Medical History:  Diagnosis Date   CAD (coronary artery disease)    Carotid stenosis, left 08/2017   Diabetes mellitus without complication (HCC)    Foot drop    Fracture of neck (HCC) 2008   fell off a roof. required halo x 4 months. also fractured alot of vertebrae   Heart attack (HCC) 2015   Heart disease    Hepatitis    6th grade    Hyperlipidemia    Hypertension    Myocardial infarction acute (HCC) 2015   Seizures (HCC)    taking phenobarbitol and dilantin. LAST SEIZURE WAS 2016. well controlled on meds   Sleep apnea  USES CPAP   Syncope 2019   Vertigo    Viral meningitis     Review of systems:  Otherwise negative.    Physical Exam  Gen: Alert, oriented. Appears stated age.  HEENT: [place]/AT. PERRLA. Lungs: CTA, no wheezes. CV: RR nl S1, S2. Abd: soft, benign, no masses. BS+ Ext: No edema. Pulses 2+    Planned procedures: Proceed with EGD and colonoscopy. The patient understands the nature of the planned procedure, indications, risks, alternatives and potential complications including but not limited to bleeding, infection, perforation, damage to internal organs and possible oversedation/side effects from anesthesia. The patient agrees and gives consent to proceed.  Please refer to procedure notes for findings, recommendations and patient disposition/instructions.     Edward Thompson K. Edward Thompson,  M.D. Gastroenterology 03/11/2021  8:45 AM

## 2021-03-11 NOTE — Op Note (Addendum)
[facility] Patient Name: Edward Thompson Procedure Date: 03/11/2021 9:30 AM MRN: [id number] Account #: [id number] Date of Birth: 02/12/1951 Admit Type: Outpatient Age: 69 Room: [facility] ENDO ROOM 2 Gender: Male Note Status: Finalized Instrument Name: Nelda Marseille [id number] Procedure:             Colonoscopy Indications:           Hematochezia, Change in bowel habits Providers:             Boykin Nearing. Norma Fredrickson MD, MD Referring MD:          Silas Flood. Ellsworth Lennox, MD (Referring MD) Medicines:             Propofol per Anesthesia Complications:         No immediate complications. Procedure:             Pre-Anesthesia Assessment:                        - The risks and benefits of the procedure and the                         sedation options and risks were discussed with the                         patient. All questions were answered and informed                         consent was obtained.                        - Patient identification and proposed procedure were                         verified prior to the procedure by the nurse. The                         procedure was verified in the procedure room.                        - ASA Grade Assessment: III - A patient with severe                         systemic disease.                        - After reviewing the risks and benefits, the patient                         was deemed in satisfactory condition to undergo the                         procedure.                        After obtaining informed consent, the colonoscope was                         passed under direct vision. Throughout the procedure,  the patient's blood pressure, pulse, and oxygen                         saturations were monitored continuously. The                         Colonoscope was introduced through the anus with the                         intention of advancing to the cecum. The scope was                          advanced to the hepatic flexure before the procedure                         was aborted. Medications were given. The colonoscopy                         was technically difficult and complex due to a                         redundant colon, significant looping and a tortuous                         colon. The patient tolerated the procedure well. The                         quality of the bowel preparation was good. Hepatic                         flexure were photographed. Findings:      The perianal exam findings include internal hemorrhoids that prolapse       with straining, but spontaneously regress to the resting position (Grade       II).      Non-bleeding internal hemorrhoids were found during retroflexion. The       hemorrhoids were Grade II (internal hemorrhoids that prolapse but reduce       spontaneously).      No other significant abnormalities were identified in a careful       examination of the remainder of the colon.      The procedure was technically difficult and complex due to unusual       anatomy, significant looping and a tortuous colon. Successful completion       of the procedure was aided by {skip}.      The procedure was aborted after reaching the hepatic flexure.The       examined portion of the colon, except hemorrhoids, was unremarkable.       Estimated blood loss: none. Impression:            - Internal hemorrhoids that prolapse with straining,                         but spontaneously regress to the resting position                         (Grade II) found on perianal exam.                        -  Non-bleeding internal hemorrhoids.                        - No specimens collected. Recommendation:        - Patient has a contact number available for                         emergencies. The signs and symptoms of potential                         delayed complications were discussed with the patient.                         Return to normal activities  tomorrow. Written                         discharge instructions were provided to the patient.                        - Resume previous diet.                        - Continue present medications.                        - Perform a virtual colonoscopy at appointment to be                         scheduled.                        - IF any abnormalities in the right colon on CT                         colonography, consider referral to advanced                         endoscopist to pursue another colonoscopy.                        - Await pathology results from EGD, also performed                         today.                        - The findings and recommendations were discussed with                         the patient.                        - Return to physician assistant in 3 months.                        - Follow up with Tawni Pummel, PA-C at [facility]. [phone]. Procedure Code(s):     --- Professional ---  51761, 53, Colonoscopy, flexible; diagnostic,                         including collection of specimen(s) by brushing or                         washing, when performed (separate procedure) Diagnosis Code(s):     --- Professional ---                        R19.4, Change in bowel habit                        K92.1, Melena (includes Hematochezia)                        K64.1, Second degree hemorrhoids CPT copyright 2019 [employer]. All rights reserved. The codes documented in this report are preliminary and upon coder review may  be revised to meet current compliance requirements. Stanton Kidney MD, MD 03/11/2021 10:27:32 AM This report has been signed electronically. Number of Addenda: 0 Note Initiated On: 03/11/2021 9:30 AM Scope Withdrawal Time: 0 hours 0 minutes 7 seconds  Total Procedure Duration: 0 hours 26 minutes 13 seconds  Estimated Blood Loss:  Estimated blood loss:  none.      [facility]

## 2021-03-11 NOTE — Op Note (Addendum)
[name] Procedure Date: 03/11/2021 9:30 AM MRN: [id number] Account #: [id number] Date of Birth: 1950-07-25 Admit Type: Outpatient Age: 70 Room: [facility] ENDO ROOM 2 Gender: Male Note Status: Finalized Instrument Name: Upper Endoscope [id number] Procedure:             Upper GI endoscopy Indications:           Abnormal CT of the GI tract, Early satiety, Weight loss Providers:             Boykin Nearing. Norma Fredrickson MD, MD Referring MD:          Silas Flood. Ellsworth Lennox, MD (Referring MD) Medicines:             Propofol per Anesthesia Complications:         No immediate complications. Procedure:             Pre-Anesthesia Assessment:                        - The risks and benefits of the procedure and the                         sedation options and risks were discussed with the                         patient. All questions were answered and informed                         consent was obtained.                        - Patient identification and proposed procedure were                         verified prior to the procedure by the nurse. The                         procedure was verified in the procedure room.                        - ASA Grade Assessment: III - A patient with severe                         systemic disease.                        - After reviewing the risks and benefits, the patient                         was deemed in satisfactory condition to undergo the                         procedure.                        After obtaining informed consent, the endoscope was                         passed under direct vision. Throughout the procedure,  the patient's blood pressure, pulse, and oxygen                         saturations were monitored continuously. The Endoscope                         was introduced through the mouth, and advanced to the                         third part of duodenum. The  upper GI endoscopy was                         accomplished without difficulty. The patient tolerated                         the procedure well. Findings:      Two tongues of salmon-colored mucosa were present from 35 to 36 cm. No       other visible abnormalities were present. The maximum longitudinal       extent of these esophageal mucosal changes was 0.6 cm in length. Mucosa       was biopsied with a cold forceps for histology. One specimen bottle was       sent to pathology.      Diffuse atrophic mucosa was found in the gastric fundus. Biopsies were       taken with a cold forceps for histology.      A few 8 to 12 mm mucosal papules (nodules) with no bleeding and no       stigmata of recent bleeding were found in the prepyloric region of the       stomach. Biopsies were taken with a cold forceps for histology.      The examined duodenum was normal.      The exam was otherwise without abnormality. Impression:            - Salmon-colored mucosa suspicious for Barrett's                         esophagus. Biopsied.                        - Gastric mucosal atrophy. Biopsied.                        - A few mucosal papules (nodules) found in the                         stomach. Biopsied.                        - Normal examined duodenum.                        - The examination was otherwise normal. Recommendation:        - Await pathology results.                        - Proceed with colonoscopy Procedure Code(s):     --- Professional ---                        [phone],  Esophagogastroduodenoscopy, flexible,                         transoral; with biopsy, single or multiple Diagnosis Code(s):     --- Professional ---                        R93.3, Abnormal findings on diagnostic imaging of                         other parts of digestive tract                        R63.4, Abnormal weight loss                        R68.81, Early satiety                        K31.89, Other diseases of  stomach and duodenum                        K22.8, Other specified diseases of esophagus CPT copyright 2019 American Medical Association. All rights reserved. The codes documented in this report are preliminary and upon coder review may  be revised to meet current compliance requirements. Stanton Kidney MD, MD 03/11/2021 9:52:36 AM This report has been signed electronically. Number of Addenda: 0 Note Initiated On: 03/11/2021 9:30 AM Estimated Blood Loss:  Estimated blood loss: none.      [facility]

## 2021-03-11 NOTE — Transfer of Care (Signed)
Immediate Anesthesia Transfer of Care Note  Patient: Edward Thompson  Procedure(s) Performed: ESOPHAGOGASTRODUODENOSCOPY (EGD) COLONOSCOPY WITH PROPOFOL  Patient Location: PACU and Endoscopy Unit  Anesthesia Type:General  Level of Consciousness: drowsy  Airway & Oxygen Therapy: Patient Spontanous Breathing  Post-op Assessment: Report given to RN and Post -op Vital signs reviewed and stable  Post vital signs: Reviewed and stable  Last Vitals:  Vitals Value Taken Time  BP 111/61 03/11/21 1025  Temp    Pulse 74 03/11/21 1027  Resp 13 03/11/21 1027  SpO2 94 % 03/11/21 1027  Vitals shown include unvalidated device data.  Last Pain:  Vitals:   03/11/21 0843  TempSrc: Temporal  PainSc: 0-No pain         Complications: No notable events documented.

## 2021-03-11 NOTE — Anesthesia Preprocedure Evaluation (Signed)
Anesthesia Evaluation  Patient identified by MRN, date of birth, ID band Patient awake    Reviewed: Allergy & Precautions, H&P , NPO status , Patient's Chart, lab work & pertinent test results, reviewed documented beta blocker date and time   Airway Mallampati: II  TM Distance: >3 FB Neck ROM: full    Dental  (+) Dental Advidsory Given   Pulmonary neg shortness of breath, sleep apnea , neg COPD, neg recent URI,           Cardiovascular Exercise Tolerance: Good hypertension, On Medications (-) angina+ CAD, + Past MI, + Cardiac Stents, + CABG and + Peripheral Vascular Disease  (-) dysrhythmias (-) Valvular Problems/Murmurs     Neuro/Psych Seizures -,  negative psych ROS   GI/Hepatic negative GI ROS, Neg liver ROS,   Endo/Other  diabetes  Renal/GU negative Renal ROS  negative genitourinary   Musculoskeletal   Abdominal   Peds  Hematology negative hematology ROS (+)   Anesthesia Other Findings Past Medical History: No date: CAD (coronary artery disease) 08/2017: Carotid stenosis, left No date: Diabetes mellitus without complication (HCC) 2008: Fracture of neck (HCC)     Comment:  fell off a roof. required halo x 4 months. also               fractured alot of vertebrae 2015: Heart attack (HCC) No date: Hyperlipidemia No date: Hypertension No date: Myocardial infarction acute (HCC) No date: Seizures (HCC)     Comment:  taking phenobarbitol and dilantin. LAST SEIZURE WAS               2016. well controlled on meds No date: Sleep apnea     Comment:  USES CPAP 2019: Syncope No date: Vertigo No date: Viral meningitis   Reproductive/Obstetrics negative OB ROS                             Anesthesia Physical  Anesthesia Plan  ASA: 3  Anesthesia Plan: General   Post-op Pain Management:    Induction: Intravenous  PONV Risk Score and Plan: 2 and TIVA and Propofol infusion  Airway  Management Planned: Natural Airway and Nasal Cannula  Additional Equipment:   Intra-op Plan:   Post-operative Plan:   Informed Consent: I have reviewed the patients History and Physical, chart, labs and discussed the procedure including the risks, benefits and alternatives for the proposed anesthesia with the patient or authorized representative who has indicated his/her understanding and acceptance.     Dental Advisory Given  Plan Discussed with: Anesthesiologist, CRNA and Surgeon  Anesthesia Plan Comments:         Anesthesia Quick Evaluation

## 2021-03-11 NOTE — Anesthesia Procedure Notes (Signed)
Procedure Name: MAC Date/Time: 03/11/2021 9:40 AM Performed by: Biagio Borg, CRNA Pre-anesthesia Checklist: Patient identified, Emergency Drugs available, Suction available, Patient being monitored and Timeout performed Patient Re-evaluated:Patient Re-evaluated prior to induction Oxygen Delivery Method: Nasal cannula Induction Type: IV induction Placement Confirmation: positive ETCO2 and CO2 detector

## 2021-03-12 ENCOUNTER — Other Ambulatory Visit: Payer: Self-pay

## 2021-03-12 ENCOUNTER — Encounter: Payer: Self-pay | Admitting: Internal Medicine

## 2021-03-12 ENCOUNTER — Other Ambulatory Visit: Payer: Self-pay | Admitting: Gastroenterology

## 2021-03-12 DIAGNOSIS — R634 Abnormal weight loss: Secondary | ICD-10-CM

## 2021-03-12 DIAGNOSIS — R933 Abnormal findings on diagnostic imaging of other parts of digestive tract: Secondary | ICD-10-CM

## 2021-03-12 DIAGNOSIS — K625 Hemorrhage of anus and rectum: Secondary | ICD-10-CM

## 2021-03-13 LAB — SURGICAL PATHOLOGY

## 2021-03-13 NOTE — Anesthesia Postprocedure Evaluation (Signed)
Anesthesia Post Note  Patient: Edward Thompson  Procedure(s) Performed: ESOPHAGOGASTRODUODENOSCOPY (EGD) COLONOSCOPY WITH PROPOFOL  Patient location during evaluation: Endoscopy Anesthesia Type: General Level of consciousness: awake and alert Pain management: pain level controlled Vital Signs Assessment: post-procedure vital signs reviewed and stable Respiratory status: spontaneous breathing, nonlabored ventilation, respiratory function stable and patient connected to nasal cannula oxygen Cardiovascular status: blood pressure returned to baseline and stable Postop Assessment: no apparent nausea or vomiting Anesthetic complications: no   No notable events documented.   Last Vitals:  Vitals:   03/11/21 1050 03/11/21 1100  BP: 139/74 (!) 152/75  Pulse: 77 67  Resp: 12 12  Temp:    SpO2: 100% 100%    Last Pain:  Vitals:   03/11/21 1020  TempSrc: Temporal  PainSc:                  Lenard Simmer

## 2021-03-24 DIAGNOSIS — E118 Type 2 diabetes mellitus with unspecified complications: Secondary | ICD-10-CM | POA: Diagnosis not present

## 2021-03-24 DIAGNOSIS — I10 Essential (primary) hypertension: Secondary | ICD-10-CM | POA: Diagnosis not present

## 2021-03-24 DIAGNOSIS — E669 Obesity, unspecified: Secondary | ICD-10-CM | POA: Diagnosis not present

## 2021-03-24 DIAGNOSIS — E785 Hyperlipidemia, unspecified: Secondary | ICD-10-CM | POA: Diagnosis not present

## 2021-03-27 DIAGNOSIS — E871 Hypo-osmolality and hyponatremia: Secondary | ICD-10-CM | POA: Diagnosis not present

## 2021-03-27 DIAGNOSIS — K2101 Gastro-esophageal reflux disease with esophagitis, with bleeding: Secondary | ICD-10-CM | POA: Diagnosis not present

## 2021-03-27 DIAGNOSIS — E785 Hyperlipidemia, unspecified: Secondary | ICD-10-CM | POA: Diagnosis not present

## 2021-03-27 DIAGNOSIS — E118 Type 2 diabetes mellitus with unspecified complications: Secondary | ICD-10-CM | POA: Diagnosis not present

## 2021-03-27 DIAGNOSIS — E1342 Other specified diabetes mellitus with diabetic polyneuropathy: Secondary | ICD-10-CM | POA: Diagnosis not present

## 2021-03-27 DIAGNOSIS — I872 Venous insufficiency (chronic) (peripheral): Secondary | ICD-10-CM | POA: Diagnosis not present

## 2021-03-27 DIAGNOSIS — Z0001 Encounter for general adult medical examination with abnormal findings: Secondary | ICD-10-CM | POA: Diagnosis not present

## 2021-03-27 DIAGNOSIS — Z23 Encounter for immunization: Secondary | ICD-10-CM | POA: Diagnosis not present

## 2021-03-27 DIAGNOSIS — F329 Major depressive disorder, single episode, unspecified: Secondary | ICD-10-CM | POA: Diagnosis not present

## 2021-03-27 DIAGNOSIS — G4733 Obstructive sleep apnea (adult) (pediatric): Secondary | ICD-10-CM | POA: Diagnosis not present

## 2021-03-27 DIAGNOSIS — G40309 Generalized idiopathic epilepsy and epileptic syndromes, not intractable, without status epilepticus: Secondary | ICD-10-CM | POA: Diagnosis not present

## 2021-03-27 DIAGNOSIS — I10 Essential (primary) hypertension: Secondary | ICD-10-CM | POA: Diagnosis not present

## 2021-03-27 DIAGNOSIS — Z1331 Encounter for screening for depression: Secondary | ICD-10-CM | POA: Diagnosis not present

## 2021-03-27 DIAGNOSIS — K922 Gastrointestinal hemorrhage, unspecified: Secondary | ICD-10-CM | POA: Diagnosis not present

## 2021-05-07 ENCOUNTER — Other Ambulatory Visit: Payer: Self-pay

## 2021-05-07 ENCOUNTER — Inpatient Hospital Stay
Admission: EM | Admit: 2021-05-07 | Discharge: 2021-05-11 | DRG: 389 | Disposition: A | Discharge status: Home / Self Care | Payer: Medicare HMO | Attending: Internal Medicine | Admitting: Internal Medicine

## 2021-05-07 ENCOUNTER — Emergency Department: Payer: Medicare HMO

## 2021-05-07 DIAGNOSIS — I219 Acute myocardial infarction, unspecified: Secondary | ICD-10-CM | POA: Diagnosis not present

## 2021-05-07 DIAGNOSIS — Z4682 Encounter for fitting and adjustment of non-vascular catheter: Secondary | ICD-10-CM | POA: Diagnosis not present

## 2021-05-07 DIAGNOSIS — N319 Neuromuscular dysfunction of bladder, unspecified: Secondary | ICD-10-CM | POA: Diagnosis present

## 2021-05-07 DIAGNOSIS — I10 Essential (primary) hypertension: Secondary | ICD-10-CM | POA: Diagnosis not present

## 2021-05-07 DIAGNOSIS — I252 Old myocardial infarction: Secondary | ICD-10-CM | POA: Diagnosis not present

## 2021-05-07 DIAGNOSIS — G40909 Epilepsy, unspecified, not intractable, without status epilepticus: Secondary | ICD-10-CM | POA: Diagnosis present

## 2021-05-07 DIAGNOSIS — R609 Edema, unspecified: Secondary | ICD-10-CM

## 2021-05-07 DIAGNOSIS — K56699 Other intestinal obstruction unspecified as to partial versus complete obstruction: Secondary | ICD-10-CM | POA: Diagnosis not present

## 2021-05-07 DIAGNOSIS — K56609 Unspecified intestinal obstruction, unspecified as to partial versus complete obstruction: Secondary | ICD-10-CM | POA: Diagnosis not present

## 2021-05-07 DIAGNOSIS — Z993 Dependence on wheelchair: Secondary | ICD-10-CM

## 2021-05-07 DIAGNOSIS — Z955 Presence of coronary angioplasty implant and graft: Secondary | ICD-10-CM | POA: Diagnosis not present

## 2021-05-07 DIAGNOSIS — E860 Dehydration: Secondary | ICD-10-CM | POA: Diagnosis present

## 2021-05-07 DIAGNOSIS — K567 Ileus, unspecified: Secondary | ICD-10-CM | POA: Diagnosis not present

## 2021-05-07 DIAGNOSIS — Z8249 Family history of ischemic heart disease and other diseases of the circulatory system: Secondary | ICD-10-CM | POA: Diagnosis not present

## 2021-05-07 DIAGNOSIS — Z7982 Long term (current) use of aspirin: Secondary | ICD-10-CM

## 2021-05-07 DIAGNOSIS — E785 Hyperlipidemia, unspecified: Secondary | ICD-10-CM | POA: Diagnosis present

## 2021-05-07 DIAGNOSIS — Z7984 Long term (current) use of oral hypoglycemic drugs: Secondary | ICD-10-CM

## 2021-05-07 DIAGNOSIS — E663 Overweight: Secondary | ICD-10-CM | POA: Diagnosis present

## 2021-05-07 DIAGNOSIS — J9811 Atelectasis: Secondary | ICD-10-CM | POA: Diagnosis not present

## 2021-05-07 DIAGNOSIS — R0602 Shortness of breath: Secondary | ICD-10-CM | POA: Diagnosis not present

## 2021-05-07 DIAGNOSIS — Z20822 Contact with and (suspected) exposure to covid-19: Secondary | ICD-10-CM | POA: Diagnosis present

## 2021-05-07 DIAGNOSIS — E871 Hypo-osmolality and hyponatremia: Secondary | ICD-10-CM | POA: Diagnosis not present

## 2021-05-07 DIAGNOSIS — R1110 Vomiting, unspecified: Secondary | ICD-10-CM | POA: Diagnosis not present

## 2021-05-07 DIAGNOSIS — Z7902 Long term (current) use of antithrombotics/antiplatelets: Secondary | ICD-10-CM | POA: Diagnosis not present

## 2021-05-07 DIAGNOSIS — G4733 Obstructive sleep apnea (adult) (pediatric): Secondary | ICD-10-CM | POA: Diagnosis present

## 2021-05-07 DIAGNOSIS — E11649 Type 2 diabetes mellitus with hypoglycemia without coma: Secondary | ICD-10-CM | POA: Diagnosis not present

## 2021-05-07 DIAGNOSIS — R112 Nausea with vomiting, unspecified: Secondary | ICD-10-CM | POA: Diagnosis not present

## 2021-05-07 DIAGNOSIS — Z79899 Other long term (current) drug therapy: Secondary | ICD-10-CM

## 2021-05-07 DIAGNOSIS — R109 Unspecified abdominal pain: Secondary | ICD-10-CM | POA: Diagnosis not present

## 2021-05-07 DIAGNOSIS — I2510 Atherosclerotic heart disease of native coronary artery without angina pectoris: Secondary | ICD-10-CM | POA: Diagnosis present

## 2021-05-07 DIAGNOSIS — N390 Urinary tract infection, site not specified: Secondary | ICD-10-CM

## 2021-05-07 DIAGNOSIS — D649 Anemia, unspecified: Secondary | ICD-10-CM | POA: Diagnosis not present

## 2021-05-07 DIAGNOSIS — Z951 Presence of aortocoronary bypass graft: Secondary | ICD-10-CM

## 2021-05-07 DIAGNOSIS — K56600 Partial intestinal obstruction, unspecified as to cause: Principal | ICD-10-CM | POA: Diagnosis present

## 2021-05-07 DIAGNOSIS — I517 Cardiomegaly: Secondary | ICD-10-CM | POA: Diagnosis not present

## 2021-05-07 DIAGNOSIS — K6389 Other specified diseases of intestine: Secondary | ICD-10-CM | POA: Diagnosis not present

## 2021-05-07 DIAGNOSIS — R600 Localized edema: Secondary | ICD-10-CM | POA: Diagnosis not present

## 2021-05-07 DIAGNOSIS — E876 Hypokalemia: Secondary | ICD-10-CM | POA: Diagnosis not present

## 2021-05-07 DIAGNOSIS — K5939 Other megacolon: Secondary | ICD-10-CM | POA: Diagnosis not present

## 2021-05-07 DIAGNOSIS — Z6829 Body mass index (BMI) 29.0-29.9, adult: Secondary | ICD-10-CM

## 2021-05-07 DIAGNOSIS — I700 Atherosclerosis of aorta: Secondary | ICD-10-CM | POA: Diagnosis not present

## 2021-05-07 LAB — CBC
HCT: 38.2 % — ABNORMAL LOW (ref 39.0–52.0)
Hemoglobin: 12.9 g/dL — ABNORMAL LOW (ref 13.0–17.0)
MCH: 30.9 pg (ref 26.0–34.0)
MCHC: 33.8 g/dL (ref 30.0–36.0)
MCV: 91.6 fL (ref 80.0–100.0)
Platelets: 237 10*3/uL (ref 150–400)
RBC: 4.17 MIL/uL — ABNORMAL LOW (ref 4.22–5.81)
RDW: 13.7 % (ref 11.5–15.5)
WBC: 7.3 10*3/uL (ref 4.0–10.5)
nRBC: 0 % (ref 0.0–0.2)

## 2021-05-07 LAB — URINALYSIS, COMPLETE (UACMP) WITH MICROSCOPIC
Bilirubin Urine: NEGATIVE
Glucose, UA: NEGATIVE mg/dL
Ketones, ur: 20 mg/dL — AB
Nitrite: POSITIVE — AB
Protein, ur: 30 mg/dL — AB
Specific Gravity, Urine: 1.04 — ABNORMAL HIGH (ref 1.005–1.030)
pH: 5 (ref 5.0–8.0)

## 2021-05-07 LAB — COMPREHENSIVE METABOLIC PANEL
ALT: 21 U/L (ref 0–44)
AST: 21 U/L (ref 15–41)
Albumin: 3.8 g/dL (ref 3.5–5.0)
Alkaline Phosphatase: 111 U/L (ref 38–126)
Anion gap: 10 (ref 5–15)
BUN: 36 mg/dL — ABNORMAL HIGH (ref 8–23)
CO2: 25 mmol/L (ref 22–32)
Calcium: 9.2 mg/dL (ref 8.9–10.3)
Chloride: 94 mmol/L — ABNORMAL LOW (ref 98–111)
Creatinine, Ser: 0.65 mg/dL (ref 0.61–1.24)
GFR, Estimated: >60 mL/min (ref >60)
Glucose, Bld: 134 mg/dL — ABNORMAL HIGH (ref 70–99)
Potassium: 3.9 mmol/L (ref 3.5–5.1)
Sodium: 129 mmol/L — ABNORMAL LOW (ref 135–145)
Total Bilirubin: 1.3 mg/dL — ABNORMAL HIGH (ref 0.3–1.2)
Total Protein: 7.6 g/dL (ref 6.5–8.1)

## 2021-05-07 LAB — LACTIC ACID, PLASMA: Lactic Acid, Venous: 1.1 mmol/L (ref 0.5–1.9)

## 2021-05-07 LAB — TROPONIN I (HIGH SENSITIVITY): Troponin I (High Sensitivity): 8 ng/L (ref <18)

## 2021-05-07 MED ORDER — ONDANSETRON HCL 4 MG PO TABS: 4.0000 mg | ORAL_TABLET | Freq: Four times a day (QID) | ORAL | Status: DC | PRN

## 2021-05-07 MED ORDER — TRAZODONE HCL 50 MG PO TABS: 25.0000 mg | ORAL_TABLET | Freq: Every evening | ORAL | Status: DC | PRN

## 2021-05-07 MED ORDER — PANTOPRAZOLE SODIUM 40 MG PO TBEC: 40.0000 mg | DELAYED_RELEASE_TABLET | Freq: Every day | ORAL | Status: DC

## 2021-05-07 MED ORDER — ROSUVASTATIN CALCIUM 20 MG PO TABS
40.0000 mg | ORAL_TABLET | Freq: Every day | ORAL | Status: DC
  Filled 2021-05-07: qty 2

## 2021-05-07 MED ORDER — ENOXAPARIN SODIUM 40 MG/0.4ML IJ SOSY: 40.0000 mg | PREFILLED_SYRINGE | INTRAMUSCULAR | Status: DC

## 2021-05-07 MED ORDER — SODIUM CHLORIDE 0.9 % IV SOLN
1.0000 g | Freq: Once | INTRAVENOUS | Status: AC
  Administered 2021-05-08: 02:00:00 1 g via INTRAVENOUS
  Filled 2021-05-07: qty 10

## 2021-05-07 MED ORDER — TAMSULOSIN HCL 0.4 MG PO CAPS: 0.4000 mg | ORAL_CAPSULE | Freq: Every day | ORAL | Status: DC

## 2021-05-07 MED ORDER — ACETAMINOPHEN 650 MG RE SUPP: 650.0000 mg | Freq: Four times a day (QID) | RECTAL | Status: DC | PRN

## 2021-05-07 MED ORDER — NITROGLYCERIN 0.4 MG SL SUBL: 0.4000 mg | SUBLINGUAL_TABLET | SUBLINGUAL | Status: DC | PRN

## 2021-05-07 MED ORDER — LISINOPRIL 5 MG PO TABS: 5.0000 mg | ORAL_TABLET | Freq: Every day | ORAL | Status: DC

## 2021-05-07 MED ORDER — ACETAMINOPHEN 325 MG PO TABS: 650.0000 mg | ORAL_TABLET | Freq: Four times a day (QID) | ORAL | Status: DC | PRN

## 2021-05-07 MED ORDER — MECLIZINE HCL 25 MG PO TABS: 50.0000 mg | ORAL_TABLET | Freq: Two times a day (BID) | ORAL | Status: DC | PRN

## 2021-05-07 MED ORDER — POLYETHYLENE GLYCOL 3350 17 G PO PACK: 17.0000 g | PACK | Freq: Every day | ORAL | Status: DC

## 2021-05-07 MED ORDER — IOHEXOL 300 MG/ML  SOLN
100.0000 mL | Freq: Once | INTRAMUSCULAR | Status: AC | PRN
  Administered 2021-05-07: 19:00:00 100 mL via INTRAVENOUS

## 2021-05-07 MED ORDER — SODIUM CHLORIDE 0.9 % IV SOLN: INTRAVENOUS | Status: DC

## 2021-05-07 MED ORDER — ISOSORBIDE MONONITRATE ER 60 MG PO TB24: 30.0000 mg | ORAL_TABLET | Freq: Every day | ORAL | Status: DC

## 2021-05-07 MED ORDER — METOPROLOL SUCCINATE ER 25 MG PO TB24
12.5000 mg | ORAL_TABLET | Freq: Every day | ORAL | Status: DC
  Filled 2021-05-07: qty 0.5

## 2021-05-07 MED ORDER — EZETIMIBE 10 MG PO TABS
10.0000 mg | ORAL_TABLET | Freq: Every day | ORAL | Status: DC
  Filled 2021-05-07: qty 1

## 2021-05-07 MED ORDER — ADULT MULTIVITAMIN W/MINERALS CH: 1.0000 | ORAL_TABLET | Freq: Every day | ORAL | Status: DC

## 2021-05-07 MED ORDER — PHENYTOIN SODIUM EXTENDED 100 MG PO CAPS: 300.0000 mg | ORAL_CAPSULE | Freq: Every day | ORAL | Status: DC

## 2021-05-07 MED ORDER — ONDANSETRON HCL 4 MG/2ML IJ SOLN: 4.0000 mg | Freq: Four times a day (QID) | INTRAMUSCULAR | Status: DC | PRN

## 2021-05-07 MED ORDER — PHENOBARBITAL 32.4 MG PO TABS
129.6000 mg | ORAL_TABLET | Freq: Every day | ORAL | Status: DC
  Filled 2021-05-07: qty 4

## 2021-05-07 MED ORDER — MAGNESIUM HYDROXIDE 400 MG/5ML PO SUSP: 30.0000 mL | Freq: Every day | ORAL | Status: DC | PRN

## 2021-05-07 NOTE — ED Provider Notes (Signed)
Emergency Medicine Provider Triage Evaluation Note  Edward Thompson , a 70 y.o. male  was evaluated in triage.  Pt complains of 4 days of right lower quadrant abdominal pain.  No bowel movement in the last 4 to 5 days.  Has a history of bowel obstruction.  Initially symptoms began for 5 days ago with vomiting.  Has been having increasing right lower quadrant abdominal pain.  No recent nausea or vomiting.  Denies any fevers chills or urinary symptoms..  Review of Systems  Positive: Abdominal pain, nausea, vomiting Negative: Fever, chest pain, shortness of breath  Physical Exam  Ht 5\' 4"  (1.626 m)    Wt 77.1 kg    BMI 29.18 kg/m  Gen:   Awake, no distress presents in a wheelchair Resp:  Normal effort  MSK:   Moves extremities without difficulty  Other:  Abdomen with focal tenderness to the right lower quadrant with significant distention  Medical Decision Making  Medically screening exam initiated at 6:01 PM.  Appropriate orders placed.  Edward Thompson was informed that the remainder of the evaluation will be completed by another provider, this initial triage assessment does not replace that evaluation, and the importance of remaining in the ED until their evaluation is complete.  Insert peripheral IV, will obtain blood work and imaging.   [name], PA-C 05/07/21 1804    05/09/21, MD 05/07/21 [phone]

## 2021-05-07 NOTE — ED Triage Notes (Signed)
Pt states that he has been having R sided abd pain starting 4 days ago- pt has a hx of bowel obstructions- pt has had some nausea with vomiting on Sunday and Monday but none since

## 2021-05-07 NOTE — ED Notes (Signed)
Pt. Self-caths at home, did not bring supplies to obtain urine sample. Per Dr. Vicente Males instruction, this RN in and out cathed pt. Without issue. dark orange urine drained.
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
## 2021-05-08 ENCOUNTER — Inpatient Hospital Stay: Payer: Medicare HMO

## 2021-05-08 ENCOUNTER — Encounter: Payer: Self-pay | Admitting: Internal Medicine

## 2021-05-08 DIAGNOSIS — K56609 Unspecified intestinal obstruction, unspecified as to partial versus complete obstruction: Secondary | ICD-10-CM

## 2021-05-08 LAB — RESP PANEL BY RT-PCR (FLU A&B, COVID) ARPGX2
Influenza A by PCR: NEGATIVE
Influenza B by PCR: NEGATIVE
SARS Coronavirus 2 by RT PCR: NEGATIVE

## 2021-05-08 LAB — GLUCOSE, CAPILLARY: Glucose-Capillary: 75 mg/dL (ref 70–99)

## 2021-05-08 LAB — BASIC METABOLIC PANEL
Anion gap: 13 (ref 5–15)
BUN: 22 mg/dL (ref 8–23)
CO2: 19 mmol/L — ABNORMAL LOW (ref 22–32)
Calcium: 8.5 mg/dL — ABNORMAL LOW (ref 8.9–10.3)
Chloride: 101 mmol/L (ref 98–111)
Creatinine, Ser: 0.56 mg/dL — ABNORMAL LOW (ref 0.61–1.24)
GFR, Estimated: >60 mL/min (ref >60)
Glucose, Bld: 93 mg/dL (ref 70–99)
Potassium: 3.1 mmol/L — ABNORMAL LOW (ref 3.5–5.1)
Sodium: 133 mmol/L — ABNORMAL LOW (ref 135–145)

## 2021-05-08 LAB — CBC
HCT: 35 % — ABNORMAL LOW (ref 39.0–52.0)
Hemoglobin: 11.9 g/dL — ABNORMAL LOW (ref 13.0–17.0)
MCH: 31.4 pg (ref 26.0–34.0)
MCHC: 34 g/dL (ref 30.0–36.0)
MCV: 92.3 fL (ref 80.0–100.0)
Platelets: 186 10*3/uL (ref 150–400)
RBC: 3.79 MIL/uL — ABNORMAL LOW (ref 4.22–5.81)
RDW: 13.7 % (ref 11.5–15.5)
WBC: 6.3 10*3/uL (ref 4.0–10.5)
nRBC: 0 % (ref 0.0–0.2)

## 2021-05-08 LAB — TROPONIN I (HIGH SENSITIVITY): Troponin I (High Sensitivity): 12 ng/L (ref <18)

## 2021-05-08 LAB — LACTIC ACID, PLASMA: Lactic Acid, Venous: 1 mmol/L (ref 0.5–1.9)

## 2021-05-08 MED ORDER — SODIUM CHLORIDE 0.9 % IV SOLN
1.0000 g | INTRAVENOUS | Status: DC
  Administered 2021-05-08 – 2021-05-09 (×2): 1 g via INTRAVENOUS
  Filled 2021-05-08: qty 1
  Filled 2021-05-08 (×2): qty 10

## 2021-05-08 MED ORDER — LABETALOL HCL 5 MG/ML IV SOLN: 10.0000 mg | INTRAVENOUS | Status: DC | PRN

## 2021-05-08 MED ORDER — PHENYTOIN SODIUM 50 MG/ML IJ SOLN
100.0000 mg | Freq: Three times a day (TID) | INTRAMUSCULAR | Status: DC
  Administered 2021-05-08 – 2021-05-09 (×4): 100 mg via INTRAVENOUS
  Filled 2021-05-08 (×7): qty 2

## 2021-05-08 MED ORDER — POTASSIUM CHLORIDE 10 MEQ/100ML IV SOLN
10.0000 meq | INTRAVENOUS | Status: AC
  Administered 2021-05-08 (×3): 10 meq via INTRAVENOUS
  Filled 2021-05-08 (×4): qty 100

## 2021-05-08 MED ORDER — PHENOBARBITAL SODIUM 130 MG/ML IJ SOLN
130.0000 mg | INTRAMUSCULAR | Status: DC
  Administered 2021-05-08 – 2021-05-09 (×2): 130 mg via INTRAVENOUS
  Filled 2021-05-08 (×2): qty 1

## 2021-05-08 NOTE — ED Notes (Signed)
Informed RN bed assigned 

## 2021-05-08 NOTE — ED Notes (Signed)
NG tube pulled 2inches and taped to pt's chest. Flushed NG tube as well. Pt resting comfortably

## 2021-05-08 NOTE — Consult Note (Signed)
Patient ID: Edward Thompson, male   DOB: 19-Apr-1951, 70 y.o.   MRN: [id number]  HPI Edward Thompson is a 70 y.o. male seen in consultation at the request of Dr. Normand Sloop for small bowel obstruction.  He does have a longstanding history of seizure disorder, coronary artery disease, diabetes and foot drop.  Baseline he moves most of the time in a wheelchair but is able to walk with a walker.  He lives with family.  He is able to do most of his ADLs. No previous major abdominal operations.  He is competent and is able to give a full history. He reports having  abdominal pain for the last 3 to 4 days.  Pain is intermittent colicky type, moderate to severe.  No specific alleviating or aggravating factors.  He also has associated nausea and vomiting.  Last bowel movement was last night.  He endorses passing flatus.  He did have a similar episode of SBO few months ago ( August 2022) and was seen by Dr. Windell Moment and was managed conservatively. Pt this am denies any abdominal pain. HE is on Plavix And ASA. He self cath.   HPI  Past Medical History:  Diagnosis Date   CAD (coronary artery disease)    Carotid stenosis, left 08/2017   Diabetes mellitus without complication (HCC)    Foot drop    Fracture of neck ([place]) 2008   fell off a roof. required halo x 4 months. also fractured alot of vertebrae   Heart attack ([place]) 2015   Heart disease    Hepatitis    6th grade    Hyperlipidemia    Hypertension    Myocardial infarction acute ([place]) 2015   Seizures ([place])    taking phenobarbitol and dilantin. LAST SEIZURE WAS 2016. well controlled on meds   Sleep apnea    USES CPAP   Syncope 2019   Vertigo    Viral meningitis     Past Surgical History:  Procedure Laterality Date   CARDIAC SURGERY     COLONOSCOPY     COLONOSCOPY WITH PROPOFOL N/A 03/11/2021   Procedure: COLONOSCOPY WITH PROPOFOL;  Surgeon: Toledo, Benay Pike, MD;  Location: [facility] ENDOSCOPY;  Service: Gastroenterology;  Laterality: N/A;    CORONARY ANGIOPLASTY     PATIENT UNAWARE OF THIS   CORONARY ARTERY BYPASS GRAFT  2015   ENDARTERECTOMY Left 08/19/2017   Procedure: ENDARTERECTOMY CAROTID;  Surgeon: Katha Cabal, MD;  Location: [facility] ORS;  Service: Vascular;  Laterality: Left;   ESOPHAGOGASTRODUODENOSCOPY N/A 03/11/2021   Procedure: ESOPHAGOGASTRODUODENOSCOPY (EGD);  Surgeon: Toledo, Benay Pike, MD;  Location: [facility] ENDOSCOPY;  Service: Gastroenterology;  Laterality: N/A;  DM   HERNIA REPAIR Right 1985   inguinal    Family History  Problem Relation Age of Onset   Heart disease Mother    Heart attack Father     Social History Social History   Tobacco Use   Smoking status: Never   Smokeless tobacco: Never  Vaping Use   Vaping Use: Never used  Substance Use Topics   Alcohol use: No   Drug use: No    No Known Allergies  Current Facility-Administered Medications  Medication Dose Route Frequency Provider Last Rate Last Admin   0.9 %  sodium chloride infusion   Intravenous Continuous Mansy, Arvella Merles, MD   Stopped at 05/08/21 0813   acetaminophen (TYLENOL) tablet 650 mg  650 mg Oral Q6H PRN Mansy, Arvella Merles, MD       Or  acetaminophen (TYLENOL) suppository 650 mg  650 mg Rectal Q6H PRN Mansy, Jan A, MD       cefTRIAXone (ROCEPHIN) 1 g in sodium chloride 0.9 % 100 mL IVPB  1 g Intravenous Q24H Rise Patience, MD       labetalol (NORMODYNE) injection 10 mg  10 mg Intravenous Q2H PRN Rise Patience, MD       nitroGLYCERIN (NITROSTAT) SL tablet 0.4 mg  0.4 mg Sublingual Q5 min PRN Mansy, Jan A, MD       ondansetron [facility]) tablet 4 mg  4 mg Oral Q6H PRN Mansy, Jan A, MD       Or   ondansetron [facility]) injection 4 mg  4 mg Intravenous Q6H PRN Mansy, Jan A, MD       PHENObarbital (LUMINAL) injection 130 mg  130 mg Intravenous Q24H Rise Patience, MD   130 mg at 05/08/21 6734   phenytoin (DILANTIN) injection 100 mg  100 mg Intravenous Q8H Rise Patience, MD   100 mg at 05/08/21 1937   Current  Outpatient Medications  Medication Sig Dispense Refill   ACCU-CHEK GUIDE test strip      Accu-Chek Softclix Lancets lancets      acetaminophen (TYLENOL) 500 MG tablet Take 500 mg by mouth every 6 (six) hours as needed.     aspirin EC 81 MG tablet Take 325 mg by mouth at bedtime.     Blood Glucose Monitoring Suppl (ACCU-CHEK GUIDE) w/Device KIT      clopidogrel (PLAVIX) 75 MG tablet Take 75 mg by mouth at bedtime.      ezetimibe (ZETIA) 10 MG tablet Take 10 mg by mouth daily.      feeding supplement, GLUCERNA SHAKE, (GLUCERNA SHAKE) LIQD Take 237 mLs by mouth 2 (two) times daily between meals. 1000 mL 12   isosorbide mononitrate (IMDUR) 30 MG 24 hr tablet Take 30 mg by mouth at bedtime.      lisinopril (ZESTRIL) 5 MG tablet Take 5 mg by mouth daily.     meclizine (ANTIVERT) 25 MG tablet Take 50 mg by mouth 2 (two) times daily as needed for dizziness.      metFORMIN (GLUCOPHAGE) 500 MG tablet Take 500 mg by mouth daily.      metoprolol succinate (TOPROL-XL) 25 MG 24 hr tablet Take 0.5 tablets (12.5 mg total) by mouth daily. 30 tablet 0   Multiple Vitamin (MULTIVITAMIN) tablet Take 1 tablet by mouth daily.     pantoprazole (PROTONIX) 40 MG tablet Take 40 mg by mouth daily.     PHENobarbital (LUMINAL) 64.8 MG tablet Take 2 tablets (129.6 mg total) by mouth at bedtime. 60 tablet 1   phenytoin (DILANTIN) 100 MG ER capsule Take 300 mg by mouth at bedtime.      polyethylene glycol (MIRALAX / GLYCOLAX) 17 g packet Take 17 g by mouth daily. 14 each 0   rosuvastatin (CRESTOR) 40 MG tablet Take 40 mg by mouth at bedtime.      tamsulosin (FLOMAX) 0.4 MG CAPS capsule Take 1 capsule (0.4 mg total) by mouth daily. 30 capsule 0   nitroGLYCERIN (NITROSTAT) 0.4 MG SL tablet Place 0.4 mg under the tongue every 5 (five) minutes as needed for chest pain.        Review of Systems Full ROS  was asked and was negative except for the information on the HPI  Physical Exam Blood pressure 111/60, pulse 94,  temperature 98.9 F (37.2 C), temperature source Oral, resp.  rate 15, height 5' 4" (1.626 m), weight 77.1 kg, SpO2 96 %. CONSTITUTIONAL: NAD. Chronically ill. EYES: Pupils are equal, round, Sclera are non-icteric. EARS, NOSE, MOUTH AND THROAT:He is wearing a mask, The oral mucosa is pink and moist. Hearing is intact to voice. LYMPH NODES:  Lymph nodes in the neck are normal. RESPIRATORY:  Lungs are clear. There is normal respiratory effort, with equal breath sounds bilaterally, and without pathologic use of accessory muscles. CARDIOVASCULAR: Heart is regular without murmurs, gallops, or rubs. GI: The abdomen is  soft, distended , no peritonitis or rebound nontender, and nondistended. There are no palpable masses. There is no hepatosplenomegaly. There are decreased bowel sounds  GU: Rectal deferred.   MUSCULOSKELETAL: pedal edema to the mid calf. SKIN: Turgor is good and there are no pathologic skin lesions or ulcers. NEUROLOGIC: Motor and sensation is grossly normal. Cranial nerves are grossly intact. PSYCH:  Oriented to person, place and time. Affect is normal.  Data Reviewed  I have personally reviewed the patient's imaging, laboratory findings and medical records.    Assessment/Plan 70 year old male with small bowel obstruction.  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  No evidence of sepsis no evidence of peritonitis.  It is intriguing that he has a recurrence with a virgin abdomen.  He does have a significant chronic neurological disorder.  I am not necessarily convinced that he has a true mechanical obstruction.  We will try NG tube n.p.o. and hydration.  We will do serial abdominal exams.  He understands that if he were to deteriorate or he were not to improve he may need exploratory laparotomy. We will continue to follow.     Caroleen Hamman, MD FACS General Surgeon 05/08/2021, 11:31 AM

## 2021-05-08 NOTE — ED Notes (Signed)
Called lab to add on urine culture ?

## 2021-05-08 NOTE — H&P (Addendum)
History and Physical    Edward Thompson KZS:[id number] DOB: [birth year] DOA: 05/07/2021  PCP: Jodi Marble, MD  Patient coming from: Home.  Chief Complaint: Abdominal pain nausea vomiting.  HPI: Edward Thompson is a 70 y.o. male with history of CAD status post stenting, seizures, hypertension, sleep apnea presents to the ER with complaint of abdominal pain with nausea vomiting.  Patient has been having abdominal pain mostly in the right lower quadrant for the last 4 to 5 days has not moved his bowels for almost 1 week.  Patient subsequently started having nausea and vomiting.  Denies any chest pain or shortness of breath.  ED Course: In the ER CT scan shows features concerning for small bowel obstruction with transition point in the ileal area.  On-call general surgeon was consulted.  Labs show hyponatremia 129 total bilirubin 1.3 lactic acid 1.1 hemoglobin 12.9 COVID test pending.  Patient was placed on NG tube admitted for further management.  Review of Systems: As per HPI, rest all negative.   Past Medical History:  Diagnosis Date   CAD (coronary artery disease)    Carotid stenosis, left 08/2017   Diabetes mellitus without complication (HCC)    Foot drop    Fracture of neck ([place]) 2008   fell off a roof. required halo x 4 months. also fractured alot of vertebrae   Heart attack ([place]) 2015   Heart disease    Hepatitis    6th grade    Hyperlipidemia    Hypertension    Myocardial infarction acute ([place]) 2015   Seizures ([place])    taking phenobarbitol and dilantin. LAST SEIZURE WAS 2016. well controlled on meds   Sleep apnea    USES CPAP   Syncope 2019   Vertigo    Viral meningitis     Past Surgical History:  Procedure Laterality Date   CARDIAC SURGERY     COLONOSCOPY     COLONOSCOPY WITH PROPOFOL N/A 03/11/2021   Procedure: COLONOSCOPY WITH PROPOFOL;  Surgeon: Toledo, Benay Pike, MD;  Location: [facility] ENDOSCOPY;  Service: Gastroenterology;  Laterality: N/A;   CORONARY  ANGIOPLASTY     PATIENT UNAWARE OF THIS   CORONARY ARTERY BYPASS GRAFT  2015   ENDARTERECTOMY Left 08/19/2017   Procedure: ENDARTERECTOMY CAROTID;  Surgeon: Katha Cabal, MD;  Location: [facility] ORS;  Service: Vascular;  Laterality: Left;   ESOPHAGOGASTRODUODENOSCOPY N/A 03/11/2021   Procedure: ESOPHAGOGASTRODUODENOSCOPY (EGD);  Surgeon: Toledo, Benay Pike, MD;  Location: [facility] ENDOSCOPY;  Service: Gastroenterology;  Laterality: N/A;  DM   HERNIA REPAIR Right 1985   inguinal     reports that he has never smoked. He has never used smokeless tobacco. He reports that he does not drink alcohol and does not use drugs.  No Known Allergies  Family History  Problem Relation Age of Onset   Heart disease Mother    Heart attack Father     Prior to Admission medications   Medication Sig Start Date End Date Taking? Authorizing Provider  ACCU-CHEK GUIDE test strip  04/24/21  Yes [provider]  Accu-Chek Softclix Lancets lancets  04/24/21  Yes [provider]  acetaminophen (TYLENOL) 500 MG tablet Take 500 mg by mouth every 6 (six) hours as needed.   Yes [provider]  aspirin EC 81 MG tablet Take 325 mg by mouth at bedtime.   Yes [provider]  Blood Glucose Monitoring Suppl (ACCU-CHEK GUIDE) w/Device KIT  04/24/21  Yes [provider]  clopidogrel (  PLAVIX) 75 MG tablet Take 75 mg by mouth at bedtime.  01/28/15  Yes [provider]  ezetimibe (ZETIA) 10 MG tablet Take 10 mg by mouth daily.  11/27/18  Yes [provider]  feeding supplement, GLUCERNA SHAKE, (GLUCERNA SHAKE) LIQD Take 237 mLs by mouth 2 (two) times daily between meals. 12/13/20  Yes Lorella Nimrod, MD  isosorbide mononitrate (IMDUR) 30 MG 24 hr tablet Take 30 mg by mouth at bedtime.  04/29/17  Yes [provider]  lisinopril (ZESTRIL) 5 MG tablet Take 5 mg by mouth daily. 03/09/21  Yes [provider]  meclizine (ANTIVERT) 25 MG tablet Take 50 mg by  mouth 2 (two) times daily as needed for dizziness.  05/27/14  Yes [provider]  metFORMIN (GLUCOPHAGE) 500 MG tablet Take 500 mg by mouth daily.  04/28/17  Yes [provider]  metoprolol succinate (TOPROL-XL) 25 MG 24 hr tablet Take 0.5 tablets (12.5 mg total) by mouth daily. 12/13/20  Yes Lorella Nimrod, MD  Multiple Vitamin (MULTIVITAMIN) tablet Take 1 tablet by mouth daily.   Yes [provider]  pantoprazole (PROTONIX) 40 MG tablet Take 40 mg by mouth daily. 03/31/21  Yes [provider]  PHENobarbital (LUMINAL) 64.8 MG tablet Take 2 tablets (129.6 mg total) by mouth at bedtime. 08/25/17  Yes Latanya Maudlin, NP  phenytoin (DILANTIN) 100 MG ER capsule Take 300 mg by mouth at bedtime.    Yes [provider]  polyethylene glycol (MIRALAX / GLYCOLAX) 17 g packet Take 17 g by mouth daily. 10/11/20  Yes Annita Brod, MD  rosuvastatin (CRESTOR) 40 MG tablet Take 40 mg by mouth at bedtime.    Yes [provider]  tamsulosin (FLOMAX) 0.4 MG CAPS capsule Take 1 capsule (0.4 mg total) by mouth daily. 10/11/20  Yes Annita Brod, MD  nitroGLYCERIN (NITROSTAT) 0.4 MG SL tablet Place 0.4 mg under the tongue every 5 (five) minutes as needed for chest pain.  08/22/13   [provider]    Physical Exam: Constitutional: Moderately built and nourished. Vitals:   05/07/21 2115 05/07/21 2130 05/07/21 2145 05/08/21 0212  BP:  (!) 143/78  133/72  Pulse: (!) 104 (!) 106 (!) 109 99  Resp:    20  Temp:      TempSrc:      SpO2: 99% 99% 99% 99%  Weight:      Height:       Eyes: Anicteric no pallor. ENMT: No discharge from the ears eyes nose mouth. Neck: No mass felt.  No neck rigidity. Respiratory: No rhonchi or crepitations. Cardiovascular: S1-S2 heard. Abdomen: Distended nontender bowel sound febrile. Musculoskeletal: No edema. Skin: No rash. Neurologic: Alert awake oriented to time place and person.  Moves all  extremities. Psychiatric: Appears normal.  Normal affect.   Labs on Admission: I have personally reviewed following labs and imaging studies  CBC: Recent Labs  Lab 05/07/21 1810  WBC 7.3  HGB 12.9*  HCT 38.2*  MCV 91.6  PLT 481   Basic Metabolic Panel: Recent Labs  Lab 05/07/21 1810  NA 129*  K 3.9  CL 94*  CO2 25  GLUCOSE 134*  BUN 36*  CREATININE 0.65  CALCIUM 9.2   GFR: Estimated Creatinine Clearance: 79.5 mL/min (by C-G formula based on SCr of 0.65 mg/dL). Liver Function Tests: Recent Labs  Lab 05/07/21 1810  AST 21  ALT 21  ALKPHOS 111  BILITOT 1.3*  PROT 7.6  ALBUMIN 3.8  No results for input(s): LIPASE, AMYLASE in the last 168 hours. No results for input(s): AMMONIA in the last 168 hours. Coagulation Profile: No results for input(s): INR, PROTIME in the last 168 hours. Cardiac Enzymes: No results for input(s): CKTOTAL, CKMB, CKMBINDEX, TROPONINI in the last 168 hours. BNP (last 3 results) No results for input(s): PROBNP in the last 8760 hours. HbA1C: No results for input(s): HGBA1C in the last 72 hours. CBG: No results for input(s): GLUCAP in the last 168 hours. Lipid Profile: No results for input(s): CHOL, HDL, LDLCALC, TRIG, CHOLHDL, LDLDIRECT in the last 72 hours. Thyroid Function Tests: No results for input(s): TSH, T4TOTAL, FREET4, T3FREE, THYROIDAB in the last 72 hours. Anemia Panel: No results for input(s): VITAMINB12, FOLATE, FERRITIN, TIBC, IRON, RETICCTPCT in the last 72 hours. Urine analysis:    Component Value Date/Time   COLORURINE YELLOW (A) 05/07/2021 2111   APPEARANCEUR HAZY (A) 05/07/2021 2111   LABSPEC 1.040 (H) 05/07/2021 2111   PHURINE 5.0 05/07/2021 2111   GLUCOSEU NEGATIVE 05/07/2021 2111   HGBUR SMALL (A) 05/07/2021 2111   [place] NEGATIVE 05/07/2021 2111   KETONESUR 20 (A) 05/07/2021 2111   PROTEINUR 30 (A) 05/07/2021 2111   NITRITE POSITIVE (A) 05/07/2021 2111   LEUKOCYTESUR TRACE (A) 05/07/2021 2111    Sepsis Labs: @LABRCNTIP (procalcitonin:4,lacticidven:4) )No results found for this or any previous visit (from the past 240 hour(s)).   Radiological Exams on Admission: CT ABDOMEN PELVIS W CONTRAST  Result Date: 05/07/2021 CLINICAL DATA:  Right-sided abdominal pain for 4 days, nausea and vomiting EXAM: CT ABDOMEN AND PELVIS WITH CONTRAST TECHNIQUE: Multidetector CT imaging of the abdomen and pelvis was performed using the standard protocol following bolus administration of intravenous contrast. CONTRAST:  181m OMNIPAQUE IOHEXOL 300 MG/ML  SOLN COMPARISON:  12/10/2020 FINDINGS: Lower chest: Scattered hypoventilatory changes at the lung bases. No acute pleural or parenchymal lung disease. Hepatobiliary: No focal liver abnormality is seen. No gallstones, gallbladder wall thickening, or biliary dilatation. Pancreas: Unremarkable. No pancreatic ductal dilatation or surrounding inflammatory changes. Spleen: Normal in size without focal abnormality. Adrenals/Urinary Tract: Stable left renal cyst. The kidneys otherwise enhance normally and symmetrically. The adrenals are stable. Bladder is unremarkable. Stomach/Bowel: There is diffuse small bowel dilation measuring up to 5.7 cm in diameter, with numerous gas fluid levels. Gradual transition within the ileum to normal caliber small bowel. Moderate gas and stool throughout the colon to the level of the rectum. No inflammatory changes. Vascular/Lymphatic: Aortic atherosclerosis. No enlarged abdominal or pelvic lymph nodes. Reproductive: Prostate is unremarkable. Other: No free fluid or free gas.  No abdominal wall hernia. Musculoskeletal: Compression deformity within the superior endplate of the L4 vertebral body has developed in the interim since prior exam, with prominent sclerosis along the fracture line suggesting subacute fracture. No significant retropulsion. No other acute bony abnormalities. Reconstructed images demonstrate no additional findings.  IMPRESSION: 1. Small-bowel obstruction, with diffuse jejunal dilation and gradual transition point within the terminal ileum. Gas and stool throughout the colon suggest an early or intermittent obstruction. Continued radiographic follow-up is recommended. 2. Age indeterminate L4 compression deformity, new since 12/10/2020. Sclerosis along the fracture line suggests subacute fracture. 3.  Aortic Atherosclerosis (ICD10-I70.0). Electronically Signed   By: MRanda NgoM.D.   On: 05/07/2021 19:29   DG Abd Portable 1 View  Result Date: 05/08/2021 CLINICAL DATA:  NG placement. EXAM: PORTABLE ABDOMEN - 1 VIEW COMPARISON:  CT dated 05/07/2021. FINDINGS: Enteric tube with tip in the left upper abdomen, likely in the gastric  fundus. Multiple air distended loops of small bowel measure up to 4.5 cm. IMPRESSION: Enteric tube with tip in the gastric fundus. Electronically Signed   By: Anner Crete M.D.   On: 05/08/2021 01:07      Assessment/Plan Principal Problem:   SBO (small bowel obstruction) (HCC)    Small bowel obstruction with transition point around the terminal ileum.  Just when I was about examined the patient patient had a bowel movement.  Abdomen still mildly distended.  For now we will keep patient n.p.o. and NG tube suction repeat KUB.  We will follow general surgery consult. History of seizures on phenobarbital and Dilantin.  Discussed with pharmacy changed to IV. History of CAD status post tenting denies any chest pain.  Presently NPO. Hypertension we will keep patient on as needed IV labetalol. Possible UTI on empiric antibiotics.  Follow urine cultures. Anemia -follow CBC. Hyponatremia probably from dehydration.  Presently receiving fluids.  Follow metabolic panel.  COVID test is pending.   Since patient has bowel obstruction will need close monitoring inpatient status.   DVT prophylaxis: SCDs.  Avoiding anticoagulation in anticipation of possible procedure. Code Status: Full  code. Family Communication: Discussed with patient. Disposition Plan: Home. Consults called: General surgery. Admission status: Inpatient.   Rise Patience MD [facility] Pager [phone].  If 7PM-7AM, please contact night-coverage [email or web address] Password [email or web address]  05/08/2021, 5:49 AM

## 2021-05-08 NOTE — Progress Notes (Addendum)
Patient is admitted this am for sbo, details please see HPI, he is on ng suction, currently denies ab pain, no bm, not passing gas, no fever, vital signs are stable Reports lives with family, baseline walker and wheelchair bound, neurogenic bladder does self cath since 12/2020, will place foley here. Lung clear, has bilateral lower extremity edema, reports chronic , taking diuretic, will get venous doppler to rule out DVT Hypokalemia, replace k, check mag General surgery following, will follow recommendation

## 2021-05-09 ENCOUNTER — Encounter: Payer: Self-pay | Admitting: Internal Medicine

## 2021-05-09 ENCOUNTER — Inpatient Hospital Stay: Payer: Medicare HMO

## 2021-05-09 DIAGNOSIS — K56609 Unspecified intestinal obstruction, unspecified as to partial versus complete obstruction: Secondary | ICD-10-CM | POA: Diagnosis not present

## 2021-05-09 LAB — BASIC METABOLIC PANEL
Anion gap: 13 (ref 5–15)
BUN: 14 mg/dL (ref 8–23)
CO2: 16 mmol/L — ABNORMAL LOW (ref 22–32)
Calcium: 8.4 mg/dL — ABNORMAL LOW (ref 8.9–10.3)
Chloride: 107 mmol/L (ref 98–111)
Creatinine, Ser: 0.51 mg/dL — ABNORMAL LOW (ref 0.61–1.24)
GFR, Estimated: >60 mL/min (ref >60)
Glucose, Bld: 66 mg/dL — ABNORMAL LOW (ref 70–99)
Potassium: 3.2 mmol/L — ABNORMAL LOW (ref 3.5–5.1)
Sodium: 136 mmol/L (ref 135–145)

## 2021-05-09 LAB — MAGNESIUM: Magnesium: 1.8 mg/dL (ref 1.7–2.4)

## 2021-05-09 MED ORDER — PHENYTOIN 50 MG PO CHEW
300.0000 mg | CHEWABLE_TABLET | Freq: Every day | ORAL | Status: DC
  Administered 2021-05-09 – 2021-05-10 (×2): 300 mg via ORAL
  Filled 2021-05-09 (×3): qty 6

## 2021-05-09 MED ORDER — POTASSIUM CHLORIDE 2 MEQ/ML IV SOLN: INTRAVENOUS | Status: DC

## 2021-05-09 MED ORDER — CHLORHEXIDINE GLUCONATE CLOTH 2 % EX PADS
6.0000 | MEDICATED_PAD | Freq: Every day | CUTANEOUS | Status: DC
  Administered 2021-05-09 – 2021-05-10 (×2): 6 via TOPICAL

## 2021-05-09 MED ORDER — POTASSIUM CHLORIDE 2 MEQ/ML IV SOLN
INTRAVENOUS | Status: DC
  Filled 2021-05-09: qty 1000

## 2021-05-09 MED ORDER — MAGNESIUM SULFATE 2 GM/50ML IV SOLN
2.0000 g | Freq: Once | INTRAVENOUS | Status: AC
  Administered 2021-05-09: 2 g via INTRAVENOUS
  Filled 2021-05-09: qty 50

## 2021-05-09 MED ORDER — PHENOBARBITAL 64.8 MG PO TABS
129.6000 mg | ORAL_TABLET | Freq: Every day | ORAL | Status: DC
  Filled 2021-05-09 (×2): qty 2

## 2021-05-09 MED ORDER — POTASSIUM CHLORIDE CRYS ER 20 MEQ PO TBCR
40.0000 meq | EXTENDED_RELEASE_TABLET | ORAL | Status: AC
  Administered 2021-05-09 (×2): 40 meq via ORAL
  Filled 2021-05-09 (×2): qty 2

## 2021-05-09 MED ORDER — PHENOBARBITAL 32.4 MG PO TABS
129.6000 mg | ORAL_TABLET | Freq: Every day | ORAL | Status: DC
  Administered 2021-05-09 – 2021-05-10 (×2): 129.6 mg via ORAL
  Filled 2021-05-09 (×2): qty 4

## 2021-05-09 NOTE — Progress Notes (Addendum)
Progress Note    Edward Thompson  KGY:[id number] DOB: 1950-11-21  DOA: 05/07/2021 PCP: Sherron Monday, MD      Brief Narrative:    Medical records reviewed and are as summarized below:  Edward Thompson is a 70 y.o. male with medical history significant for CAD s/p coronary stent, seizure disorder, hypertension, sleep apnea, who presented to the hospital because of nausea, abdominal pain and vomiting.  He was found to have small bowel obstruction.  He was managed conservatively with gastric decompression with NG tube, IV fluids, analgesics and antiemetics.      Assessment/Plan:   Principal Problem:   SBO (small bowel obstruction) (HCC)   Body mass index is 29.18 kg/m.  Small bowel obstruction: Improved.  Start clear liquid diet per surgeon.  Atelectasis/pneumonia on CT chest: Abdominal x-ray was concerning for pneumothorax but no evidence of pneumothorax CT chest.  Patient has no symptoms to suggest pneumonia.  No fever or leukocytosis.  He is already on IV Rocephin and no need to adjust antibiotics at this time.  Incentive spirometry as needed.  Seizure disorder: Change IV Depakote and IV Dilantin to oral Depakote and oral Dilantin.    Hypokalemia: Replete potassium and monitor levels.  Magnesium is 1.8 (low normal).  Replete with IV mag sulfate because of low potassium.  Suspected UTI: Urine culture is growing E. coli.  Continue IV Rocephin and follow-up culture sensitivity report.  Type II DM, hypoglycemia: Start clear liquid diet.  Monitor glucose levels closely and treat hypoglycemia as needed.  Other comorbidities include CAD s/p coronary stent, hypertension, hyperlipidemia  Diet Order             Diet clear liquid Room service appropriate? Yes; Fluid consistency: Thin  Diet effective now                      Consultants: General surgeon  Procedures: None    Medications:    Chlorhexidine Gluconate Cloth  6 each Topical Daily    PHENObarbital  130 mg Intravenous Q24H   phenytoin (DILANTIN) IV  100 mg Intravenous Q8H   Continuous Infusions:  cefTRIAXone (ROCEPHIN)  IV Stopped (05/09/21 0018)     Anti-infectives (From admission, onward)    Start     Dose/Rate Route Frequency Ordered Stop   05/08/21 2200  cefTRIAXone (ROCEPHIN) 1 g in sodium chloride 0.9 % 100 mL IVPB        1 g 200 mL/hr over 30 Minutes Intravenous Every 24 hours 05/08/21 0558     05/07/21 2200  cefTRIAXone (ROCEPHIN) 1 g in sodium chloride 0.9 % 100 mL IVPB        1 g 200 mL/hr over 30 Minutes Intravenous  Once 05/07/21 2157 05/08/21 0240              Family Communication/Anticipated D/C date and plan/Code Status   DVT prophylaxis: Place and maintain sequential compression device Start: 05/08/21 0557     Code Status: Full Code  Family Communication: None Disposition Plan: Possible discharge to home in 1 to 2 days   Status is: Inpatient  Remains inpatient appropriate because: Small bowel obstruction on clear liquid diet           Subjective:   Interval events noted.  No abdominal pain, vomiting, chest pain, cough or shortness of breath  Objective:    Vitals:   05/08/21 1935 05/09/21 0459 05/09/21 0824 05/09/21 0837  BP: (!) 112/50 (!) 128/56  124/61 (!) 149/79  Pulse: 93 92 81 (!) 104  Resp: 20 20 17 17   Temp: 98.3 F (36.8 C) 98.3 F (36.8 C) 98.2 F (36.8 C) 98.2 F (36.8 C)  TempSrc:  Oral    SpO2: 100% 94% 97% 90%  Weight:      Height:       No data found.   Intake/Output Summary (Last 24 hours) at 05/09/2021 1401 Last data filed at 05/09/2021 1337 Gross per 24 hour  Intake 1815.2 ml  Output 1200 ml  Net 615.2 ml   Filed Weights   05/07/21 1758  Weight: 77.1 kg    Exam:  GEN: NAD SKIN: No rash EYES: EOMI ENT: MMM CV: RRR PULM: CTA B ABD: soft, ND, NT, +BS CNS: AAO x 3, bilateral foot drop EXT: No edema or tenderness        Data Reviewed:   I have personally reviewed  following labs and imaging studies:  Labs: Labs show the following:   Basic Metabolic Panel: Recent Labs  Lab 05/07/21 1810 05/08/21 0822 05/09/21 0504  NA 129* 133* 136  K 3.9 3.1* 3.2*  CL 94* 101 107  CO2 25 19* 16*  GLUCOSE 134* 93 66*  BUN 36* 22 14  CREATININE 0.65 0.56* 0.51*  CALCIUM 9.2 8.5* 8.4*  MG  --   --  1.8   GFR Estimated Creatinine Clearance: 79.5 mL/min (A) (by C-G formula based on SCr of 0.51 mg/dL (L)). Liver Function Tests: Recent Labs  Lab 05/07/21 1810  AST 21  ALT 21  ALKPHOS 111  BILITOT 1.3*  PROT 7.6  ALBUMIN 3.8   No results for input(s): LIPASE, AMYLASE in the last 168 hours. No results for input(s): AMMONIA in the last 168 hours. Coagulation profile No results for input(s): INR, PROTIME in the last 168 hours.  CBC: Recent Labs  Lab 05/07/21 1810 05/08/21 0822  WBC 7.3 6.3  HGB 12.9* 11.9*  HCT 38.2* 35.0*  MCV 91.6 92.3  PLT 237 186   Cardiac Enzymes: No results for input(s): CKTOTAL, CKMB, CKMBINDEX, TROPONINI in the last 168 hours. BNP (last 3 results) No results for input(s): PROBNP in the last 8760 hours. CBG: Recent Labs  Lab 05/08/21 1651  GLUCAP 75   D-Dimer: No results for input(s): DDIMER in the last 72 hours. Hgb A1c: No results for input(s): HGBA1C in the last 72 hours. Lipid Profile: No results for input(s): CHOL, HDL, LDLCALC, TRIG, CHOLHDL, LDLDIRECT in the last 72 hours. Thyroid function studies: No results for input(s): TSH, T4TOTAL, T3FREE, THYROIDAB in the last 72 hours.  Invalid input(s): FREET3 Anemia work up: No results for input(s): VITAMINB12, FOLATE, FERRITIN, TIBC, IRON, RETICCTPCT in the last 72 hours. Sepsis Labs: Recent Labs  Lab 05/07/21 1810 05/08/21 0159 05/08/21 0822  WBC 7.3  --  6.3  LATICACIDVEN 1.1 1.0  --     Microbiology Recent Results (from the past 240 hour(s))  Urine Culture     Status: Abnormal (Preliminary result)   Collection Time: 05/07/21  9:11 PM    Specimen: Urine, Random  Result Value Ref Range Status   Specimen Description   Final    URINE, RANDOM Performed at [facility], [address] [place], [place]    Special Requests   Final    NONE Performed at [facility], [address] [place], [place]    Culture (A)  Final    70,000 COLONIES/mL ESCHERICHIA COLI SUSCEPTIBILITIES TO FOLLOW  Performed at [facility], [address] [place], [place] [address]    Report Status PENDING  Incomplete  Resp Panel by RT-PCR (Flu A&B, Covid) Nasopharyngeal Swab     Status: None   Collection Time: 05/08/21  6:35 AM   Specimen: Nasopharyngeal Swab; Nasopharyngeal(NP) swabs in vial transport medium  Result Value Ref Range Status   SARS Coronavirus 2 by RT PCR NEGATIVE NEGATIVE Final    Comment: (NOTE) SARS-CoV-2 target nucleic acids are NOT DETECTED.  The SARS-CoV-2 RNA is generally detectable in upper respiratory specimens during the acute phase of infection. The lowest concentration of SARS-CoV-2 viral copies this assay can detect is 138 copies/mL. A negative result does not preclude SARS-Cov-2 infection and should not be used as the sole basis for treatment or other patient management decisions. A negative result may occur with  improper specimen collection/handling, submission of specimen other than nasopharyngeal swab, presence of viral mutation(s) within the areas targeted by this assay, and inadequate number of viral copies(<138 copies/mL). A negative result must be combined with clinical observations, patient history, and epidemiological information. The expected result is Negative.  Fact Sheet for Patients:  BloggerCourse.com  Fact Sheet for Healthcare Providers:  SeriousBroker.it  This test is no t yet approved or cleared by the [place] FDA and  has been authorized for detection and/or diagnosis of SARS-CoV-2 by FDA  under an Emergency Use Authorization (EUA). This EUA will remain  in effect (meaning this test can be used) for the duration of the COVID-19 declaration under Section 564(b)(1) of the Act, 21 U.S.C.section 360bbb-3(b)(1), unless the authorization is terminated  or revoked sooner.       Influenza A by PCR NEGATIVE NEGATIVE Final   Influenza B by PCR NEGATIVE NEGATIVE Final    Comment: (NOTE) The Xpert Xpress SARS-CoV-2/FLU/RSV plus assay is intended as an aid in the diagnosis of influenza from Nasopharyngeal swab specimens and should not be used as a sole basis for treatment. Nasal washings and aspirates are unacceptable for Xpert Xpress SARS-CoV-2/FLU/RSV testing.  Fact Sheet for Patients: BloggerCourse.com  Fact Sheet for Healthcare Providers: SeriousBroker.it  This test is not yet approved or cleared by the [place] FDA and has been authorized for detection and/or diagnosis of SARS-CoV-2 by FDA under an Emergency Use Authorization (EUA). This EUA will remain in effect (meaning this test can be used) for the duration of the COVID-19 declaration under Section 564(b)(1) of the Act, 21 U.S.C. section 360bbb-3(b)(1), unless the authorization is terminated or revoked.  Performed at [facility], [address] [place], [place] [address]     Procedures and diagnostic studies:  CT CHEST WO CONTRAST  Result Date: 05/09/2021 CLINICAL DATA:  Pneumothorax suspected EXAM: CT CHEST WITHOUT CONTRAST TECHNIQUE: Multidetector CT imaging of the chest was performed following the standard protocol without IV contrast. COMPARISON:  12/10/2020 FINDINGS: Cardiovascular: Overall normal heart size. No pericardial effusion. Extensive atheromatous calcification of the aorta and coronaries. Mediastinum/Nodes: No adenopathy or mass. Lungs/Pleura: The central airways are clear. Airspace opacity and some consultative opacity in the lower  lobes which accounts for the radiographic findings. No edema, effusion, or pneumothorax. Upper Abdomen: Negative Musculoskeletal: Spondylosis with kyphosis and bridging osteophytes. Generalized osteopenia. Deformity from remote proximal right humerus fracture. IMPRESSION: 1. Atelectasis/pneumonia at the lung bases. No pneumothorax or subdiaphragmatic pneumoperitoneum. 2.  Aortic Atherosclerosis (ICD10-I70.0).  Coronary atherosclerosis. Electronically Signed   By: Tiburcio Pea M.D.   On: 05/09/2021 11:11   CT ABDOMEN PELVIS W  CONTRAST  Result Date: 05/07/2021 CLINICAL DATA:  Right-sided abdominal pain for 4 days, nausea and vomiting EXAM: CT ABDOMEN AND PELVIS WITH CONTRAST TECHNIQUE: Multidetector CT imaging of the abdomen and pelvis was performed using the standard protocol following bolus administration of intravenous contrast. CONTRAST:  OMNIPAQUE IOHEXOL 300 MG/ML  SOLN COMPARISON:  12/10/2020 FINDINGS: Lower chest: Scattered hypoventilatory changes at the lung bases. No acute pleural or parenchymal lung disease. Hepatobiliary: No focal liver abnormality is seen. No gallstones, gallbladder wall thickening, or biliary dilatation. Pancreas: Unremarkable. No pancreatic ductal dilatation or surrounding inflammatory changes. Spleen: Normal in size without focal abnormality. Adrenals/Urinary Tract: Stable left renal cyst. The kidneys otherwise enhance normally and symmetrically. The adrenals are stable. Bladder is unremarkable. Stomach/Bowel: There is diffuse small bowel dilation measuring up to 5.7 cm in diameter, with numerous gas fluid levels. Gradual transition within the ileum to normal caliber small bowel. Moderate gas and stool throughout the colon to the level of the rectum. No inflammatory changes. Vascular/Lymphatic: Aortic atherosclerosis. No enlarged abdominal or pelvic lymph nodes. Reproductive: Prostate is unremarkable. Other: No free fluid or free gas.  No abdominal wall hernia.  Musculoskeletal: Compression deformity within the superior endplate of the L4 vertebral body has developed in the interim since prior exam, with prominent sclerosis along the fracture line suggesting subacute fracture. No significant retropulsion. No other acute bony abnormalities. Reconstructed images demonstrate no additional findings. IMPRESSION: 1. Small-bowel obstruction, with diffuse jejunal dilation and gradual transition point within the terminal ileum. Gas and stool throughout the colon suggest an early or intermittent obstruction. Continued radiographic follow-up is recommended. 2. Age indeterminate L4 compression deformity, new since 12/10/2020. Sclerosis along the fracture line suggests subacute fracture. 3.  Aortic Atherosclerosis (ICD10-I70.0). Electronically Signed   By: Sharlet Salina M.D.   On: 05/07/2021 19:29   US Venous Img Lower Bilateral (DVT)  Result Date: 05/08/2021 CLINICAL DATA:  Lower extremity edema peer EXAM: BILATERAL LOWER EXTREMITY VENOUS DOPPLER ULTRASOUND TECHNIQUE: Gray-scale sonography with graded compression, as well as color Doppler and duplex ultrasound were performed to evaluate the lower extremity deep venous systems from the level of the common femoral vein and including the common femoral, femoral, profunda femoral, popliteal and calf veins including the posterior tibial, peroneal and gastrocnemius veins when visible. The superficial great saphenous vein was also interrogated. Spectral Doppler was utilized to evaluate flow at rest and with distal augmentation maneuvers in the common femoral, femoral and popliteal veins. COMPARISON:  None. FINDINGS: RIGHT LOWER EXTREMITY Common Femoral Vein: No evidence of thrombus. Normal compressibility, respiratory phasicity and response to augmentation. Saphenofemoral Junction: No evidence of thrombus. Normal compressibility and flow on color Doppler imaging. Profunda Femoral Vein: No evidence of thrombus. Normal compressibility and  flow on color Doppler imaging. Femoral Vein: No evidence of thrombus. Normal compressibility, respiratory phasicity and response to augmentation. Popliteal Vein: No evidence of thrombus. Normal compressibility, respiratory phasicity and response to augmentation. Calf Veins: No evidence of thrombus. Normal compressibility and flow on color Doppler imaging. Superficial Great Saphenous Vein: No evidence of thrombus. Normal compressibility. Venous Reflux:  None. Other Findings: No evidence of superficial thrombophlebitis or abnormal fluid collection. LEFT LOWER EXTREMITY Common Femoral Vein: No evidence of thrombus. Normal compressibility, respiratory phasicity and response to augmentation. Saphenofemoral Junction: No evidence of thrombus. Normal compressibility and flow on color Doppler imaging. Profunda Femoral Vein: No evidence of thrombus. Normal compressibility and flow on color Doppler imaging. Femoral Vein: No evidence of thrombus. Normal compressibility, respiratory phasicity and response to  augmentation. Popliteal Vein: No evidence of thrombus. Normal compressibility, respiratory phasicity and response to augmentation. Calf Veins: No evidence of thrombus. Normal compressibility and flow on color Doppler imaging. Superficial Great Saphenous Vein: No evidence of thrombus. Normal compressibility. Venous Reflux:  None. Other Findings: No evidence of superficial thrombophlebitis or abnormal fluid collection. IMPRESSION: No evidence of deep venous thrombosis in either lower extremity. Electronically Signed   By: Irish Lack M.D.   On: 05/08/2021 14:46   DG ABD ACUTE 2+V W 1V CHEST  Result Date: 05/09/2021 CLINICAL DATA:  Follow-up bowel obstruction. EXAM: DG ABDOMEN ACUTE WITH 1 VIEW CHEST COMPARISON:  May 08, 2021 FINDINGS: The small bowel loops are normal in caliber. Air is identified in the colon. There is question air in the right hemithorax and question air in the right subdiaphragmatic area.  Recommend further evaluation with a CT scan. IMPRESSION: No small bowel obstruction. There is question air in the right hemithorax and question air in the right subdiaphragmatic area. Recommend further evaluation with a CT scan. Critical Value/emergent results were called by telephone at the time of interpretation on 05/09/2021 at 8:28 am to provider DIEGO PABON , who verbally acknowledged these results. Electronically Signed   By: Sherian Rein M.D.   On: 05/09/2021 08:29   DG ABD ACUTE 2+V W 1V CHEST  Result Date: 05/08/2021 CLINICAL DATA:  70 year old male with history of small-bowel obstruction. EXAM: DG ABDOMEN ACUTE WITH 1 VIEW CHEST COMPARISON:  05/08/2021. FINDINGS: Nasogastric tube coiled with tip in the proximal stomach. Lung volumes are low. Bibasilar opacities which may reflect areas of atelectasis and/or consolidation. No definite pleural effusions. No pneumothorax. No evidence of pulmonary edema. Heart size is mildly enlarged. Upper mediastinal contours are within normal limits. Atherosclerotic calcifications in the thoracic aorta. Numerous dilated loops of small bowel are noted measuring up to 6.3 cm in diameter. Some colonic gas and stool is also noted. Iodinated contrast material within the lumen of the urinary bladder. No pneumoperitoneum. IMPRESSION: 1. Persistent small bowel obstruction. 2. No pneumoperitoneum. 3. Low lung volumes with bibasilar areas of atelectasis and/or consolidation. 4. Aortic atherosclerosis. 5. Mild cardiomegaly. Electronically Signed   By: Trudie Reed M.D.   On: 05/08/2021 07:32   DG Abd Portable 1 View  Result Date: 05/08/2021 CLINICAL DATA:  NG placement. EXAM: PORTABLE ABDOMEN - 1 VIEW COMPARISON:  CT dated 05/07/2021. FINDINGS: Enteric tube with tip in the left upper abdomen, likely in the gastric fundus. Multiple air distended loops of small bowel measure up to 4.5 cm. IMPRESSION: Enteric tube with tip in the gastric fundus. Electronically Signed   By:  Elgie Collard M.D.   On: 05/08/2021 01:07               LOS: 2 days   Edward Thompson  [facility]   Pager on [email or web address]. If 7PM-7AM, please contact night-coverage at [email or web address]     05/09/2021, 2:01 PM

## 2021-05-09 NOTE — Progress Notes (Signed)
Patient pulled NGT out between 1850 and 1900 tonight. Patient states he needs to eat. Surgery and hospitalist notified . Per MD leave tube out tonight and reassess in the morning. Anselm Jungling

## 2021-05-09 NOTE — Progress Notes (Signed)
Patient ID: Edward Thompson, male   DOB: Nov 16, 1950, 70 y.o.   MRN: [id number]     SURGICAL PROGRESS NOTE   Hospital Day(s): 2.   Interval History: Patient seen and examined, no acute events or new complaints overnight. Patient reports feeling well.  He denies nausea or vomiting.  He denies abdominal pain.  Vital signs in last 24 hours: [min-max] current  Temp:  [98.2 F (36.8 C)-99 F (37.2 C)] 98.2 F (36.8 C) (12/31 0837) Pulse Rate:  [81-104] 104 (12/31 0837) Resp:  [15-20] 17 (12/31 0837) BP: (105-149)/(50-79) 149/79 (12/31 0837) SpO2:  [90 %-100 %] 90 % (12/31 0837)     Height: 5\' 4"  (162.6 cm) Weight: 77.1 kg BMI (Calculated): 29.17   Physical Exam:  Constitutional: alert, cooperative and no distress  Respiratory: breathing non-labored at rest  Cardiovascular: regular rate and sinus rhythm  Gastrointestinal: soft, non-tender, and non-distended  Labs:  CBC Latest Ref Rng & Units 05/08/2021 05/07/2021 12/13/2020  WBC 4.0 - 10.5 K/uL 6.3 7.3 7.9  Hemoglobin 13.0 - 17.0 g/dL 11.9(L) 12.9(L) 10.9(L)  Hematocrit 39.0 - 52.0 % 35.0(L) 38.2(L) 32.3(L)  Platelets 150 - 400 K/uL 186 237 153   CMP Latest Ref Rng & Units 05/09/2021 05/08/2021 05/07/2021  Glucose 70 - 99 mg/dL 05/09/2021) 93 49(F)  BUN 8 - 23 mg/dL 14 22 026(V)  Creatinine 0.61 - 1.24 mg/dL 78(H) 8.85(O) 2.77(A  Sodium 135 - 145 mmol/L 136 133(L) 129(L)  Potassium 3.5 - 5.1 mmol/L 3.2(L) 3.1(L) 3.9  Chloride 98 - 111 mmol/L 107 101 94(L)  CO2 22 - 32 mmol/L 16(L) 19(L) 25  Calcium 8.9 - 10.3 mg/dL 1.28) 7.8(M) 9.2  Total Protein 6.5 - 8.1 g/dL - - 7.6  Total Bilirubin 0.3 - 1.2 mg/dL - - 1.3(H)  Alkaline Phos 38 - 126 U/L - - 111  AST 15 - 41 U/L - - 21  ALT 0 - 44 U/L - - 21    Imaging studies: I personally evaluated the images of the abdominal x-ray.  There is significant improvement in the small bowel dilation.  Radiologist reported normal bowel pattern.  Radiology is concerned about intrathoracic or  subdiaphragmatic air and CT of the chest was recommended.   Assessment/Plan:  70 y.o. male with small bowel obstruction, complicated by pertinent comorbidities including hyperlipidemia, diabetes mellitus, viral meningitis, vertigo, syncope, OSA, seizure, CAD, stent placement, carotid artery stenosis (s/p of left endarterectomy)..  Patient remove NGT yesterday.  There has been no nausea or vomiting episodes.  He refused to have NGT placed again.  Abdominal x-ray with improvement of bowel obstruction.  There is concern of intrathoracic or subdiaphragmatic air.  CT scan of the chest ordered.  We will follow closely.  Abdominal exam is unremarkable at this moment.  If CT scan of the chest is negative patient can have trial of clear liquid diet.  We will continue to follow with you.  62, MD

## 2021-05-10 DIAGNOSIS — K56609 Unspecified intestinal obstruction, unspecified as to partial versus complete obstruction: Secondary | ICD-10-CM | POA: Diagnosis not present

## 2021-05-10 LAB — URINE CULTURE: Culture: 70000 — AB

## 2021-05-10 MED ORDER — NITROFURANTOIN MONOHYD MACRO 100 MG PO CAPS
100.0000 mg | ORAL_CAPSULE | Freq: Two times a day (BID) | ORAL | Status: DC
  Administered 2021-05-10 – 2021-05-11 (×3): 100 mg via ORAL
  Filled 2021-05-10 (×4): qty 1

## 2021-05-10 NOTE — Progress Notes (Signed)
[facility]  - [place] at [facility]   PATIENT NAME: Edward Thompson    MR#:  [id number]  DATE OF BIRTH:  [birth year]  SUBJECTIVE:   patient tolerating clear liquid well. He had couple bowel movements. Most recent one was yesterday no vomiting. NG removed. Passing gas. Patient does not ambulate much secondary to his foot drop. At home he is able to transfer from bed to chair. He can walk with some support.  Uses self catheterization few times a day. REVIEW OF SYSTEMS:   Review of Systems  Constitutional:  Negative for chills, fever and weight loss.  HENT:  Negative for ear discharge, ear pain and nosebleeds.   Eyes:  Negative for blurred vision, pain and discharge.  Respiratory:  Negative for sputum production, shortness of breath, wheezing and stridor.   Cardiovascular:  Negative for chest pain, palpitations, orthopnea and PND.  Gastrointestinal:  Negative for abdominal pain, diarrhea, nausea and vomiting.  Genitourinary:  Negative for frequency and urgency.  Musculoskeletal:  Negative for back pain and joint pain.  Neurological:  Positive for weakness. Negative for sensory change, speech change and focal weakness.  Psychiatric/Behavioral:  Negative for depression and hallucinations. The patient is not nervous/anxious.   Tolerating Diet: yes Tolerating PT: not needed  DRUG ALLERGIES:  No Known Allergies  VITALS:  Blood pressure 111/66, pulse 86, temperature 98.6 F (37 C), resp. rate 20, height 5\' 4"  (1.626 m), weight 77.1 kg, SpO2 94 %.  PHYSICAL EXAMINATION:   Physical Exam  GENERAL:  71 y.o.-year-old patient lying in the bed with no acute distress.  HEENT: Head atraumatic, normocephalic. Oropharynx and nasopharynx clear.  LUNGS: Normal breath sounds bilaterally, no wheezing, rales, rhonchi. No use of accessory muscles of respiration.  CARDIOVASCULAR: S1, S2 normal. No murmurs, rubs, or gallops.  ABDOMEN: Soft, nontender, nondistended. Bowel sounds present.  No organomegaly or mass.  EXTREMITIES: No cyanosis, clubbing or edema b/l.    NEUROLOGIC: nonfocal PSYCHIATRIC:  patient is alert and oriented x 3.  SKIN: No obvious rash, lesion, or ulcer.   LABORATORY PANEL:  CBC Recent Labs  Lab 05/08/21 0822  WBC 6.3  HGB 11.9*  HCT 35.0*  PLT 186    Chemistries  Recent Labs  Lab 05/07/21 1810 05/08/21 0822 05/09/21 0504  NA 129*   < > 136  K 3.9   < > 3.2*  CL 94*   < > 107  CO2 25   < > 16*  GLUCOSE 134*   < > 66*  BUN 36*   < > 14  CREATININE 0.65   < > 0.51*  CALCIUM 9.2   < > 8.4*  MG  --   --  1.8  AST 21  --   --   ALT 21  --   --   ALKPHOS 111  --   --   BILITOT 1.3*  --   --    < > = values in this interval not displayed.   Cardiac Enzymes No results for input(s): TROPONINI in the last 168 hours. RADIOLOGY:  CT CHEST WO CONTRAST  Result Date: 05/09/2021 CLINICAL DATA:  Pneumothorax suspected EXAM: CT CHEST WITHOUT CONTRAST TECHNIQUE: Multidetector CT imaging of the chest was performed following the standard protocol without IV contrast. COMPARISON:  12/10/2020 FINDINGS: Cardiovascular: Overall normal heart size. No pericardial effusion. Extensive atheromatous calcification of the aorta and coronaries. Mediastinum/Nodes: No adenopathy or mass. Lungs/Pleura: The central airways are clear. Airspace opacity and some consultative opacity in the  lower lobes which accounts for the radiographic findings. No edema, effusion, or pneumothorax. Upper Abdomen: Negative Musculoskeletal: Spondylosis with kyphosis and bridging osteophytes. Generalized osteopenia. Deformity from remote proximal right humerus fracture. IMPRESSION: 1. Atelectasis/pneumonia at the lung bases. No pneumothorax or subdiaphragmatic pneumoperitoneum. 2.  Aortic Atherosclerosis (ICD10-I70.0).  Coronary atherosclerosis. Electronically Signed   By: Tiburcio Pea M.D.   On: 05/09/2021 11:11   US Venous Img Lower Bilateral (DVT)  Result Date: 05/08/2021 CLINICAL  DATA:  Lower extremity edema peer EXAM: BILATERAL LOWER EXTREMITY VENOUS DOPPLER ULTRASOUND TECHNIQUE: Gray-scale sonography with graded compression, as well as color Doppler and duplex ultrasound were performed to evaluate the lower extremity deep venous systems from the level of the common femoral vein and including the common femoral, femoral, profunda femoral, popliteal and calf veins including the posterior tibial, peroneal and gastrocnemius veins when visible. The superficial great saphenous vein was also interrogated. Spectral Doppler was utilized to evaluate flow at rest and with distal augmentation maneuvers in the common femoral, femoral and popliteal veins. COMPARISON:  None. FINDINGS: RIGHT LOWER EXTREMITY Common Femoral Vein: No evidence of thrombus. Normal compressibility, respiratory phasicity and response to augmentation. Saphenofemoral Junction: No evidence of thrombus. Normal compressibility and flow on color Doppler imaging. Profunda Femoral Vein: No evidence of thrombus. Normal compressibility and flow on color Doppler imaging. Femoral Vein: No evidence of thrombus. Normal compressibility, respiratory phasicity and response to augmentation. Popliteal Vein: No evidence of thrombus. Normal compressibility, respiratory phasicity and response to augmentation. Calf Veins: No evidence of thrombus. Normal compressibility and flow on color Doppler imaging. Superficial Great Saphenous Vein: No evidence of thrombus. Normal compressibility. Venous Reflux:  None. Other Findings: No evidence of superficial thrombophlebitis or abnormal fluid collection. LEFT LOWER EXTREMITY Common Femoral Vein: No evidence of thrombus. Normal compressibility, respiratory phasicity and response to augmentation. Saphenofemoral Junction: No evidence of thrombus. Normal compressibility and flow on color Doppler imaging. Profunda Femoral Vein: No evidence of thrombus. Normal compressibility and flow on color Doppler imaging.  Femoral Vein: No evidence of thrombus. Normal compressibility, respiratory phasicity and response to augmentation. Popliteal Vein: No evidence of thrombus. Normal compressibility, respiratory phasicity and response to augmentation. Calf Veins: No evidence of thrombus. Normal compressibility and flow on color Doppler imaging. Superficial Great Saphenous Vein: No evidence of thrombus. Normal compressibility. Venous Reflux:  None. Other Findings: No evidence of superficial thrombophlebitis or abnormal fluid collection. IMPRESSION: No evidence of deep venous thrombosis in either lower extremity. Electronically Signed   By: Irish Lack M.D.   On: 05/08/2021 14:46   DG ABD ACUTE 2+V W 1V CHEST  Result Date: 05/09/2021 CLINICAL DATA:  Follow-up bowel obstruction. EXAM: DG ABDOMEN ACUTE WITH 1 VIEW CHEST COMPARISON:  May 08, 2021 FINDINGS: The small bowel loops are normal in caliber. Air is identified in the colon. There is question air in the right hemithorax and question air in the right subdiaphragmatic area. Recommend further evaluation with a CT scan. IMPRESSION: No small bowel obstruction. There is question air in the right hemithorax and question air in the right subdiaphragmatic area. Recommend further evaluation with a CT scan. Critical Value/emergent results were called by telephone at the time of interpretation on 05/09/2021 at 8:28 am to provider DIEGO PABON , who verbally acknowledged these results. Electronically Signed   By: Sherian Rein M.D.   On: 05/09/2021 08:29   ASSESSMENT AND PLAN:  AKIEL FENNELL is a 71 y.o. male with medical history significant for CAD s/p coronary stent, seizure disorder, hypertension,  sleep apnea, who presented to the hospital because of nausea, abdominal pain and vomiting.  He was found to have small bowel obstruction.     SBO//ileus improved with conservative management --managed conservatively with gastric decompression with NG tube, IV fluids, analgesics and  antiemetics. -- Had couple BMs. No vomiting. Tolerating clears. Discussed with Dr. Claudine Mouton okay to advance diet -- continue stool softeners  seizure disorder == continue oral Depakote and Dilantin  abnormal UA neurogenic bladder with history of self catheterization at home -- E. coli on urine culture -- nitrofurantoin for five days  overall improving. Will discharged to home tomorrow if remains stable.   Procedures: Family communication : daughter [name] Consults : surgery CODE STATUS: full DVT Prophylaxis : SCD Level of care: Med-Surg Status is: Inpatient  Remains inpatient appropriate because: continue to monitor one more day for SBO        TOTAL TIME TAKING CARE OF THIS PATIENT: 25 minutes.  >50% time spent on counselling and coordination of care  Note: This dictation was prepared with Dragon dictation along with smaller phrase technology. Any transcriptional errors that result from this process are unintentional.  Enedina Finner M.D    [facility]   CC: Primary care physician; Sherron Monday, MD Patient ID: [name], male   DOB: 10-24-1950, 71 y.o.   MRN: [id number]

## 2021-05-10 NOTE — Progress Notes (Signed)
Patient ID: Edward Thompson, male   DOB: 1950/12/10, 71 y.o.   MRN: [id number]     [place] Hospital Day(s): 3.   Interval History: Patient seen and examined, no acute events or new complaints overnight. Patient reports feeling well.  He denies nausea or vomiting, following his full liquid lunch.  He denies abdominal pain.  Reports good bowel function.  Vital signs in last 24 hours: [min-max] current  Temp:  [98.5 F (36.9 C)-98.8 F (37.1 C)] 98.6 F (37 C) (01/01 1003) Pulse Rate:  [84-91] 86 (01/01 1003) Resp:  [17-20] 20 (01/01 1003) BP: (111-128)/(56-66) 111/66 (01/01 1003) SpO2:  [94 %-97 %] 94 % (01/01 1003)     Height: 5\' 4"  (162.6 cm) Weight: 77.1 kg BMI (Calculated): 29.17   Physical Exam:  Constitutional: alert, cooperative and no distress  Respiratory: breathing non-labored at rest  Cardiovascular: regular rate and sinus rhythm  Gastrointestinal: soft, non-tender, and non-distended  Labs:  CBC Latest Ref Rng & Units 05/08/2021 05/07/2021 12/13/2020  WBC 4.0 - 10.5 K/uL 6.3 7.3 7.9  Hemoglobin 13.0 - 17.0 g/dL 11.9(L) 12.9(L) 10.9(L)  Hematocrit 39.0 - 52.0 % 35.0(L) 38.2(L) 32.3(L)  Platelets 150 - 400 K/uL 186 237 153   CMP Latest Ref Rng & Units 05/09/2021 05/08/2021 05/07/2021  Glucose 70 - 99 mg/dL 66(L) 93 134(H)  BUN 8 - 23 mg/dL 14 22 36(H)  Creatinine 0.61 - 1.24 mg/dL 0.51(L) 0.56(L) 0.65  Sodium 135 - 145 mmol/L 136 133(L) 129(L)  Potassium 3.5 - 5.1 mmol/L 3.2(L) 3.1(L) 3.9  Chloride 98 - 111 mmol/L 107 101 94(L)  CO2 22 - 32 mmol/L 16(L) 19(L) 25  Calcium 8.9 - 10.3 mg/dL 8.4(L) 8.5(L) 9.2  Total Protein 6.5 - 8.1 g/dL - - 7.6  Total Bilirubin 0.3 - 1.2 mg/dL - - 1.3(H)  Alkaline Phos 38 - 126 U/L - - 111  AST 15 - 41 U/L - - 21  ALT 0 - 44 U/L - - 21       Assessment/Plan:  71 y.o. male with small bowel obstruction, complicated by pertinent comorbidities including hyperlipidemia, diabetes mellitus, viral meningitis, vertigo,  syncope, OSA, seizure, CAD, stent placement, carotid artery stenosis (s/p of left endarterectomy)..  His abdomen is benign, tolerating full liquid diet.  No evidence of persisting small bowel obstruction.  May advance diet as tolerated.  Ronny Bacon, M.D., [facility]  05/10/2021 ; 2:11 PM

## 2021-05-11 DIAGNOSIS — K56609 Unspecified intestinal obstruction, unspecified as to partial versus complete obstruction: Secondary | ICD-10-CM | POA: Diagnosis not present

## 2021-05-11 MED ORDER — NITROFURANTOIN MONOHYD MACRO 100 MG PO CAPS: 100.0000 mg | ORAL_CAPSULE | Freq: Two times a day (BID) | ORAL | 0 refills | Status: AC

## 2021-05-11 NOTE — Care Management Important Message (Signed)
Important Message  Patient Details  Name: BEAUREGARD ZENDER MRN: [id number] Date of Birth: [birth year]   Medicare Important Message Given:  Yes     Dannette Barbara 05/11/2021, 10:43 AM

## 2021-05-11 NOTE — Discharge Summary (Signed)
[facility] [place] NAME: Edward Thompson    MR#:  [id number]  DATE OF BIRTH:  Oct 26, 1950  DATE OF ADMISSION:  05/07/2021 ADMITTING PHYSICIAN: Rise Patience, MD  DATE OF DISCHARGE: 05/11/2021  PRIMARY CARE PHYSICIAN: Jodi Marble, MD    ADMISSION DIAGNOSIS:  Edema [R60.9] Lower urinary tract infectious disease [N39.0] SBO (small bowel obstruction) (HCC) [K56.609] Nausea and vomiting, unspecified vomiting type [R11.2]  DISCHARGE DIAGNOSIS:  SBO/Ileus--improved  SECONDARY DIAGNOSIS:   Past Medical History:  Diagnosis Date   CAD (coronary artery disease)    Carotid stenosis, left 08/2017   Diabetes mellitus without complication (HCC)    Foot drop    Fracture of neck ([place]) 2008   fell off a roof. required halo x 4 months. also fractured alot of vertebrae   Heart attack ([place]) 2015   Heart disease    Hepatitis    6th grade    Hyperlipidemia    Hypertension    Myocardial infarction acute ([place]) 2015   Seizures ([place])    taking phenobarbitol and dilantin. LAST SEIZURE WAS 2016. well controlled on meds   Sleep apnea    USES CPAP   Syncope 2019   Vertigo    Viral meningitis     HOSPITAL COURSE:  Edward Thompson is a 71 y.o. male with medical history significant for CAD s/p coronary stent, seizure disorder, hypertension, sleep apnea, who presented to the hospital because of nausea, abdominal pain and vomiting.  He was found to have small bowel obstruction.     SBO//ileus improved with conservative management --managed conservatively with gastric decompression with NG tube, IV fluids, analgesics and antiemetics. -- Had couple BMs. No vomiting. Tolerating clears. Discussed with Dr. Christian Mate okay to advance diet -- continue stool softeners --Tolerating soft diet. No complaints today   seizure disorder -- continue oral Depakote and Dilantin   abnormal UA neurogenic bladder with history of self catheterization at home --  E. coli on urine culture -- nitrofurantoin for five days --d/c foley today   overall improving. Will discharged to home today.     Procedures: Family communication : daughter Rip Harbour 1/1 Consults : surgery CODE STATUS: full DVT Prophylaxis : SCD Level of care: Med-Surg   CONSULTS OBTAINED:  Treatment Team:  Jules Husbands, MD  DRUG ALLERGIES:  No Known Allergies  DISCHARGE MEDICATIONS:   Allergies as of 05/11/2021   No Known Allergies      Medication List     TAKE these medications    Accu-Chek Guide test strip Generic drug: glucose blood   Accu-Chek Guide w/Device Kit   Accu-Chek Softclix Lancets lancets   acetaminophen 500 MG tablet Commonly known as: TYLENOL Take 500 mg by mouth every 6 (six) hours as needed.   aspirin EC 81 MG tablet Take 325 mg by mouth at bedtime.   clopidogrel 75 MG tablet Commonly known as: PLAVIX Take 75 mg by mouth at bedtime.   ezetimibe 10 MG tablet Commonly known as: ZETIA Take 10 mg by mouth daily.   feeding supplement (GLUCERNA SHAKE) Liqd Take 237 mLs by mouth 2 (two) times daily between meals.   isosorbide mononitrate 30 MG 24 hr tablet Commonly known as: IMDUR Take 30 mg by mouth at bedtime.   lisinopril 5 MG tablet Commonly known as: ZESTRIL Take 5 mg by mouth daily. Notes to patient: HOLD if SBP <120   meclizine 25 MG tablet Commonly known as: ANTIVERT Take 50 mg  by mouth 2 (two) times daily as needed for dizziness.   metFORMIN 500 MG tablet Commonly known as: GLUCOPHAGE Take 500 mg by mouth daily.   metoprolol succinate 25 MG 24 hr tablet Commonly known as: TOPROL-XL Take 0.5 tablets (12.5 mg total) by mouth daily. Notes to patient: HOLD IF SBP < 120   multivitamin tablet Take 1 tablet by mouth daily.   nitrofurantoin (macrocrystal-monohydrate) 100 MG capsule Commonly known as: MACROBID Take 1 capsule (100 mg total) by mouth every 12 (twelve) hours for 4 days.   nitroGLYCERIN 0.4 MG SL  tablet Commonly known as: NITROSTAT Place 0.4 mg under the tongue every 5 (five) minutes as needed for chest pain.   pantoprazole 40 MG tablet Commonly known as: PROTONIX Take 40 mg by mouth daily.   PHENobarbital 64.8 MG tablet Commonly known as: LUMINAL Take 2 tablets (129.6 mg total) by mouth at bedtime.   phenytoin 100 MG ER capsule Commonly known as: DILANTIN Take 300 mg by mouth at bedtime.   polyethylene glycol 17 g packet Commonly known as: MIRALAX / GLYCOLAX Take 17 g by mouth daily.   rosuvastatin 40 MG tablet Commonly known as: CRESTOR Take 40 mg by mouth at bedtime.   tamsulosin 0.4 MG Caps capsule Commonly known as: FLOMAX Take 1 capsule (0.4 mg total) by mouth daily.        If you experience worsening of your admission symptoms, develop shortness of breath, life threatening emergency, suicidal or homicidal thoughts you must seek medical attention immediately by calling 911 or calling your MD immediately  if symptoms less severe.  You Must read complete instructions/literature along with all the possible adverse reactions/side effects for all the Medicines you take and that have been prescribed to you. Take any new Medicines after you have completely understood and accept all the possible adverse reactions/side effects.   Please note  You were cared for by a hospitalist during your hospital stay. If you have any questions about your discharge medications or the care you received while you were in the hospital after you are discharged, you can call the unit and asked to speak with the hospitalist on call if the hospitalist that took care of you is not available. Once you are discharged, your primary care physician will handle any further medical issues. Please note that NO REFILLS for any discharge medications will be authorized once you are discharged, as it is imperative that you return to your primary care physician (or establish a relationship with a primary care  physician if you do not have one) for your aftercare needs so that they can reassess your need for medications and monitor your lab values. Today   SUBJECTIVE  Tolerating PO  soft diet No abd pain   VITAL SIGNS:  Blood pressure 117/67, pulse 77, temperature 97.8 F (36.6 C), temperature source Oral, resp. rate 17, height 5' 4"  (1.626 m), weight 77.1 kg, SpO2 96 %.  I/O:   Intake/Output Summary (Last 24 hours) at 05/11/2021 0759 Last data filed at 05/10/2021 1400 Gross per 24 hour  Intake 240 ml  Output 1300 ml  Net -1060 ml    PHYSICAL EXAMINATION:  GENERAL:  71 y.o.-year-old patient lying in the bed with no acute distress.  LUNGS: Normal breath sounds bilaterally, no wheezing, rales,rhonchi or crepitation. No use of accessory muscles of respiration.  CARDIOVASCULAR: S1, S2 normal. No murmurs, rubs, or gallops.  ABDOMEN: Soft, non-tender, non-distended. Bowel sounds present. No organomegaly or mass.  EXTREMITIES: No  pedal edema, cyanosis, or clubbing.  NEUROLOGIC: non-focal PSYCHIATRIC:  patient is alert and oriented x 3.  SKIN: No obvious rash, lesion, or ulcer.   DATA REVIEW:   CBC  Recent Labs  Lab 05/08/21 0822  WBC 6.3  HGB 11.9*  HCT 35.0*  PLT 186    Chemistries  Recent Labs  Lab 05/07/21 1810 05/08/21 0822 05/09/21 0504  NA 129*   < > 136  K 3.9   < > 3.2*  CL 94*   < > 107  CO2 25   < > 16*  GLUCOSE 134*   < > 66*  BUN 36*   < > 14  CREATININE 0.65   < > 0.51*  CALCIUM 9.2   < > 8.4*  MG  --   --  1.8  AST 21  --   --   ALT 21  --   --   ALKPHOS 111  --   --   BILITOT 1.3*  --   --    < > = values in this interval not displayed.    Microbiology Results   Recent Results (from the past 240 hour(s))  Urine Culture     Status: Abnormal   Collection Time: 05/07/21  9:11 PM   Specimen: Urine, Random  Result Value Ref Range Status   Specimen Description   Final    URINE, RANDOM Performed at [facility], [address],  [place], [place] [address]    Special Requests   Final    NONE Performed at [facility], [address] [place], [place] [address]    Culture (A)  Final    70,000 COLONIES/mL ESCHERICHIA COLI Confirmed Extended Spectrum Beta-Lactamase Producer (ESBL).  In bloodstream infections from ESBL organisms, carbapenems are preferred over piperacillin/tazobactam. They are shown to have a lower risk of mortality.    Report Status 05/10/2021 FINAL  Final   Organism ID, Bacteria ESCHERICHIA COLI (A)  Final      Susceptibility   Escherichia coli - MIC*    AMPICILLIN >=32 RESISTANT Resistant     CEFAZOLIN >=64 RESISTANT Resistant     CEFEPIME 16 RESISTANT Resistant     CEFTRIAXONE >=64 RESISTANT Resistant     CIPROFLOXACIN >=4 RESISTANT Resistant     GENTAMICIN >=16 RESISTANT Resistant     IMIPENEM <=0.25 SENSITIVE Sensitive     NITROFURANTOIN <=16 SENSITIVE Sensitive     TRIMETH/SULFA <=20 SENSITIVE Sensitive     AMPICILLIN/SULBACTAM >=32 RESISTANT Resistant     PIP/TAZO <=4 SENSITIVE Sensitive     * 70,000 COLONIES/mL ESCHERICHIA COLI  Resp Panel by RT-PCR (Flu A&B, Covid) Nasopharyngeal Swab     Status: None   Collection Time: 05/08/21  6:35 AM   Specimen: Nasopharyngeal Swab; Nasopharyngeal(NP) swabs in vial transport medium  Result Value Ref Range Status   SARS Coronavirus 2 by RT PCR NEGATIVE NEGATIVE Final    Comment: (NOTE) SARS-CoV-2 target nucleic acids are NOT DETECTED.  The SARS-CoV-2 RNA is generally detectable in upper respiratory specimens during the acute phase of infection. The lowest concentration of SARS-CoV-2 viral copies this assay can detect is 138 copies/mL. A negative result does not preclude SARS-Cov-2 infection and should not be used as the sole basis for treatment or other patient management decisions. A negative result may occur with  improper specimen collection/handling, submission of specimen other than nasopharyngeal swab, presence of viral mutation(s)  within the areas targeted by this assay, and inadequate number of viral copies(<138  copies/mL). A negative result must be combined with clinical observations, patient history, and epidemiological information. The expected result is Negative.  Fact Sheet for Patients:  EntrepreneurPulse.com.au  Fact Sheet for Healthcare Providers:  IncredibleEmployment.be  This test is no t yet approved or cleared by the [place] FDA and  has been authorized for detection and/or diagnosis of SARS-CoV-2 by FDA under an Emergency Use Authorization (EUA). This EUA will remain  in effect (meaning this test can be used) for the duration of the COVID-19 declaration under Section 564(b)(1) of the Act, 21 U.S.C.section 360bbb-3(b)(1), unless the authorization is terminated  or revoked sooner.       Influenza A by PCR NEGATIVE NEGATIVE Final   Influenza B by PCR NEGATIVE NEGATIVE Final    Comment: (NOTE) The Xpert Xpress SARS-CoV-2/FLU/RSV plus assay is intended as an aid in the diagnosis of influenza from Nasopharyngeal swab specimens and should not be used as a sole basis for treatment. Nasal washings and aspirates are unacceptable for Xpert Xpress SARS-CoV-2/FLU/RSV testing.  Fact Sheet for Patients: EntrepreneurPulse.com.au  Fact Sheet for Healthcare Providers: IncredibleEmployment.be  This test is not yet approved or cleared by the [place] FDA and has been authorized for detection and/or diagnosis of SARS-CoV-2 by FDA under an Emergency Use Authorization (EUA). This EUA will remain in effect (meaning this test can be used) for the duration of the COVID-19 declaration under Section 564(b)(1) of the Act, 21 U.S.C. section 360bbb-3(b)(1), unless the authorization is terminated or revoked.  Performed at [facility], [address] [place],  [address]     RADIOLOGY:  CT CHEST WO  CONTRAST  Result Date: 05/09/2021 CLINICAL DATA:  Pneumothorax suspected EXAM: CT CHEST WITHOUT CONTRAST TECHNIQUE: Multidetector CT imaging of the chest was performed following the standard protocol without IV contrast. COMPARISON:  12/10/2020 FINDINGS: Cardiovascular: Overall normal heart size. No pericardial effusion. Extensive atheromatous calcification of the aorta and coronaries. Mediastinum/Nodes: No adenopathy or mass. Lungs/Pleura: The central airways are clear. Airspace opacity and some consultative opacity in the lower lobes which accounts for the radiographic findings. No edema, effusion, or pneumothorax. Upper Abdomen: Negative Musculoskeletal: Spondylosis with kyphosis and bridging osteophytes. Generalized osteopenia. Deformity from remote proximal right humerus fracture. IMPRESSION: 1. Atelectasis/pneumonia at the lung bases. No pneumothorax or subdiaphragmatic pneumoperitoneum. 2.  Aortic Atherosclerosis (ICD10-I70.0).  Coronary atherosclerosis. Electronically Signed   By: Jorje Guild M.D.   On: 05/09/2021 11:11   DG ABD ACUTE 2+V W 1V CHEST  Result Date: 05/09/2021 CLINICAL DATA:  Follow-up bowel obstruction. EXAM: DG ABDOMEN ACUTE WITH 1 VIEW CHEST COMPARISON:  May 08, 2021 FINDINGS: The small bowel loops are normal in caliber. Air is identified in the colon. There is question air in the right hemithorax and question air in the right subdiaphragmatic area. Recommend further evaluation with a CT scan. IMPRESSION: No small bowel obstruction. There is question air in the right hemithorax and question air in the right subdiaphragmatic area. Recommend further evaluation with a CT scan. Critical Value/emergent results were called by telephone at the time of interpretation on 05/09/2021 at 8:28 am to provider DIEGO PABON , who verbally acknowledged these results. Electronically Signed   By: Abelardo Diesel M.D.   On: 05/09/2021 08:29     CODE STATUS:     Code Status Orders  (From  admission, onward)           Start     Ordered   05/07/21 2219  Full code  Continuous  05/07/21 2221           Code Status History     Date Active Date Inactive Code Status Order ID Comments User Context   12/10/2020 2352 12/13/2020 1912 Full Code [id number]  Athena Masse, MD ED   10/08/2020 1016 10/11/2020 1913 Full Code [id number]  Ivor Costa, MD ED   08/19/2017 1154 08/25/2017 1647 Full Code [id number]  Delana Meyer, Dolores Lory, MD Inpatient   02/05/2016 1758 02/07/2016 1939 Full Code [id number]  Demetrios Loll, MD Inpatient        TOTAL TIME TAKING CARE OF THIS PATIENT: 35 minutes.    Fritzi Mandes M.D  [facility]    CC: Primary care physician; Jodi Marble, MD

## 2021-05-11 NOTE — Evaluation (Signed)
Physical Therapy Evaluation Patient Details Name: Edward Thompson MRN: [id number] DOB: [birth year] Today's Date: 05/11/2021  History of Present Illness  71 y.o. male with history of CAD status post stenting, seizures, hypertension, sleep apnea presents to the ER with complaint of abdominal pain with nausea vomiting. He was found to have small bowel obstruction; managed conservatively.   Clinical Impression  Pt received in bed eating breakfast, agreeable to therapy. He lives with kids/grandkids at baseline with family available 24 hours/day. He typically uses w/c for mobility however he can walk household distances using RW. He does experience frequent LOB and has bilateral foot drop.   Pt sat up to EOB with SUP. STS, stand pivot transfer and ambulation all required CGA due to occasional LOB requiring external support to steady. Pt incontinent of bowels during ambulation - PT assisted with hygiene as pt was unable to stand with only single UE support of RW. Pt demo deficits in global strength, especially bilateral DF, and standing stability. Pt would benefit from HHPT to address deficits listed above to improve efficacy and safety of transfers, household mobility and ADLs.        Recommendations for follow up therapy are one component of a multi-disciplinary discharge planning process, led by the attending physician.  Recommendations may be updated based on patient status, additional functional criteria and insurance authorization.  Follow Up Recommendations Home health PT    Assistance Recommended at Discharge Intermittent Supervision/Assistance  Functional Status Assessment Patient has had a recent decline in their functional status and demonstrates the ability to make significant improvements in function in a reasonable and predictable amount of time.  Equipment Recommendations  None recommended by PT    Recommendations for Other Services       Precautions / Restrictions  Precautions Precautions: Fall Restrictions Weight Bearing Restrictions: No Other Position/Activity Restrictions: B foot drop, no AFOs      Mobility  Bed Mobility Overal bed mobility: Needs Assistance Bed Mobility: Supine to Sit     Supine to sit: Supervision     General bed mobility comments: increased time and effort    Transfers Overall transfer level: Needs assistance Equipment used: Rolling walker (2 wheels) Transfers: Sit to/from Stand;Bed to chair/wheelchair/BSC Sit to Stand: Min guard Stand pivot transfers: Min guard         General transfer comment: CGA to steady as pt obtains hip extension, 2 reps; stand pivot from EOB to recliner at end of session    Ambulation/Gait Ambulation/Gait assistance: Min guard Gait Distance (Feet): 50 Feet Assistive device: Rolling walker (2 wheels) Gait Pattern/deviations: Step-through pattern;Decreased stride length;Decreased dorsiflexion - right;Decreased dorsiflexion - left Gait velocity: decreased     General Gait Details: Increased hip and knee flexion to clear bilateral feet during swing through phase. Bilateral foot drop.  Stairs            Wheelchair Mobility    Modified Rankin (Stroke Patients Only)       Balance Overall balance assessment: Needs assistance Sitting-balance support: No upper extremity supported;Feet supported Sitting balance-Leahy Scale: Good     Standing balance support: Bilateral upper extremity supported;During functional activity;Reliant on assistive device for balance Standing balance-Leahy Scale: Poor Standing balance comment: CGA for occasional definite steadying during standing. Unable to remove UE support to perform standing hygiene.                             Pertinent Vitals/Pain Pain Assessment: No/denies pain  Home Living Family/patient expects to be discharged to:: Private residence Living Arrangements: Children;Other relatives (son and his  family) Available Help at Discharge: Family;Available 24 hours/day Type of Home: House Home Access: Ramped entrance       Home Layout: One level Home Equipment: Conservation officer, nature (2 wheels);Wheelchair - manual Additional Comments: Reports he lives with his son. Sleeps on the couch. Uses RW for short distance ambulation and transfers.    Prior Function Prior Level of Function : Needs assist;History of Falls (last six months)       Physical Assist : ADLs (physical)   ADLs (physical): IADLs Mobility Comments: Independent with household distance ambulation. w/c level for most mobility due to bilateral foot drop. Reports 4-5 falls in the last 6 months due to LOB. ADLs Comments: MOD I with ADLs, assist with all IADLs.     Hand Dominance        Extremity/Trunk Assessment   Upper Extremity Assessment Upper Extremity Assessment: Generalized weakness    Lower Extremity Assessment Lower Extremity Assessment: Generalized weakness (3+/5 B hip flexors, 4/5 B knee extensors, 0/5 B DF)    Cervical / Trunk Assessment Cervical / Trunk Assessment: Kyphotic  Communication   Communication: No difficulties  Cognition Arousal/Alertness: Awake/alert Behavior During Therapy: WFL for tasks assessed/performed;Flat affect Overall Cognitive Status: Within Functional Limits for tasks assessed                                 General Comments: A&Ox4        General Comments      Exercises     Assessment/Plan    PT Assessment All further PT needs can be met in the next venue of care  PT Problem List Decreased strength;Decreased mobility;Decreased safety awareness;Decreased activity tolerance;Decreased balance       PT Treatment Interventions      PT Goals (Current goals can be found in the Care Plan section)  Acute Rehab PT Goals Patient Stated Goal: to return to baseline PT Goal Formulation: With patient Time For Goal Achievement: 05/25/21 Potential to Achieve Goals:  Fair    Frequency     Barriers to discharge        Co-evaluation               AM-PAC PT "6 Clicks" Mobility  Outcome Measure Help needed turning from your back to your side while in a flat bed without using bedrails?: None Help needed moving from lying on your back to sitting on the side of a flat bed without using bedrails?: None Help needed moving to and from a bed to a chair (including a wheelchair)?: A Little Help needed standing up from a chair using your arms (e.g., wheelchair or bedside chair)?: A Little Help needed to walk in hospital room?: A Little Help needed climbing 3-5 steps with a railing? : A Lot 6 Click Score: 19    End of Session Equipment Utilized During Treatment: Gait belt Activity Tolerance: Patient tolerated treatment well Patient left: in chair;with call bell/phone within reach;with chair alarm set Nurse Communication: Mobility status PT Visit Diagnosis: Unsteadiness on feet (R26.81);Other abnormalities of gait and mobility (R26.89);Muscle weakness (generalized) (M62.81);Repeated falls (R29.6);Difficulty in walking, not elsewhere classified (R26.2)    Time: 4619-0122 PT Time Calculation (min) (ACUTE ONLY): 28 min   Charges:   PT Evaluation $PT Eval Moderate Complexity: 1 Mod PT Treatments $Therapeutic Activity: 8-22 mins  Patrina Levering PT, DPT 05/11/21 10:29 AM [phone]
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
## 2021-05-13 ENCOUNTER — Other Ambulatory Visit: Payer: Self-pay

## 2021-05-13 NOTE — Patient Outreach (Signed)
[facility]) Care Management  05/13/2021  Edward Thompson Apr 19, 1951 [id number]  [employer] member assigned to [name], RN from insurance. Patient discharged from [facility] for unspecified intestinal obstruction, unspecified as to partial versus complete obstruction.  Ina Homes [facility] Management Assistant [phone]

## 2021-05-13 NOTE — Patient Outreach (Signed)
[facility]) Care Management  05/13/2021  GURNIE DURIS 03/15/1951 [id number]     Transition of Care Referral  Referral Date: 1/042023 Referral Source:  Discharge Report Date of Discharge:05/10/2021 Facility: [facility]    Referral received. Transition of care calls being completed via EMMI-automated calls.      Plan: RN CM will close referral.   Antionette Fairy, RN,BSN,CCM [facility] Care Management Telephonic Care Management Coordinator Direct Phone: [phone] Toll Free: [phone] Fax: [phone]

## 2021-06-26 DIAGNOSIS — K922 Gastrointestinal hemorrhage, unspecified: Secondary | ICD-10-CM | POA: Diagnosis not present

## 2021-06-26 DIAGNOSIS — G40309 Generalized idiopathic epilepsy and epileptic syndromes, not intractable, without status epilepticus: Secondary | ICD-10-CM | POA: Diagnosis not present

## 2021-06-26 DIAGNOSIS — E118 Type 2 diabetes mellitus with unspecified complications: Secondary | ICD-10-CM | POA: Diagnosis not present

## 2021-06-29 ENCOUNTER — Other Ambulatory Visit: Payer: Self-pay | Admitting: Internal Medicine

## 2021-06-29 ENCOUNTER — Other Ambulatory Visit: Payer: Self-pay

## 2021-06-29 ENCOUNTER — Ambulatory Visit
Admission: RE | Admit: 2021-06-29 | Discharge: 2021-06-29 | Disposition: A | Discharge status: Home / Self Care | Payer: Medicare HMO | Source: Ambulatory Visit | Attending: Internal Medicine | Admitting: Internal Medicine

## 2021-06-29 ENCOUNTER — Ambulatory Visit
Admission: RE | Admit: 2021-06-29 | Discharge: 2021-06-29 | Disposition: A | Discharge status: Home / Self Care | Payer: Medicare HMO | Attending: Internal Medicine | Admitting: Internal Medicine

## 2021-06-29 DIAGNOSIS — W1830XA Fall on same level, unspecified, initial encounter: Secondary | ICD-10-CM | POA: Insufficient documentation

## 2021-06-29 DIAGNOSIS — W228XXA Striking against or struck by other objects, initial encounter: Secondary | ICD-10-CM | POA: Insufficient documentation

## 2021-06-29 DIAGNOSIS — S0993XA Unspecified injury of face, initial encounter: Secondary | ICD-10-CM | POA: Insufficient documentation

## 2021-06-29 DIAGNOSIS — Y9289 Other specified places as the place of occurrence of the external cause: Secondary | ICD-10-CM | POA: Diagnosis not present

## 2021-06-29 DIAGNOSIS — I10 Essential (primary) hypertension: Secondary | ICD-10-CM | POA: Diagnosis not present

## 2021-06-29 DIAGNOSIS — G40309 Generalized idiopathic epilepsy and epileptic syndromes, not intractable, without status epilepticus: Secondary | ICD-10-CM | POA: Diagnosis not present

## 2021-06-29 DIAGNOSIS — E118 Type 2 diabetes mellitus with unspecified complications: Secondary | ICD-10-CM | POA: Diagnosis not present

## 2021-06-29 DIAGNOSIS — I872 Venous insufficiency (chronic) (peripheral): Secondary | ICD-10-CM | POA: Diagnosis not present

## 2021-06-29 DIAGNOSIS — T1490XA Injury, unspecified, initial encounter: Secondary | ICD-10-CM

## 2021-06-29 DIAGNOSIS — S0083XA Contusion of other part of head, initial encounter: Secondary | ICD-10-CM | POA: Diagnosis not present

## 2021-06-29 DIAGNOSIS — W19XXXA Unspecified fall, initial encounter: Secondary | ICD-10-CM

## 2021-06-29 DIAGNOSIS — S0591XA Unspecified injury of right eye and orbit, initial encounter: Secondary | ICD-10-CM | POA: Diagnosis not present

## 2021-06-29 DIAGNOSIS — F329 Major depressive disorder, single episode, unspecified: Secondary | ICD-10-CM | POA: Diagnosis not present

## 2021-06-29 DIAGNOSIS — E785 Hyperlipidemia, unspecified: Secondary | ICD-10-CM | POA: Diagnosis not present

## 2021-06-29 DIAGNOSIS — E1342 Other specified diabetes mellitus with diabetic polyneuropathy: Secondary | ICD-10-CM | POA: Diagnosis not present

## 2021-06-29 DIAGNOSIS — G4733 Obstructive sleep apnea (adult) (pediatric): Secondary | ICD-10-CM | POA: Diagnosis not present

## 2021-07-07 DIAGNOSIS — E785 Hyperlipidemia, unspecified: Secondary | ICD-10-CM | POA: Diagnosis not present

## 2021-07-07 DIAGNOSIS — I351 Nonrheumatic aortic (valve) insufficiency: Secondary | ICD-10-CM | POA: Diagnosis not present

## 2021-07-07 DIAGNOSIS — I10 Essential (primary) hypertension: Secondary | ICD-10-CM | POA: Diagnosis not present

## 2021-07-07 DIAGNOSIS — I219 Acute myocardial infarction, unspecified: Secondary | ICD-10-CM | POA: Diagnosis not present

## 2021-07-07 DIAGNOSIS — I2510 Atherosclerotic heart disease of native coronary artery without angina pectoris: Secondary | ICD-10-CM | POA: Diagnosis not present

## 2021-07-07 DIAGNOSIS — I340 Nonrheumatic mitral (valve) insufficiency: Secondary | ICD-10-CM | POA: Diagnosis not present

## 2021-07-16 DIAGNOSIS — J323 Chronic sphenoidal sinusitis: Secondary | ICD-10-CM | POA: Diagnosis not present

## 2021-07-19 ENCOUNTER — Other Ambulatory Visit: Payer: Self-pay

## 2021-07-19 ENCOUNTER — Emergency Department: Payer: Medicare HMO

## 2021-07-19 ENCOUNTER — Emergency Department
Admission: EM | Admit: 2021-07-19 | Discharge: 2021-07-19 | Disposition: A | Discharge status: Home / Self Care | Payer: Medicare HMO | Attending: Emergency Medicine | Admitting: Emergency Medicine

## 2021-07-19 DIAGNOSIS — M79631 Pain in right forearm: Secondary | ICD-10-CM | POA: Diagnosis not present

## 2021-07-19 DIAGNOSIS — W19XXXA Unspecified fall, initial encounter: Secondary | ICD-10-CM

## 2021-07-19 DIAGNOSIS — S52251A Displaced comminuted fracture of shaft of ulna, right arm, initial encounter for closed fracture: Secondary | ICD-10-CM | POA: Insufficient documentation

## 2021-07-19 DIAGNOSIS — S52691A Other fracture of lower end of right ulna, initial encounter for closed fracture: Secondary | ICD-10-CM | POA: Diagnosis not present

## 2021-07-19 DIAGNOSIS — I10 Essential (primary) hypertension: Secondary | ICD-10-CM | POA: Diagnosis not present

## 2021-07-19 DIAGNOSIS — M25511 Pain in right shoulder: Secondary | ICD-10-CM | POA: Diagnosis not present

## 2021-07-19 DIAGNOSIS — I2510 Atherosclerotic heart disease of native coronary artery without angina pectoris: Secondary | ICD-10-CM | POA: Insufficient documentation

## 2021-07-19 DIAGNOSIS — S0990XA Unspecified injury of head, initial encounter: Secondary | ICD-10-CM | POA: Diagnosis not present

## 2021-07-19 DIAGNOSIS — S199XXA Unspecified injury of neck, initial encounter: Secondary | ICD-10-CM | POA: Diagnosis not present

## 2021-07-19 DIAGNOSIS — E119 Type 2 diabetes mellitus without complications: Secondary | ICD-10-CM | POA: Diagnosis not present

## 2021-07-19 DIAGNOSIS — R52 Pain, unspecified: Secondary | ICD-10-CM | POA: Diagnosis not present

## 2021-07-19 DIAGNOSIS — W050XXA Fall from non-moving wheelchair, initial encounter: Secondary | ICD-10-CM | POA: Diagnosis not present

## 2021-07-19 DIAGNOSIS — S59901A Unspecified injury of right elbow, initial encounter: Secondary | ICD-10-CM | POA: Diagnosis not present

## 2021-07-19 DIAGNOSIS — J3489 Other specified disorders of nose and nasal sinuses: Secondary | ICD-10-CM | POA: Diagnosis not present

## 2021-07-19 DIAGNOSIS — S52201A Unspecified fracture of shaft of right ulna, initial encounter for closed fracture: Secondary | ICD-10-CM

## 2021-07-19 DIAGNOSIS — I6782 Cerebral ischemia: Secondary | ICD-10-CM | POA: Diagnosis not present

## 2021-07-19 DIAGNOSIS — S52231A Displaced oblique fracture of shaft of right ulna, initial encounter for closed fracture: Secondary | ICD-10-CM | POA: Diagnosis not present

## 2021-07-19 DIAGNOSIS — G319 Degenerative disease of nervous system, unspecified: Secondary | ICD-10-CM | POA: Diagnosis not present

## 2021-07-19 DIAGNOSIS — M25519 Pain in unspecified shoulder: Secondary | ICD-10-CM | POA: Diagnosis not present

## 2021-07-19 DIAGNOSIS — M79604 Pain in right leg: Secondary | ICD-10-CM | POA: Diagnosis not present

## 2021-07-19 DIAGNOSIS — S59911A Unspecified injury of right forearm, initial encounter: Secondary | ICD-10-CM | POA: Diagnosis present

## 2021-07-19 MED ORDER — HYDROMORPHONE HCL 1 MG/ML IJ SOLN: 1.0000 mg | Freq: Once | INTRAMUSCULAR | Status: DC

## 2021-07-19 MED ORDER — OXYCODONE-ACETAMINOPHEN 5-325 MG PO TABS: 1.0000 | ORAL_TABLET | Freq: Three times a day (TID) | ORAL | 0 refills | Status: DC | PRN

## 2021-07-19 NOTE — ED Provider Notes (Signed)
? ?[facility] ?Provider Note ? ? ? Event Date/Time  ? First MD Initiated Contact with Patient 07/19/21 [phone]   ?  (approximate) ? ? ?History  ? ?Fall ? ? ?HPI ? ?Edward Thompson is a 71 y.o. male with a history of CAD, diabetes, hypertension who is wheelchair-bound who presents after a fall.  Patient apparently fell during transfer.  He reports he did hit his head but denies LOC.  Also reports pain to his right elbow and right forearm.  Denies dizziness, no nausea or vomiting.  No chest pain.  No back pain.  No abdominal pain ? ?  ? ? ?Physical Exam  ? ?Triage Vital Signs: ?ED Triage Vitals  ?Enc Vitals Group  ?   BP 07/19/21 0832 (!) 147/61  ?   Pulse Rate 07/19/21 0832 76  ?   Resp 07/19/21 0832 14  ?   Temp 07/19/21 0834 (!) 97.4 ?F (36.3 ?C)  ?   Temp Source 07/19/21 0834 Oral  ?   SpO2 07/19/21 0832 100 %  ?   Weight 07/19/21 0829 78.2 kg (172 lb 4.8 oz)  ?   Height 07/19/21 0829 1.626 m (5\' 4" )  ?   Head Circumference --   ?   Peak Flow --   ?   Pain Score 07/19/21 0828 8  ?   Pain Loc --   ?   Pain Edu? --   ?   Excl. in [place]? --   ? ? ?Most recent vital signs: ?Vitals:  ? 07/19/21 0832 07/19/21 0834  ?BP: (!) 147/61   ?Pulse: 76   ?Resp: 14   ?Temp:  (!) 97.4 ?F (36.3 ?C)  ?SpO2: 100%   ? ? ? ?General: Awake, no distress.  ?CV:  Good peripheral perfusion.  No chest wall tenderness palpation, no tenderness to clavicles ?Resp:  Normal effort.  ?Abd:  No distention.  Soft, no tenderness ?Other:  No vertebral has palpation, no pain with axial load on both hips.  Left upper extremity is normal. ? ?Normal range of motion of the right upper extremity however bruising noted to the elbow and proximal forearm ? ? ?ED Results / Procedures / Treatments  ? ?Labs ?(all labs ordered are listed, but only abnormal results are displayed) ?Labs Reviewed - No data to display ? ? ?EKG ? ? ? ? ?RADIOLOGY ? ?CT head viewed interpreted by me, no acute abnormality ? ? ?PROCEDURES: ? ?Critical Care performed:   ? ?Procedures ? ? ?MEDICATIONS ORDERED IN ED: ?Medications - No data to display ? ? ?IMPRESSION / MDM / ASSESSMENT AND PLAN / ED COURSE  ?I reviewed the triage vital signs and the nursing notes. ? ?Patient presents after reported mechanical fall.  Mild head injury, injury to the right arm. ? ?Overall well-appearing and in no acute distress.  Will obtain CT head cervical spine given age and risk factors. ? ?X-ray of the right elbow and forearm reviewed by me, patient with right ulnar diaphysis fracture. ? ?We will apply ulnar gutter splint, patient will require follow-up with Ortho.  Analgesic Rx ? ? ? ? ? ?  ? ? ?FINAL CLINICAL IMPRESSION(S) / ED DIAGNOSES  ? ?Final diagnoses:  ?Fall, initial encounter  ?Closed fracture of shaft of right ulna, unspecified fracture morphology, initial encounter  ? ? ? ?Rx / DC Orders  ? ?ED Discharge Orders   ? ?      Ordered  ?  oxyCODONE-acetaminophen (PERCOCET) 5-325 MG tablet  Every 8 hours PRN       ? 07/19/21 0932  ? ?  ?  ? ?  ? ? ? ?Note:  This document was prepared using Dragon voice recognition software and may include unintentional dictation errors. ?  ?Lavonia Drafts, MD ?07/19/21 0932 ? ?

## 2021-07-19 NOTE — ED Notes (Signed)
Pt's daughter called for ride.  ?

## 2021-07-19 NOTE — Discharge Instructions (Addendum)
Your xray shows a fracture of one of the bones in your arm (the ulna). We applied a splint for comfort and support. Please follow up with orthopedics for definitive treatment.  ?

## 2021-07-19 NOTE — ED Notes (Signed)
Pt sleeping in room with NAD noted. Attempted to call ride again, no answer. ?

## 2021-07-19 NOTE — ED Notes (Signed)
Pt resting comfortably at this time. NAD noted.  

## 2021-07-28 DIAGNOSIS — S5291XA Unspecified fracture of right forearm, initial encounter for closed fracture: Secondary | ICD-10-CM | POA: Diagnosis not present

## 2021-07-28 DIAGNOSIS — I779 Disorder of arteries and arterioles, unspecified: Secondary | ICD-10-CM | POA: Diagnosis not present

## 2021-07-28 DIAGNOSIS — E119 Type 2 diabetes mellitus without complications: Secondary | ICD-10-CM | POA: Diagnosis not present

## 2021-08-04 DIAGNOSIS — E118 Type 2 diabetes mellitus with unspecified complications: Secondary | ICD-10-CM | POA: Diagnosis not present

## 2021-08-04 DIAGNOSIS — S0083XA Contusion of other part of head, initial encounter: Secondary | ICD-10-CM | POA: Diagnosis not present

## 2021-08-04 DIAGNOSIS — E1342 Other specified diabetes mellitus with diabetic polyneuropathy: Secondary | ICD-10-CM | POA: Diagnosis not present

## 2021-08-04 DIAGNOSIS — I872 Venous insufficiency (chronic) (peripheral): Secondary | ICD-10-CM | POA: Diagnosis not present

## 2021-08-04 DIAGNOSIS — E785 Hyperlipidemia, unspecified: Secondary | ICD-10-CM | POA: Diagnosis not present

## 2021-08-04 DIAGNOSIS — G40309 Generalized idiopathic epilepsy and epileptic syndromes, not intractable, without status epilepticus: Secondary | ICD-10-CM | POA: Diagnosis not present

## 2021-08-04 DIAGNOSIS — G4733 Obstructive sleep apnea (adult) (pediatric): Secondary | ICD-10-CM | POA: Diagnosis not present

## 2021-08-04 DIAGNOSIS — F329 Major depressive disorder, single episode, unspecified: Secondary | ICD-10-CM | POA: Diagnosis not present

## 2021-08-04 DIAGNOSIS — I10 Essential (primary) hypertension: Secondary | ICD-10-CM | POA: Diagnosis not present

## 2021-08-05 ENCOUNTER — Inpatient Hospital Stay
Admission: EM | Admit: 2021-08-05 | Discharge: 2021-08-11 | DRG: 690 | Disposition: A | Discharge status: Home / Self Care | Payer: Medicare HMO | Attending: Internal Medicine | Admitting: Internal Medicine

## 2021-08-05 ENCOUNTER — Other Ambulatory Visit: Payer: Self-pay

## 2021-08-05 ENCOUNTER — Emergency Department: Payer: Medicare HMO

## 2021-08-05 ENCOUNTER — Encounter: Payer: Self-pay | Admitting: *Deleted

## 2021-08-05 DIAGNOSIS — I2510 Atherosclerotic heart disease of native coronary artery without angina pectoris: Secondary | ICD-10-CM | POA: Diagnosis not present

## 2021-08-05 DIAGNOSIS — Z955 Presence of coronary angioplasty implant and graft: Secondary | ICD-10-CM

## 2021-08-05 DIAGNOSIS — B9620 Unspecified Escherichia coli [E. coli] as the cause of diseases classified elsewhere: Secondary | ICD-10-CM | POA: Diagnosis present

## 2021-08-05 DIAGNOSIS — Z6829 Body mass index (BMI) 29.0-29.9, adult: Secondary | ICD-10-CM

## 2021-08-05 DIAGNOSIS — E785 Hyperlipidemia, unspecified: Secondary | ICD-10-CM | POA: Diagnosis not present

## 2021-08-05 DIAGNOSIS — J9811 Atelectasis: Secondary | ICD-10-CM | POA: Diagnosis not present

## 2021-08-05 DIAGNOSIS — Z7401 Bed confinement status: Secondary | ICD-10-CM | POA: Diagnosis not present

## 2021-08-05 DIAGNOSIS — I10 Essential (primary) hypertension: Secondary | ICD-10-CM | POA: Diagnosis present

## 2021-08-05 DIAGNOSIS — R531 Weakness: Secondary | ICD-10-CM | POA: Diagnosis not present

## 2021-08-05 DIAGNOSIS — R000 Tachycardia, unspecified: Secondary | ICD-10-CM | POA: Diagnosis not present

## 2021-08-05 DIAGNOSIS — N3090 Cystitis, unspecified without hematuria: Secondary | ICD-10-CM | POA: Diagnosis not present

## 2021-08-05 DIAGNOSIS — I252 Old myocardial infarction: Secondary | ICD-10-CM | POA: Diagnosis not present

## 2021-08-05 DIAGNOSIS — N319 Neuromuscular dysfunction of bladder, unspecified: Secondary | ICD-10-CM | POA: Diagnosis present

## 2021-08-05 DIAGNOSIS — E871 Hypo-osmolality and hyponatremia: Secondary | ICD-10-CM | POA: Diagnosis not present

## 2021-08-05 DIAGNOSIS — Z20822 Contact with and (suspected) exposure to covid-19: Secondary | ICD-10-CM | POA: Diagnosis not present

## 2021-08-05 DIAGNOSIS — W19XXXA Unspecified fall, initial encounter: Secondary | ICD-10-CM | POA: Diagnosis not present

## 2021-08-05 DIAGNOSIS — Z79899 Other long term (current) drug therapy: Secondary | ICD-10-CM

## 2021-08-05 DIAGNOSIS — Z7902 Long term (current) use of antithrombotics/antiplatelets: Secondary | ICD-10-CM

## 2021-08-05 DIAGNOSIS — E119 Type 2 diabetes mellitus without complications: Secondary | ICD-10-CM | POA: Diagnosis not present

## 2021-08-05 DIAGNOSIS — Z8249 Family history of ischemic heart disease and other diseases of the circulatory system: Secondary | ICD-10-CM | POA: Diagnosis not present

## 2021-08-05 DIAGNOSIS — N390 Urinary tract infection, site not specified: Secondary | ICD-10-CM | POA: Diagnosis not present

## 2021-08-05 DIAGNOSIS — Z951 Presence of aortocoronary bypass graft: Secondary | ICD-10-CM | POA: Diagnosis not present

## 2021-08-05 DIAGNOSIS — S5291XS Unspecified fracture of right forearm, sequela: Secondary | ICD-10-CM | POA: Diagnosis not present

## 2021-08-05 DIAGNOSIS — Z7982 Long term (current) use of aspirin: Secondary | ICD-10-CM

## 2021-08-05 DIAGNOSIS — G4733 Obstructive sleep apnea (adult) (pediatric): Secondary | ICD-10-CM | POA: Diagnosis present

## 2021-08-05 DIAGNOSIS — G40909 Epilepsy, unspecified, not intractable, without status epilepticus: Secondary | ICD-10-CM | POA: Diagnosis present

## 2021-08-05 DIAGNOSIS — R5381 Other malaise: Secondary | ICD-10-CM | POA: Diagnosis not present

## 2021-08-05 DIAGNOSIS — Z7984 Long term (current) use of oral hypoglycemic drugs: Secondary | ICD-10-CM | POA: Diagnosis not present

## 2021-08-05 DIAGNOSIS — R627 Adult failure to thrive: Secondary | ICD-10-CM | POA: Diagnosis present

## 2021-08-05 LAB — BASIC METABOLIC PANEL
Anion gap: 7 (ref 5–15)
BUN: 15 mg/dL (ref 8–23)
CO2: 26 mmol/L (ref 22–32)
Calcium: 8.9 mg/dL (ref 8.9–10.3)
Chloride: 98 mmol/L (ref 98–111)
Creatinine, Ser: 0.47 mg/dL — ABNORMAL LOW (ref 0.61–1.24)
GFR, Estimated: >60 mL/min (ref >60)
Glucose, Bld: 177 mg/dL — ABNORMAL HIGH (ref 70–99)
Potassium: 4.3 mmol/L (ref 3.5–5.1)
Sodium: 131 mmol/L — ABNORMAL LOW (ref 135–145)

## 2021-08-05 LAB — URINALYSIS, ROUTINE W REFLEX MICROSCOPIC
Bilirubin Urine: NEGATIVE
Glucose, UA: NEGATIVE mg/dL
Ketones, ur: 5 mg/dL — AB
Nitrite: NEGATIVE
Protein, ur: NEGATIVE mg/dL
Specific Gravity, Urine: 1.014 (ref 1.005–1.030)
pH: 6 (ref 5.0–8.0)

## 2021-08-05 LAB — CBC
HCT: 35.3 % — ABNORMAL LOW (ref 39.0–52.0)
Hemoglobin: 11.7 g/dL — ABNORMAL LOW (ref 13.0–17.0)
MCH: 31 pg (ref 26.0–34.0)
MCHC: 33.1 g/dL (ref 30.0–36.0)
MCV: 93.4 fL (ref 80.0–100.0)
Platelets: 248 10*3/uL (ref 150–400)
RBC: 3.78 MIL/uL — ABNORMAL LOW (ref 4.22–5.81)
RDW: 13.8 % (ref 11.5–15.5)
WBC: 9.1 10*3/uL (ref 4.0–10.5)
nRBC: 0 % (ref 0.0–0.2)

## 2021-08-05 LAB — TROPONIN I (HIGH SENSITIVITY)
Troponin I (High Sensitivity): 4 ng/L (ref <18)
Troponin I (High Sensitivity): 6 ng/L (ref <18)

## 2021-08-05 MED ORDER — CEFTRIAXONE SODIUM 1 G IJ SOLR
1.0000 g | INTRAMUSCULAR | Status: AC
  Administered 2021-08-06: 1 g via INTRAVENOUS
  Filled 2021-08-05: qty 10

## 2021-08-05 NOTE — ED Triage Notes (Addendum)
Pt brought in via wheelchair.  Pt has a cast on right arm.  Daughter reports recent falls.   Daughter states pt can not take care of himself.  Pmd told family to bring pt here for rehab and placement.  Pt alert.  Pt denies SI or HI  pt denies etoh use or drug use.  Pt calm and cooperative.  ?

## 2021-08-05 NOTE — ED Provider Notes (Signed)
? ?[facility] ?Provider Note ? ? ? Event Date/Time  ? First MD Initiated Contact with Patient 08/05/21 2307   ?  (approximate) ? ? ?History  ? ?Fall and Weakness ? ? ?HPI ?Level 5 caveat:  history/ROS limited by acute/critical illness ? ? ?Edward Thompson is a 71 y.o. male with history that includes coronary artery disease, complicated urinary tract infection, neurogenic bladder with self-catheterization, history of seizures, and general failure to thrive over about the last year.  He presents tonight with his daughter for evaluation of multiple frequent falls, as well as overall worsening status.  He had a fall several weeks ago that resulted in a fracture to his right arm and he still has a cast in place.  However his daughter said he continues to get worse since that time.  She and the family are concerned that he may have some underlying disease, such as a neurodegenerative disease, that has not been previously identified.  He has had a substantial change within the last year but continues to worsen at a consistent pace.  He is unable to care for himself and they can no longer care for him.  This is particular issue over the last few days as he has been getting worse.  He still seems to mostly understand what is going on and is talkative with his family but he is sleeping more than usual.  He has been self cathing less than usual although he still does so, but he is most recently unable to perform the actions himself.  She reports that they went to his PCP yesterday who encouraged them to bring him to the emergency department for additional care. ? ?No recent fever of which the daughter is aware.  No difficulty breathing, no report of pain. ? ?The patient is asleep but awakens with encouragement.  He denies pain and shortness of breath.  He cannot provide any additional history except to confirm that he feels weak. ?  ? ? ?Physical Exam  ? ?Triage Vital Signs: ?ED Triage Vitals  ?Enc Vitals  Group  ?   BP 08/05/21 2042 (!) 141/76  ?   Pulse Rate 08/05/21 2042 85  ?   Resp 08/05/21 2042 18  ?   Temp 08/05/21 2042 98.4 ?F (36.9 ?C)  ?   Temp Source 08/05/21 2042 Oral  ?   SpO2 08/05/21 2042 96 %  ?   Weight 08/05/21 2038 77.1 kg (170 lb)  ?   Height 08/05/21 2038 1.626 m (5\' 4" )  ?   Head Circumference --   ?   Peak Flow --   ?   Pain Score 08/05/21 2038 0  ?   Pain Loc --   ?   Pain Edu? --   ?   Excl. in [place]? --   ? ? ?Most recent vital signs: ?Vitals:  ? 08/06/21 0630 08/06/21 0700  ?BP: 115/63 93/65  ?Pulse: 68 64  ?Resp: 14 18  ?Temp:    ?SpO2: 97% 95%  ? ? ? ?General: Appears chronically ill and older than his chronological age.  No evidence of trauma other than the cast on his right arm. ?CV:  Good peripheral perfusion.  Normal heart sounds.  Dry mucous membranes. ?Resp:  Normal effort.  Lungs are clear to auscultation bilaterally. ?Abd:  No distention.  No tenderness to palpation. ?Other:  Patient has a degree of wasting throughout.  Unable to participate in neurological exam but no obvious CVA because  he does move his extremities spontaneously.  He has repeated behaviors of sticking out his tongue and almost chewing on it, but not exactly a oral dyskinesia or tar dive dyskinesia. ? ? ?ED Results / Procedures / Treatments  ? ?Labs ?(all labs ordered are listed, but only abnormal results are displayed) ?Labs Reviewed  ?BASIC METABOLIC PANEL - Abnormal; Notable for the following components:  ?    Result Value  ? Sodium 131 (*)   ? Glucose, Bld 177 (*)   ? Creatinine, Ser 0.47 (*)   ? All other components within normal limits  ?CBC - Abnormal; Notable for the following components:  ? RBC 3.78 (*)   ? Hemoglobin 11.7 (*)   ? HCT 35.3 (*)   ? All other components within normal limits  ?URINALYSIS, ROUTINE W REFLEX MICROSCOPIC - Abnormal; Notable for the following components:  ? Color, Urine YELLOW (*)   ? APPearance HAZY (*)   ? Hgb urine dipstick SMALL (*)   ? Ketones, ur 5 (*)   ? Leukocytes,Ua  MODERATE (*)   ? Bacteria, UA MANY (*)   ? All other components within normal limits  ?RESP PANEL BY RT-PCR (FLU A&B, COVID) [id number]  ?URINE CULTURE  ?HEMOGLOBIN A1C  ?BASIC METABOLIC PANEL  ?CBG MONITORING, ED  ?CBG MONITORING, ED  ?TROPONIN I (HIGH SENSITIVITY)  ?TROPONIN I (HIGH SENSITIVITY)  ? ? ? ?EKG ? ?ED ECG REPORT ?ILoleta Zilberman, the attending physician, personally viewed and interpreted this ECG. ? ?Date: 08/05/2021 ?EKG Time: 20: 53 ?Rate: 84 ?Rhythm: normal sinus rhythm ?QRS Axis: normal ?Intervals: normal ?ST/T Wave abnormalities: normal ?Narrative Interpretation: no evidence of acute ischemia ? ? ? ?RADIOLOGY ?I personally reviewed the patient's 1 view chest x-ray.  No pathology to explain patient's symptoms.  Radiology report agrees. ? ? ? ? ?PROCEDURES: ? ?Critical Care performed: No ? ?.1-3 Lead EKG Interpretation ?Performed by: Loleta Rollinson, MD ?Authorized by: Loleta Robson, MD  ? ?  Interpretation: normal   ?  ECG rate:  65 ?  ECG rate assessment: normal   ?  Rhythm: sinus rhythm   ?  Ectopy: none   ?  Conduction: normal   ? ? ?MEDICATIONS ORDERED IN ED: ?Medications  ?cefTRIAXone (ROCEPHIN) 1 g in sodium chloride 0.9 % 100 mL IVPB (has no administration in time range)  ?phenytoin (DILANTIN) ER capsule 300 mg (300 mg Oral Given 08/06/21 0222)  ?PHENobarbital (LUMINAL) tablet 129.6 mg (has no administration in time range)  ?aspirin EC tablet 81 mg (81 mg Oral Given 08/06/21 0223)  ?escitalopram (LEXAPRO) tablet 10 mg (has no administration in time range)  ?enoxaparin (LOVENOX) injection 40 mg (has no administration in time range)  ?acetaminophen (TYLENOL) tablet 650 mg (650 mg Oral Given 08/06/21 0355)  ?  Or  ?acetaminophen (TYLENOL) suppository 650 mg ( Rectal See Alternative 08/06/21 0355)  ?ondansetron (ZOFRAN) tablet 4 mg (has no administration in time range)  ?  Or  ?ondansetron (ZOFRAN) injection 4 mg (has no administration in time range)  ?insulin aspart (novoLOG) injection 0-15 Units (0  Units Subcutaneous Not Given 08/06/21 0833)  ?insulin aspart (novoLOG) injection 0-5 Units (0 Units Subcutaneous Not Given 08/06/21 0222)  ?sodium chloride 0.9 % bolus 500 mL (0 mLs Intravenous Stopped 08/06/21 0900)  ?cefTRIAXone (ROCEPHIN) 1 g in sodium chloride 0.9 % 100 mL IVPB (0 g Intravenous Stopped 08/06/21 0147)  ? ? ? ?IMPRESSION / MDM / ASSESSMENT AND PLAN / ED COURSE  ?I reviewed the triage  vital signs and the nursing notes. ?             ?               ? ?Differential diagnosis includes, but is not limited to, nonspecific failure to thrive, neurodegenerative illness, neoplasm, acute infectious process such as UTI or pneumonia, renal dysfunction. ? ?The patient is on the cardiac monitor to evaluate for evidence of arrhythmia and/or significant heart rate changes. ? ?Patient's vital signs are stable and within normal limits although his blood pressure is borderline hypotensive.  He appears volume depleted.  I reviewed his EKG which shows no sign of ischemia.  Reviewed his chest x-ray which shows no sign of acute infection.  Labs ordered initially include respiratory viral panel, urinalysis, high-sensitivity troponin, basic metabolic panel, and CBC.  I reviewed the results that his CBC is essentially normal, basic metabolic panel is essentially normal other than mild hyponatremia, high-sensitivity troponin is within normal limits, but his urinalysis appears grossly positive with many bacteria and numerous WBCs and moderate leukocytes.  I believe this is most likely UTI from his self-catheterization. ? ?Given his acute on chronic decompensation of no clear specific diagnosis, he needs treatment and care beyond what can be provided at home for his urinary tract infection and I ordered ceftriaxone 1 g IV and a urine culture.  However there is also concern he may be suffering from an underlying neurological condition that would benefit from additional evaluation and treatment.  That is not going to happen in the  emergency department tonight but he would benefit from dietary consultation with neurology and possible additional imaging.  I talked with the daughter about this and she agrees with the plan.  I then consulte

## 2021-08-06 DIAGNOSIS — Z7984 Long term (current) use of oral hypoglycemic drugs: Secondary | ICD-10-CM | POA: Diagnosis not present

## 2021-08-06 DIAGNOSIS — N319 Neuromuscular dysfunction of bladder, unspecified: Secondary | ICD-10-CM | POA: Diagnosis present

## 2021-08-06 DIAGNOSIS — B9620 Unspecified Escherichia coli [E. coli] as the cause of diseases classified elsewhere: Secondary | ICD-10-CM | POA: Diagnosis present

## 2021-08-06 DIAGNOSIS — I10 Essential (primary) hypertension: Secondary | ICD-10-CM | POA: Diagnosis present

## 2021-08-06 DIAGNOSIS — Z79899 Other long term (current) drug therapy: Secondary | ICD-10-CM | POA: Diagnosis not present

## 2021-08-06 DIAGNOSIS — R627 Adult failure to thrive: Secondary | ICD-10-CM | POA: Diagnosis present

## 2021-08-06 DIAGNOSIS — Z6829 Body mass index (BMI) 29.0-29.9, adult: Secondary | ICD-10-CM | POA: Diagnosis not present

## 2021-08-06 DIAGNOSIS — N390 Urinary tract infection, site not specified: Secondary | ICD-10-CM | POA: Diagnosis present

## 2021-08-06 DIAGNOSIS — I252 Old myocardial infarction: Secondary | ICD-10-CM | POA: Diagnosis not present

## 2021-08-06 DIAGNOSIS — E119 Type 2 diabetes mellitus without complications: Secondary | ICD-10-CM | POA: Diagnosis present

## 2021-08-06 DIAGNOSIS — Z20822 Contact with and (suspected) exposure to covid-19: Secondary | ICD-10-CM | POA: Diagnosis present

## 2021-08-06 DIAGNOSIS — E785 Hyperlipidemia, unspecified: Secondary | ICD-10-CM | POA: Diagnosis present

## 2021-08-06 DIAGNOSIS — Z951 Presence of aortocoronary bypass graft: Secondary | ICD-10-CM | POA: Diagnosis not present

## 2021-08-06 DIAGNOSIS — Z7902 Long term (current) use of antithrombotics/antiplatelets: Secondary | ICD-10-CM | POA: Diagnosis not present

## 2021-08-06 DIAGNOSIS — G4733 Obstructive sleep apnea (adult) (pediatric): Secondary | ICD-10-CM | POA: Diagnosis present

## 2021-08-06 DIAGNOSIS — I2510 Atherosclerotic heart disease of native coronary artery without angina pectoris: Secondary | ICD-10-CM | POA: Diagnosis present

## 2021-08-06 DIAGNOSIS — E871 Hypo-osmolality and hyponatremia: Secondary | ICD-10-CM | POA: Diagnosis present

## 2021-08-06 DIAGNOSIS — G40909 Epilepsy, unspecified, not intractable, without status epilepticus: Secondary | ICD-10-CM | POA: Diagnosis present

## 2021-08-06 DIAGNOSIS — Z8249 Family history of ischemic heart disease and other diseases of the circulatory system: Secondary | ICD-10-CM | POA: Diagnosis not present

## 2021-08-06 DIAGNOSIS — Z7982 Long term (current) use of aspirin: Secondary | ICD-10-CM | POA: Diagnosis not present

## 2021-08-06 DIAGNOSIS — Z955 Presence of coronary angioplasty implant and graft: Secondary | ICD-10-CM | POA: Diagnosis not present

## 2021-08-06 LAB — HEMOGLOBIN A1C
Hgb A1c MFr Bld: 5.1 % (ref 4.8–5.6)
Mean Plasma Glucose: 99.67 mg/dL

## 2021-08-06 LAB — CBG MONITORING, ED
Glucose-Capillary: 90 mg/dL (ref 70–99)
Glucose-Capillary: 92 mg/dL (ref 70–99)
Glucose-Capillary: 123 mg/dL — ABNORMAL HIGH (ref 70–99)

## 2021-08-06 LAB — BASIC METABOLIC PANEL
Anion gap: 7 (ref 5–15)
BUN: 13 mg/dL (ref 8–23)
CO2: 26 mmol/L (ref 22–32)
Calcium: 8.4 mg/dL — ABNORMAL LOW (ref 8.9–10.3)
Chloride: 98 mmol/L (ref 98–111)
Creatinine, Ser: 0.47 mg/dL — ABNORMAL LOW (ref 0.61–1.24)
GFR, Estimated: >60 mL/min (ref >60)
Glucose, Bld: 148 mg/dL — ABNORMAL HIGH (ref 70–99)
Potassium: 3.7 mmol/L (ref 3.5–5.1)
Sodium: 131 mmol/L — ABNORMAL LOW (ref 135–145)

## 2021-08-06 LAB — GLUCOSE, CAPILLARY
Glucose-Capillary: 156 mg/dL — ABNORMAL HIGH (ref 70–99)
Glucose-Capillary: 91 mg/dL (ref 70–99)

## 2021-08-06 LAB — RESP PANEL BY RT-PCR (FLU A&B, COVID) ARPGX2
Influenza A by PCR: NEGATIVE
Influenza B by PCR: NEGATIVE
SARS Coronavirus 2 by RT PCR: NEGATIVE

## 2021-08-06 MED ORDER — INSULIN ASPART 100 UNIT/ML IJ SOLN
0.0000 [IU] | Freq: Three times a day (TID) | INTRAMUSCULAR | Status: DC
  Administered 2021-08-06: 1 [IU] via SUBCUTANEOUS
  Administered 2021-08-06: 3 [IU] via SUBCUTANEOUS
  Administered 2021-08-08: 2 [IU] via SUBCUTANEOUS
  Administered 2021-08-09: 3 [IU] via SUBCUTANEOUS
  Administered 2021-08-10: 2 [IU] via SUBCUTANEOUS
  Filled 2021-08-06 (×6): qty 1

## 2021-08-06 MED ORDER — METOPROLOL SUCCINATE ER 25 MG PO TB24
12.5000 mg | ORAL_TABLET | Freq: Every day | ORAL | Status: DC
  Filled 2021-08-06: qty 0.5

## 2021-08-06 MED ORDER — NITROGLYCERIN 0.4 MG SL SUBL: 0.4000 mg | SUBLINGUAL_TABLET | SUBLINGUAL | Status: DC | PRN

## 2021-08-06 MED ORDER — ENOXAPARIN SODIUM 40 MG/0.4ML IJ SOSY
40.0000 mg | PREFILLED_SYRINGE | INTRAMUSCULAR | Status: DC
  Administered 2021-08-06 – 2021-08-11 (×6): 40 mg via SUBCUTANEOUS
  Filled 2021-08-06 (×6): qty 0.4

## 2021-08-06 MED ORDER — ACETAMINOPHEN 650 MG RE SUPP
650.0000 mg | Freq: Four times a day (QID) | RECTAL | Status: DC | PRN
Start: 2021-08-06 — End: 2021-08-11

## 2021-08-06 MED ORDER — PHENYTOIN SODIUM EXTENDED 100 MG PO CAPS
300.0000 mg | ORAL_CAPSULE | Freq: Every day | ORAL | Status: DC
  Administered 2021-08-06 – 2021-08-10 (×6): 300 mg via ORAL
  Filled 2021-08-06 (×6): qty 3

## 2021-08-06 MED ORDER — SODIUM CHLORIDE 0.9 % IV SOLN
1.0000 g | INTRAVENOUS | Status: DC
  Administered 2021-08-08: 1 g via INTRAVENOUS
  Filled 2021-08-06 (×2): qty 10

## 2021-08-06 MED ORDER — SODIUM CHLORIDE 0.9 % IV BOLUS
500.0000 mL | Freq: Once | INTRAVENOUS | Status: AC
  Administered 2021-08-06: 500 mL via INTRAVENOUS

## 2021-08-06 MED ORDER — ONDANSETRON HCL 4 MG PO TABS: 4.0000 mg | ORAL_TABLET | Freq: Four times a day (QID) | ORAL | Status: DC | PRN

## 2021-08-06 MED ORDER — ASPIRIN EC 81 MG PO TBEC
81.0000 mg | DELAYED_RELEASE_TABLET | Freq: Every day | ORAL | Status: DC
Start: 2021-08-06 — End: 2021-08-11
  Administered 2021-08-06 – 2021-08-10 (×6): 81 mg via ORAL
  Filled 2021-08-06 (×6): qty 1

## 2021-08-06 MED ORDER — ONDANSETRON HCL 4 MG/2ML IJ SOLN: 4.0000 mg | Freq: Four times a day (QID) | INTRAMUSCULAR | Status: DC | PRN

## 2021-08-06 MED ORDER — ESCITALOPRAM OXALATE 10 MG PO TABS
10.0000 mg | ORAL_TABLET | Freq: Every day | ORAL | Status: DC
  Administered 2021-08-06 – 2021-08-11 (×6): 10 mg via ORAL
  Filled 2021-08-06 (×6): qty 1

## 2021-08-06 MED ORDER — INSULIN ASPART 100 UNIT/ML IJ SOLN: 0.0000 [IU] | Freq: Every day | INTRAMUSCULAR | Status: DC

## 2021-08-06 MED ORDER — ACETAMINOPHEN 325 MG PO TABS
650.0000 mg | ORAL_TABLET | Freq: Four times a day (QID) | ORAL | Status: DC | PRN
  Administered 2021-08-06 (×2): 650 mg via ORAL
  Filled 2021-08-06 (×2): qty 2

## 2021-08-06 MED ORDER — PHENOBARBITAL 32.4 MG PO TABS
129.6000 mg | ORAL_TABLET | Freq: Every day | ORAL | Status: DC
  Administered 2021-08-06 – 2021-08-10 (×5): 129.6 mg via ORAL
  Filled 2021-08-06 (×5): qty 4

## 2021-08-06 NOTE — Evaluation (Signed)
Occupational Therapy Evaluation ?Patient Details ?Name: Edward Thompson ?MRN: [id number] ?DOB: [birth year] ?Today's Date: 08/06/2021 ? ? ?History of Present Illness 71 y.o. male with medical history significant for CAD s/p stent, seizure disorder, HTN, sleep apnea, chronic urinary retention secondary to neurogenic bladder who self catheterizes, carotid artery stenosis s/p carotid endarterectomy who had a recent fall, sustaining fracture of the right arm who was brought the ED due to concerns that patient is unable to care for self.  Patient has been reportedly declining of late which led to his recent fall and has been very weak and has been having difficulty self catheterizing and taking care of himself.  He has had no cough or shortness of breath, vomiting, diarrhea, abdominal plain.  He has decreased appetite.  ? ?Clinical Impression ?  ?Patient presenting with decreased Ind in self care, balance, functional mobility/transfers, endurance, and safety awareness. Patient reports living at home with son, DIL, and 5 grandchildren. No family present to confirm baseline. Pt reports he no longer ambulates at home and he stands and transfers to wheelchair and moves himself around in home seated. Son drives a trunk during the week and is not at home. He was unable to confirm DIL assist during the day. Pt currently has F UE fx with ulnar gutter cast on and assumed NWB. Pt is unable to perform self care tasks and self cath at home with him reporting no one has been helping him since fx and he has not had a bath in ~ 2 weeks. He performs bed mobility and standing with min guard - min A without use of AD. OT recommending home with HHOT if family is able to assist him and if they are unable he will need SNF at hospital discharge to address occupational performance and safety.  Patient will benefit from acute OT to increase overall independence in the areas of ADLs, functional mobility,and safety awareness in order to safely  discharge home with family. ?   ? ?Recommendations for follow up therapy are one component of a multi-disciplinary discharge planning process, led by the attending physician.  Recommendations may be updated based on patient status, additional functional criteria and insurance authorization.  ? ?Follow Up Recommendations ? Home health OT  ?  ?Assistance Recommended at Discharge Frequent or constant Supervision/Assistance  ?Patient can return home with the following A little help with walking and/or transfers;A little help with bathing/dressing/bathroom;Help with stairs or ramp for entrance;Assist for transportation;Assistance with cooking/housework ? ?  ?Functional Status Assessment ? Patient has had a recent decline in their functional status and demonstrates the ability to make significant improvements in function in a reasonable and predictable amount of time.  ?Equipment Recommendations ? None recommended by OT  ?  ?   ?Precautions / Restrictions Precautions ?Precautions: Fall  ? ?  ? ?Mobility Bed Mobility ?Overal bed mobility: Needs Assistance ?Bed Mobility: Supine to Sit, Sit to Supine ?  ?  ?Supine to sit: Min assist ?Sit to supine: Min assist ?  ?General bed mobility comments: from high ED stretcher ?  ? ?Transfers ?Overall transfer level: Needs assistance ?Equipment used: 1 person hand held assist ?Transfers: Sit to/from Stand ?Sit to Stand: Min assist ?  ?  ?  ?  ?  ?General transfer comment: posterior bias in standing. ?  ? ?  ?Balance Overall balance assessment: Needs assistance ?Sitting-balance support: Single extremity supported ?Sitting balance-Leahy Scale: Good ?  ?  ?Standing balance support: Single extremity supported ?Standing balance-Leahy Scale: Fair ?  ?  ?  ?  ?  ?  ?  ?  ?  ?  ?  ?  ?   ? ?  ADL either performed or assessed with clinical judgement  ? ?ADL Overall ADL's : Needs assistance/impaired ?  ?  ?  ?  ?  ?  ?  ?  ?  ?  ?  ?  ?  ?  ?  ?  ?  ?  ?  ?General ADL Comments: mod - max A overall  with increased assist since splint is present.  ? ? ? ?Vision Baseline Vision/History: 1 Wears glasses ?Patient Visual Report: No change from baseline ?   ?   ?   ?   ? ?Pertinent Vitals/Pain Pain Assessment ?Pain Assessment: No/denies pain  ? ? ? ?Hand Dominance Right ?  ?Extremity/Trunk Assessment Upper Extremity Assessment ?Upper Extremity Assessment: RUE deficits/detail ?RUE Deficits / Details: in ulnar gutter splint and assuming NWB ?  ?Lower Extremity Assessment ?Lower Extremity Assessment:  (B foot drop at baseline) ?  ?  ?  ?Communication Communication ?Communication: No difficulties ?  ?Cognition Arousal/Alertness: Awake/alert ?Behavior During Therapy: [facility] for tasks assessed/performed ?Overall Cognitive Status: Within Functional Limits for tasks assessed ?  ?  ?  ?  ?  ?  ?  ?  ?  ?  ?  ?  ?  ?  ?  ?  ?General Comments: keeps eyes closed some during session but continues to engage and answer questions and perform tasks as asked. A &O x4 ?  ?  ?   ?   ?   ? ? ?Home Living Family/patient expects to be discharged to:: Private residence ?Living Arrangements: Children ?Available Help at Discharge: Family;Available PRN/intermittently ?Type of Home: House ?Home Access: Ramped entrance ?  ?  ?Home Layout: One level ?  ?  ?Bathroom Shower/Tub: Tub/shower unit ?  ?Bathroom Toilet: Standard ?  ?  ?Home Equipment: Agricultural consultant (2 wheels);Wheelchair - manual ?  ?  ?  ? ?  ?Prior Functioning/Environment Prior Level of Function : Needs assist;History of Falls (last six months) ?  ?  ?  ?  ?  ?  ?Mobility Comments: Pt reports he no longer ambulates in home with RW and only transfers into wheelchair. Multiple falls "too many to count" ?ADLs Comments: Pt reports he was doing these things independently but has not taken a bath since last admission with UE fx ( ~ 2 weeks ago) ?  ? ?  ?  ?OT Problem List: Decreased strength;Decreased coordination;Decreased activity tolerance;Decreased safety awareness;Impaired balance  (sitting and/or standing);Decreased knowledge of use of DME or AE;Decreased knowledge of precautions;Impaired UE functional use ?  ?   ?OT Treatment/Interventions: Self-care/ADL training;Therapeutic exercise;Therapeutic activities;Energy conservation;DME and/or AE instruction;Patient/family education;Manual therapy;Balance training  ?  ?OT Goals(Current goals can be found in the care plan section) Acute Rehab OT Goals ?Patient Stated Goal: to get stronger ?OT Goal Formulation: With patient ?Time For Goal Achievement: 08/20/21 ?Potential to Achieve Goals: Fair ?ADL Goals ?Pt Will Perform Grooming: with modified independence;sitting ?Pt Will Perform Lower Body Dressing: with min assist;sit to/from stand ?Pt Will Transfer to Toilet: with min guard assist;ambulating;stand pivot transfer  ?OT Frequency: Min 2X/week ?  ? ?   ?AM-PAC OT "6 Clicks" Daily Activity     ?Outcome Measure Help from another person eating meals?: A Little ?Help from another person taking care of personal grooming?: A Little ?Help from another person toileting, which includes using toliet, bedpan, or urinal?: A Lot ?Help from another person bathing (including washing, rinsing, drying)?: A Lot ?Help from another person to put on and taking off regular  upper body clothing?: A Little ?Help from another person to put on and taking off regular lower body clothing?: A Lot ?6 Click Score: 15 ?  ?End of Session Nurse Communication: Mobility status;Other (comment) (bladder scan as pt is self cath at home) ? ?Activity Tolerance: Patient tolerated treatment well;Patient limited by fatigue ?Patient left: in bed;with call bell/phone within reach;with bed alarm set ? ?OT Visit Diagnosis: Unsteadiness on feet (R26.81);Repeated falls (R29.6);Muscle weakness (generalized) (M62.81);History of falling (Z91.81)  ?              ?Time: 4193-7902 ?OT Time Calculation (min): 22 min ?Charges:  OT General Charges ?$OT Visit: 1 Visit ?OT Evaluation ?$OT Eval Moderate  Complexity: 1 Mod ?OT Treatments ?$Therapeutic Activity: 8-22 mins ?Jackquline Denmark, MS, OTR/L , CBIS ?ascom [phone]  ?08/06/21, 3:17 PM  ?

## 2021-08-06 NOTE — Assessment & Plan Note (Signed)
Continue phenobarbital and phenytoin ?

## 2021-08-06 NOTE — Assessment & Plan Note (Signed)
No complaints of chest pain, troponin negative and EKG nonacute ?Continue metoprolol and aspirin with nitroglycerin sublingual as needed chest pain.  Med rec reveals patient not taking Crestor or any other statin ?

## 2021-08-06 NOTE — Evaluation (Signed)
Physical Therapy Evaluation ?Patient Details ?Name: Edward Thompson ?MRN: [id number] ?DOB: [birth year] ?Today's Date: 08/06/2021 ? ?History of Present Illness ? 71 y.o. male with medical history significant for CAD s/p stent, seizure disorder, HTN, sleep apnea, chronic urinary retention secondary to neurogenic bladder who self catheterizes, carotid artery stenosis s/p carotid endarterectomy who had a recent fall, sustaining fracture of the right arm who was brought the ED due to concerns that patient is unable to care for self.  Patient has been reportedly declining of late which led to his recent fall and has been very weak and has been having difficulty self catheterizing and taking care of himself.  He has had no cough or shortness of breath, vomiting, diarrhea, abdominal plain.  He has decreased appetite.  ?Clinical Impression ? Pt is a pleasant 71 year old male who was admitted for failure to thrive. Pt performs bed mobility/transfers with cga. Doesn't ambulate at baseline. Pt able to perform SPT to [facility] and self propel down hallway. Pt reports he has family assist with going up ramp and transportation. B foot drop present, however pt doesn't use AFOs. Pt demonstrates deficits with balance/mobility. Pt is very high falls risk and reports he has had multiple falls due to transfers unsupervised. L UE currently in cast. Pt needs 24/7 assist for safety, however is currently at baseline level. Doesn't show skilled need for SNF level of care. Would benefit from skilled PT to address above deficits and promote optimal return to PLOF. Recommend transition to HHPT upon discharge from acute hospitalization. Reports he doesn't want HH to come due to dogs and he doesn't feel he will benefit from PT. If family are unable to provide supervision and support at home, recommend LTC facility. ?  ?   ? ?Recommendations for follow up therapy are one component of a multi-disciplinary discharge planning process, led by the attending  physician.  Recommendations may be updated based on patient status, additional functional criteria and insurance authorization. ? ?Follow Up Recommendations Home health PT (Pt currently decling follow up PT) ? ?  ?Assistance Recommended at Discharge Frequent or constant Supervision/Assistance  ?Patient can return home with the following ? A little help with walking and/or transfers;A lot of help with bathing/dressing/bathroom;Assistance with cooking/housework;Assist for transportation;Help with stairs or ramp for entrance ? ?  ?Equipment Recommendations  (grab bars at home)  ?Recommendations for Other Services ?    ?  ?Functional Status Assessment Patient has had a recent decline in their functional status and/or demonstrates limited ability to make significant improvements in function in a reasonable and predictable amount of time  ? ?  ?Precautions / Restrictions Precautions ?Precautions: Fall ?Restrictions ?Weight Bearing Restrictions: No  ? ?  ? ?Mobility ? Bed Mobility ?Overal bed mobility: Needs Assistance ?Bed Mobility: Supine to Sit, Sit to Supine ?  ?  ?Supine to sit: Min guard ?Sit to supine: Min guard ?  ?General bed mobility comments: able to initiate mobility. Once seated, upright posture noted. ?  ? ?Transfers ?Overall transfer level: Needs assistance ?Equipment used: 1 person hand held assist ?Transfers: Bed to chair/wheelchair/BSC ?Sit to Stand: Min guard ?Stand pivot transfers: Min guard ?  ?  ?  ?  ?General transfer comment: able to SPT to own WC. Needs assist to lock brakes prior to transfer. HHA given ?  ? ?Ambulation/Gait ?  ?  ?  ?  ?  ?  ?  ?General Gait Details: doesn't ambulate at baseline ? ?Stairs ?  ?  ?  ?  ?  ? ?  Wheelchair Mobility ?Wheelchair Mobility ?Wheelchair mobility: Yes ?Wheelchair propulsion: Both lower extermities ?Wheelchair parts: Needs assistance ?Distance: 100 ?Wheelchair Assistance Details (indicate cue type and reason): needs assist for locking brakes due to L UE cast.  Able to complete turns safely. ? ?Modified Rankin (Stroke Patients Only) ?  ? ?  ? ?Balance Overall balance assessment: Needs assistance ?Sitting-balance support: Single extremity supported ?Sitting balance-Leahy Scale: Good ?  ?  ?Standing balance support: Single extremity supported ?Standing balance-Leahy Scale: Fair ?  ?  ?  ?  ?  ?  ?  ?  ?  ?  ?  ?  ?   ? ? ? ?Pertinent Vitals/Pain Pain Assessment ?Pain Assessment: No/denies pain  ? ? ?Home Living Family/patient expects to be discharged to:: Private residence ?Living Arrangements: Children ?Available Help at Discharge: Family;Available PRN/intermittently ?Type of Home: House ?Home Access: Ramped entrance ?  ?  ?  ?Home Layout: One level ?Home Equipment: Agricultural consultant (2 wheels);Wheelchair - manual ?   ?  ?Prior Function Prior Level of Function : Needs assist;History of Falls (last six months) ?  ?  ?  ?  ?  ?  ?Mobility Comments: Pt reports he no longer ambulates in home with RW and only transfers into wheelchair. Multiple falls "too many to count" ?ADLs Comments: Pt reports he was doing these things independently but has not taken a bath since last admission with UE fx ( ~ 2 weeks ago) ?  ? ? ?Hand Dominance  ? Dominant Hand: Right ? ?  ?Extremity/Trunk Assessment  ? Upper Extremity Assessment ?Upper Extremity Assessment: Defer to OT evaluation (L UE in cast) ?RUE Deficits / Details: in ulnar gutter splint and assuming NWB ?  ? ?Lower Extremity Assessment ?Lower Extremity Assessment: Generalized weakness (B foot drop at baseline, grossly 4/5) ?  ? ?   ?Communication  ? Communication: No difficulties  ?Cognition Arousal/Alertness: Awake/alert ?Behavior During Therapy: Flat affect ?Overall Cognitive Status: Within Functional Limits for tasks assessed ?  ?  ?  ?  ?  ?  ?  ?  ?  ?  ?  ?  ?  ?  ?  ?  ?General Comments: very talkative at times. A&O ?  ?  ? ?  ?General Comments   ? ?  ?Exercises    ? ?Assessment/Plan  ?  ?PT Assessment Patient needs continued PT  services  ?PT Problem List Decreased strength;Decreased activity tolerance;Decreased balance;Decreased mobility ? ?   ?  ?PT Treatment Interventions Gait training;Therapeutic exercise;Balance training   ? ?PT Goals (Current goals can be found in the Care Plan section)  ?Acute Rehab PT Goals ?Patient Stated Goal: to go home ?PT Goal Formulation: With patient ?Time For Goal Achievement: 08/20/21 ?Potential to Achieve Goals: Good ? ?  ?Frequency Min 2X/week ?  ? ? ?Co-evaluation   ?  ?  ?  ?  ? ? ?  ?AM-PAC PT "6 Clicks" Mobility  ?Outcome Measure Help needed turning from your back to your side while in a flat bed without using bedrails?: A Little ?Help needed moving from lying on your back to sitting on the side of a flat bed without using bedrails?: A Little ?Help needed moving to and from a bed to a chair (including a wheelchair)?: A Little ?Help needed standing up from a chair using your arms (e.g., wheelchair or bedside chair)?: A Little ?Help needed to walk in hospital room?: Total ?Help needed climbing 3-5 steps with a railing? : Total ?  6 Click Score: 14 ? ?  ?End of Session Equipment Utilized During Treatment: Gait belt ?Activity Tolerance: Patient tolerated treatment well ?Patient left: in bed ?Nurse Communication: Mobility status ?PT Visit Diagnosis: Unsteadiness on feet (R26.81);Repeated falls (R29.6);Muscle weakness (generalized) (M62.81);Difficulty in walking, not elsewhere classified (R26.2) ?  ? ?Time: 1610-9604 ?PT Time Calculation (min) (ACUTE ONLY): 26 min ? ? ?Charges:   PT Evaluation ?$PT Eval Low Complexity: 1 Low ?PT Treatments ?$Wheel Chair Management: 8-22 mins ?  ?   ? ? ?Elizabeth Palau, PT, DPT, GCS ?[phone] ? ? ?Edward Thompson ?08/06/2021, 4:55 PM ? ?

## 2021-08-06 NOTE — Assessment & Plan Note (Addendum)
Nutritionist evaluation, PT OT and SLP as well as TOC consults ?Fall precautions ?

## 2021-08-06 NOTE — ED Notes (Signed)
Pt transported to C-pod, room 31. Report received from [place], [place]  ?

## 2021-08-06 NOTE — Assessment & Plan Note (Signed)
See CAD above.  Had stent in 2015 ?

## 2021-08-06 NOTE — Assessment & Plan Note (Deleted)
Nursing to do intermittent catheterization if patient unable or Foley can be placed ?

## 2021-08-06 NOTE — Assessment & Plan Note (Signed)
IV Rocephin and follow cultures 

## 2021-08-06 NOTE — Progress Notes (Signed)
This is a no charge note as patient was admitted this AM.  H&P reviewed patient seen and examined. ? ?Edward Thompson is a 71 y.o. male with medical history significant for CAD s/p stent, seizure disorder, HTN, sleep apnea, chronic urinary retention secondary to neurogenic bladder who self catheterizes, carotid artery stenosis s/p carotid endarterectomy who had a recent fall, sustaining fracture of the right arm who was brought the ED due to concerns that patient is unable to care for self.  Patient has been reportedly declining of late which led to his recent fall and has been very weak and has been having difficulty self catheterizing and taking care of himself.  He has had no cough or shortness of breath, vomiting, diarrhea, abdominal plain.  He has decreased appetite. ?ED course: BP 141/76 with otherwise normal vitals.  On his blood work, normal WBC, hemoglobin 11.7 which is his baseline, mild hyponatremia of 131, low creatinine of 0.47.  Troponin of 4.  Urinalysis with moderate leukocytes and many bacteria.  EKG, personally viewed and interpreted was nonacute.  Chest x-ray with no active disease.  Patient was started on Rocephin for UTI.  Hospitalist consulted for admission.  ? ?This a.m. received a message from Jackquline Bosch nursing patient systolic blood pressure 80s over 40s.  I ordered IV fluid bolus 500 with improvement of systolic in the 115.  ?Pt sitting up in bed slightly hunched. Talks softly. Denies sob. Not too interactive ? ?Cta with poor respiratory effort ?Reg s1/s2 no gallop ?+edema b/l ? ? ?A/P ?Will consult RD ?PT/OT/SPL ?Contine iv abx.  ?May need placement. ?

## 2021-08-06 NOTE — Progress Notes (Signed)
Admission profile updated. ?

## 2021-08-06 NOTE — Assessment & Plan Note (Signed)
Nursing to do intermittent catheterization if patient unable or Foley can be placed ?

## 2021-08-06 NOTE — ED Notes (Signed)
Pt given tylenol at this time for c/o hand pain.  Pt provided w/tv remote and coke to drink.  Pt is A&O at this time and denies any further needs.   ?

## 2021-08-06 NOTE — Assessment & Plan Note (Signed)
CPAP if desired 

## 2021-08-06 NOTE — Assessment & Plan Note (Signed)
Continue home metoprolol ?

## 2021-08-06 NOTE — H&P (Signed)
?History and Physical  ? ? ?Patient: Edward Thompson IOM:[id number] DOB: 1950/06/19 ?DOA: 08/05/2021 ?DOS: the patient was seen and examined on 08/06/2021 ?PCP: Jodi Marble, MD  ?Patient coming from: Home ? ?Chief Complaint:  ?Chief Complaint  ?Patient presents with  ? Fall  ? Weakness  ? ? ?HPI: Edward Thompson is a 71 y.o. male with medical history significant for CAD s/p stent, seizure disorder, HTN, sleep apnea, chronic urinary retention secondary to neurogenic bladder who self catheterizes, carotid artery stenosis s/p carotid endarterectomy who had a recent fall, sustaining fracture of the right arm who was brought the ED due to concerns that patient is unable to care for self.  Patient has been reportedly declining of late which led to his recent fall and has been very weak and has been having difficulty self catheterizing and taking care of himself.  He has had no cough or shortness of breath, vomiting, diarrhea, abdominal plain.  He has decreased appetite. ?ED course: BP 141/76 with otherwise normal vitals.  On his blood work, normal WBC, hemoglobin 11.7 which is his baseline, mild hyponatremia of 131, low creatinine of 0.47.  Troponin of 4.  Urinalysis with moderate leukocytes and many bacteria.  EKG, personally viewed and interpreted was nonacute.  Chest x-ray with no active disease.  Patient was started on Rocephin for UTI.  Hospitalist consulted for admission.  ? ?Review of Systems: As mentioned in the history of present illness. All other systems reviewed and are negative. ?Past Medical History:  ?Diagnosis Date  ? CAD (coronary artery disease)   ? Carotid stenosis, left 08/2017  ? Diabetes mellitus without complication ([place])   ? Foot drop   ? Fracture of neck ([place]) 2008  ? fell off a roof. required halo x 4 months. also fractured alot of vertebrae  ? Heart attack ([place]) 2015  ? Heart disease   ? Hepatitis   ? 6th grade   ? Hyperlipidemia   ? Hypertension   ? Myocardial infarction acute ([place]) 2015  ?  Seizures ([place])   ? taking phenobarbitol and dilantin. LAST SEIZURE WAS 2016. well controlled on meds  ? Sleep apnea   ? USES CPAP  ? Syncope 2019  ? Vertigo   ? Viral meningitis   ? ?Past Surgical History:  ?Procedure Laterality Date  ? CARDIAC SURGERY    ? COLONOSCOPY    ? COLONOSCOPY WITH PROPOFOL N/A 03/11/2021  ? Procedure: COLONOSCOPY WITH PROPOFOL;  Surgeon: Toledo, Benay Pike, MD;  Location: [facility] ENDOSCOPY;  Service: Gastroenterology;  Laterality: N/A;  ? CORONARY ANGIOPLASTY    ? PATIENT UNAWARE OF THIS  ? CORONARY ARTERY BYPASS GRAFT  2015  ? ENDARTERECTOMY Left 08/19/2017  ? Procedure: ENDARTERECTOMY CAROTID;  Surgeon: Katha Cabal, MD;  Location: [facility] ORS;  Service: Vascular;  Laterality: Left;  ? ESOPHAGOGASTRODUODENOSCOPY N/A 03/11/2021  ? Procedure: ESOPHAGOGASTRODUODENOSCOPY (EGD);  Surgeon: Toledo, Benay Pike, MD;  Location: [facility] ENDOSCOPY;  Service: Gastroenterology;  Laterality: N/A;  DM  ? HERNIA REPAIR Right 1985  ? inguinal  ? ?Social History:  reports that he has never smoked. He has never used smokeless tobacco. He reports that he does not drink alcohol and does not use drugs. ? ?No Known Allergies ? ?Family History  ?Problem Relation Age of Onset  ? Heart disease Mother   ? Heart attack Father   ? ? ?Prior to Admission medications   ?Medication Sig Start Date End Date Taking? Authorizing Provider  ?ACCU-CHEK GUIDE test strip  04/24/21  Yes [provider]  ?Accu-Chek Softclix Lancets lancets  04/24/21  Yes [provider]  ?acetaminophen (TYLENOL) 500 MG tablet Take 500 mg by mouth every 6 (six) hours as needed.   Yes [provider]  ?aspirin EC 81 MG tablet Take 81 mg by mouth at bedtime.   Yes [provider]  ?Blood Glucose Monitoring Suppl (ACCU-CHEK GUIDE) w/Device KIT  04/24/21  Yes [provider]  ?escitalopram (LEXAPRO) 10 MG tablet Take 10 mg by mouth daily. 06/30/21  Yes [provider]  ?feeding supplement, GLUCERNA SHAKE,  (GLUCERNA SHAKE) LIQD Take 237 mLs by mouth 2 (two) times daily between meals. 12/13/20  Yes Lorella Nimrod, MD  ?meclizine (ANTIVERT) 25 MG tablet Take 50 mg by mouth 2 (two) times daily as needed for dizziness.  05/27/14  Yes [provider]  ?metFORMIN (GLUCOPHAGE) 500 MG tablet Take 500 mg by mouth daily.  04/28/17  Yes [provider]  ?metoprolol succinate (TOPROL-XL) 25 MG 24 hr tablet Take 0.5 tablets (12.5 mg total) by mouth daily. 12/13/20  Yes Lorella Nimrod, MD  ?Multiple Vitamin (MULTIVITAMIN) tablet Take 1 tablet by mouth daily.   Yes [provider]  ?oxyCODONE-acetaminophen (PERCOCET) 5-325 MG tablet Take 1 tablet by mouth every 8 (eight) hours as needed for severe pain. 07/19/21 07/19/22 Yes Lavonia Drafts, MD  ?pantoprazole (PROTONIX) 40 MG tablet Take 40 mg by mouth daily. 03/31/21  Yes [provider]  ?PHENobarbital (LUMINAL) 64.8 MG tablet Take 2 tablets (129.6 mg total) by mouth at bedtime. 08/25/17  Yes Latanya Maudlin, NP  ?phenytoin (DILANTIN) 100 MG ER capsule Take 300 mg by mouth at bedtime.    Yes [provider]  ?polyethylene glycol (MIRALAX / GLYCOLAX) 17 g packet Take 17 g by mouth daily. 10/11/20  Yes Annita Brod, MD  ?clopidogrel (PLAVIX) 75 MG tablet Take 75 mg by mouth at bedtime.  ?Patient not taking: Reported on 08/06/2021 01/28/15   [provider]  ?ezetimibe (ZETIA) 10 MG tablet Take 10 mg by mouth daily.  ?Patient not taking: Reported on 08/06/2021 11/27/18   [provider]  ?isosorbide mononitrate (IMDUR) 30 MG 24 hr tablet Take 30 mg by mouth at bedtime.  ?Patient not taking: Reported on 08/06/2021 04/29/17   [provider]  ?lisinopril (ZESTRIL) 5 MG tablet Take 5 mg by mouth daily. ?Patient not taking: Reported on 08/06/2021 03/09/21   [provider]  ?nitroGLYCERIN (NITROSTAT) 0.4 MG SL tablet Place 0.4 mg under the tongue every 5 (five) minutes as needed for chest pain.  08/22/13   [provider]  ?rosuvastatin (CRESTOR) 40 MG tablet Take 40 mg by mouth at bedtime.  ?Patient not taking: Reported on 08/06/2021    [provider]  ?tamsulosin (FLOMAX) 0.4 MG CAPS capsule Take 1 capsule (0.4 mg total) by mouth daily. ?Patient not taking: Reported on 08/06/2021 10/11/20   Annita Brod, MD  ? ? ?Physical Exam: ?Vitals:  ? 08/05/21 2038 08/05/21 2042 08/05/21 2245 08/06/21 0000  ?BP:  (!) 141/76 137/66 (!) 145/66  ?Pulse:  85 69 64  ?Resp:  18 16 14   ?Temp:  98.4 ?F (36.9 ?C)    ?TempSrc:  Oral    ?SpO2:  96% 97% 99%  ?Weight: 77.1 kg     ?Height: 5' 4"  (1.626 m)     ? ?Physical Exam ?Vitals and nursing note reviewed.  ?Constitutional:   ?   General: He is not in acute  distress. ?HENT:  ?   Head: Normocephalic and atraumatic.  ?   Comments: Orofacial dyskinesia ?Cardiovascular:  ?   Rate and Rhythm: Normal rate and regular rhythm.  ?   Pulses: Normal pulses.  ?   Heart sounds: Normal heart sounds.  ?Pulmonary:  ?   Effort: Pulmonary effort is normal.  ?   Breath sounds: Normal breath sounds.  ?Abdominal:  ?   Palpations: Abdomen is soft.  ?   Tenderness: There is no abdominal tenderness.  ?Musculoskeletal:  ?   Comments: Cast on right forearm  ?Neurological:  ?   Mental Status: Mental status is at baseline.  ? ? ? ?Data Reviewed: ?Relevant notes from primary care and specialist visits, past discharge summaries as available in EHR, including Care Everywhere. ?Prior diagnostic testing as pertinent to current admission diagnoses ?Updated medications and problem lists for reconciliation ?ED course, including vitals, labs, imaging, treatment and response to treatment ?Triage notes, nursing and pharmacy notes and ED provider's notes ?Notable results as noted in HPI ? ? ?Assessment and Plan: ?* Failure to thrive in adult ?Nutritionist evaluation, PT OT and SLP as well as TOC consults ?Fall precautions ? ?Complicated urinary tract infection ?IV Rocephin and follow cultures ? ?Neurogenic bladder,  with intermittent self catheterization ?Nursing to do intermittent catheterization if patient unable or Foley can be placed ? ?S/P coronary artery stent placement ?See CAD above.  Had stent in 2015 ? ?CAD

## 2021-08-07 DIAGNOSIS — R627 Adult failure to thrive: Secondary | ICD-10-CM | POA: Diagnosis not present

## 2021-08-07 LAB — CBC
HCT: 32.4 % — ABNORMAL LOW (ref 39.0–52.0)
Hemoglobin: 10.8 g/dL — ABNORMAL LOW (ref 13.0–17.0)
MCH: 30.9 pg (ref 26.0–34.0)
MCHC: 33.3 g/dL (ref 30.0–36.0)
MCV: 92.6 fL (ref 80.0–100.0)
Platelets: 222 10*3/uL (ref 150–400)
RBC: 3.5 MIL/uL — ABNORMAL LOW (ref 4.22–5.81)
RDW: 13.8 % (ref 11.5–15.5)
WBC: 6.4 10*3/uL (ref 4.0–10.5)
nRBC: 0 % (ref 0.0–0.2)

## 2021-08-07 LAB — GLUCOSE, CAPILLARY
Glucose-Capillary: 77 mg/dL (ref 70–99)
Glucose-Capillary: 96 mg/dL (ref 70–99)
Glucose-Capillary: 108 mg/dL — ABNORMAL HIGH (ref 70–99)
Glucose-Capillary: 117 mg/dL — ABNORMAL HIGH (ref 70–99)

## 2021-08-07 LAB — SODIUM: Sodium: 132 mmol/L — ABNORMAL LOW (ref 135–145)

## 2021-08-07 MED ORDER — LACTATED RINGERS IV SOLN
INTRAVENOUS | Status: DC
Start: 2021-08-07 — End: 2021-08-10

## 2021-08-07 NOTE — Progress Notes (Addendum)
Physical Therapy Treatment ?Patient Details ?Name: Edward Thompson ?MRN: [id number] ?DOB: April 27, 1951 ?Today's Date: 08/07/2021 ? ? ?History of Present Illness 71 y.o. male with medical history significant for CAD s/p stent, seizure disorder, HTN, sleep apnea, chronic urinary retention secondary to neurogenic bladder who self catheterizes, carotid artery stenosis s/p carotid endarterectomy who had a recent fall, sustaining fracture of the right arm who was brought the ED due to concerns that patient is unable to care for self.  Patient has been reportedly declining of late which led to his recent fall and has been very weak and has been having difficulty self catheterizing and taking care of himself.  He has had no cough or shortness of breath, vomiting, diarrhea, abdominal plain.  He has decreased appetite. ? ?  ?PT Comments  ? ? Pt is awake and alert resting in recliner upon PT entrance into room today. Pt denies any c/o pain at rest and is willing to work w/ PT today. Pt is able to perform recliner <> wheelchair w/ stand-pivot transfer w/ SUPERVISION. Verbal cues provided for safety and to ensure brakes on wheelchair are applied. Once in wheelchair, he is able to propel himself using his lower extremities. Pt returned to recliner w/ all needs w/in reach prior to PT exiting room. Pt would benefit from continued skilled PT to address deficits observed in evaluation and promote optimal return to PLOF. Current discharge recommendation is SNF due to decline in functional activities and current level of assistance needed with ADLs, self care, and safety awareness. If pt able to obtain 24/7 supervision/assistance at home, may be appropriate for HHPT. ? ?  ?Recommendations for follow up therapy are one component of a multi-disciplinary discharge planning process, led by the attending physician.  Recommendations may be updated based on patient status, additional functional criteria and insurance authorization. ? ?Follow Up  Recommendations ? Skilled nursing-short term rehab (<3 hours/day) ?  ?  ?Assistance Recommended at Discharge Frequent or constant Supervision/Assistance  ?Patient can return home with the following A little help with walking and/or transfers;A lot of help with bathing/dressing/bathroom;Assistance with cooking/housework;Assist for transportation;Help with stairs or ramp for entrance ?  ?Equipment Recommendations ? Other (comment) (grab bars at home)  ?  ?Recommendations for Other Services   ? ? ?  ?Precautions / Restrictions Precautions ?Precautions: Fall ?Restrictions ?Weight Bearing Restrictions: No  ?  ? ?Mobility ? Bed Mobility ?  ?  ?  ?  ?  ?  ?  ?General bed mobility comments: received in recliner ?  ? ?Transfers ?Overall transfer level: Needs assistance ?  ?Transfers: Bed to chair/wheelchair/BSC ?  ?Stand pivot transfers: Supervision ?  ?  ?  ?  ?  ?  ? ?Ambulation/Gait ?  ?  ?  ?  ?  ?  ?  ?General Gait Details: doesn't ambulate at baseline; uses LE for wheelchair mobility ? ? ?Stairs ?  ?  ?  ?  ?  ? ? ?Wheelchair Mobility ?Wheelchair Mobility ?Wheelchair propulsion: Both lower extermities ?Distance: 50 ?Wheelchair Assistance Details (indicate cue type and reason): is able to reach brakes w/ L UE, increased time due to casting on R UE. ? ?Modified Rankin (Stroke Patients Only) ?  ? ? ?  ?Balance Overall balance assessment: Needs assistance ?Sitting-balance support: Single extremity supported ?Sitting balance-Leahy Scale: Good ?  ?  ?Standing balance support: Single extremity supported ?Standing balance-Leahy Scale: Fair ?  ?  ?  ?  ?  ?  ?  ?  ?  ?  ?  ?  ?  ? ?  ?  Cognition Arousal/Alertness: Awake/alert ?Behavior During Therapy: Flat affect ?Overall Cognitive Status: Within Functional Limits for tasks assessed ?  ?  ?  ?  ?  ?  ?  ?  ?  ?  ?  ?  ?  ?  ?  ?  ?General Comments: pleasant and cooperative but needing cuing for safety awareness ?  ?  ? ?  ?Exercises   ? ?  ?General Comments   ?  ?  ? ?Pertinent  Vitals/Pain Pain Assessment ?Pain Assessment: No/denies pain  ? ? ?Home Living   ?  ?  ?  ?  ?  ?  ?  ?  ?  ?   ?  ?Prior Function    ?  ?  ?   ? ?PT Goals (current goals can now be found in the care plan section) Progress towards PT goals: Progressing toward goals ? ?  ?Frequency ? ? ? Min 2X/week ? ? ? ?  ?PT Plan    ? ? ?Co-evaluation   ?  ?  ?  ?  ? ?  ?AM-PAC PT "6 Clicks" Mobility   ?Outcome Measure ? Help needed turning from your back to your side while in a flat bed without using bedrails?: A Little ?Help needed moving from lying on your back to sitting on the side of a flat bed without using bedrails?: A Little ?Help needed moving to and from a bed to a chair (including a wheelchair)?: A Little ?Help needed standing up from a chair using your arms (e.g., wheelchair or bedside chair)?: A Little ?Help needed to walk in hospital room?: Total ?Help needed climbing 3-5 steps with a railing? : Total ?6 Click Score: 14 ? ?  ?End of Session   ?Activity Tolerance: Patient tolerated treatment well ?Patient left: with chair alarm set;in chair;with call bell/phone within reach ?Nurse Communication: Mobility status ?PT Visit Diagnosis: Unsteadiness on feet (R26.81);Repeated falls (R29.6);Muscle weakness (generalized) (M62.81);Difficulty in walking, not elsewhere classified (R26.2) ?  ? ? ?Time: [id number] ?PT Time Calculation (min) (ACUTE ONLY): 18 min ? ?Charges:             ?          ? ? ?Jonnie Kind, SPT ?08/07/2021, 11:40 AM ? ?

## 2021-08-07 NOTE — Progress Notes (Signed)
Occupational Therapy Treatment ?Patient Details ?Name: Edward Thompson ?MRN: [id number] ?DOB: 1951/04/06 ?Today's Date: 08/07/2021 ? ? ?History of present illness 71 y.o. male with medical history significant for CAD s/p stent, seizure disorder, HTN, sleep apnea, chronic urinary retention secondary to neurogenic bladder who self catheterizes, carotid artery stenosis s/p carotid endarterectomy who had a recent fall, sustaining fracture of the right arm who was brought the ED due to concerns that patient is unable to care for self.  Patient has been reportedly declining of late which led to his recent fall and has been very weak and has been having difficulty self catheterizing and taking care of himself.  He has had no cough or shortness of breath, vomiting, diarrhea, abdominal plain.  He has decreased appetite. ?  ?OT comments ? Upon entering the room, pt supine in bed and sleeping with full unpeeled banana hanging from mouth. Pt agreeable to OT intervention. He washes face with set up A to obtain needed items. Supine >sit with min guard to EOB. Pt needing min lifting assist as well as min A for balance to stand and pivot to recliner chair. Pt is not at baseline for functional transfers since he is needing assistance for task. Pt also  likely unable to manage wheelchair and breaks based on his difficulty opening food (banana) this session. Pt needs assist for all aspects of self care because of R UE fracture. TOC note reports family is unable to provide 24/7 assist. OT recommendation changed to short term rehab to address functional deficits before pt returns home.   ? ?Recommendations for follow up therapy are one component of a multi-disciplinary discharge planning process, led by the attending physician.  Recommendations may be updated based on patient status, additional functional criteria and insurance authorization. ?   ?Follow Up Recommendations ? Skilled nursing-short term rehab (<3 hours/day)  ?  ?Assistance  Recommended at Discharge Frequent or constant Supervision/Assistance  ?Patient can return home with the following ? A little help with walking and/or transfers;A little help with bathing/dressing/bathroom;Help with stairs or ramp for entrance;Assist for transportation;Assistance with cooking/housework ?  ?Equipment Recommendations ? None recommended by OT  ?  ?   ?Precautions / Restrictions Precautions ?Precautions: Fall  ? ? ?  ? ?Mobility Bed Mobility ?Overal bed mobility: Needs Assistance ?Bed Mobility: Supine to Sit ?  ?  ?Supine to sit: Min guard ?  ?  ?  ?  ? ?Transfers ?Overall transfer level: Needs assistance ?Equipment used: 1 person hand held assist ?Transfers: Bed to chair/wheelchair/BSC ?Sit to Stand: Min assist ?Stand pivot transfers: Min assist ?  ?  ?  ?  ?General transfer comment: min lifting assist to stand from bed and to pivot safely to recliner chair ?  ?  ?Balance Overall balance assessment: Needs assistance ?Sitting-balance support: Single extremity supported ?Sitting balance-Leahy Scale: Good ?  ?  ?Standing balance support: Single extremity supported ?Standing balance-Leahy Scale: Fair ?  ?  ?  ?  ?  ?  ?  ?  ?  ?  ?  ?  ?   ? ?ADL either performed or assessed with clinical judgement  ? ? ?Extremity/Trunk Assessment Upper Extremity Assessment ?RUE Deficits / Details: in ulnar gutter splint and assuming NWB ?  ?  ?  ?  ?  ? ?Vision Patient Visual Report: No change from baseline ?  ?  ?   ?   ? ?Cognition Arousal/Alertness: Awake/alert ?Behavior During Therapy: Flat affect ?Overall Cognitive Status: Within  Functional Limits for tasks assessed ?  ?  ?  ?  ?  ?  ?  ?  ?  ?  ?  ?  ?  ?  ?  ?  ?General Comments: pleasant and cooperative but needing cuing for safety awareness ?  ?  ?   ?   ?   ?   ? ? ?Pertinent Vitals/ Pain       Pain Assessment ?Pain Assessment: No/denies pain ? ?   ?   ? ?Frequency ? Min 2X/week  ? ? ? ? ?  ?Progress Toward Goals ? ?OT Goals(current goals can now be found in the  care plan section) ? Progress towards OT goals: Progressing toward goals ? ?Acute Rehab OT Goals ?Patient Stated Goal: to get stronger ?OT Goal Formulation: With patient ?Time For Goal Achievement: 08/20/21 ?Potential to Achieve Goals: Fair  ?Plan Discharge plan needs to be updated;Frequency remains appropriate   ? ?   ?AM-PAC OT "6 Clicks" Daily Activity     ?Outcome Measure ? ? Help from another person eating meals?: A Little ?Help from another person taking care of personal grooming?: A Little ?Help from another person toileting, which includes using toliet, bedpan, or urinal?: A Lot ?Help from another person bathing (including washing, rinsing, drying)?: A Lot ?Help from another person to put on and taking off regular upper body clothing?: A Little ?Help from another person to put on and taking off regular lower body clothing?: A Lot ?6 Click Score: 15 ? ?  ?End of Session   ? ?OT Visit Diagnosis: Unsteadiness on feet (R26.81);Repeated falls (R29.6);Muscle weakness (generalized) (M62.81);History of falling (Z91.81) ?  ?Activity Tolerance Patient tolerated treatment well ?  ?Patient Left in bed;with call bell/phone within reach;with bed alarm set ?  ?Nurse Communication Mobility status;Other (comment) (need for I&O cath) ?  ? ?   ? ?Time: 4403-4742 ?OT Time Calculation (min): 23 min ? ?Charges: OT General Charges ?$OT Visit: 1 Visit ?OT Treatments ?$Self Care/Home Management : 8-22 mins ?$Therapeutic Activity: 8-22 mins ? ?Jackquline Denmark, MS, OTR/L , CBIS ?ascom [phone]  ?08/07/21, 11:23 AM  ?

## 2021-08-07 NOTE — Progress Notes (Signed)
?PROGRESS NOTE ? ? ? ?Edward Thompson  XNT:[id number] DOB: 1951/02/24 DOA: 08/05/2021 ?PCP: Sherron Monday, MD  ? ? ?Brief Narrative:  ?Edward Thompson is a 71 y.o. male with medical history significant for CAD s/p stent, seizure disorder, HTN, sleep apnea, chronic urinary retention secondary to neurogenic bladder who self catheterizes, carotid artery stenosis s/p carotid endarterectomy who had a recent fall, sustaining fracture of the right arm who was brought the ED due to concerns that patient is unable to care for self ? ?3/31 OT rec. SNF today ? ?Consultants:  ? ? ?Procedures:  ? ?Antimicrobials:  ?  ? ? ?Subjective: ?Denies shortness of breath, chest pain or nausea ? ?Objective: ?Vitals:  ? 08/06/21 2046 08/06/21 2350 08/07/21 0554 08/07/21 0742  ?BP: 128/63 115/69 111/69 (!) 106/58  ?Pulse: 80 76 67 62  ?Resp: 16 16 16 15   ?Temp: 98.4 ?F (36.9 ?C) 98.7 ?F (37.1 ?C) 98.3 ?F (36.8 ?C) 98 ?F (36.7 ?C)  ?TempSrc:  Oral Oral Oral  ?SpO2: 99% 96% 95% 98%  ?Weight:      ?Height:      ? ? ?Intake/Output Summary (Last 24 hours) at 08/07/2021 0800 ?Last data filed at 08/07/2021 0600 ?Gross per 24 hour  ?Intake --  ?Output 1925 ml  ?Net -1925 ml  ? ?Filed Weights  ? 08/05/21 2038  ?Weight: 77.1 kg  ? ? ?Examination: ?Calm, NAD ?Cta no w/r ?Reg s1/s2 no gallop ?Soft benign +bs ?Positive bilateral edema ?Alert and awake ?Mood and affect appropriate in current setting  ? ? ? ? ?Data Reviewed: I have personally reviewed following labs and imaging studies ? ?CBC: ?Recent Labs  ?Lab 08/05/21 ?2041  ?WBC 9.1  ?HGB 11.7*  ?HCT 35.3*  ?MCV 93.4  ?PLT 248  ? ?Basic Metabolic Panel: ?Recent Labs  ?Lab 08/05/21 ?2041 08/06/21 ?1110  ?NA 131* 131*  ?K 4.3 3.7  ?CL 98 98  ?CO2 26 26  ?GLUCOSE 177* 148*  ?BUN 15 13  ?CREATININE 0.47* 0.47*  ?CALCIUM 8.9 8.4*  ? ?GFR: ?Estimated Creatinine Clearance: 79.5 mL/min (A) (by C-G formula based on SCr of 0.47 mg/dL (L)). ?Liver Function Tests: ?No results for input(s): AST, ALT, ALKPHOS,  BILITOT, PROT, ALBUMIN in the last 168 hours. ?No results for input(s): LIPASE, AMYLASE in the last 168 hours. ?No results for input(s): AMMONIA in the last 168 hours. ?Coagulation Profile: ?No results for input(s): INR, PROTIME in the last 168 hours. ?Cardiac Enzymes: ?No results for input(s): CKTOTAL, CKMB, CKMBINDEX, TROPONINI in the last 168 hours. ?BNP (last 3 results) ?No results for input(s): PROBNP in the last 8760 hours. ?HbA1C: ?Recent Labs  ?  08/05/21 ?2041  ?HGBA1C 5.1  ? ?CBG: ?Recent Labs  ?Lab 08/06/21 ?08/08/21 08/06/21 ?1142 08/06/21 ?1708 08/06/21 ?2047 08/07/21 ?0739  ?GLUCAP 92 123* 156* 91 77  ? ?Lipid Profile: ?No results for input(s): CHOL, HDL, LDLCALC, TRIG, CHOLHDL, LDLDIRECT in the last 72 hours. ?Thyroid Function Tests: ?No results for input(s): TSH, T4TOTAL, FREET4, T3FREE, THYROIDAB in the last 72 hours. ?Anemia Panel: ?No results for input(s): VITAMINB12, FOLATE, FERRITIN, TIBC, IRON, RETICCTPCT in the last 72 hours. ?Sepsis Labs: ?No results for input(s): PROCALCITON, LATICACIDVEN in the last 168 hours. ? ?Recent Results (from the past 240 hour(s))  ?Resp Panel by RT-PCR (Flu A&B, Covid) Nasopharyngeal Swab     Status: None  ? Collection Time: 08/06/21 12:53 AM  ? Specimen: Nasopharyngeal Swab; Nasopharyngeal(NP) swabs in vial transport medium  ?Result Value Ref Range Status  ?  SARS Coronavirus 2 by RT PCR NEGATIVE NEGATIVE Final  ?  Comment: (NOTE) ?SARS-CoV-2 target nucleic acids are NOT DETECTED. ? ?The SARS-CoV-2 RNA is generally detectable in upper respiratory ?specimens during the acute phase of infection. The lowest ?concentration of SARS-CoV-2 viral copies this assay can detect is ?138 copies/mL. A negative result does not preclude SARS-Cov-2 ?infection and should not be used as the sole basis for treatment or ?other patient management decisions. A negative result may occur with  ?improper specimen collection/handling, submission of specimen other ?than nasopharyngeal swab,  presence of viral mutation(s) within the ?areas targeted by this assay, and inadequate number of viral ?copies(<138 copies/mL). A negative result must be combined with ?clinical observations, patient history, and epidemiological ?information. The expected result is Negative. ? ?Fact Sheet for Patients:  ?BloggerCourse.com ? ?Fact Sheet for Healthcare Providers:  ?SeriousBroker.it ? ?This test is no t yet approved or cleared by the [place] FDA and  ?has been authorized for detection and/or diagnosis of SARS-CoV-2 by ?FDA under an Emergency Use Authorization (EUA). This EUA will remain  ?in effect (meaning this test can be used) for the duration of the ?COVID-19 declaration under Section 564(b)(1) of the Act, 21 ?U.S.C.section 360bbb-3(b)(1), unless the authorization is terminated  ?or revoked sooner.  ? ? ?  ? Influenza A by PCR NEGATIVE NEGATIVE Final  ? Influenza B by PCR NEGATIVE NEGATIVE Final  ?  Comment: (NOTE) ?The Xpert Xpress SARS-CoV-2/FLU/RSV plus assay is intended as an aid ?in the diagnosis of influenza from Nasopharyngeal swab specimens and ?should not be used as a sole basis for treatment. Nasal washings and ?aspirates are unacceptable for Xpert Xpress SARS-CoV-2/FLU/RSV ?testing. ? ?Fact Sheet for Patients: ?BloggerCourse.com ? ?Fact Sheet for Healthcare Providers: ?SeriousBroker.it ? ?This test is not yet approved or cleared by the [place] FDA and ?has been authorized for detection and/or diagnosis of SARS-CoV-2 by ?FDA under an Emergency Use Authorization (EUA). This EUA will remain ?in effect (meaning this test can be used) for the duration of the ?COVID-19 declaration under Section 564(b)(1) of the Act, 21 U.S.C. ?section 360bbb-3(b)(1), unless the authorization is terminated or ?revoked. ? ?Performed at [facility], [address] [place], ?[place] [address] ?  ?   ? ? ? ? ? ?Radiology Studies: ?DG Chest 1 View ? ?Result Date: 08/05/2021 ?CLINICAL DATA:  Weakness and recent falls. EXAM: CHEST  1 VIEW COMPARISON:  None. FINDINGS: Mild, diffuse, chronic appearing increased interstitial lung markings are seen. There is mild elevation of the left hemidiaphragm with very mild atelectasis noted along the left lung base. There is no evidence of focal consolidation, pleural effusion or pneumothorax. The heart size and mediastinal contours are within normal limits. There is mild calcification of the aortic arch. Multiple chronic left-sided rib fractures are seen. Multilevel degenerative changes are noted throughout the thoracic spine. IMPRESSION: 1. Chronic appearing increased interstitial lung markings without evidence of acute or active cardiopulmonary disease. 2. Multiple chronic left-sided rib fractures. Electronically Signed   By: Aram Candela M.D.   On: 08/05/2021 21:16   ? ? ? ? ? ?Scheduled Meds: ? aspirin EC  81 mg Oral QHS  ? enoxaparin (LOVENOX) injection  40 mg Subcutaneous Q24H  ? escitalopram  10 mg Oral Daily  ? insulin aspart  0-15 Units Subcutaneous TID WC  ? insulin aspart  0-5 Units Subcutaneous QHS  ? PHENobarbital  129.6 mg Oral QHS  ? phenytoin  300 mg Oral QHS  ? ?Continuous Infusions: ?  cefTRIAXone (ROCEPHIN)  IV Stopped (08/07/21 0100)  ? lactated ringers    ? ? ?Assessment & Plan: ?  ?Principal Problem: ?  Failure to thrive in adult ?Active Problems: ?  Complicated urinary tract infection ?  CAD (coronary artery disease) ?  S/P coronary artery stent placement ?  Neurogenic bladder, with intermittent self catheterization ?  Essential hypertension ?  OSA on CPAP ?  Seizure disorder (HCC) ? ? ?Failure to thrive in adult ?Fall precautions ?PT OT ?  ?Complicated urinary tract infection ?Urine culture with E. Coli ?Sensitivities pending ?Continue IV antibiotic ?  ?Neurogenic bladder, with intermittent self catheterization ?Does intermittent cathing at home  ? ?   ? ? ?  ?CAD (coronary artery disease) ?Hx/o stent in 2015 ?Hold bp meds No complaints of chest pain, troponin negative and EKG nonacute ?Continue metoprolol and aspirin with nitroglycerin sublingual as needed chest pain.
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
## 2021-08-07 NOTE — TOC Progression Note (Addendum)
Transition of Care (TOC) - Progression Note  ? ? ?Patient Details  ?Name: Edward Thompson ?MRN: [id number] ?Date of Birth: 08-19-1950 ? ?Transition of Care (TOC) CM/SW Contact  ?Rayanne Padmanabhan E Terrionna Bridwell, LCSW ?Phone Number: ?08/07/2021, 2:07 PM ? ?Clinical Narrative:    ?Tammy at [facility] can accept patient tomorrow if insurance Berkley Harvey is approved and if DC Summary is in before 12 noon. ?Insurance auth to be started today by SNF as patient is not managed by [employer]. ?Updated patient's daughter and Attending MD. ? ? ?Expected Discharge Plan: Home w Home Health Services ?Barriers to Discharge: Continued Medical Work up ? ?Expected Discharge Plan and Services ?Expected Discharge Plan: Home w Home Health Services ?  ?  ?  ?  ?                ?  ?  ?  ?  ?  ?  ?  ?  ?  ?  ? ? ?Social Determinants of Health (SDOH) Interventions ?  ? ?Readmission Risk Interventions ? ?  08/07/2021  ?  9:31 AM  ?Readmission Risk Prevention Plan  ?Transportation Screening Complete  ?PCP or Specialist Appt within 5-7 Days Complete  ?Home Care Screening Complete  ?Medication Review (RN CM) Complete  ? ? ?
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
## 2021-08-08 DIAGNOSIS — R627 Adult failure to thrive: Secondary | ICD-10-CM | POA: Diagnosis not present

## 2021-08-08 LAB — GLUCOSE, CAPILLARY
Glucose-Capillary: 97 mg/dL (ref 70–99)
Glucose-Capillary: 97 mg/dL (ref 70–99)
Glucose-Capillary: 142 mg/dL — ABNORMAL HIGH (ref 70–99)
Glucose-Capillary: 88 mg/dL (ref 70–99)

## 2021-08-08 LAB — URINE CULTURE: Culture: >=100000 — AB

## 2021-08-08 MED ORDER — CHLORHEXIDINE GLUCONATE CLOTH 2 % EX PADS
6.0000 | MEDICATED_PAD | Freq: Every day | CUTANEOUS | Status: DC
  Administered 2021-08-08 – 2021-08-11 (×4): 6 via TOPICAL

## 2021-08-08 MED ORDER — SENNOSIDES-DOCUSATE SODIUM 8.6-50 MG PO TABS
1.0000 | ORAL_TABLET | Freq: Every day | ORAL | Status: DC
  Administered 2021-08-08 – 2021-08-10 (×3): 1 via ORAL
  Filled 2021-08-08 (×3): qty 1

## 2021-08-08 MED ORDER — SODIUM CHLORIDE 0.9% FLUSH
10.0000 mL | Freq: Two times a day (BID) | INTRAVENOUS | Status: DC
  Administered 2021-08-08 – 2021-08-11 (×6): 10 mL

## 2021-08-08 MED ORDER — POLYETHYLENE GLYCOL 3350 17 G PO PACK
17.0000 g | PACK | Freq: Every day | ORAL | Status: DC
  Administered 2021-08-08 – 2021-08-11 (×4): 17 g via ORAL
  Filled 2021-08-08 (×4): qty 1

## 2021-08-08 MED ORDER — NITROFURANTOIN MONOHYD MACRO 100 MG PO CAPS
100.0000 mg | ORAL_CAPSULE | Freq: Two times a day (BID) | ORAL | Status: DC
Start: 2021-08-08 — End: 2021-08-11
  Administered 2021-08-08 – 2021-08-11 (×7): 100 mg via ORAL
  Filled 2021-08-08 (×7): qty 1

## 2021-08-08 NOTE — Progress Notes (Signed)
?PROGRESS NOTE ? ? ? ?Edward Thompson  BSJ:[id number] DOB: [birth year] DOA: 08/05/2021 ?PCP: Sherron Monday, MD  ? ? ?Brief Narrative:  ?Edward Thompson is a 71 y.o. male with medical history significant for CAD s/p stent, seizure disorder, HTN, sleep apnea, chronic urinary retention secondary to neurogenic bladder who self catheterizes, carotid artery stenosis s/p carotid endarterectomy who had a recent fall, sustaining fracture of the right arm who was brought the ED due to concerns that patient is unable to care for self ? ?4/1 no overnight issues.  ?Consultants:  ? ? ?Procedures:  ? ?Antimicrobials:  ?  ? ? ?Subjective: ?Denies sob, cp, abd pain ? ?Objective: ?Vitals:  ? 08/07/21 1609 08/07/21 2119 08/08/21 0525 08/08/21 0831  ?BP: (!) 102/56 (!) 145/66 (!) 145/67 (!) 129/48  ?Pulse: 67 82 80 65  ?Resp: 16 20 19 18   ?Temp: 97.6 ?F (36.4 ?C) 98.5 ?F (36.9 ?C) 99.2 ?F (37.3 ?C) 97.8 ?F (36.6 ?C)  ?TempSrc: Oral Oral    ?SpO2: 100% 99% 97% 97%  ?Weight:      ?Height:      ? ? ?Intake/Output Summary (Last 24 hours) at 08/08/2021 1209 ?Last data filed at 08/08/2021 10/08/2021 ?Gross per 24 hour  ?Intake 1002.49 ml  ?Output 2850 ml  ?Net -1847.51 ml  ? ?Filed Weights  ? 08/05/21 2038  ?Weight: 77.1 kg  ? ? ?Examination: ?Calm, nad ?Cta no w/r ?Reg s1/s2 no gallop ?Soft benign +bs ?No edema ?Awake and alert ? ? ? ? ?Data Reviewed: I have personally reviewed following labs and imaging studies ? ?CBC: ?Recent Labs  ?Lab 08/05/21 ?2041 08/07/21 ?0818  ?WBC 9.1 6.4  ?HGB 11.7* 10.8*  ?HCT 35.3* 32.4*  ?MCV 93.4 92.6  ?PLT 248 222  ? ?Basic Metabolic Panel: ?Recent Labs  ?Lab 08/05/21 ?2041 08/06/21 ?1110 08/07/21 ?0818  ?NA 131* 131* 132*  ?K 4.3 3.7  --   ?CL 98 98  --   ?CO2 26 26  --   ?GLUCOSE 177* 148*  --   ?BUN 15 13  --   ?CREATININE 0.47* 0.47*  --   ?CALCIUM 8.9 8.4*  --   ? ?GFR: ?Estimated Creatinine Clearance: 79.5 mL/min (A) (by C-G formula based on SCr of 0.47 mg/dL (L)). ?Liver Function Tests: ?No results for  input(s): AST, ALT, ALKPHOS, BILITOT, PROT, ALBUMIN in the last 168 hours. ?No results for input(s): LIPASE, AMYLASE in the last 168 hours. ?No results for input(s): AMMONIA in the last 168 hours. ?Coagulation Profile: ?No results for input(s): INR, PROTIME in the last 168 hours. ?Cardiac Enzymes: ?No results for input(s): CKTOTAL, CKMB, CKMBINDEX, TROPONINI in the last 168 hours. ?BNP (last 3 results) ?No results for input(s): PROBNP in the last 8760 hours. ?HbA1C: ?Recent Labs  ?  08/05/21 ?2041  ?HGBA1C 5.1  ? ?CBG: ?Recent Labs  ?Lab 08/07/21 ?0739 08/07/21 ?1123 08/07/21 ?1606 08/07/21 ?2123 08/08/21 ?10/08/21  ?GLUCAP 77 96 108* 117* 97  ? ?Lipid Profile: ?No results for input(s): CHOL, HDL, LDLCALC, TRIG, CHOLHDL, LDLDIRECT in the last 72 hours. ?Thyroid Function Tests: ?No results for input(s): TSH, T4TOTAL, FREET4, T3FREE, THYROIDAB in the last 72 hours. ?Anemia Panel: ?No results for input(s): VITAMINB12, FOLATE, FERRITIN, TIBC, IRON, RETICCTPCT in the last 72 hours. ?Sepsis Labs: ?No results for input(s): PROCALCITON, LATICACIDVEN in the last 168 hours. ? ?Recent Results (from the past 240 hour(s))  ?Urine Culture     Status: Abnormal  ? Collection Time: 08/05/21 10:58 PM  ? Specimen:  Urine, Random  ?Result Value Ref Range Status  ? Specimen Description   Final  ?  URINE, RANDOM ?Performed at [facility], [address], [place], [place] [address] ?  ? Special Requests   Final  ?  NONE ?Performed at [facility], [address], [place], [place] [address] ?  ? Culture (A)  Final  ?  >=100,000 COLONIES/mL ESCHERICHIA COLI ?Confirmed Extended Spectrum Beta-Lactamase Producer (ESBL).  In bloodstream infections from ESBL organisms, carbapenems are preferred over piperacillin/tazobactam. They are shown to have a lower risk of mortality. ?  ? Report Status 08/08/2021 FINAL  Final  ? Organism ID, Bacteria ESCHERICHIA COLI (A)  Final  ?    Susceptibility  ? Escherichia coli - MIC*  ?  AMPICILLIN  >=32 RESISTANT Resistant   ?  CEFAZOLIN >=64 RESISTANT Resistant   ?  CEFEPIME 16 RESISTANT Resistant   ?  CEFTRIAXONE >=64 RESISTANT Resistant   ?  CIPROFLOXACIN >=4 RESISTANT Resistant   ?  GENTAMICIN >=16 RESISTANT Resistant   ?  IMIPENEM <=0.25 SENSITIVE Sensitive   ?  NITROFURANTOIN <=16 SENSITIVE Sensitive   ?  TRIMETH/SULFA <=20 SENSITIVE Sensitive   ?  AMPICILLIN/SULBACTAM 16 INTERMEDIATE Intermediate   ?  PIP/TAZO <=4 SENSITIVE Sensitive   ?  * >=100,000 COLONIES/mL ESCHERICHIA COLI  ?Resp Panel by RT-PCR (Flu A&B, Covid) Nasopharyngeal Swab     Status: None  ? Collection Time: 08/06/21 12:53 AM  ? Specimen: Nasopharyngeal Swab; Nasopharyngeal(NP) swabs in vial transport medium  ?Result Value Ref Range Status  ? SARS Coronavirus 2 by RT PCR NEGATIVE NEGATIVE Final  ?  Comment: (NOTE) ?SARS-CoV-2 target nucleic acids are NOT DETECTED. ? ?The SARS-CoV-2 RNA is generally detectable in upper respiratory ?specimens during the acute phase of infection. The lowest ?concentration of SARS-CoV-2 viral copies this assay can detect is ?138 copies/mL. A negative result does not preclude SARS-Cov-2 ?infection and should not be used as the sole basis for treatment or ?other patient management decisions. A negative result may occur with  ?improper specimen collection/handling, submission of specimen other ?than nasopharyngeal swab, presence of viral mutation(s) within the ?areas targeted by this assay, and inadequate number of viral ?copies(<138 copies/mL). A negative result must be combined with ?clinical observations, patient history, and epidemiological ?information. The expected result is Negative. ? ?Fact Sheet for Patients:  ?BloggerCourse.com ? ?Fact Sheet for Healthcare Providers:  ?SeriousBroker.it ? ?This test is no t yet approved or cleared by the [place] FDA and  ?has been authorized for detection and/or diagnosis of SARS-CoV-2 by ?FDA under an Emergency  Use Authorization (EUA). This EUA will remain  ?in effect (meaning this test can be used) for the duration of the ?COVID-19 declaration under Section 564(b)(1) of the Act, 21 ?U.S.C.section 360bbb-3(b)(1), unless the authorization is terminated  ?or revoked sooner.  ? ? ?  ? Influenza A by PCR NEGATIVE NEGATIVE Final  ? Influenza B by PCR NEGATIVE NEGATIVE Final  ?  Comment: (NOTE) ?The Xpert Xpress SARS-CoV-2/FLU/RSV plus assay is intended as an aid ?in the diagnosis of influenza from Nasopharyngeal swab specimens and ?should not be used as a sole basis for treatment. Nasal washings and ?aspirates are unacceptable for Xpert Xpress SARS-CoV-2/FLU/RSV ?testing. ? ?Fact Sheet for Patients: ?BloggerCourse.com ? ?Fact Sheet for Healthcare Providers: ?SeriousBroker.it ? ?This test is not yet approved or cleared by the [place] FDA and ?has been authorized for detection and/or diagnosis of SARS-CoV-2 by ?FDA under an Emergency Use Authorization (EUA).  This EUA will remain ?in effect (meaning this test can be used) for the duration of the ?COVID-19 declaration under Section 564(b)(1) of the Act, 21 U.S.C. ?section 360bbb-3(b)(1), unless the authorization is terminated or ?revoked. ? ?Performed at [facility], [address] [place], ?[place] [address] ?  ?  ? ? ? ? ? ?Radiology Studies: ?No results found. ? ? ? ? ? ?Scheduled Meds: ? aspirin EC  81 mg Oral QHS  ? Chlorhexidine Gluconate Cloth  6 each Topical Daily  ? enoxaparin (LOVENOX) injection  40 mg Subcutaneous Q24H  ? escitalopram  10 mg Oral Daily  ? insulin aspart  0-15 Units Subcutaneous TID WC  ? insulin aspart  0-5 Units Subcutaneous QHS  ? nitrofurantoin (macrocrystal-monohydrate)  100 mg Oral Q12H  ? PHENobarbital  129.6 mg Oral QHS  ? phenytoin  300 mg Oral QHS  ? sodium chloride flush  10-40 mL Intracatheter Q12H  ? ?Continuous Infusions: ? lactated ringers 50 mL/hr at 08/08/21 7782   ? ? ?Assessment & Plan: ?  ?Principal Problem: ?  Failure to thrive in adult ?Active Problems: ?  Complicated urinary tract infection ?  CAD (coronary artery disease) ?  S/P coronary artery stent placement ?  Neurogen

## 2021-08-08 NOTE — Plan of Care (Signed)
?  Problem: Clinical Measurements: ?Goal: Diagnostic test results will improve ?Outcome: Progressing ?  ?Problem: Coping: ?Goal: Level of anxiety will decrease ?Outcome: Progressing ?  ?Problem: Elimination: ?Goal: Will not experience complications related to urinary retention ?Outcome: Progressing ?  ?

## 2021-08-08 NOTE — TOC Progression Note (Addendum)
Transition of Care (TOC) - Progression Note  ? ? ?Patient Details  ?Name: Edward Thompson ?MRN: [id number] ?Date of Birth: 02-13-1951 ? ?Transition of Care (TOC) CM/SW Contact  ?Makaylah Oddo E Temitope Flammer, LCSW ?Phone Number: ?08/08/2021, 8:44 AM ? ?Clinical Narrative:    ?Reached out to [facility] to inquire if she has obtained insurance auth. Waiting on reply. ? ?10:20- Per [facility], they are still waiting on auth. They hope to get it back today. If not, would have to be Monday as they do not take Sunday admits. ? ?3:00- Still no auth and their Pharmacy closes at 3. Plan for [facility] Monday pending auth.  ? ?Expected Discharge Plan: Home w Home Health Services ?Barriers to Discharge: Continued Medical Work up ? ?Expected Discharge Plan and Services ?Expected Discharge Plan: Home w Home Health Services ?  ?  ?  ?  ?                ?  ?  ?  ?  ?  ?  ?  ?  ?  ?  ? ? ?Social Determinants of Health (SDOH) Interventions ?  ? ?Readmission Risk Interventions ? ?  08/07/2021  ?  9:31 AM  ?Readmission Risk Prevention Plan  ?Transportation Screening Complete  ?PCP or Specialist Appt within 5-7 Days Complete  ?Home Care Screening Complete  ?Medication Review (RN CM) Complete  ? ? ?

## 2021-08-09 DIAGNOSIS — R627 Adult failure to thrive: Secondary | ICD-10-CM | POA: Diagnosis not present

## 2021-08-09 LAB — GLUCOSE, CAPILLARY
Glucose-Capillary: 90 mg/dL (ref 70–99)
Glucose-Capillary: 158 mg/dL — ABNORMAL HIGH (ref 70–99)
Glucose-Capillary: 121 mg/dL — ABNORMAL HIGH (ref 70–99)
Glucose-Capillary: 98 mg/dL (ref 70–99)

## 2021-08-09 MED ORDER — AMLODIPINE BESYLATE 5 MG PO TABS
5.0000 mg | ORAL_TABLET | Freq: Every day | ORAL | Status: DC
  Administered 2021-08-09 – 2021-08-11 (×3): 5 mg via ORAL
  Filled 2021-08-09 (×3): qty 1

## 2021-08-09 NOTE — Progress Notes (Signed)
?PROGRESS NOTE ? ? ? ?Edward Thompson  [id number] DOB: 04-23-1951 DOA: 08/05/2021 ?PCP: Jodi Marble, MD  ? ? ?Brief Narrative:  ?Edward Thompson is a 71 y.o. male with medical history significant for CAD s/p stent, seizure disorder, HTN, sleep apnea, chronic urinary retention secondary to neurogenic bladder who self catheterizes, carotid artery stenosis s/p carotid endarterectomy who had a recent fall, sustaining fracture of the right arm who was brought the ED due to concerns that patient is unable to care for self ? ?4/1 no overnight issues.  ?4/2 no complaints.   ?Consultants:  ? ? ?Procedures:  ? ?Antimicrobials:  ?  ? ? ?Subjective: ?Denies sob, cp, no dizziness ? ?Objective: ?Vitals:  ? 08/08/21 2136 08/09/21 0105 08/09/21 0448 08/09/21 [id number]  ?BP: (!) 179/90 (!) 150/93 (!) 141/75 (!) 148/66  ?Pulse: 77 79 66 64  ?Resp: 18 18 18 15   ?Temp: 98.2 ?F (36.8 ?C) 97.9 ?F (36.6 ?C) 97.6 ?F (36.4 ?C) 98 ?F (36.7 ?C)  ?TempSrc: Oral Oral    ?SpO2: 100% 97% 97% 98%  ?Weight:      ?Height:      ? ? ?Intake/Output Summary (Last 24 hours) at 08/09/2021 0816 ?Last data filed at 08/09/2021 0550 ?Gross per 24 hour  ?Intake --  ?Output 1550 ml  ?Net -1550 ml  ? ?Filed Weights  ? 08/05/21 2038  ?Weight: 77.1 kg  ? ? ?Examination: ?Calm, NAD ?Cta no w/r ?Reg s1/s2 no gallop ?Soft benign +bs ?No edema ?Awake and alert ?Mood and affect appropriate in current setting  ? ? ? ? ?Data Reviewed: I have personally reviewed following labs and imaging studies ? ?CBC: ?Recent Labs  ?Lab 08/05/21 ?2041 08/07/21 ?0818  ?WBC 9.1 6.4  ?HGB 11.7* 10.8*  ?HCT 35.3* 32.4*  ?MCV 93.4 92.6  ?PLT 248 222  ? ?Basic Metabolic Panel: ?Recent Labs  ?Lab 08/05/21 ?2041 08/06/21 ?1110 08/07/21 ?0818  ?NA 131* 131* 132*  ?K 4.3 3.7  --   ?CL 98 98  --   ?CO2 26 26  --   ?GLUCOSE 177* 148*  --   ?BUN 15 13  --   ?CREATININE 0.47* 0.47*  --   ?CALCIUM 8.9 8.4*  --   ? ?GFR: ?Estimated Creatinine Clearance: 79.5 mL/min (A) (by C-G formula based on SCr of  0.47 mg/dL (L)). ?Liver Function Tests: ?No results for input(s): AST, ALT, ALKPHOS, BILITOT, PROT, ALBUMIN in the last 168 hours. ?No results for input(s): LIPASE, AMYLASE in the last 168 hours. ?No results for input(s): AMMONIA in the last 168 hours. ?Coagulation Profile: ?No results for input(s): INR, PROTIME in the last 168 hours. ?Cardiac Enzymes: ?No results for input(s): CKTOTAL, CKMB, CKMBINDEX, TROPONINI in the last 168 hours. ?BNP (last 3 results) ?No results for input(s): PROBNP in the last 8760 hours. ?HbA1C: ?No results for input(s): HGBA1C in the last 72 hours. ? ?CBG: ?Recent Labs  ?Lab 08/08/21 ?[id number] 08/08/21 ?1212 08/08/21 ?1619 08/08/21 ?2139 08/09/21 ?0810  ?GLUCAP 97 97 142* 88 90  ? ?Lipid Profile: ?No results for input(s): CHOL, HDL, LDLCALC, TRIG, CHOLHDL, LDLDIRECT in the last 72 hours. ?Thyroid Function Tests: ?No results for input(s): TSH, T4TOTAL, FREET4, T3FREE, THYROIDAB in the last 72 hours. ?Anemia Panel: ?No results for input(s): VITAMINB12, FOLATE, FERRITIN, TIBC, IRON, RETICCTPCT in the last 72 hours. ?Sepsis Labs: ?No results for input(s): PROCALCITON, LATICACIDVEN in the last 168 hours. ? ?Recent Results (from the past 240 hour(s))  ?Urine Culture     Status:  Abnormal  ? Collection Time: 08/05/21 10:58 PM  ? Specimen: Urine, Random  ?Result Value Ref Range Status  ? Specimen Description   Final  ?  URINE, RANDOM ?Performed at [facility], [address], [place], [place] [address] ?  ? Special Requests   Final  ?  NONE ?Performed at [facility], [address] [place], [place] [address] ?  ? Culture (A)  Final  ?  >=100,000 COLONIES/mL ESCHERICHIA COLI ?Confirmed Extended Spectrum Beta-Lactamase Producer (ESBL).  In bloodstream infections from ESBL organisms, carbapenems are preferred over piperacillin/tazobactam. They are shown to have a lower risk of mortality. ?  ? Report Status 08/08/2021 FINAL  Final  ? Organism ID, Bacteria ESCHERICHIA COLI (A)  Final   ?    Susceptibility  ? Escherichia coli - MIC*  ?  AMPICILLIN >=32 RESISTANT Resistant   ?  CEFAZOLIN >=64 RESISTANT Resistant   ?  CEFEPIME 16 RESISTANT Resistant   ?  CEFTRIAXONE >=64 RESISTANT Resistant   ?  CIPROFLOXACIN >=4 RESISTANT Resistant   ?  GENTAMICIN >=16 RESISTANT Resistant   ?  IMIPENEM <=0.25 SENSITIVE Sensitive   ?  NITROFURANTOIN <=16 SENSITIVE Sensitive   ?  TRIMETH/SULFA <=20 SENSITIVE Sensitive   ?  AMPICILLIN/SULBACTAM 16 INTERMEDIATE Intermediate   ?  PIP/TAZO <=4 SENSITIVE Sensitive   ?  * >=100,000 COLONIES/mL ESCHERICHIA COLI  ?Resp Panel by RT-PCR (Flu A&B, Covid) Nasopharyngeal Swab     Status: None  ? Collection Time: 08/06/21 12:53 AM  ? Specimen: Nasopharyngeal Swab; Nasopharyngeal(NP) swabs in vial transport medium  ?Result Value Ref Range Status  ? SARS Coronavirus 2 by RT PCR NEGATIVE NEGATIVE Final  ?  Comment: (NOTE) ?SARS-CoV-2 target nucleic acids are NOT DETECTED. ? ?The SARS-CoV-2 RNA is generally detectable in upper respiratory ?specimens during the acute phase of infection. The lowest ?concentration of SARS-CoV-2 viral copies this assay can detect is ?138 copies/mL. A negative result does not preclude SARS-Cov-2 ?infection and should not be used as the sole basis for treatment or ?other patient management decisions. A negative result may occur with  ?improper specimen collection/handling, submission of specimen other ?than nasopharyngeal swab, presence of viral mutation(s) within the ?areas targeted by this assay, and inadequate number of viral ?copies(<138 copies/mL). A negative result must be combined with ?clinical observations, patient history, and epidemiological ?information. The expected result is Negative. ? ?Fact Sheet for Patients:  ?EntrepreneurPulse.com.au ? ?Fact Sheet for Healthcare Providers:  ?IncredibleEmployment.be ? ?This test is no t yet approved or cleared by the [place] FDA and  ?has been authorized for  detection and/or diagnosis of SARS-CoV-2 by ?FDA under an Emergency Use Authorization (EUA). This EUA will remain  ?in effect (meaning this test can be used) for the duration of the ?COVID-19 declaration under Section 564(b)(1) of the Act, 21 ?U.S.C.section 360bbb-3(b)(1), unless the authorization is terminated  ?or revoked sooner.  ? ? ?  ? Influenza A by PCR NEGATIVE NEGATIVE Final  ? Influenza B by PCR NEGATIVE NEGATIVE Final  ?  Comment: (NOTE) ?The Xpert Xpress SARS-CoV-2/FLU/RSV plus assay is intended as an aid ?in the diagnosis of influenza from Nasopharyngeal swab specimens and ?should not be used as a sole basis for treatment. Nasal washings and ?aspirates are unacceptable for Xpert Xpress SARS-CoV-2/FLU/RSV ?testing. ? ?Fact Sheet for Patients: ?EntrepreneurPulse.com.au ? ?Fact Sheet for Healthcare Providers: ?IncredibleEmployment.be ? ?This test is not yet approved or cleared by the [place] and ?has been authorized for detection and/or  diagnosis of SARS-CoV-2 by ?FDA under an Emergency Use Authorization (EUA). This EUA will remain ?in effect (meaning this test can be used) for the duration of the ?COVID-19 declaration under Section 564(b)(1) of the Act, 21 U.S.C. ?section 360bbb-3(b)(1), unless the authorization is terminated or ?revoked. ? ?Performed at [facility], [place], ?[place] [address] ?  ?  ? ? ? ? ? ?Radiology Studies: ?No results found. ? ? ? ? ? ?Scheduled Meds: ? aspirin EC  81 mg Oral QHS  ? Chlorhexidine Gluconate Cloth  6 each Topical Daily  ? enoxaparin (LOVENOX) injection  40 mg Subcutaneous Q24H  ? escitalopram  10 mg Oral Daily  ? insulin aspart  0-15 Units Subcutaneous TID WC  ? insulin aspart  0-5 Units Subcutaneous QHS  ? nitrofurantoin (macrocrystal-monohydrate)  100 mg Oral Q12H  ? PHENobarbital  129.6 mg Oral QHS  ? phenytoin  300 mg Oral QHS  ? polyethylene glycol  17 g Oral Daily  ? senna-docusate  1  tablet Oral QHS  ? sodium chloride flush  10-40 mL Intracatheter Q12H  ? ?Continuous Infusions: ? lactated ringers 50 mL/hr at 08/08/21 [id number]  ? ? ?Assessment & Plan: ?  ?Principal Problem: ?  Failure to thrive in

## 2021-08-10 DIAGNOSIS — R627 Adult failure to thrive: Secondary | ICD-10-CM | POA: Diagnosis not present

## 2021-08-10 LAB — GLUCOSE, CAPILLARY
Glucose-Capillary: 89 mg/dL (ref 70–99)
Glucose-Capillary: 105 mg/dL — ABNORMAL HIGH (ref 70–99)
Glucose-Capillary: 135 mg/dL — ABNORMAL HIGH (ref 70–99)
Glucose-Capillary: 130 mg/dL — ABNORMAL HIGH (ref 70–99)

## 2021-08-10 MED ORDER — AMLODIPINE BESYLATE 5 MG PO TABS: 2.5000 mg | ORAL_TABLET | Freq: Every day | ORAL | Status: DC

## 2021-08-10 MED ORDER — NITROFURANTOIN MONOHYD MACRO 100 MG PO CAPS: 100.0000 mg | ORAL_CAPSULE | Freq: Two times a day (BID) | ORAL | Status: AC

## 2021-08-10 MED ORDER — SENNOSIDES-DOCUSATE SODIUM 8.6-50 MG PO TABS: 1.0000 | ORAL_TABLET | Freq: Every day | ORAL | Status: DC

## 2021-08-10 NOTE — Care Management Important Message (Signed)
Important Message ? ?Patient Details  ?Name: Edward Thompson ?MRN: [id number] ?Date of Birth: 08/17/1950 ? ? ?Medicare Important Message Given:  Yes ? ?Patient is in an isolation room room so I reviewed the Important Message from Medicare by phone [phone]). He is in agreement with his discharge plan and I asked if he would like a copy and he declined.  I wished him well and thanked him for his time. ? ? ?Juliann Pulse A Shanik [place] ?08/10/2021, 11:03 AM ?

## 2021-08-10 NOTE — Discharge Summary (Addendum)
Edward Thompson JTT:[id number] DOB: 21-Oct-1950 DOA: 08/05/2021 ? ?PCP: Jodi Marble, MD ? ?Admit date: 08/05/2021 ?Discharge date: 08/11/21 ? ?Admitted From: home ?Disposition:  [facility] ? ?Recommendations for Outpatient Follow-up:  ?Follow up with PCP in 1 week ?Please obtain BMP/CBC in one week ?Please follow up with urology Debroah Loop or Dr. Bernardo Heater in 1 week ? ? ? ? ?Discharge Condition:Stable ?CODE STATUS:full  ?Diet recommendation: Heart Healthy / Carb Modified  ?Brief/Interim Summary: ?Per HPI: Edward Thompson is a 71 y.o. male with medical history significant for CAD s/p stent, seizure disorder, HTN, sleep apnea, chronic urinary retention secondary to neurogenic bladder who self catheterizes, carotid artery stenosis s/p carotid endarterectomy who had a recent fall, sustaining fracture of the right arm who was brought the ED due to concerns that patient is unable to care for self.  Patient has been reportedly declining of late which led to his recent fall and has been very weak and has been having difficulty self catheterizing and taking care of himself.  He has had no cough or shortness of breath, vomiting, diarrhea, abdominal plain.  He has decreased appetite. ?ED course: BP 141/76 with otherwise normal vitals.  On his blood work, normal WBC, hemoglobin 11.7 which is his baseline, mild hyponatremia of 131, low creatinine of 0.47.  Troponin of 4.  Urinalysis with moderate leukocytes and many bacteria.EKG was nonischemic. Chest x-ray with no active disease.  Patient was started on Rocephin for UTI. Patient was admitted for further management. He was treated with IV fluids and IV antibiotics.  Urine culture came back with more than 100,000 E. coli.  Antibiotics was switched based on the sensitivity.  He remained stable and ready for discharge. ? ?4/4 addendum: no overnight issues. Stable for discharge. No complaints. ? ? ?Failure to thrive in adult ?Fall precautions ?Continue nutrition supplements 3  times daily ?  ?Complicated urinary tract infection ?Urine culture with > 100,000 E. Coli ?IV Rocephin was switched to nitrofurantoin to complete course- last dose 4/10. ?His next dose of nitrofurantoin is 4/3 at 22:00. ?  ?  ?Neurogenic bladder, with intermittent self catheterization ?Self-catheterization at home  to continue  care  ?Needs to f/u with urology  ?He stopped taking his tamsulosin ?  ?  ?  ? Essential hypertension ?Continue beta blk and amlodipine.  ?  ?  ?  ?CAD (coronary artery disease) ?Hx/o stent in 2015 ?On beta blk and asa. ?Stopped taking statin ?  ?  ?  ?  ?  ?Seizure disorder ([place]) ?Continue phenobarbital and phenytoin ?  ?OSA on CPAP ?CPAP if desired ?  ?  ? ? ? ?Discharge Diagnoses:  ?Principal Problem: ?  Failure to thrive in adult ?Active Problems: ?  Complicated urinary tract infection ?  CAD (coronary artery disease) ?  S/P coronary artery stent placement ?  Neurogenic bladder, with intermittent self catheterization ?  Essential hypertension ?  OSA on CPAP ?  Seizure disorder ([place]) ? ? ? ?Discharge Instructions ? ?Discharge Instructions   ? ? Call MD for:  difficulty breathing, headache or visual disturbances   Complete by: As directed ?  ? Diet - low sodium heart healthy   Complete by: As directed ?  ? Diet Carb Modified   Complete by: As directed ?  ? Increase activity slowly   Complete by: As directed ?  ? ?  ? ?Allergies as of 08/10/2021   ?No Known Allergies ?  ? ?  ?Medication List  ?  ? ?  STOP taking these medications   ? ?clopidogrel 75 MG tablet ?Commonly known as: PLAVIX ?  ?ezetimibe 10 MG tablet ?Commonly known as: ZETIA ?  ?isosorbide mononitrate 30 MG 24 hr tablet ?Commonly known as: IMDUR ?  ?lisinopril 5 MG tablet ?Commonly known as: ZESTRIL ?  ?oxyCODONE-acetaminophen 5-325 MG tablet ?Commonly known as: Percocet ?  ?rosuvastatin 40 MG tablet ?Commonly known as: CRESTOR ?  ?tamsulosin 0.4 MG Caps capsule ?Commonly known as: FLOMAX ?  ? ?  ? ?TAKE these medications    ? ?Accu-Chek Guide test strip ?Generic drug: glucose blood ?  ?Accu-Chek Guide w/Device Kit ?  ?Accu-Chek Softclix Lancets lancets ?  ?acetaminophen 500 MG tablet ?Commonly known as: TYLENOL ?Take 500 mg by mouth every 6 (six) hours as needed. ?  ?amLODipine 5 MG tablet ?Commonly known as: NORVASC ?Take 0.5 tablets (2.5 mg total) by mouth daily. ?  ?aspirin EC 81 MG tablet ?Take 81 mg by mouth at bedtime. ?  ?escitalopram 10 MG tablet ?Commonly known as: LEXAPRO ?Take 10 mg by mouth daily. ?  ?feeding supplement (GLUCERNA SHAKE) Liqd ?Take 237 mLs by mouth 2 (two) times daily between meals. ?  ?meclizine 25 MG tablet ?Commonly known as: ANTIVERT ?Take 50 mg by mouth 2 (two) times daily as needed for dizziness. ?  ?metFORMIN 500 MG tablet ?Commonly known as: GLUCOPHAGE ?Take 500 mg by mouth daily. ?  ?metoprolol succinate 25 MG 24 hr tablet ?Commonly known as: TOPROL-XL ?Take 0.5 tablets (12.5 mg total) by mouth daily. ?  ?multivitamin tablet ?Take 1 tablet by mouth daily. ?  ?nitrofurantoin (macrocrystal-monohydrate) 100 MG capsule ?Commonly known as: MACROBID ?Take 1 capsule (100 mg total) by mouth every 12 (twelve) hours for 10 days. ?  ?nitroGLYCERIN 0.4 MG SL tablet ?Commonly known as: NITROSTAT ?Place 0.4 mg under the tongue every 5 (five) minutes as needed for chest pain. ?  ?pantoprazole 40 MG tablet ?Commonly known as: PROTONIX ?Take 40 mg by mouth daily. ?  ?PHENobarbital 64.8 MG tablet ?Commonly known as: LUMINAL ?Take 2 tablets (129.6 mg total) by mouth at bedtime. ?  ?phenytoin 100 MG ER capsule ?Commonly known as: DILANTIN ?Take 300 mg by mouth at bedtime. ?  ?polyethylene glycol 17 g packet ?Commonly known as: MIRALAX / GLYCOLAX ?Take 17 g by mouth daily. ?  ?senna-docusate 8.6-50 MG tablet ?Commonly known as: Senokot-S ?Take 1 tablet by mouth at bedtime. ?  ? ?  ? ? ?No Known Allergies ? ?Consultations: ? ? ? ?Procedures/Studies: ?DG Chest 1 View ? ?Result Date: 08/05/2021 ?CLINICAL DATA:  Weakness  and recent falls. EXAM: CHEST  1 VIEW COMPARISON:  None. FINDINGS: Mild, diffuse, chronic appearing increased interstitial lung markings are seen. There is mild elevation of the left hemidiaphragm with very mild atelectasis noted along the left lung base. There is no evidence of focal consolidation, pleural effusion or pneumothorax. The heart size and mediastinal contours are within normal limits. There is mild calcification of the aortic arch. Multiple chronic left-sided rib fractures are seen. Multilevel degenerative changes are noted throughout the thoracic spine. IMPRESSION: 1. Chronic appearing increased interstitial lung markings without evidence of acute or active cardiopulmonary disease. 2. Multiple chronic left-sided rib fractures. Electronically Signed   By: Virgina Norfolk M.D.   On: 08/05/2021 21:16  ? ?DG Elbow Complete Right ? ?Result Date: 07/19/2021 ?CLINICAL DATA:  71 year old male with history of trauma from a fall complaining of right elbow pain. EXAM: RIGHT ELBOW - COMPLETE 3+ VIEW COMPARISON:  No  priors. FINDINGS: Three views of the right elbow demonstrate no definite acute displaced fracture, subluxation, dislocation, joint or soft tissue abnormality. IMPRESSION: 1. No acute radiographic abnormality of the right elbow. Electronically Signed   By: Vinnie Langton M.D.   On: 07/19/2021 09:03  ? ?DG Forearm Right ? ?Result Date: 07/19/2021 ?CLINICAL DATA:  71 year old male with history of trauma from a fall complaining of arm pain. EXAM: RIGHT FOREARM - 2 VIEW COMPARISON:  No priors. FINDINGS: There is an acute oblique minimally displaced fracture (1-2 mm) of the distal third of the ulnar diaphysis. Radius is intact. IMPRESSION: 1. Acute minimally displaced fracture of the distal third of the ulnar diaphysis. Electronically Signed   By: Vinnie Langton M.D.   On: 07/19/2021 09:02  ? ?CT HEAD WO CONTRAST (5MM) ? ?Result Date: 07/19/2021 ?CLINICAL DATA:  71 year old male with history of head  trauma. EXAM: CT HEAD WITHOUT CONTRAST CT CERVICAL SPINE WITHOUT CONTRAST TECHNIQUE: Multidetector CT imaging of the head and cervical spine was performed following the standard protocol without intravenous contrast. [place]

## 2021-08-10 NOTE — TOC Progression Note (Signed)
Transition of Care (TOC) - Progression Note  ? ? ?Patient Details  ?Name: Edward Thompson ?MRN: [id number] ?Date of Birth: [birth year] ? ?Transition of Care (TOC) CM/SW Contact  ?Danel Studzinski A Lillymae Duet, LCSW ?Phone Number: ?08/10/2021, 12:34 PM ? ?Clinical Narrative: Per [facility], [employer] called requesting a updated PT note, last note is 3/31.  Zadie Cleverly with PT to see.  ? ? ? ?Expected Discharge Plan: Home w Home Health Services ?Barriers to Discharge: Continued Medical Work up ? ?Expected Discharge Plan and Services ?Expected Discharge Plan: Home w Home Health Services ?  ?  ?  ?  ?Expected Discharge Date: 08/10/21               ?  ?  ?  ?  ?  ?  ?  ?  ?  ?  ? ? ?Social Determinants of Health (SDOH) Interventions ?  ? ?Readmission Risk Interventions ? ?  08/07/2021  ?  9:31 AM  ?Readmission Risk Prevention Plan  ?Transportation Screening Complete  ?PCP or Specialist Appt within 5-7 Days Complete  ?Home Care Screening Complete  ?Medication Review (RN CM) Complete  ? ? ?

## 2021-08-10 NOTE — Progress Notes (Signed)
Physical Therapy Treatment ?Patient Details ?Name: Edward Thompson ?MRN: [id number] ?DOB: 01-18-1951 ?Today's Date: 08/10/2021 ? ? ?History of Present Illness 71 y.o. male with medical history significant for CAD s/p stent, seizure disorder, HTN, sleep apnea, chronic urinary retention secondary to neurogenic bladder who self catheterizes, carotid artery stenosis s/p carotid endarterectomy who had a recent fall, sustaining fracture of the right arm who was brought the ED due to concerns that patient is unable to care for self.  Patient has been reportedly declining of late which led to his recent fall and has been very weak and has been having difficulty self catheterizing and taking care of himself.  He has had no cough or shortness of breath, vomiting, diarrhea, abdominal plain.  He has decreased appetite. ? ?  ?PT Comments  ? ? Pt received in bed, no c/o pain, anxious to get up and moving.  Discussed POC, pt states he performs transfers only at home and is not a big ambulator. Pt required MinA for bed mobility and transfers. He demonstrated good unsupported sitting balance at EOB x 4 minutes and was able to complete side stepping to [name] with MinA for weight shifting. Pt transferred to bed side recliner with CGA and positioned to comfort.  Pt continues to be a great candidate for short term rehab at SNF prior to returning home. ?  ?Recommendations for follow up therapy are one component of a multi-disciplinary discharge planning process, led by the attending physician.  Recommendations may be updated based on patient status, additional functional criteria and insurance authorization. ? ?Follow Up Recommendations ? Skilled nursing-short term rehab (<3 hours/day) ?  ?  ?Assistance Recommended at Discharge Frequent or constant Supervision/Assistance  ?Patient can return home with the following A little help with walking and/or transfers;A lot of help with bathing/dressing/bathroom;Assistance with cooking/housework;Assist  for transportation;Help with stairs or ramp for entrance;Direct supervision/assist for medications management ?  ?Equipment Recommendations ?    ?  ?Recommendations for Other Services   ? ? ?  ?Precautions / Restrictions Precautions ?Precautions: Fall ?Restrictions ?Weight Bearing Restrictions: Yes ?Other Position/Activity Restrictions:  (Restricted on R UE due to hard cast from previous injury)  ?  ? ?Mobility ? Bed Mobility ?Overal bed mobility: Needs Assistance ?Bed Mobility: Supine to Sit ?  ?  ?Supine to sit: Min guard ?  ?  ?  ?  ? ?Transfers ?Overall transfer level: Needs assistance ?Equipment used: 1 person hand held assist ?Transfers: Sit to/from Stand ?Sit to Stand: Min assist ?Stand pivot transfers: Min guard ?  ?  ?  ?  ?General transfer comment:  (Pt transfers well despite deficits, understands functional limitations) ?  ? ?Ambulation/Gait ?  ?  ?  ?  ?  ?  ?  ?General Gait Details:  (Pt performmed side stepping up towards head of bed ~4 feet with CGA for weight shifting.) ? ? ?Stairs ?  ?  ?  ?  ?  ? ? ?Wheelchair Mobility ?  ? ?Modified Rankin (Stroke Patients Only) ?  ? ? ?  ?Balance Overall balance assessment: Needs assistance ?Sitting-balance support: Single extremity supported ?Sitting balance-Leahy Scale: Good ?  ?Postural control: Left lateral lean ?Standing balance support: Single extremity supported ?Standing balance-Leahy Scale: Fair ?Standing balance comment:  (Assistance to weight shift) ?  ?  ?  ?  ?  ?  ?  ?  ?  ?  ?  ?  ? ?  ?Cognition Arousal/Alertness: Awake/alert ?Behavior During Therapy: Flat affect ?Overall  Cognitive Status: Within Functional Limits for tasks assessed ?  ?  ?  ?  ?  ?  ?  ?  ?  ?  ?  ?  ?  ?  ?  ?  ?General Comments: pleasant and cooperative but needing cuing for safety awareness ?  ?  ? ?  ?Exercises   ? ?  ?General Comments General comments (skin integrity, edema, etc.): Pt very motivated to return to prior level of function and appears to have good family  support. ?  ?  ? ?Pertinent Vitals/Pain Pain Assessment ?Pain Assessment: No/denies pain  ? ? ?Home Living   ?  ?  ?  ?  ?  ?  ?  ?  ?  ?   ?  ?Prior Function    ?  ?  ?   ? ?PT Goals (current goals can now be found in the care plan section) Acute Rehab PT Goals ?Patient Stated Goal: to go home ?Progress towards PT goals: Progressing toward goals ? ?  ?Frequency ? ? ? Min 2X/week ? ? ? ?  ?PT Plan Current plan remains appropriate  ? ? ?Co-evaluation   ?  ?  ?  ?  ? ?  ?AM-PAC PT "6 Clicks" Mobility   ?Outcome Measure ? Help needed turning from your back to your side while in a flat bed without using bedrails?: A Little ?Help needed moving from lying on your back to sitting on the side of a flat bed without using bedrails?: A Little ?Help needed moving to and from a bed to a chair (including a wheelchair)?: A Little ?Help needed standing up from a chair using your arms (e.g., wheelchair or bedside chair)?: A Little ?Help needed to walk in hospital room?: Total ?Help needed climbing 3-5 steps with a railing? : Total ?6 Click Score: 14 ? ?  ?End of Session Equipment Utilized During Treatment: Gait belt ?Activity Tolerance: Patient tolerated treatment well ?Patient left: with chair alarm set;in chair;with call bell/phone within reach ?Nurse Communication: Mobility status ?PT Visit Diagnosis: Unsteadiness on feet (R26.81);Repeated falls (R29.6);Muscle weakness (generalized) (M62.81);Difficulty in walking, not elsewhere classified (R26.2) ?  ? ? ?Time: 0867-6195 ?PT Time Calculation (min) (ACUTE ONLY): 23 min ? ?Charges:  $Therapeutic Activity: 23-37 mins          ?          ?Edward Thompson, PTA ? ? ? ?Edward Thompson ?08/10/2021, 1:03 PM ? ?

## 2021-08-11 DIAGNOSIS — I2510 Atherosclerotic heart disease of native coronary artery without angina pectoris: Secondary | ICD-10-CM | POA: Diagnosis not present

## 2021-08-11 DIAGNOSIS — Z7401 Bed confinement status: Secondary | ICD-10-CM | POA: Diagnosis not present

## 2021-08-11 DIAGNOSIS — W1789XD Other fall from one level to another, subsequent encounter: Secondary | ICD-10-CM | POA: Diagnosis not present

## 2021-08-11 DIAGNOSIS — R5381 Other malaise: Secondary | ICD-10-CM | POA: Diagnosis not present

## 2021-08-11 DIAGNOSIS — M6281 Muscle weakness (generalized): Secondary | ICD-10-CM | POA: Diagnosis not present

## 2021-08-11 DIAGNOSIS — S5291XS Unspecified fracture of right forearm, sequela: Secondary | ICD-10-CM | POA: Diagnosis not present

## 2021-08-11 DIAGNOSIS — S5290XD Unspecified fracture of unspecified forearm, subsequent encounter for closed fracture with routine healing: Secondary | ICD-10-CM | POA: Diagnosis not present

## 2021-08-11 DIAGNOSIS — J309 Allergic rhinitis, unspecified: Secondary | ICD-10-CM | POA: Diagnosis not present

## 2021-08-11 DIAGNOSIS — I10 Essential (primary) hypertension: Secondary | ICD-10-CM | POA: Diagnosis not present

## 2021-08-11 DIAGNOSIS — N390 Urinary tract infection, site not specified: Secondary | ICD-10-CM | POA: Diagnosis not present

## 2021-08-11 DIAGNOSIS — N319 Neuromuscular dysfunction of bladder, unspecified: Secondary | ICD-10-CM | POA: Diagnosis not present

## 2021-08-11 DIAGNOSIS — W19XXXA Unspecified fall, initial encounter: Secondary | ICD-10-CM | POA: Diagnosis not present

## 2021-08-11 DIAGNOSIS — Z87440 Personal history of urinary (tract) infections: Secondary | ICD-10-CM | POA: Diagnosis not present

## 2021-08-11 DIAGNOSIS — R627 Adult failure to thrive: Secondary | ICD-10-CM | POA: Diagnosis not present

## 2021-08-11 DIAGNOSIS — R339 Retention of urine, unspecified: Secondary | ICD-10-CM | POA: Diagnosis not present

## 2021-08-11 DIAGNOSIS — R7309 Other abnormal glucose: Secondary | ICD-10-CM | POA: Diagnosis not present

## 2021-08-11 DIAGNOSIS — S52264D Nondisplaced segmental fracture of shaft of ulna, right arm, subsequent encounter for closed fracture with routine healing: Secondary | ICD-10-CM | POA: Diagnosis not present

## 2021-08-11 LAB — GLUCOSE, CAPILLARY
Glucose-Capillary: 95 mg/dL (ref 70–99)
Glucose-Capillary: 104 mg/dL — ABNORMAL HIGH (ref 70–99)

## 2021-08-11 NOTE — TOC Progression Note (Addendum)
Transition of Care (TOC) - Progression Note  ? ? ?Patient Details  ?Name: [name] ?MRN: [id number] ?Date of Birth: 1950/12/12 ? ?Transition of Care (TOC) CM/SW Contact  ?Caryn Section, RN ?Phone Number: ?08/11/2021, 1:06 PM ? ?Clinical Narrative:   [facility]ing [facility] today ok per tina.  Transfer per ems ? ?Addendum 69 daughter aware of patient transfer to [facility] ? ? ? ?Expected Discharge Plan: Home w Home Health Services ?Barriers to Discharge: Continued Medical Work up ? ?Expected Discharge Plan and Services ?Expected Discharge Plan: Home w Home Health Services ?  ?  ?  ?  ?Expected Discharge Date: 08/11/21               ?  ?  ?  ?  ?  ?  ?  ?  ?  ?  ? ? ?Social Determinants of Health (SDOH) Interventions ?  ? ?Readmission Risk Interventions ? ?  08/07/2021  ?  9:31 AM  ?Readmission Risk Prevention Plan  ?Transportation Screening Complete  ?PCP or Specialist Appt within 5-7 Days Complete  ?Home Care Screening Complete  ?Medication Review (RN CM) Complete  ? ? ?

## 2021-08-11 NOTE — Progress Notes (Signed)
Patient being discharged to [facility] Room 802 for Rehab. Power line removed before discharge. Attempted to call [facility] to give report with no answer. All paperwork in discharge packet. Patient will be transferred via EMS. ?

## 2021-08-12 DIAGNOSIS — R627 Adult failure to thrive: Secondary | ICD-10-CM | POA: Diagnosis not present

## 2021-08-12 DIAGNOSIS — M6281 Muscle weakness (generalized): Secondary | ICD-10-CM | POA: Diagnosis not present

## 2021-08-12 DIAGNOSIS — N390 Urinary tract infection, site not specified: Secondary | ICD-10-CM | POA: Diagnosis not present

## 2021-08-12 DIAGNOSIS — N319 Neuromuscular dysfunction of bladder, unspecified: Secondary | ICD-10-CM | POA: Diagnosis not present

## 2021-08-13 DIAGNOSIS — M6281 Muscle weakness (generalized): Secondary | ICD-10-CM | POA: Diagnosis not present

## 2021-08-13 DIAGNOSIS — R627 Adult failure to thrive: Secondary | ICD-10-CM | POA: Diagnosis not present

## 2021-08-13 DIAGNOSIS — N390 Urinary tract infection, site not specified: Secondary | ICD-10-CM | POA: Diagnosis not present

## 2021-08-13 DIAGNOSIS — N319 Neuromuscular dysfunction of bladder, unspecified: Secondary | ICD-10-CM | POA: Diagnosis not present

## 2021-08-14 DIAGNOSIS — I10 Essential (primary) hypertension: Secondary | ICD-10-CM | POA: Diagnosis not present

## 2021-08-14 DIAGNOSIS — R7309 Other abnormal glucose: Secondary | ICD-10-CM | POA: Diagnosis not present

## 2021-08-18 DIAGNOSIS — M6281 Muscle weakness (generalized): Secondary | ICD-10-CM | POA: Diagnosis not present

## 2021-08-18 DIAGNOSIS — N319 Neuromuscular dysfunction of bladder, unspecified: Secondary | ICD-10-CM | POA: Diagnosis not present

## 2021-08-18 DIAGNOSIS — R627 Adult failure to thrive: Secondary | ICD-10-CM | POA: Diagnosis not present

## 2021-08-18 DIAGNOSIS — N390 Urinary tract infection, site not specified: Secondary | ICD-10-CM | POA: Diagnosis not present

## 2021-08-20 ENCOUNTER — Ambulatory Visit: Payer: Medicare HMO | Admitting: Urology

## 2021-08-20 DIAGNOSIS — S52264D Nondisplaced segmental fracture of shaft of ulna, right arm, subsequent encounter for closed fracture with routine healing: Secondary | ICD-10-CM | POA: Diagnosis not present

## 2021-08-21 ENCOUNTER — Encounter: Payer: Self-pay | Admitting: Urology

## 2021-08-21 ENCOUNTER — Ambulatory Visit: Payer: Medicare HMO | Admitting: Urology

## 2021-08-21 VITALS — BP 112/65 | HR 62

## 2021-08-21 DIAGNOSIS — S5290XD Unspecified fracture of unspecified forearm, subsequent encounter for closed fracture with routine healing: Secondary | ICD-10-CM | POA: Diagnosis not present

## 2021-08-21 DIAGNOSIS — R339 Retention of urine, unspecified: Secondary | ICD-10-CM

## 2021-08-21 DIAGNOSIS — M6281 Muscle weakness (generalized): Secondary | ICD-10-CM | POA: Diagnosis not present

## 2021-08-21 DIAGNOSIS — N390 Urinary tract infection, site not specified: Secondary | ICD-10-CM | POA: Diagnosis not present

## 2021-08-21 LAB — URINALYSIS, COMPLETE
Bilirubin, UA: NEGATIVE
Glucose, UA: NEGATIVE
Ketones, UA: NEGATIVE
Leukocytes,UA: NEGATIVE
Nitrite, UA: NEGATIVE
Protein,UA: NEGATIVE
RBC, UA: NEGATIVE
Specific Gravity, UA: 1.02 (ref 1.005–1.030)
Urobilinogen, Ur: 1 mg/dL (ref 0.2–1.0)
pH, UA: 7 (ref 5.0–7.5)

## 2021-08-21 LAB — MICROSCOPIC EXAMINATION: Bacteria, UA: NONE SEEN

## 2021-08-21 NOTE — Progress Notes (Signed)
? ?08/21/2021 ?3:26 PM  ? ?Edward Thompson ?[birth year] ?[id number] ? ?Referring provider: Jodi Marble, MD ?[place] ?[place],  [place] [address] ? ?Chief Complaint  ?Patient presents with  ? Urinary Tract Infection  ? Hospitalization Follow-up  ? ? ?Urologic history: ?1.  Urinary retention ?Cystoscopy with mild lateral lobe enlargement ?Urodynamic study with hyposensitive bladder and equivocal for obstruction ?Estimated procedure may be 50-60% effective ?Presently on CIC ? ? ?HPI: ?71 y.o. male presents for follow-up of recent hospitalization for UTI.  He presents today with his daughter. ? ? ?Hospitalized 3/29-08/10/2021 for concerns he was unable to care for self with progressive decline ?Urine culture showed moderate leukocytes and grew E. coli.  He was treated with antibiotics and discharged to skilled rehab ?He is catheterizing every 3 hours stating if he does not catheterize out often he has urinary incontinence.  He does relate to large volumes obtained when catheterizing though not measuring ?He brought in a urine today for analysis after catheterizing ? ? ?PMH: ?Past Medical History:  ?Diagnosis Date  ? CAD (coronary artery disease)   ? Carotid stenosis, left 08/2017  ? Diabetes mellitus without complication ([place])   ? Foot drop   ? Fracture of neck ([place]) 2008  ? fell off a roof. required halo x 4 months. also fractured alot of vertebrae  ? Heart attack ([place]) 2015  ? Heart disease   ? Hepatitis   ? 6th grade   ? Hyperlipidemia   ? Hypertension   ? Myocardial infarction acute ([place]) 2015  ? Seizures ([place])   ? taking phenobarbitol and dilantin. LAST SEIZURE WAS 2016. well controlled on meds  ? Sleep apnea   ? USES CPAP  ? Syncope 2019  ? Vertigo   ? Viral meningitis   ? ? ?Surgical History: ?Past Surgical History:  ?Procedure Laterality Date  ? CARDIAC SURGERY    ? COLONOSCOPY    ? COLONOSCOPY WITH PROPOFOL N/A 03/11/2021  ? Procedure: COLONOSCOPY WITH PROPOFOL;  Surgeon: Toledo, Benay Pike, MD;  Location:  [facility] ENDOSCOPY;  Service: Gastroenterology;  Laterality: N/A;  ? CORONARY ANGIOPLASTY    ? PATIENT UNAWARE OF THIS  ? CORONARY ARTERY BYPASS GRAFT  2015  ? ENDARTERECTOMY Left 08/19/2017  ? Procedure: ENDARTERECTOMY CAROTID;  Surgeon: Katha Cabal, MD;  Location: [facility] ORS;  Service: Vascular;  Laterality: Left;  ? ESOPHAGOGASTRODUODENOSCOPY N/A 03/11/2021  ? Procedure: ESOPHAGOGASTRODUODENOSCOPY (EGD);  Surgeon: Toledo, Benay Pike, MD;  Location: [facility] ENDOSCOPY;  Service: Gastroenterology;  Laterality: N/A;  DM  ? HERNIA REPAIR Right 1985  ? inguinal  ? ? ?Home Medications:  ?Allergies as of 08/21/2021   ?No Known Allergies ?  ? ?  ?Medication List  ?  ? ?  ? Accurate as of August 21, 2021  3:26 PM. If you have any questions, ask your nurse or doctor.  ?  ?  ? ?  ? ?Accu-Chek Guide test strip ?Generic drug: glucose blood ?  ?Accu-Chek Guide w/Device Kit ?  ?Accu-Chek Softclix Lancets lancets ?  ?acetaminophen 500 MG tablet ?Commonly known as: TYLENOL ?Take 500 mg by mouth every 6 (six) hours as needed. ?  ?amLODipine 5 MG tablet ?Commonly known as: NORVASC ?Take 0.5 tablets (2.5 mg total) by mouth daily. ?  ?aspirin EC 81 MG tablet ?Take 81 mg by mouth at bedtime. ?  ?escitalopram 10 MG tablet ?Commonly known as: LEXAPRO ?Take 10 mg by mouth daily. ?  ?feeding supplement (GLUCERNA SHAKE) Liqd ?Take 237 mLs by  mouth 2 (two) times daily between meals. ?  ?meclizine 25 MG tablet ?Commonly known as: ANTIVERT ?Take 50 mg by mouth 2 (two) times daily as needed for dizziness. ?  ?metFORMIN 500 MG tablet ?Commonly known as: GLUCOPHAGE ?Take 500 mg by mouth daily. ?  ?metoprolol succinate 25 MG 24 hr tablet ?Commonly known as: TOPROL-XL ?Take 0.5 tablets (12.5 mg total) by mouth daily. ?  ?multivitamin tablet ?Take 1 tablet by mouth daily. ?  ?nitroGLYCERIN 0.4 MG SL tablet ?Commonly known as: NITROSTAT ?Place 0.4 mg under the tongue every 5 (five) minutes as needed for chest pain. ?  ?pantoprazole 40 MG tablet ?Commonly  known as: PROTONIX ?Take 40 mg by mouth daily. ?  ?PHENobarbital 64.8 MG tablet ?Commonly known as: LUMINAL ?Take 2 tablets (129.6 mg total) by mouth at bedtime. ?  ?phenytoin 100 MG ER capsule ?Commonly known as: DILANTIN ?Take 300 mg by mouth at bedtime. ?  ?polyethylene glycol 17 g packet ?Commonly known as: MIRALAX / GLYCOLAX ?Take 17 g by mouth daily. ?  ?PROSTAT PO ?Take 30 mLs by mouth daily. ?  ?senna-docusate 8.6-50 MG tablet ?Commonly known as: Senokot-S ?Take 1 tablet by mouth at bedtime. ?  ? ?  ? ? ?Allergies: No Known Allergies ? ?Family History: ?Family History  ?Problem Relation Age of Onset  ? Heart disease Mother   ? Heart attack Father   ? ? ?Social History:  reports that he has never smoked. He has never used smokeless tobacco. He reports that he does not drink alcohol and does not use drugs. ? ? ?Physical Exam: ?BP 112/65 (BP Location: Left Arm, Patient Position: Sitting, Cuff Size: Normal)   Pulse 62   ?Constitutional:  Alert, no acute distress. ?HEENT: [place] AT, moist mucus membranes.  Trachea midline, no masses. ?Respiratory: Normal respiratory effort, no increased work of breathing. ? ? ?Laboratory Data: ? ?Urinalysis ?Dipstick/microscopy negative ? ? ? ?Assessment & Plan:   ? ?1.  Urinary retention ?Discussed having to catheterize every 3 hours it may be better to place an indwelling Foley catheter however he declined ?UA today clear ?Goal would be to decrease catheterizations to no more than 4 times daily ?6 month follow-up and to call earlier for any problems ? ? ?Abbie Sons, MD ? ?[facility] ?[address], [address] ?[place], [place] [address] ?[phone] ? ?

## 2021-08-25 DIAGNOSIS — W1789XD Other fall from one level to another, subsequent encounter: Secondary | ICD-10-CM | POA: Diagnosis not present

## 2021-08-25 DIAGNOSIS — S5290XD Unspecified fracture of unspecified forearm, subsequent encounter for closed fracture with routine healing: Secondary | ICD-10-CM | POA: Diagnosis not present

## 2021-08-25 DIAGNOSIS — M6281 Muscle weakness (generalized): Secondary | ICD-10-CM | POA: Diagnosis not present

## 2021-08-27 DIAGNOSIS — S5290XD Unspecified fracture of unspecified forearm, subsequent encounter for closed fracture with routine healing: Secondary | ICD-10-CM | POA: Diagnosis not present

## 2021-08-27 DIAGNOSIS — M6281 Muscle weakness (generalized): Secondary | ICD-10-CM | POA: Diagnosis not present

## 2021-08-27 DIAGNOSIS — J309 Allergic rhinitis, unspecified: Secondary | ICD-10-CM | POA: Diagnosis not present

## 2021-09-01 DIAGNOSIS — J309 Allergic rhinitis, unspecified: Secondary | ICD-10-CM | POA: Diagnosis not present

## 2021-09-01 DIAGNOSIS — M6281 Muscle weakness (generalized): Secondary | ICD-10-CM | POA: Diagnosis not present

## 2021-09-01 DIAGNOSIS — S5290XD Unspecified fracture of unspecified forearm, subsequent encounter for closed fracture with routine healing: Secondary | ICD-10-CM | POA: Diagnosis not present

## 2021-09-03 DIAGNOSIS — S52264D Nondisplaced segmental fracture of shaft of ulna, right arm, subsequent encounter for closed fracture with routine healing: Secondary | ICD-10-CM | POA: Diagnosis not present

## 2021-09-04 DIAGNOSIS — J309 Allergic rhinitis, unspecified: Secondary | ICD-10-CM | POA: Diagnosis not present

## 2021-09-04 DIAGNOSIS — N390 Urinary tract infection, site not specified: Secondary | ICD-10-CM | POA: Diagnosis not present

## 2021-09-04 DIAGNOSIS — S5290XD Unspecified fracture of unspecified forearm, subsequent encounter for closed fracture with routine healing: Secondary | ICD-10-CM | POA: Diagnosis not present

## 2021-09-04 DIAGNOSIS — M6281 Muscle weakness (generalized): Secondary | ICD-10-CM | POA: Diagnosis not present

## 2021-09-18 ENCOUNTER — Emergency Department: Payer: Medicare HMO

## 2021-09-18 ENCOUNTER — Other Ambulatory Visit: Payer: Self-pay

## 2021-09-18 ENCOUNTER — Inpatient Hospital Stay
Admission: EM | Admit: 2021-09-18 | Discharge: 2021-09-23 | DRG: 698 | Disposition: A | Discharge status: Home Health | Payer: Medicare HMO | Attending: Internal Medicine | Admitting: Internal Medicine

## 2021-09-18 DIAGNOSIS — N12 Tubulo-interstitial nephritis, not specified as acute or chronic: Secondary | ICD-10-CM | POA: Diagnosis not present

## 2021-09-18 DIAGNOSIS — Z8619 Personal history of other infectious and parasitic diseases: Secondary | ICD-10-CM

## 2021-09-18 DIAGNOSIS — Y92009 Unspecified place in unspecified non-institutional (private) residence as the place of occurrence of the external cause: Secondary | ICD-10-CM

## 2021-09-18 DIAGNOSIS — T83518A Infection and inflammatory reaction due to other urinary catheter, initial encounter: Secondary | ICD-10-CM | POA: Diagnosis not present

## 2021-09-18 DIAGNOSIS — W1830XA Fall on same level, unspecified, initial encounter: Secondary | ICD-10-CM | POA: Diagnosis present

## 2021-09-18 DIAGNOSIS — K5900 Constipation, unspecified: Secondary | ICD-10-CM | POA: Diagnosis present

## 2021-09-18 DIAGNOSIS — G40909 Epilepsy, unspecified, not intractable, without status epilepticus: Secondary | ICD-10-CM | POA: Diagnosis present

## 2021-09-18 DIAGNOSIS — I10 Essential (primary) hypertension: Secondary | ICD-10-CM | POA: Diagnosis not present

## 2021-09-18 DIAGNOSIS — Y848 Other medical procedures as the cause of abnormal reaction of the patient, or of later complication, without mention of misadventure at the time of the procedure: Secondary | ICD-10-CM | POA: Diagnosis present

## 2021-09-18 DIAGNOSIS — I2510 Atherosclerotic heart disease of native coronary artery without angina pectoris: Secondary | ICD-10-CM | POA: Diagnosis present

## 2021-09-18 DIAGNOSIS — Z951 Presence of aortocoronary bypass graft: Secondary | ICD-10-CM

## 2021-09-18 DIAGNOSIS — Z7984 Long term (current) use of oral hypoglycemic drugs: Secondary | ICD-10-CM | POA: Diagnosis not present

## 2021-09-18 DIAGNOSIS — G4730 Sleep apnea, unspecified: Secondary | ICD-10-CM | POA: Diagnosis present

## 2021-09-18 DIAGNOSIS — Z7982 Long term (current) use of aspirin: Secondary | ICD-10-CM | POA: Diagnosis not present

## 2021-09-18 DIAGNOSIS — E785 Hyperlipidemia, unspecified: Secondary | ICD-10-CM | POA: Diagnosis not present

## 2021-09-18 DIAGNOSIS — G244 Idiopathic orofacial dystonia: Secondary | ICD-10-CM | POA: Diagnosis present

## 2021-09-18 DIAGNOSIS — I252 Old myocardial infarction: Secondary | ICD-10-CM

## 2021-09-18 DIAGNOSIS — N390 Urinary tract infection, site not specified: Secondary | ICD-10-CM | POA: Diagnosis not present

## 2021-09-18 DIAGNOSIS — R7881 Bacteremia: Secondary | ICD-10-CM

## 2021-09-18 DIAGNOSIS — A4151 Sepsis due to Escherichia coli [E. coli]: Secondary | ICD-10-CM | POA: Diagnosis not present

## 2021-09-18 DIAGNOSIS — A419 Sepsis, unspecified organism: Secondary | ICD-10-CM | POA: Diagnosis not present

## 2021-09-18 DIAGNOSIS — Z20822 Contact with and (suspected) exposure to covid-19: Secondary | ICD-10-CM | POA: Diagnosis present

## 2021-09-18 DIAGNOSIS — N319 Neuromuscular dysfunction of bladder, unspecified: Secondary | ICD-10-CM | POA: Diagnosis present

## 2021-09-18 DIAGNOSIS — R338 Other retention of urine: Secondary | ICD-10-CM | POA: Diagnosis present

## 2021-09-18 DIAGNOSIS — Z955 Presence of coronary angioplasty implant and graft: Secondary | ICD-10-CM

## 2021-09-18 DIAGNOSIS — E119 Type 2 diabetes mellitus without complications: Secondary | ICD-10-CM | POA: Diagnosis not present

## 2021-09-18 DIAGNOSIS — N10 Acute tubulo-interstitial nephritis: Secondary | ICD-10-CM | POA: Diagnosis not present

## 2021-09-18 DIAGNOSIS — N200 Calculus of kidney: Secondary | ICD-10-CM | POA: Diagnosis not present

## 2021-09-18 DIAGNOSIS — Z6825 Body mass index (BMI) 25.0-25.9, adult: Secondary | ICD-10-CM | POA: Diagnosis not present

## 2021-09-18 DIAGNOSIS — R531 Weakness: Secondary | ICD-10-CM | POA: Diagnosis not present

## 2021-09-18 DIAGNOSIS — R140 Abdominal distension (gaseous): Secondary | ICD-10-CM | POA: Diagnosis not present

## 2021-09-18 DIAGNOSIS — M47812 Spondylosis without myelopathy or radiculopathy, cervical region: Secondary | ICD-10-CM | POA: Diagnosis not present

## 2021-09-18 DIAGNOSIS — Z79899 Other long term (current) drug therapy: Secondary | ICD-10-CM

## 2021-09-18 DIAGNOSIS — R509 Fever, unspecified: Secondary | ICD-10-CM | POA: Diagnosis not present

## 2021-09-18 DIAGNOSIS — E663 Overweight: Secondary | ICD-10-CM | POA: Diagnosis present

## 2021-09-18 DIAGNOSIS — J9811 Atelectasis: Secondary | ICD-10-CM | POA: Diagnosis not present

## 2021-09-18 DIAGNOSIS — I700 Atherosclerosis of aorta: Secondary | ICD-10-CM | POA: Diagnosis present

## 2021-09-18 DIAGNOSIS — S0990XA Unspecified injury of head, initial encounter: Secondary | ICD-10-CM | POA: Diagnosis not present

## 2021-09-18 DIAGNOSIS — W19XXXA Unspecified fall, initial encounter: Secondary | ICD-10-CM

## 2021-09-18 DIAGNOSIS — Z87440 Personal history of urinary (tract) infections: Secondary | ICD-10-CM | POA: Diagnosis not present

## 2021-09-18 DIAGNOSIS — Z8249 Family history of ischemic heart disease and other diseases of the circulatory system: Secondary | ICD-10-CM | POA: Diagnosis not present

## 2021-09-18 LAB — CBC WITH DIFFERENTIAL/PLATELET
Abs Immature Granulocytes: 0.1 10*3/uL — ABNORMAL HIGH (ref 0.00–0.07)
Basophils Absolute: 0 10*3/uL (ref 0.0–0.1)
Basophils Relative: 0 %
Eosinophils Absolute: 0 10*3/uL (ref 0.0–0.5)
Eosinophils Relative: 0 %
HCT: 36.7 % — ABNORMAL LOW (ref 39.0–52.0)
Hemoglobin: 12.1 g/dL — ABNORMAL LOW (ref 13.0–17.0)
Immature Granulocytes: 1 %
Lymphocytes Relative: 7 %
Lymphs Abs: 1.1 10*3/uL (ref 0.7–4.0)
MCH: 31.1 pg (ref 26.0–34.0)
MCHC: 33 g/dL (ref 30.0–36.0)
MCV: 94.3 fL (ref 80.0–100.0)
Monocytes Absolute: 1.2 10*3/uL — ABNORMAL HIGH (ref 0.1–1.0)
Monocytes Relative: 8 %
Neutro Abs: 12.3 10*3/uL — ABNORMAL HIGH (ref 1.7–7.7)
Neutrophils Relative %: 84 %
Platelets: 174 10*3/uL (ref 150–400)
RBC: 3.89 MIL/uL — ABNORMAL LOW (ref 4.22–5.81)
RDW: 13.2 % (ref 11.5–15.5)
WBC: 14.7 10*3/uL — ABNORMAL HIGH (ref 4.0–10.5)
nRBC: 0 % (ref 0.0–0.2)

## 2021-09-18 LAB — PROTIME-INR
INR: 1.5 — ABNORMAL HIGH (ref 0.8–1.2)
Prothrombin Time: 18.3 seconds — ABNORMAL HIGH (ref 11.4–15.2)

## 2021-09-18 LAB — URINALYSIS, ROUTINE W REFLEX MICROSCOPIC
Bilirubin Urine: NEGATIVE
Glucose, UA: NEGATIVE mg/dL
Ketones, ur: 80 mg/dL — AB
Nitrite: NEGATIVE
Protein, ur: 100 mg/dL — AB
Specific Gravity, Urine: 1.013 (ref 1.005–1.030)
Squamous Epithelial / HPF: NONE SEEN (ref 0–5)
WBC, UA: >50 WBC/hpf — ABNORMAL HIGH (ref 0–5)
pH: 5 (ref 5.0–8.0)

## 2021-09-18 LAB — COMPREHENSIVE METABOLIC PANEL
ALT: 24 U/L (ref 0–44)
AST: 32 U/L (ref 15–41)
Albumin: 3.8 g/dL (ref 3.5–5.0)
Alkaline Phosphatase: 102 U/L (ref 38–126)
Anion gap: 12 (ref 5–15)
BUN: 20 mg/dL (ref 8–23)
CO2: 23 mmol/L (ref 22–32)
Calcium: 8.7 mg/dL — ABNORMAL LOW (ref 8.9–10.3)
Chloride: 100 mmol/L (ref 98–111)
Creatinine, Ser: 0.74 mg/dL (ref 0.61–1.24)
GFR, Estimated: >60 mL/min (ref >60)
Glucose, Bld: 146 mg/dL — ABNORMAL HIGH (ref 70–99)
Potassium: 3.7 mmol/L (ref 3.5–5.1)
Sodium: 135 mmol/L (ref 135–145)
Total Bilirubin: 0.7 mg/dL (ref 0.3–1.2)
Total Protein: 7.7 g/dL (ref 6.5–8.1)

## 2021-09-18 LAB — LACTIC ACID, PLASMA
Lactic Acid, Venous: 0.9 mmol/L (ref 0.5–1.9)
Lactic Acid, Venous: 0.8 mmol/L (ref 0.5–1.9)

## 2021-09-18 LAB — CBC
HCT: 32 % — ABNORMAL LOW (ref 39.0–52.0)
Hemoglobin: 10.6 g/dL — ABNORMAL LOW (ref 13.0–17.0)
MCH: 30.7 pg (ref 26.0–34.0)
MCHC: 33.1 g/dL (ref 30.0–36.0)
MCV: 92.8 fL (ref 80.0–100.0)
Platelets: 133 10*3/uL — ABNORMAL LOW (ref 150–400)
RBC: 3.45 MIL/uL — ABNORMAL LOW (ref 4.22–5.81)
RDW: 13.1 % (ref 11.5–15.5)
WBC: 12.3 10*3/uL — ABNORMAL HIGH (ref 4.0–10.5)
nRBC: 0 % (ref 0.0–0.2)

## 2021-09-18 LAB — RESP PANEL BY RT-PCR (FLU A&B, COVID) ARPGX2
Influenza A by PCR: NEGATIVE
Influenza B by PCR: NEGATIVE
SARS Coronavirus 2 by RT PCR: NEGATIVE

## 2021-09-18 LAB — CREATININE, SERUM
Creatinine, Ser: 0.78 mg/dL (ref 0.61–1.24)
GFR, Estimated: >60 mL/min (ref >60)

## 2021-09-18 LAB — GLUCOSE, CAPILLARY: Glucose-Capillary: 127 mg/dL — ABNORMAL HIGH (ref 70–99)

## 2021-09-18 LAB — TROPONIN I (HIGH SENSITIVITY): Troponin I (High Sensitivity): 75 ng/L — ABNORMAL HIGH (ref <18)

## 2021-09-18 MED ORDER — GLUCERNA SHAKE PO LIQD
237.0000 mL | Freq: Two times a day (BID) | ORAL | Status: DC
  Administered 2021-09-19 – 2021-09-23 (×10): 237 mL via ORAL

## 2021-09-18 MED ORDER — INSULIN ASPART 100 UNIT/ML IJ SOLN: 0.0000 [IU] | Freq: Every day | INTRAMUSCULAR | Status: DC

## 2021-09-18 MED ORDER — SODIUM CHLORIDE 0.9 % IV BOLUS
1000.0000 mL | Freq: Once | INTRAVENOUS | Status: AC
  Administered 2021-09-18: 1000 mL via INTRAVENOUS

## 2021-09-18 MED ORDER — ESCITALOPRAM OXALATE 10 MG PO TABS
10.0000 mg | ORAL_TABLET | Freq: Every day | ORAL | Status: DC
  Administered 2021-09-19 – 2021-09-23 (×5): 10 mg via ORAL
  Filled 2021-09-18 (×5): qty 1

## 2021-09-18 MED ORDER — POLYETHYLENE GLYCOL 3350 17 G PO PACK
17.0000 g | PACK | Freq: Every day | ORAL | Status: DC
  Administered 2021-09-19 – 2021-09-22 (×3): 17 g via ORAL
  Filled 2021-09-18 (×4): qty 1

## 2021-09-18 MED ORDER — HYDROCODONE-ACETAMINOPHEN 5-325 MG PO TABS: 1.0000 | ORAL_TABLET | ORAL | Status: DC | PRN

## 2021-09-18 MED ORDER — LACTATED RINGERS IV SOLN: INTRAVENOUS | Status: AC

## 2021-09-18 MED ORDER — ACETAMINOPHEN 325 MG PO TABS
650.0000 mg | ORAL_TABLET | Freq: Four times a day (QID) | ORAL | Status: DC | PRN
  Administered 2021-09-20: 650 mg via ORAL
  Filled 2021-09-18: qty 2

## 2021-09-18 MED ORDER — ACETAMINOPHEN 650 MG RE SUPP: 650.0000 mg | Freq: Four times a day (QID) | RECTAL | Status: DC | PRN

## 2021-09-18 MED ORDER — METOPROLOL SUCCINATE ER 25 MG PO TB24
12.5000 mg | ORAL_TABLET | Freq: Every day | ORAL | Status: DC
  Administered 2021-09-19 – 2021-09-23 (×5): 12.5 mg via ORAL
  Filled 2021-09-18 (×5): qty 1

## 2021-09-18 MED ORDER — ONDANSETRON HCL 4 MG/2ML IJ SOLN: 4.0000 mg | Freq: Four times a day (QID) | INTRAMUSCULAR | Status: DC | PRN

## 2021-09-18 MED ORDER — AMLODIPINE BESYLATE 5 MG PO TABS: 2.5000 mg | ORAL_TABLET | Freq: Every day | ORAL | Status: DC

## 2021-09-18 MED ORDER — INSULIN ASPART 100 UNIT/ML IJ SOLN: 0.0000 [IU] | Freq: Three times a day (TID) | INTRAMUSCULAR | Status: DC

## 2021-09-18 MED ORDER — PIPERACILLIN-TAZOBACTAM 3.375 G IVPB
3.3750 g | Freq: Three times a day (TID) | INTRAVENOUS | Status: DC
  Administered 2021-09-19: 3.375 g via INTRAVENOUS
  Filled 2021-09-18: qty 50

## 2021-09-18 MED ORDER — SENNOSIDES-DOCUSATE SODIUM 8.6-50 MG PO TABS
1.0000 | ORAL_TABLET | Freq: Every day | ORAL | Status: DC
  Administered 2021-09-19 – 2021-09-22 (×5): 1 via ORAL
  Filled 2021-09-18 (×5): qty 1

## 2021-09-18 MED ORDER — PIPERACILLIN-TAZOBACTAM 3.375 G IVPB 30 MIN
3.3750 g | Freq: Once | INTRAVENOUS | Status: AC
  Administered 2021-09-18: 3.375 g via INTRAVENOUS
  Filled 2021-09-18: qty 50

## 2021-09-18 MED ORDER — NITROGLYCERIN 0.4 MG SL SUBL: 0.4000 mg | SUBLINGUAL_TABLET | SUBLINGUAL | Status: DC | PRN

## 2021-09-18 MED ORDER — ENOXAPARIN SODIUM 40 MG/0.4ML IJ SOSY
40.0000 mg | PREFILLED_SYRINGE | INTRAMUSCULAR | Status: DC
  Administered 2021-09-19 – 2021-09-22 (×5): 40 mg via SUBCUTANEOUS
  Filled 2021-09-18 (×5): qty 0.4

## 2021-09-18 MED ORDER — PHENYTOIN SODIUM EXTENDED 100 MG PO CAPS
300.0000 mg | ORAL_CAPSULE | Freq: Every day | ORAL | Status: DC
  Administered 2021-09-19 – 2021-09-22 (×5): 300 mg via ORAL
  Filled 2021-09-18 (×5): qty 3

## 2021-09-18 MED ORDER — ONDANSETRON HCL 4 MG PO TABS: 4.0000 mg | ORAL_TABLET | Freq: Four times a day (QID) | ORAL | Status: DC | PRN

## 2021-09-18 MED ORDER — PANTOPRAZOLE SODIUM 40 MG PO TBEC
40.0000 mg | DELAYED_RELEASE_TABLET | Freq: Every day | ORAL | Status: DC
  Administered 2021-09-19 – 2021-09-23 (×5): 40 mg via ORAL
  Filled 2021-09-18 (×5): qty 1

## 2021-09-18 MED ORDER — ACETAMINOPHEN 500 MG PO TABS
1000.0000 mg | ORAL_TABLET | Freq: Once | ORAL | Status: AC
Start: 2021-09-18 — End: 2021-09-18
  Administered 2021-09-18: 1000 mg via ORAL
  Filled 2021-09-18: qty 2

## 2021-09-18 MED ORDER — PHENOBARBITAL 32.4 MG PO TABS
129.6000 mg | ORAL_TABLET | Freq: Every day | ORAL | Status: DC
  Administered 2021-09-19 – 2021-09-22 (×5): 129.6 mg via ORAL
  Filled 2021-09-18 (×5): qty 4

## 2021-09-18 NOTE — ED Notes (Signed)
Pt return from CT, NAD noted, daughter at the bedside ?

## 2021-09-18 NOTE — Consult Note (Signed)
Pharmacy Antibiotic Note ? ?Edward Thompson is a 71 y.o. male admitted on 09/18/2021 with sepsis.  Pharmacy has been consulted for pip/tazo dosing. ? ?Plan: ?Zosyn 3.375g IV q8h (4 hour infusion). ? ?Height: 5\' 4"  (162.6 cm) ?Weight: 70.3 kg (155 lb) ?IBW/kg (Calculated) : 59.2 ? ?Temp (24hrs), Avg:100.5 ?F (38.1 ?C), Min:99.9 ?F (37.7 ?C), Max:101.5 ?F (38.6 ?C) ? ?Recent Labs  ?Lab 09/18/21 ?1656 09/18/21 ?2057  ?WBC 14.7*  --   ?CREATININE 0.74  --   ?LATICACIDVEN 0.9 0.8  ?  ?Estimated Creatinine Clearance: 70.9 mL/min (by C-G formula based on SCr of 0.74 mg/dL).   ? ?No Known Allergies ? ?Antimicrobials this admission: ?5/12 pip/tazo >>  ? ? ?Dose adjustments this admission: ?None ? ?Microbiology results: ?5/12 BCx: pending ?5/12 UCx: pending  ? ?Thank you for allowing pharmacy to be a part of this patient?s care. ? ?7/12, PharmD, BCPS ?09/18/2021 11:06 PM ? ?

## 2021-09-18 NOTE — ED Notes (Signed)
Have message inpatient RN Marylin Crosby who will be receiving pt, pt bed is ready for admission  ?

## 2021-09-18 NOTE — Assessment & Plan Note (Addendum)
No complaints of chest pain.  ?--cont home ASA ?

## 2021-09-18 NOTE — Assessment & Plan Note (Addendum)
Hold metformin.   ?--d/c'ed BG checks and SSI ?

## 2021-09-18 NOTE — ED Triage Notes (Signed)
Pt here with a fever and weakness for 2 days. Pt had a fall yesterday and hit his head, small knot on forehead. Pt's temp at home was 101.1. Pt has hx of sepsis from a UTI in the past. Pt denies pain, N,V, D. ?

## 2021-09-18 NOTE — H&P (Signed)
?History and Physical  ? ? ?Patient: Edward Thompson MRN:[id number] DOB: 11/15/1950 ?DOA: 09/18/2021 ?DOS: the patient was seen and examined on 09/18/2021 ?PCP: Tejan-Sie, S Ahmed, MD  ?Patient coming from: Home ? ?Chief Complaint:  ?Chief Complaint  ?Patient presents with  ? Fever  ? ? ?HPI: Edward Thompson is a 70 y.o. male with medical history significant for CAD s/p stent, seizure disorder, HTN, neurogenic bladder who self catheterizes, recently hospitalized from 3/29 to 4/4 with ESBL E. coli UTI, who presents to the ED with weakness resulting in a fall in which she hit his head without loss of consciousness and without other injury.  Patient had a similar presentation with weakness and a fall at his last hospitalization. ?ED course and data review: Tmax 100 with otherwise normal vitals.  WBC 15,000, hemoglobin 12, lactic acid 0.8, CMP unremarkable, INR 1.5, COVID and flu negative, urinalysis with large leukocyte esterase WBC greater than 50 and many bacteria.  EKG, personally viewed and interpreted showing NSR at 94 with nonspecific ST-T wave changes.  CT head and C-spine nonacute CT abdomen and pelvis showing the following: ?IMPRESSION: ?1. Findings concerning for right-sided pyelonephritis. Please ?correlate with urinalysis. ?2. Multiple punctate nonobstructing bilateral renal calculi. ?3. Moderate retained stool throughout the colon consistent with ?constipation. No obstruction or ileus. ?Patient given an IV fluid bolus per sepsis protocol, started on Zosyn based on past sensitivities.  Hospitalist consulted for admission for sepsis secondary to pyelonephritis.  ? ?Review of Systems: As mentioned in the history of present illness. All other systems reviewed and are negative. ?Past Medical History:  ?Diagnosis Date  ? CAD (coronary artery disease)   ? Carotid stenosis, left 08/2017  ? Diabetes mellitus without complication (HCC)   ? Foot drop   ? Fracture of neck (HCC) 2008  ? fell off a roof. required halo x 4  months. also fractured alot of vertebrae  ? Heart attack (HCC) 2015  ? Heart disease   ? Hepatitis   ? 6th grade   ? Hyperlipidemia   ? Hypertension   ? Myocardial infarction acute (HCC) 2015  ? Seizures (HCC)   ? taking phenobarbitol and dilantin. LAST SEIZURE WAS 2016. well controlled on meds  ? Sleep apnea   ? USES CPAP  ? Syncope 2019  ? Vertigo   ? Viral meningitis   ? ?Past Surgical History:  ?Procedure Laterality Date  ? CARDIAC SURGERY    ? COLONOSCOPY    ? COLONOSCOPY WITH PROPOFOL N/A 03/11/2021  ? Procedure: COLONOSCOPY WITH PROPOFOL;  Surgeon: Toledo, Teodoro K, MD;  Location: [facility] ENDOSCOPY;  Service: Gastroenterology;  Laterality: N/A;  ? CORONARY ANGIOPLASTY    ? PATIENT UNAWARE OF THIS  ? CORONARY ARTERY BYPASS GRAFT  2015  ? ENDARTERECTOMY Left 08/19/2017  ? Procedure: ENDARTERECTOMY CAROTID;  Surgeon: Schnier, Gregory G, MD;  Location: [facility] ORS;  Service: Vascular;  Laterality: Left;  ? ESOPHAGOGASTRODUODENOSCOPY N/A 03/11/2021  ? Procedure: ESOPHAGOGASTRODUODENOSCOPY (EGD);  Surgeon: Toledo, Teodoro K, MD;  Location: [facility] ENDOSCOPY;  Service: Gastroenterology;  Laterality: N/A;  DM  ? HERNIA REPAIR Right 1985  ? inguinal  ? ?Social History:  reports that he has never smoked. He has never used smokeless tobacco. He reports that he does not drink alcohol and does not use drugs. ? ?No Known Allergies ? ?Family History  ?Problem Relation Age of Onset  ? Heart disease Mother   ? Heart attack Father   ? ? ?Prior to Admission medications   ?Medication   Sig Start Date End Date Taking? Authorizing Provider  ?ACCU-CHEK GUIDE test strip  04/24/21   [provider]  ?Accu-Chek Softclix Lancets lancets  04/24/21   [provider]  ?acetaminophen (TYLENOL) 500 MG tablet Take 500 mg by mouth every 6 (six) hours as needed.    [provider]  ?amLODipine (NORVASC) 5 MG tablet Take 0.5 tablets (2.5 mg total) by mouth daily. 08/10/21   Nolberto Hanlon, MD  ?aspirin EC 81 MG tablet Take 81 mg by  mouth at bedtime.    [provider]  ?Blood Glucose Monitoring Suppl (ACCU-CHEK GUIDE) w/Device KIT  04/24/21   [provider]  ?escitalopram (LEXAPRO) 10 MG tablet Take 10 mg by mouth daily. 06/30/21   [provider]  ?feeding supplement, GLUCERNA SHAKE, (GLUCERNA SHAKE) LIQD Take 237 mLs by mouth 2 (two) times daily between meals. 12/13/20   Lorella Nimrod, MD  ?meclizine (ANTIVERT) 25 MG tablet Take 50 mg by mouth 2 (two) times daily as needed for dizziness.  05/27/14   [provider]  ?metFORMIN (GLUCOPHAGE) 500 MG tablet Take 500 mg by mouth daily.  04/28/17   [provider]  ?metoprolol succinate (TOPROL-XL) 25 MG 24 hr tablet Take 0.5 tablets (12.5 mg total) by mouth daily. 12/13/20   Lorella Nimrod, MD  ?Multiple Vitamin (MULTIVITAMIN) tablet Take 1 tablet by mouth daily.    [provider]  ?nitroGLYCERIN (NITROSTAT) 0.4 MG SL tablet Place 0.4 mg under the tongue every 5 (five) minutes as needed for chest pain.  08/22/13   [provider]  ?pantoprazole (PROTONIX) 40 MG tablet Take 40 mg by mouth daily. 03/31/21   [provider]  ?PHENobarbital (LUMINAL) 64.8 MG tablet Take 2 tablets (129.6 mg total) by mouth at bedtime. 08/25/17   Latanya Maudlin, NP  ?phenytoin (DILANTIN) 100 MG ER capsule Take 300 mg by mouth at bedtime.     [provider]  ?Pollen Extracts (PROSTAT PO) Take 30 mLs by mouth daily.    [provider]  ?polyethylene glycol (MIRALAX / GLYCOLAX) 17 g packet Take 17 g by mouth daily. 10/11/20   Annita Brod, MD  ?senna-docusate (SENOKOT-S) 8.6-50 MG tablet Take 1 tablet by mouth at bedtime. 08/10/21   Nolberto Hanlon, MD  ? ? ?Physical Exam: ?Vitals:  ? 09/18/21 2129 09/18/21 2130 09/18/21 2200 09/18/21 2230  ?BP:  136/62 (!) 125/55 (!) 127/57  ?Pulse:  94 93 93  ?Resp:  20 (!) 22 17  ?Temp: 99.9 ?F (37.7 ?C)     ?TempSrc: Oral     ?SpO2:  100% 100% 100%  ?Weight:      ?Height:      ? ?Physical  Exam ?Vitals and nursing note reviewed.  ?Constitutional:   ?   General: He is not in acute distress. ?   Comments: Orofacial dyskinesia  ?HENT:  ?   Head: Normocephalic and atraumatic.  ?Cardiovascular:  ?   Rate and Rhythm: Normal rate and regular rhythm.  ?   Pulses: Normal pulses.  ?   Heart sounds: Normal heart sounds.  ?Pulmonary:  ?   Effort: Pulmonary effort is normal.  ?   Breath sounds: Normal breath sounds.  ?Abdominal:  ?   Palpations: Abdomen is soft.  ?   Tenderness: There is no abdominal tenderness.  ?Neurological:  ?   Mental Status: Mental status is at baseline.  ? ? ? ?Data Reviewed: ?Relevant notes from primary care and specialist visits, past discharge summaries  as available in EHR, including Care Everywhere. ?Prior diagnostic testing as pertinent to current admission diagnoses ?Updated medications and problem lists for reconciliation ?ED course, including vitals, labs, imaging, treatment and response to treatment ?Triage notes, nursing and pharmacy notes and ED provider's notes ?Notable results as noted in HPI ? ? ?Assessment and Plan: ?* Sepsis secondary to acute pyelonephritis (HCC) ?Recent ESBL E. coli UTI on 3/29 sensitive to Zosyn and imipenem ?Continue Zosyn and follow-up cultures ?Sepsis fluids ? ?History of ESBL E. coli infection ?Management as above ? ?Fall at home, initial encounter ?CT head and C-spine without evidence of injury ? ? ?Neurogenic bladder, with intermittent self catheterization ?Nursing to cath 4 times daily until patient able.  Patient states he feels weak and would rather have nurses cath him versus having Foley placed ? ?CAD s/p coronary artery stent placement ?No complaints of chest pain.  Continue antiplatelets, statins, beta-blockers ? ?Diabetes mellitus without complication (HCC) ?Hold metformin.  Sliding scale insulin coverage ? ?Seizure disorder (HCC) ?Continue phenytoin and phenobarbital ? ?Essential hypertension ?Continue metoprolol and  amlodipine ? ? ? ?Advance Care Planning:   Code Status: Prior  ? ?Consults: none ? ?Family Communication: none ? ?Severity of Illness: ?The appropriate patient status for this patient is INPATIENT. Inpatient status is judged to be reaso

## 2021-09-18 NOTE — ED Provider Notes (Signed)
? ?[facility] ?Provider Note ? ? ? Event Date/Time  ? First MD Initiated Contact with Patient 09/18/21 2021   ?  (approximate) ? ? ?History  ? ?Fever ? ? ?HPI ? ?Edward Thompson is a 71 y.o. male with history of coronary disease status post stents, seizure disorder, hypertension, sleep apnea, chronic urinary retention secondary to neurogenic bladder who with self-catheterization, carotid artery stenosis status post carotid endarterectomy who comes in with concern for fevers.  Patient reports fever and weakness for the past 2 days.  Had a fall and hit his head with a small knot on his forehead.  Patient had a temperature of 101.1 at home.  Family reports that he has been more weak than normal.  He does self cath.  They report that he has not been acting his normal self he did have a little bit of slurred speech yesterday but no slurred speech currently that is intermittent but reports no slurred speech now. ? ? ? ?I reviewed the prior admission where patient was admitted from 3/29 until 4/4 for UTI, sepsis.  Patient did have E. coli UTI at that time and had to have antibiotics switched.  Previously sensitive to Bactrim, Zosyn, Macrobid, imipenem ? ? ?Physical Exam  ? ?Triage Vital Signs: ?ED Triage Vitals [09/18/21 1654]  ?Enc Vitals Group  ?   BP 121/60  ?   Pulse Rate 97  ?   Resp 18  ?   Temp 100 ?F (37.8 ?C)  ?   Temp Source Oral  ?   SpO2 95 %  ?   Weight 155 lb (70.3 kg)  ?   Height 5\' 4"  (1.626 m)  ?   Head Circumference   ?   Peak Flow   ?   Pain Score 0  ?   Pain Loc   ?   Pain Edu?   ?   Excl. in [place]?   ? ? ?Most recent vital signs: ?Vitals:  ? 09/18/21 1654 09/18/21 2026  ?BP: 121/60 135/63  ?Pulse: 97 92  ?Resp: 18 (!) 22  ?Temp: 100 ?F (37.8 ?C)   ?SpO2: 95% 98%  ? ? ? ?General: Awake, no distress.  ?CV:  Good peripheral perfusion.  ?Resp:  Normal effort.  ?Abd:  No distention.  Soft and nontender ?Other:  Patient has equal strength in arms and legs cranial nerves appear  intact. ?Hematoma to forehead and to back of head. + c spine tenderness.  ? ?ED Results / Procedures / Treatments  ? ?Labs ?(all labs ordered are listed, but only abnormal results are displayed) ?Labs Reviewed  ?COMPREHENSIVE METABOLIC PANEL - Abnormal; Notable for the following components:  ?    Result Value  ? Glucose, Bld 146 (*)   ? Calcium 8.7 (*)   ? All other components within normal limits  ?CBC WITH DIFFERENTIAL/PLATELET - Abnormal; Notable for the following components:  ? WBC 14.7 (*)   ? RBC 3.89 (*)   ? Hemoglobin 12.1 (*)   ? HCT 36.7 (*)   ? Neutro Abs 12.3 (*)   ? Monocytes Absolute 1.2 (*)   ? Abs Immature Granulocytes 0.10 (*)   ? All other components within normal limits  ?PROTIME-INR - Abnormal; Notable for the following components:  ? Prothrombin Time 18.3 (*)   ? INR 1.5 (*)   ? All other components within normal limits  ?CULTURE, BLOOD (ROUTINE X 2)  ?CULTURE, BLOOD (ROUTINE X 2)  ?LACTIC ACID, PLASMA  ?  LACTIC ACID, PLASMA  ?URINALYSIS, ROUTINE W REFLEX MICROSCOPIC  ? ? ? ?EKG ? ?My interpretation of EKG: ? ?Sinus rhythm 94 without any ST elevation, T wave versions in lead III and aVF and V3.  Slightly new from prior ? ?RADIOLOGY ?I have reviewed the xray personally and interpreted personally and patient has a lot of gas noted with some elevation of the left hemidiaphragm. ? ?PROCEDURES: ? ?Critical Care performed: No ? ?.1-3 Lead EKG Interpretation ?Performed by: Vanessa [place], MD ?Authorized by: Vanessa Haliimaile, MD  ? ?  Interpretation: normal   ?  ECG rate:  90 ?  ECG rate assessment: normal   ?  Rhythm: sinus rhythm   ?  Ectopy: none   ?  Conduction: normal   ? ? ?MEDICATIONS ORDERED IN ED: ?Medications  ?piperacillin-tazobactam (ZOSYN) IVPB 3.375 g (3.375 g Intravenous New Bag/Given 09/18/21 2203)  ?acetaminophen (TYLENOL) tablet 1,000 mg (has no administration in time range)  ?sodium chloride 0.9 % bolus 1,000 mL (1,000 mLs Intravenous New Bag/Given 09/18/21 2122)  ? ? ? ?IMPRESSION / MDM /  ASSESSMENT AND PLAN / ED COURSE  ?I reviewed the triage vital signs and the nursing notes. ? ?Patient comes in with some weakness some slurred speech although that has now since resolved.  CT head ordered from triage given falling and hitting his head but patient does have C-spine tenderness needs CT cervical as well.  Also given the x-ray does show a little bit of gaseous distention will get CT renal to further evaluate as well.  He denies any cough or shortness of breath and oxygen levels look fine so I doubt any evidence of pneumonia or PE.  Patient's labs from triage ? ?CMP is reassuring.  Lactate is normal.  CBC shows elevated white count of 14.7 with a left shift. ? ? ? ?Patient technically meets sepsis criteria with heart rate and white count but unclear source at this time so holding on antibiotics but will start some IV fluid. ? ?UA consistent for UTI therefore code sepsis alerted ? ? ?IMPRESSION: ?1. Findings concerning for right-sided pyelonephritis. Please ?correlate with urinalysis. ?2. Multiple punctate nonobstructing bilateral renal calculi. ?3. Moderate retained stool throughout the colon consistent with ?constipation. No obstruction or ileus. ?4.  Aortic Atherosclerosis (ICD10-I70.0). ?The patient is on the cardiac monitor to evaluate for evidence of arrhythmia and/or significant heart rate changes. ? ? ?Concerns for pyelonephritis meeting sepsis criteria patient will be admitted for IV antibiotics ?  ? ? ?FINAL CLINICAL IMPRESSION(S) / ED DIAGNOSES  ? ?Final diagnoses:  ?Sepsis, due to unspecified organism, unspecified whether acute organ dysfunction present [facility])  ?Pyelonephritis  ? ? ? ?Rx / DC Orders  ? ?ED Discharge Orders   ? ? None  ? ?  ? ? ? ?Note:  This document was prepared using Dragon voice recognition software and may include unintentional dictation errors. ?  ?Vanessa [place]en[place], MD ?09/18/21 2221 ? ?

## 2021-09-18 NOTE — Consult Note (Signed)
CODE SEPSIS - PHARMACY COMMUNICATION ? ?**Broad Spectrum Antibiotics should be administered within 1 hour of Sepsis diagnosis** ? ?Time Code Sepsis Called/Page Received: 2154 ? ?Antibiotics Ordered: pip/tazo ? ?Time of 1st antibiotic administration: 2203 ? ? ? ? ? ?Oswald Hillock ,PharmD ?Clinical Pharmacist  ?09/18/2021  10:18 PM ? ?

## 2021-09-18 NOTE — ED Notes (Signed)
Pt given a kit to self cath per MD Jari Pigg ?

## 2021-09-18 NOTE — Assessment & Plan Note (Signed)
Continue phenytoin and phenobarbital 

## 2021-09-18 NOTE — Assessment & Plan Note (Signed)
Management as above °

## 2021-09-18 NOTE — Assessment & Plan Note (Signed)
CT head and C-spine without evidence of injury ? ?

## 2021-09-18 NOTE — Assessment & Plan Note (Addendum)
Continue metoprolol. 

## 2021-09-18 NOTE — Progress Notes (Signed)
Pt being followed by [facility] for Sepsis protocol. 

## 2021-09-18 NOTE — Assessment & Plan Note (Addendum)
Recent ESBL E. coli UTI on 3/29 sensitive to Zosyn and imipenem ?--started on zosyn on admission, switched to meropenem given hx of ESBL and bacteremia, then de-escalated to Ancef after cx returned pan-sensitive E coli.  (Blood and urine).  Patient completed 5 days of IV antibiotics and will be discharged on 5 days of Levaquin.  Met criteria for sepsis on admission given high level source plus fever, heart rate greater than 90, tachypnea and white blood cell count greater than 12.  Sepsis resolved. ?

## 2021-09-18 NOTE — Assessment & Plan Note (Addendum)
--  self I/O cath at home.  Pt appeared to be more incontinent of urine than having retention.  Pt was ordered self cath QID during hospitalization, and was found to be soaked in urine sometime. ?--Per daughter, pt also does not clean himself before self cath, which may contribute to his frequent UTI's. ?Plan: ?--cont I/O QID to ensure complete emptying.   ?--If pt has urine output in between I/O cath, can apply external cath.  Avoid more frequent I/O cath than q6h to avoid more trauma and bacterial introduction to his urinary track. ?

## 2021-09-18 NOTE — ED Notes (Signed)
Pt to CT at this time.

## 2021-09-18 NOTE — ED Notes (Signed)
RN Sunday Spillers to transport pt to inpatient floor, receiving RN Erline Levine, has agreed to resume care once pt has arrived to inpatient unit. Daughter has gone home for the evening.  ?

## 2021-09-18 NOTE — ED Notes (Signed)
MD Funk3 notified of pt's temp, pt has not taken anything, at this time no orders for Tylenol or Motrin  ?

## 2021-09-19 DIAGNOSIS — A419 Sepsis, unspecified organism: Secondary | ICD-10-CM | POA: Diagnosis not present

## 2021-09-19 DIAGNOSIS — N390 Urinary tract infection, site not specified: Secondary | ICD-10-CM | POA: Diagnosis not present

## 2021-09-19 DIAGNOSIS — R7881 Bacteremia: Secondary | ICD-10-CM | POA: Insufficient documentation

## 2021-09-19 LAB — BLOOD CULTURE ID PANEL (REFLEXED) - BCID2

## 2021-09-19 LAB — BASIC METABOLIC PANEL
Anion gap: 12 (ref 5–15)
BUN: 19 mg/dL (ref 8–23)
CO2: 20 mmol/L — ABNORMAL LOW (ref 22–32)
Calcium: 8.4 mg/dL — ABNORMAL LOW (ref 8.9–10.3)
Chloride: 107 mmol/L (ref 98–111)
Creatinine, Ser: 0.79 mg/dL (ref 0.61–1.24)
GFR, Estimated: >60 mL/min (ref >60)
Glucose, Bld: 120 mg/dL — ABNORMAL HIGH (ref 70–99)
Potassium: 3.5 mmol/L (ref 3.5–5.1)
Sodium: 139 mmol/L (ref 135–145)

## 2021-09-19 LAB — CBC
HCT: 32.7 % — ABNORMAL LOW (ref 39.0–52.0)
Hemoglobin: 10.8 g/dL — ABNORMAL LOW (ref 13.0–17.0)
MCH: 31.4 pg (ref 26.0–34.0)
MCHC: 33 g/dL (ref 30.0–36.0)
MCV: 95.1 fL (ref 80.0–100.0)
Platelets: 130 10*3/uL — ABNORMAL LOW (ref 150–400)
RBC: 3.44 MIL/uL — ABNORMAL LOW (ref 4.22–5.81)
RDW: 13.2 % (ref 11.5–15.5)
WBC: 12.1 10*3/uL — ABNORMAL HIGH (ref 4.0–10.5)
nRBC: 0 % (ref 0.0–0.2)

## 2021-09-19 LAB — GLUCOSE, CAPILLARY: Glucose-Capillary: 105 mg/dL — ABNORMAL HIGH (ref 70–99)

## 2021-09-19 MED ORDER — ASPIRIN EC 81 MG PO TBEC
81.0000 mg | DELAYED_RELEASE_TABLET | Freq: Every day | ORAL | Status: DC
  Administered 2021-09-19 – 2021-09-22 (×4): 81 mg via ORAL
  Filled 2021-09-19 (×4): qty 1

## 2021-09-19 MED ORDER — SODIUM CHLORIDE 0.9 % IV SOLN
1.0000 g | Freq: Three times a day (TID) | INTRAVENOUS | Status: DC
  Administered 2021-09-19 – 2021-09-21 (×7): 1 g via INTRAVENOUS
  Filled 2021-09-19: qty 1
  Filled 2021-09-19: qty 20
  Filled 2021-09-19 (×6): qty 1

## 2021-09-19 NOTE — Progress Notes (Signed)
?  Progress Note ? ? ?Patient: Edward Thompson SKA:[id number] DOB: 1950/07/04 DOA: 09/18/2021     1 ?DOS: the patient was seen and examined on 09/19/2021 ?  ?Brief hospital course: ?No notes on file ? ?Assessment and Plan: ?* Sepsis secondary to acute pyelonephritis (HCC) ?Recent ESBL E. coli UTI on 3/29 sensitive to Zosyn and imipenem ?--started on zosyn on admission ?--blood cx also pos for E coli ?Plan: ?--switch to meropenem given hx of ESBL and bacteremia. ? ?History of ESBL E. coli infection ?Management as above ? ?Fall at home, initial encounter ?CT head and C-spine without evidence of injury ? ? ?Neurogenic bladder, with intermittent self catheterization ?--self cath QID ? ?CAD s/p coronary artery stent placement ?No complaints of chest pain.  ?--resume home ASA ? ?Bacteremia ?--3/4 bottles GNR, E coli ?--switch from zosyn to meropenem given hx of ESBL and bacteremia. ? ?Diabetes mellitus without complication (HCC) ?Hold metformin.   ?--d/c BG checks and SSI ? ?Seizure disorder (HCC) ?Continue phenytoin and phenobarbital ? ?Essential hypertension ?Continue metoprolol  ? ? ? ? ?  ? ?Subjective:  ?Pt denied pain.  Good oral intake.  Currently able to self cath. ? ? ?Physical Exam: ? ?Constitutional: NAD, AAOx3, sitting in recliner ?HEENT: conjunctivae and lids normal, EOMI ?CV: No cyanosis.   ?RESP: normal respiratory effort, on RA ?Extremities: right forearm in cast ?SKIN: warm, dry ?Neuro: II - XII grossly intact.   ? ? ?Data Reviewed: ? ?Family Communication:  ? ?Disposition: ?Status is: Inpatient ? ? Planned Discharge Destination: Home ? ? ? ?Time spent: 50 minutes ? ?Author: ?Darlin Priestly, MD ?09/19/2021 5:04 PM ? ?For on call review [email or web address].  ?

## 2021-09-19 NOTE — Assessment & Plan Note (Addendum)
--  3/4 bottles GNR, E coli ?--switched from zosyn to meropenem given hx of ESBL and bacteremia, then de-escalated to Ancef when cx resulted. ?--blood cx grew pan-sensitive E coli ?--had another fever on 5/14, repeat blood cx obtained ?Plan: ?--cont Ancef (day 5 of abx) ?--f/u repeat blood cx ?--consider discharge on Cipro ?

## 2021-09-19 NOTE — Progress Notes (Signed)
PHARMACY - PHYSICIAN COMMUNICATION ?CRITICAL VALUE ALERT - BLOOD CULTURE IDENTIFICATION (BCID) ? ?Edward Thompson is an 71 y.o. male who presented to [facility] on [facility]/18[facility] with a chief complaint of urosepsis secondary to pyelonephritis ? ?Assessment:  3/4 bottles GNR. BCID detected E coli, no resistance. Suspect urinary source. ? ?Name of physician (or Provider) Contacted: Dr. Fran Lowes ? ?Current antibiotics: Zosyn ? ?Changes to prescribed antibiotics recommended:  ?Will change to meropenem for now given history of ESBL organisms pending final susceptibility results. Possibility that organism harbors ESBL gene not detected by our BCID panel. ? ?Results for orders placed or performed during the hospital encounter of 09/18/21  ?Blood Culture ID Panel (Reflexed) (Collected: 09/18/2021  4:56 PM)  ?Result Value Ref Range  ? Enterococcus faecalis NOT DETECTED NOT DETECTED  ? Enterococcus Faecium NOT DETECTED NOT DETECTED  ? Listeria monocytogenes NOT DETECTED NOT DETECTED  ? Staphylococcus species NOT DETECTED NOT DETECTED  ? Staphylococcus aureus (BCID) NOT DETECTED NOT DETECTED  ? Staphylococcus epidermidis NOT DETECTED NOT DETECTED  ? Staphylococcus lugdunensis NOT DETECTED NOT DETECTED  ? Streptococcus species NOT DETECTED NOT DETECTED  ? Streptococcus agalactiae NOT DETECTED NOT DETECTED  ? Streptococcus pneumoniae NOT DETECTED NOT DETECTED  ? Streptococcus pyogenes NOT DETECTED NOT DETECTED  ? A.calcoaceticus-baumannii NOT DETECTED NOT DETECTED  ? Bacteroides fragilis NOT DETECTED NOT DETECTED  ? Enterobacterales DETECTED (A) NOT DETECTED  ? Enterobacter cloacae complex NOT DETECTED NOT DETECTED  ? Escherichia coli DETECTED (A) NOT DETECTED  ? Klebsiella aerogenes NOT DETECTED NOT DETECTED  ? Klebsiella oxytoca NOT DETECTED NOT DETECTED  ? Klebsiella pneumoniae NOT DETECTED NOT DETECTED  ? Proteus species NOT DETECTED NOT DETECTED  ? Salmonella species NOT DETECTED NOT DETECTED  ? Serratia marcescens NOT DETECTED NOT  DETECTED  ? Haemophilus influenzae NOT DETECTED NOT DETECTED  ? Neisseria meningitidis NOT DETECTED NOT DETECTED  ? Pseudomonas aeruginosa NOT DETECTED NOT DETECTED  ? Stenotrophomonas maltophilia NOT DETECTED NOT DETECTED  ? Candida albicans NOT DETECTED NOT DETECTED  ? Candida auris NOT DETECTED NOT DETECTED  ? Candida glabrata NOT DETECTED NOT DETECTED  ? Candida krusei NOT DETECTED NOT DETECTED  ? Candida parapsilosis NOT DETECTED NOT DETECTED  ? Candida tropicalis NOT DETECTED NOT DETECTED  ? Cryptococcus neoformans/gattii NOT DETECTED NOT DETECTED  ? CTX-M ESBL NOT DETECTED NOT DETECTED  ? Carbapenem resistance IMP NOT DETECTED NOT DETECTED  ? Carbapenem resistance KPC NOT DETECTED NOT DETECTED  ? Carbapenem resistance NDM NOT DETECTED NOT DETECTED  ? Carbapenem resist OXA 48 LIKE NOT DETECTED NOT DETECTED  ? Carbapenem resistance VIM NOT DETECTED NOT DETECTED  ? ? ?Tressie Ellis ?09/19/2021  8:12 AM ? ?

## 2021-09-20 DIAGNOSIS — N390 Urinary tract infection, site not specified: Secondary | ICD-10-CM | POA: Diagnosis not present

## 2021-09-20 DIAGNOSIS — A419 Sepsis, unspecified organism: Secondary | ICD-10-CM | POA: Diagnosis not present

## 2021-09-20 LAB — CBC
HCT: 33 % — ABNORMAL LOW (ref 39.0–52.0)
Hemoglobin: 11.2 g/dL — ABNORMAL LOW (ref 13.0–17.0)
MCH: 30.8 pg (ref 26.0–34.0)
MCHC: 33.9 g/dL (ref 30.0–36.0)
MCV: 90.7 fL (ref 80.0–100.0)
Platelets: 132 10*3/uL — ABNORMAL LOW (ref 150–400)
RBC: 3.64 MIL/uL — ABNORMAL LOW (ref 4.22–5.81)
RDW: 13.1 % (ref 11.5–15.5)
WBC: 11.2 10*3/uL — ABNORMAL HIGH (ref 4.0–10.5)
nRBC: 0 % (ref 0.0–0.2)

## 2021-09-20 LAB — BASIC METABOLIC PANEL
Anion gap: 7 (ref 5–15)
BUN: 18 mg/dL (ref 8–23)
CO2: 23 mmol/L (ref 22–32)
Calcium: 8.2 mg/dL — ABNORMAL LOW (ref 8.9–10.3)
Chloride: 109 mmol/L (ref 98–111)
Creatinine, Ser: 0.78 mg/dL (ref 0.61–1.24)
GFR, Estimated: >60 mL/min (ref >60)
Glucose, Bld: 149 mg/dL — ABNORMAL HIGH (ref 70–99)
Potassium: 2.9 mmol/L — ABNORMAL LOW (ref 3.5–5.1)
Sodium: 139 mmol/L (ref 135–145)

## 2021-09-20 LAB — MAGNESIUM: Magnesium: 1.9 mg/dL (ref 1.7–2.4)

## 2021-09-20 MED ORDER — POTASSIUM CHLORIDE CRYS ER 20 MEQ PO TBCR
40.0000 meq | EXTENDED_RELEASE_TABLET | ORAL | Status: AC
  Administered 2021-09-20 (×2): 40 meq via ORAL
  Filled 2021-09-20 (×2): qty 2

## 2021-09-20 NOTE — Evaluation (Signed)
Physical Therapy Evaluation ?Patient Details ?Name: Edward Thompson ?MRN: [id number] ?DOB: 1950/08/02 ?Today's Date: 09/20/2021 ? ?History of Present Illness ? Pt is a 71 y/o M admitted on 09/18/21 after presenting to the ED with c/o weakness resulting in a fall in which he hit her head without LOC. Pt is being treated for Sepsis secondary to acute pyelonephritis. PMH: CAD s/p stent, seizure disorder, HTN, neurogenic bladder who self catheterizes, DM, foot drop, heart attack, hepatitis, HLD, MI, syncope, vertigo  ?Clinical Impression ? Pt seen for PT evaluation with pt reporting he completes bed<>w/c transfers independently at baseline but endorses frequent falls. On this date, pt requires mod assist for stand pivot recliner<>bed. Pt also has RUE forearm cast donned (pt reports he will have it removed this coming week & then will be in a sling) so RUE maintained as much as possible throughout session. Pt does endorse feeling weaker compared to baseline. Will continue to follow pt acutely to address strength, balance, and independence with transfers. ? ?   ? ?Recommendations for follow up therapy are one component of a multi-disciplinary discharge planning process, led by the attending physician.  Recommendations may be updated based on patient status, additional functional criteria and insurance authorization. ? ?Follow Up Recommendations Home health PT ? ?  ?Assistance Recommended at Discharge Intermittent Supervision/Assistance  ?Patient can return home with the following ? A little help with walking and/or transfers;A little help with bathing/dressing/bathroom;Help with stairs or ramp for entrance;Assist for transportation ? ?  ?Equipment Recommendations None recommended by PT  ?Recommendations for Other Services ?    ?  ?Functional Status Assessment Patient has had a recent decline in their functional status and demonstrates the ability to make significant improvements in function in a reasonable and predictable  amount of time.  ? ?  ?Precautions / Restrictions Precautions ?Precautions: Fall ?Restrictions ?Weight Bearing Restrictions: Yes ?RUE Weight Bearing: Non weight bearing (pt in forearm cast, reports he's not supposed to put weight through it, supposed to have cast removed this coming week & then use sling)  ? ?  ? ?Mobility ? Bed Mobility ?  ?  ?  ?  ?  ?  ?  ?General bed mobility comments: not observed, pt received & left sitting in recliner ?  ? ?Transfers ?Overall transfer level: Needs assistance ?  ?Transfers: Bed to chair/wheelchair/BSC ?  ?Stand pivot transfers: Mod assist ?  ?  ?  ?  ?General transfer comment: stand pivot recliner<>bed without AD with mod assist ?  ? ?Ambulation/Gait ?  ?  ?  ?  ?  ?  ?  ?  ? ?Stairs ?  ?  ?  ?  ?  ? ?Wheelchair Mobility ?  ? ?Modified Rankin (Stroke Patients Only) ?  ? ?  ? ?Balance Overall balance assessment: Needs assistance ?Sitting-balance support: Feet supported, Bilateral upper extremity supported ?Sitting balance-Leahy Scale: Fair ?Sitting balance - Comments: supervision for static sitting EOB ?  ?Standing balance support: During functional activity, Bilateral upper extremity supported ?Standing balance-Leahy Scale: Poor ?  ?  ?  ?  ?  ?  ?  ?  ?  ?  ?  ?  ?   ? ? ? ?Pertinent Vitals/Pain Pain Assessment ?Pain Assessment: No/denies pain  ? ? ?Home Living Family/patient expects to be discharged to:: Private residence ?Living Arrangements: Other relatives ?Available Help at Discharge: Family;Available 24 hours/day (adult children & teenage grandchildren) ?Type of Home: House ?Home Access: Ramped entrance ?  ?  ?  ?  Home Layout: One level ?Home Equipment: Conservation officer, nature (2 wheels);Wheelchair - manual ?   ?  ?Prior Function Prior Level of Function : Needs assist;History of Falls (last six months) ?  ?  ?  ?  ?  ?  ?Mobility Comments: Pt reports he completes bed<>w/c transfers without assistance, uses w/c for mobility, does endorse frequent falls. ?ADLs Comments: Self caths  at baseline. ?  ? ? ?Hand Dominance  ?   ? ?  ?Extremity/Trunk Assessment  ? Upper Extremity Assessment ?Upper Extremity Assessment: RUE deficits/detail ?RUE Deficits / Details: RUE in forearm cast (pt reports he's supposed to have it removed this coming week & then will be in a sling) ?  ? ?Lower Extremity Assessment ?Lower Extremity Assessment: Generalized weakness ?  ? ?   ?Communication  ? Communication: No difficulties  ?Cognition Arousal/Alertness: Awake/alert ?Behavior During Therapy: [facility] for tasks assessed/performed ?Overall Cognitive Status: Within Functional Limits for tasks assessed ?  ?  ?  ?  ?  ?  ?  ?  ?  ?  ?  ?  ?  ?  ?  ?  ?  ?  ?  ? ?  ?General Comments   ? ?  ?Exercises    ? ?Assessment/Plan  ?  ?PT Assessment Patient needs continued PT services  ?PT Problem List Decreased strength;Decreased mobility;Decreased activity tolerance;Decreased balance ? ?   ?  ?PT Treatment Interventions DME instruction;Therapeutic activities;Modalities;Therapeutic exercise;Patient/family education;Balance training;Functional mobility training;Neuromuscular re-education;Manual techniques   ? ?PT Goals (Current goals can be found in the Care Plan section)  ?Acute Rehab PT Goals ?Patient Stated Goal: get better ?PT Goal Formulation: With patient ?Time For Goal Achievement: 10/04/21 ?Potential to Achieve Goals: Good ? ?  ?Frequency Min 2X/week ?  ? ? ?Co-evaluation   ?  ?  ?  ?  ? ? ?  ?AM-PAC PT "6 Clicks" Mobility  ?Outcome Measure Help needed turning from your back to your side while in a flat bed without using bedrails?: A Little ?Help needed moving from lying on your back to sitting on the side of a flat bed without using bedrails?: A Little ?Help needed moving to and from a bed to a chair (including a wheelchair)?: A Lot ?Help needed standing up from a chair using your arms (e.g., wheelchair or bedside chair)?: A Little ?Help needed to walk in hospital room?: Total ?Help needed climbing 3-5 steps with a railing? :  Total ?6 Click Score: 13 ? ?  ?End of Session   ?Activity Tolerance: Patient tolerated treatment well ?Patient left: in chair;with call bell/phone within reach ?Nurse Communication: Mobility status ?PT Visit Diagnosis: Other abnormalities of gait and mobility (R26.89);Muscle weakness (generalized) (M62.81) ?  ? ?Time: [id number] ?PT Time Calculation (min) (ACUTE ONLY): 13 min ? ? ?Charges:   PT Evaluation ?$PT Eval Low Complexity: 1 Low ?  ?  ?   ? ? ?Lavone Nian, PT, DPT ?09/20/21, 10:29 AM ? ? ?Waunita Schooner ?09/20/2021, 10:27 AM ? ?

## 2021-09-20 NOTE — Progress Notes (Signed)
?  Progress Note ? ? ?Patient: Edward Thompson [id number] DOB: 10-03-1950 DOA: 09/18/2021     2 ?DOS: the patient was seen and examined on 09/20/2021 ?  ?Brief hospital course: ?No notes on file ? ?Assessment and Plan: ?* Sepsis secondary to acute pyelonephritis (HCC) ?Recent ESBL E. coli UTI on 3/29 sensitive to Zosyn and imipenem ?--started on zosyn on admission, switched to meropenem given hx of ESBL and bacteremia. ?--blood cx also pos for E coli ?Plan: ?--cont Meropenem ? ?History of ESBL E. coli infection ?Management as above ? ?Fall at home, initial encounter ?CT head and C-spine without evidence of injury ? ? ?Neurogenic bladder, with intermittent self catheterization ?--self cath QID ? ?CAD s/p coronary artery stent placement ?No complaints of chest pain.  ?--cont home ASA ? ?Bacteremia ?--3/4 bottles GNR, E coli ?--switched from zosyn to meropenem given hx of ESBL and bacteremia. ?Plan: ?--repeat blood cx ?--cont meropenem ? ?Diabetes mellitus without complication (HCC) ?Hold metformin.   ?--d/c'ed BG checks and SSI ? ?Seizure disorder (HCC) ?Continue phenytoin and phenobarbital ? ?Essential hypertension ?Continue metoprolol  ? ? ? ? ?  ? ?Subjective:  ?No new complaints today. ? ? ?Physical Exam: ? ?Constitutional: NAD ?CV: No cyanosis.   ?RESP: normal respiratory effort, on RA ?SKIN: warm, dry ? ? ?Data Reviewed: ? ?Family Communication:  ? ?Disposition: ?Status is: Inpatient ? ? Planned Discharge Destination: Home ? ? ? ?Time spent: 35 minutes ? ?Author: ?Darlin Priestly, MD ?09/20/2021 6:31 PM ? ?For on call review [email or web address].  ?

## 2021-09-21 DIAGNOSIS — A419 Sepsis, unspecified organism: Secondary | ICD-10-CM | POA: Diagnosis not present

## 2021-09-21 DIAGNOSIS — N390 Urinary tract infection, site not specified: Secondary | ICD-10-CM | POA: Diagnosis not present

## 2021-09-21 LAB — CBC
HCT: 34.3 % — ABNORMAL LOW (ref 39.0–52.0)
Hemoglobin: 11.5 g/dL — ABNORMAL LOW (ref 13.0–17.0)
MCH: 31 pg (ref 26.0–34.0)
MCHC: 33.5 g/dL (ref 30.0–36.0)
MCV: 92.5 fL (ref 80.0–100.0)
Platelets: 159 10*3/uL (ref 150–400)
RBC: 3.71 MIL/uL — ABNORMAL LOW (ref 4.22–5.81)
RDW: 13.2 % (ref 11.5–15.5)
WBC: 8.2 10*3/uL (ref 4.0–10.5)
nRBC: 0 % (ref 0.0–0.2)

## 2021-09-21 LAB — CULTURE, BLOOD (ROUTINE X 2): Special Requests: ADEQUATE

## 2021-09-21 LAB — URINE CULTURE: Culture: >=100000 — AB

## 2021-09-21 LAB — BASIC METABOLIC PANEL
Anion gap: 8 (ref 5–15)
BUN: 24 mg/dL — ABNORMAL HIGH (ref 8–23)
CO2: 22 mmol/L (ref 22–32)
Calcium: 8.4 mg/dL — ABNORMAL LOW (ref 8.9–10.3)
Chloride: 108 mmol/L (ref 98–111)
Creatinine, Ser: 0.82 mg/dL (ref 0.61–1.24)
GFR, Estimated: >60 mL/min (ref >60)
Glucose, Bld: 146 mg/dL — ABNORMAL HIGH (ref 70–99)
Potassium: 3.9 mmol/L (ref 3.5–5.1)
Sodium: 138 mmol/L (ref 135–145)

## 2021-09-21 LAB — MAGNESIUM: Magnesium: 1.9 mg/dL (ref 1.7–2.4)

## 2021-09-21 MED ORDER — CEFAZOLIN SODIUM-DEXTROSE 2-4 GM/100ML-% IV SOLN
2.0000 g | Freq: Three times a day (TID) | INTRAVENOUS | Status: DC
  Administered 2021-09-21 – 2021-09-23 (×7): 2 g via INTRAVENOUS
  Filled 2021-09-21 (×7): qty 100

## 2021-09-21 NOTE — Progress Notes (Addendum)
Physical Therapy Treatment ?Patient Details ?Name: Edward Thompson ?MRN: [id number] ?DOB: Jun 03, 1950 ?Today's Date: 09/21/2021 ? ? ?History of Present Illness Pt is a 71 y/o M admitted on 09/18/21 after presenting to the ED with c/o weakness resulting in a fall in which he hit her head without LOC. Pt is being treated for Sepsis secondary to acute pyelonephritis. PMH: CAD s/p stent, seizure disorder, HTN, neurogenic bladder who self catheterizes, DM, foot drop, heart attack, hepatitis, HLD, MI, syncope, vertigo ? ?  ?PT Comments  ? ? Pt seen for PT tx with pt received asleep in bed but easily awakened & agreeable to tx. Pt requires cuing for use of bed rails to complete supine>sit. Pt requires mod assist for stand pivot to recliner and mod<>max assist for 5x STS from recliner but pt unable to come to full upright standing without BUE support. Pt also requires dependent assist for peri hygiene as pt incontinent of BM. Pt continues to use RUE to push to standing despite PT reinforcing he's NWB RUE per pt report. Will continue to follow pt acutely to address endurance, strengthening, balance, and independence with transfers. ? ?   ?Recommendations for follow up therapy are one component of a multi-disciplinary discharge planning process, led by the attending physician.  Recommendations may be updated based on patient status, additional functional criteria and insurance authorization. ? ?Follow Up Recommendations ? Home health PT ?  ?  ?Assistance Recommended at Discharge Intermittent Supervision/Assistance  ?Patient can return home with the following Help with stairs or ramp for entrance;Assist for transportation;A lot of help with walking and/or transfers;A lot of help with bathing/dressing/bathroom;Assistance with cooking/housework ?  ?Equipment Recommendations ? BSC/3in1  ?  ?Recommendations for Other Services  OT consult ? ? ?  ?Precautions / Restrictions Precautions ?Precautions: Fall ?Restrictions ?Weight Bearing  Restrictions: Yes ?RUE Weight Bearing: Non weight bearing  ?  ? ?Mobility ? Bed Mobility ?Overal bed mobility: Needs Assistance ?Bed Mobility: Supine to Sit ?  ?  ?Supine to sit: Supervision, HOB elevated ?  ?  ?General bed mobility comments: extra time to push trunk upright to sitting EOB with use of bed rails & HOB elevated ?  ? ?Transfers ?Overall transfer level: Needs assistance ?Equipment used: None ?Transfers: Sit to/from Stand, Bed to chair/wheelchair/BSC ?Sit to Stand: Mod assist ?Stand pivot transfers: Mod assist ?  ?  ?  ?  ?  ?  ? ?Ambulation/Gait ?  ?  ?  ?  ?  ?  ?  ?  ? ? ?Stairs ?  ?  ?  ?  ?  ? ? ?Wheelchair Mobility ?  ? ?Modified Rankin (Stroke Patients Only) ?  ? ? ?  ?Balance Overall balance assessment: Needs assistance ?Sitting-balance support: Feet supported, Bilateral upper extremity supported ?Sitting balance-Leahy Scale: Fair ?Sitting balance - Comments: supervision for static sitting EOB ?  ?Standing balance support: During functional activity, No upper extremity supported ?Standing balance-Leahy Scale: Poor ?  ?  ?  ?  ?  ?  ?  ?  ?  ?  ?  ?  ?  ? ?  ?Cognition Arousal/Alertness: Awake/alert ?Behavior During Therapy: [facility] for tasks assessed/performed ?Overall Cognitive Status: Within Functional Limits for tasks assessed ?  ?  ?  ?  ?  ?  ?  ?  ?  ?  ?  ?  ?  ?  ?  ?  ?  ?  ?  ? ?  ?Exercises General Exercises - Lower  Extremity ?Long Arc Quad: AROM, Strengthening, Both, 10 reps, Seated ?Hip Flexion/Marching: AROM, Strengthening,10 reps, Seated, x 5 reps standing with LUE on RW for support & PT providing cuing for increasing BOS to improve balance with standing as pt attempts to place feet together, pt with difficulty weight bearing & balancing when lifting RLE to march ? ?  ?General Comments   ?  ?  ? ?Pertinent Vitals/Pain Pain Assessment ?Pain Assessment: No/denies pain  ? ? ?Home Living   ?  ?  ?  ?  ?  ?  ?  ?  ?  ?   ?  ?Prior Function    ?  ?  ?   ? ?PT Goals (current goals can now be  found in the care plan section) Acute Rehab PT Goals ?Patient Stated Goal: get better ?PT Goal Formulation: With patient ?Time For Goal Achievement: 10/04/21 ?Potential to Achieve Goals: Good ?Progress towards PT goals: Progressing toward goals ? ?  ?Frequency ? ? ? Min 2X/week ? ? ? ?  ?PT Plan Equipment recommendations need to be updated  ? ? ?Co-evaluation   ?  ?  ?  ?  ? ?  ?AM-PAC PT "6 Clicks" Mobility   ?Outcome Measure ? Help needed turning from your back to your side while in a flat bed without using bedrails?: A Little ?Help needed moving from lying on your back to sitting on the side of a flat bed without using bedrails?: A Little ?Help needed moving to and from a bed to a chair (including a wheelchair)?: A Lot ?Help needed standing up from a chair using your arms (e.g., wheelchair or bedside chair)?: A Little ?Help needed to walk in hospital room?: Total ?Help needed climbing 3-5 steps with a railing? : Total ?6 Click Score: 13 ? ?  ?End of Session   ?Activity Tolerance: Patient tolerated treatment well ?Patient left: in chair;with nursing/sitter in room;with call bell/phone within reach ?Nurse Communication: Mobility status ?PT Visit Diagnosis: Other abnormalities of gait and mobility (R26.89);Muscle weakness (generalized) (M62.81) ?  ? ? ?Time: 8341-9622 ?PT Time Calculation (min) (ACUTE ONLY): 23 min ? ?Charges:  $Therapeutic Activity: 23-37 mins          ?          ? ?Aleda Grana, PT, DPT ?09/21/21, 3:51 PM ? ? ? ?Sandi Mariscal ?09/21/2021, 3:44 PM ? ?

## 2021-09-21 NOTE — Progress Notes (Signed)
?  Progress Note ? ? ?Patient: Edward Thompson [id number] DOB: Jun 26, 1950 DOA: 09/18/2021     3 ?DOS: the patient was seen and examined on 09/21/2021 ?  ?Brief hospital course: ?No notes on file ? ?Assessment and Plan: ?* Sepsis secondary to acute pyelonephritis (HCC) ?Recent ESBL E. coli UTI on 3/29 sensitive to Zosyn and imipenem ?--started on zosyn on admission, switched to meropenem given hx of ESBL and bacteremia. ?--blood cx also pos for E coli ?--urine cx pos for pan-sensitive E coli ?Plan: ?--de-escalate to Ancef ? ?History of ESBL E. coli infection ?Management as above ? ?Fall at home, initial encounter ?CT head and C-spine without evidence of injury ? ? ?Neurogenic bladder, with intermittent self catheterization ?--self cath QID ? ?CAD s/p coronary artery stent placement ?No complaints of chest pain.  ?--cont home ASA ? ?Bacteremia ?--3/4 bottles GNR, E coli ?--switched from zosyn to meropenem given hx of ESBL and bacteremia. ?--had another fever on 5/14, repeat blood cx obtained ?--blood cx grew pan-sensitive E coli ?Plan: ?--de-escalate to Ancef ?--f/u repeat blood cx ? ?Diabetes mellitus without complication (HCC) ?Hold metformin.   ?--d/c'ed BG checks and SSI ? ?Seizure disorder (HCC) ?Continue phenytoin and phenobarbital ? ?Essential hypertension ?Continue metoprolol  ? ? ? ? ?  ? ?Subjective:  ?Had a fever last night.   ? ?Sleeping a lot during the day but ate full meals. ? ? ?Physical Exam: ? ?Constitutional: NAD, sleeping but arousable ?CV: No cyanosis.   ?RESP: normal respiratory effort, on RA ?SKIN: warm, dry ? ? ?Data Reviewed: ? ?Family Communication: daughter updated on the phone today ? ?Disposition: ?Status is: Inpatient ? ? Planned Discharge Destination: Home ? ? ? ?Time spent: 35 minutes ? ?Author: ?Darlin Priestly, MD ?09/21/2021 5:41 PM ? ?For on call review [email or web address]  ?

## 2021-09-22 DIAGNOSIS — A419 Sepsis, unspecified organism: Secondary | ICD-10-CM | POA: Diagnosis not present

## 2021-09-22 DIAGNOSIS — N390 Urinary tract infection, site not specified: Secondary | ICD-10-CM | POA: Diagnosis not present

## 2021-09-22 LAB — CBC
HCT: 33 % — ABNORMAL LOW (ref 39.0–52.0)
Hemoglobin: 11.1 g/dL — ABNORMAL LOW (ref 13.0–17.0)
MCH: 31.2 pg (ref 26.0–34.0)
MCHC: 33.6 g/dL (ref 30.0–36.0)
MCV: 92.7 fL (ref 80.0–100.0)
Platelets: 176 10*3/uL (ref 150–400)
RBC: 3.56 MIL/uL — ABNORMAL LOW (ref 4.22–5.81)
RDW: 13.3 % (ref 11.5–15.5)
WBC: 7.1 10*3/uL (ref 4.0–10.5)
nRBC: 0 % (ref 0.0–0.2)

## 2021-09-22 LAB — BASIC METABOLIC PANEL
Anion gap: 7 (ref 5–15)
BUN: 23 mg/dL (ref 8–23)
CO2: 24 mmol/L (ref 22–32)
Calcium: 8.2 mg/dL — ABNORMAL LOW (ref 8.9–10.3)
Chloride: 106 mmol/L (ref 98–111)
Creatinine, Ser: 0.69 mg/dL (ref 0.61–1.24)
GFR, Estimated: >60 mL/min (ref >60)
Glucose, Bld: 142 mg/dL — ABNORMAL HIGH (ref 70–99)
Potassium: 3.9 mmol/L (ref 3.5–5.1)
Sodium: 137 mmol/L (ref 135–145)

## 2021-09-22 LAB — MAGNESIUM: Magnesium: 2 mg/dL (ref 1.7–2.4)

## 2021-09-22 MED ORDER — BACITRACIN ZINC 500 UNIT/GM EX OINT
1.0000 "application " | TOPICAL_OINTMENT | Freq: Two times a day (BID) | CUTANEOUS | Status: DC
  Administered 2021-09-22 – 2021-09-23 (×3): 1 via TOPICAL
  Filled 2021-09-22 (×2): qty 0.9

## 2021-09-22 NOTE — Hospital Course (Signed)
Edward Thompson is a 71 y.o. male with medical history significant for CAD s/p stent, seizure disorder, HTN, neurogenic bladder who self catheterizes, recently hospitalized from 3/29 to 4/4 with ESBL E. coli UTI, who presents to the ED with weakness resulting in a fall in which hit his head without loss of consciousness and without other injury.  Patient had a similar presentation with weakness and a fall at his last hospitalization. ? ?Workup found pt had right-sided pyelonephritis and bacteremia.  Started on IV abx.  Cx resulted pan-sensitive E coli. ?

## 2021-09-22 NOTE — Evaluation (Signed)
Occupational Therapy Evaluation ?Patient Details ?Name: Edward Thompson ?MRN: [id number] ?DOB: 01/06/1951 ?Today's Date: 09/22/2021 ? ? ?History of Present Illness Pt is a 71 y/o M admitted on 09/18/21 after presenting to the ED with c/o weakness resulting in a fall in which he hit her head without LOC. Pt is being treated for Sepsis secondary to acute pyelonephritis. PMH: CAD s/p stent, seizure disorder, HTN, neurogenic bladder who self catheterizes, DM, foot drop, heart attack, hepatitis, HLD, MI, syncope, vertigo  ? ?Clinical Impression ?  ?Pt was seen for OT evaluation this date. Prior to hospital admission, pt reports he completes bed<>w/c transfers independently at baseline but endorses frequent falls. He reports being able to complete toileting (self caths) and shower transfers without assist. Pt lives with adult children and grandchildren in a ramped home. Currently pt demonstrates impairments as described below (See OT problem list) which functionally limit his ability to perform ADL/self-care tasks. Pt currently requires MOD A for ADL transfers, MOD A for LB ADL, and MIN A for UB ADL. Pt found to be lying in a bed saturated with urine. Pt denies having told nursing. Pt educated in need to notify nursing and the risks of skin breakdown and infection. Pt reports he typically self caths every 3-4 hours but has been unable to maintain his normal routine here.  Pt would benefit from skilled OT services to address noted impairments and functional limitations (see below for any additional details) in order to maximize safety and independence while minimizing falls risk and caregiver burden. Upon hospital discharge, recommend HHOT to maximize pt safety and return to functional independence during meaningful occupations of daily life.  ?   ? ?Recommendations for follow up therapy are one component of a multi-disciplinary discharge planning process, led by the attending physician.  Recommendations may be updated based  on patient status, additional functional criteria and insurance authorization.  ? ?Follow Up Recommendations ? Home health OT  ?  ?Assistance Recommended at Discharge Frequent or constant Supervision/Assistance  ?Patient can return home with the following A lot of help with walking and/or transfers;A lot of help with bathing/dressing/bathroom;Assistance with cooking/housework;Assist for transportation;Direct supervision/assist for medications management ? ?  ?Functional Status Assessment ? Patient has had a recent decline in their functional status and demonstrates the ability to make significant improvements in function in a reasonable and predictable amount of time.  ?Equipment Recommendations ? BSC/3in1  ?  ?Recommendations for Other Services   ? ? ?  ?Precautions / Restrictions Precautions ?Precautions: Fall ?Restrictions ?Weight Bearing Restrictions: Yes ?RUE Weight Bearing: Non weight bearing ?Other Position/Activity Restrictions: RUE in forearm/wrist cast  ? ?  ? ?Mobility Bed Mobility ?Overal bed mobility: Needs Assistance ?Bed Mobility: Supine to Sit ?  ?  ?Supine to sit: Min guard, Supervision ?  ?  ?General bed mobility comments: increased time, effort, VC for bed rail use to improve ?  ? ?Transfers ?Overall transfer level: Needs assistance ?Equipment used: Rolling walker (2 wheels) ?Transfers: Sit to/from Stand, Bed to chair/wheelchair/BSC ?Sit to Stand: Mod assist, From elevated surface, Min assist ?Stand pivot transfers: Mod assist ?  ?  ?  ?  ?General transfer comment: VC for feet placement to improve balance, attempts to place L foot too far out in front of him leading to a posterior LOB ?  ? ?  ?Balance Overall balance assessment: Needs assistance ?Sitting-balance support: Feet supported, Bilateral upper extremity supported ?Sitting balance-Leahy Scale: Fair ?  ?  ?Standing balance support: Single extremity supported,  Reliant on assistive device for balance ?Standing balance-Leahy Scale: Poor ?  ?   ?  ?  ?  ?  ?  ?  ?  ?  ?  ?  ?   ? ?ADL either performed or assessed with clinical judgement  ? ?ADL Overall ADL's : Needs assistance/impaired ?  ?  ?  ?  ?  ?  ?  ?  ?  ?  ?  ?  ?  ?  ?  ?  ?  ?  ?  ?General ADL Comments: Pt attempted to use the urinal in a seated position. Requires MOD A for ADL transfers. MOD A for LB ADL tasks. Sitting and MIN A for seated UB ADL.  ? ? ? ?Vision   ?   ?   ?Perception   ?  ?Praxis   ?  ? ?Pertinent Vitals/Pain Pain Assessment ?Pain Assessment: No/denies pain  ? ? ? ?Hand Dominance   ?  ?Extremity/Trunk Assessment Upper Extremity Assessment ?Upper Extremity Assessment: RUE deficits/detail ?RUE Deficits / Details: RUE in forearm cast (pt reports he's supposed to have it removed this coming week & then will be in a sling) ?  ?Lower Extremity Assessment ?Lower Extremity Assessment: Generalized weakness ?  ?Cervical / Trunk Assessment ?Cervical / Trunk Assessment: Kyphotic ?  ?Communication Communication ?Communication: No difficulties ?  ?Cognition Arousal/Alertness: Awake/alert ?Behavior During Therapy: Flat affect ?Overall Cognitive Status: No family/caregiver present to determine baseline cognitive functioning ?  ?  ?  ?  ?  ?  ?  ?  ?  ?  ?  ?  ?  ?  ?  ?  ?General Comments: slow processing at times, appears fatigued, cues to improve alertness, decr safety awareness ?  ?  ?General Comments    ? ?  ?Exercises Other Exercises ?Other Exercises: ADL transfer training, RW mgt, urinal use, risk of skin breakdown and need for pt to let nursing know if/when he is wet ?  ?Shoulder Instructions    ? ? ?Home Living Family/patient expects to be discharged to:: Private residence ?Living Arrangements: Other relatives ?Available Help at Discharge: Family;Available 24 hours/day (adult children & teenage grandchildren) ?Type of Home: House ?Home Access: Ramped entrance ?  ?  ?Home Layout: One level ?  ?  ?Bathroom Shower/Tub: Tub/shower unit ?  ?Bathroom Toilet: Standard ?  ?  ?Home Equipment:  Agricultural consultant (2 wheels);Wheelchair - manual ?  ?  ?  ? ?  ?Prior Functioning/Environment Prior Level of Function : Needs assist;History of Falls (last six months) ?  ?  ?  ?  ?  ?  ?Mobility Comments: Pt reports he completes bed<>w/c transfers without assistance, uses w/c for mobility, does endorse frequent falls. ?ADLs Comments: Self caths at baseline. Reports typically able to complete shower transfers to shower chair and dressing himself. ?  ? ?  ?  ?OT Problem List: Decreased strength;Decreased safety awareness;Impaired balance (sitting and/or standing);Decreased knowledge of use of DME or AE;Impaired UE functional use ?  ?   ?OT Treatment/Interventions: Self-care/ADL training;Therapeutic exercise;Therapeutic activities;DME and/or AE instruction;Patient/family education;Balance training  ?  ?OT Goals(Current goals can be found in the care plan section) Acute Rehab OT Goals ?Patient Stated Goal: get better ?OT Goal Formulation: With patient ?Time For Goal Achievement: 10/06/21 ?Potential to Achieve Goals: Good ?ADL Goals ?Pt Will Perform Lower Body Dressing: with modified independence;sitting/lateral leans ?Pt Will Transfer to Toilet: bedside commode;with min guard assist;stand pivot transfer ?Pt Will Perform Toileting -  Clothing Manipulation and hygiene: with modified independence;sitting/lateral leans  ?OT Frequency: Min 2X/week ?  ? ?Co-evaluation   ?  ?  ?  ?  ? ?  ?AM-PAC OT "6 Clicks" Daily Activity     ?Outcome Measure Help from another person eating meals?: None ?Help from another person taking care of personal grooming?: A Little ?Help from another person toileting, which includes using toliet, bedpan, or urinal?: A Lot ?Help from another person bathing (including washing, rinsing, drying)?: A Lot ?Help from another person to put on and taking off regular upper body clothing?: A Little ?Help from another person to put on and taking off regular lower body clothing?: A Lot ?6 Click Score: 16 ?  ?End of  Session Nurse Communication: Mobility status;Other (comment) (urine soaked bed) ? ?Activity Tolerance: Patient tolerated treatment well ?Patient left: in chair;with call bell/phone within reach ? ?OT Vis

## 2021-09-22 NOTE — Consult Note (Signed)
[facility] CM Inpatient Consult ? ? ?09/22/2021 ? ?RHET RORKE ?[birth year] ?[id number] ? ?[facility] Customer service manager Care Management [facility] CM) ?  ?Patient chart has been reviewed with noted high risk score for unplanned readmissions.  Patient assessed for community [facility] Care Management follow up needs.  ? ?Plan: Will continue to follow for progression and disposition plans. ?  ?Of note, [facility] Care Management services does not replace or interfere with any services that are arranged by inpatient case management or social work.  ?  ?Christophe Louis, MSN, RN ?[facility] [employer] Liaison ?Toll free office [phone]   ? ?

## 2021-09-22 NOTE — Progress Notes (Addendum)
?  Progress Note ? ? ?Patient: Edward Thompson PIR:[id number] DOB: 1950-06-19 DOA: 09/18/2021     4 ?DOS: the patient was seen and examined on 09/22/2021 ?  ?Brief hospital course: ?Edward Thompson is a 71 y.o. male with medical history significant for CAD s/p stent, seizure disorder, HTN, neurogenic bladder who self catheterizes, recently hospitalized from 3/29 to 4/4 with ESBL E. coli UTI, who presents to the ED with weakness resulting in a fall in which hit his head without loss of consciousness and without other injury.  Patient had a similar presentation with weakness and a fall at his last hospitalization. ? ?Workup found pt had right-sided pyelonephritis and bacteremia.  Started on IV abx.  Cx resulted pan-sensitive E coli. ? ?Assessment and Plan: ?* Sepsis secondary to acute pyelonephritis (HCC) ?Recent ESBL E. coli UTI on 3/29 sensitive to Zosyn and imipenem ?--started on zosyn on admission, switched to meropenem given hx of ESBL and bacteremia, then de-escalated to Ancef after cx returned pan-sensitive E coli ?--blood cx also pos for E coli ?--urine cx pos for pan-sensitive E coli ?Plan: ?--cont Ancef (day 5 of abx today) ?--can discharge on cipro  ? ?Bacteremia ?--3/4 bottles GNR, E coli ?--switched from zosyn to meropenem given hx of ESBL and bacteremia, then de-escalated to Ancef when cx resulted. ?--blood cx grew pan-sensitive E coli ?--had another fever on 5/14, repeat blood cx obtained ?Plan: ?--cont Ancef (day 5 of abx) ?--f/u repeat blood cx ?--consider discharge on Cipro ? ?Fall at home, initial encounter ?CT head and C-spine without evidence of injury ? ? ?Neurogenic bladder, with intermittent self catheterization ?--self I/O cath at home.  Pt appeared to be more incontinent of urine than having retention.  Pt was ordered self cath QID during hospitalization, and was found to be soaked in urine sometime. ?--Per daughter, pt also does not clean himself before self cath, which may contribute to his  frequent UTI's. ?Plan: ?--cont I/O QID to ensure complete emptying.   ?--If pt has urine output in between I/O cath, can apply external cath.  Avoid more frequent I/O cath than q6h to avoid more trauma and bacterial introduction to his urinary track. ? ?CAD s/p coronary artery stent placement ?No complaints of chest pain.  ?--cont home ASA ? ?Diabetes mellitus without complication (HCC) ?Hold metformin.   ?--d/c'ed BG checks and SSI ? ?Seizure disorder (HCC) ?Continue phenytoin and phenobarbital ? ?Essential hypertension ?Continue metoprolol  ? ? ? ? ?  ? ?Subjective:  ?No more fever since yesterday. ? ? ?Physical Exam: ? ?Constitutional: NAD, AAOx3, sitting in recliner, slow processing ?HEENT: conjunctivae and lids normal, EOMI ?CV: No cyanosis.   ?RESP: normal respiratory effort, on RA ?SKIN: warm, dry.  1 skin boil over his upper back, with no fluctuance  ? ? ?Data Reviewed: ? ?Family Communication:  ? ?Disposition: ?Status is: Inpatient ? ? Planned Discharge Destination: Home on Wed if no more fever and repeat blood cx remain neg. ? ? ? ?Time spent: 35 minutes ? ?Author: ?Darlin Priestly, MD ?09/22/2021 8:08 PM ? ?For on call review [email or web address].  ?

## 2021-09-23 DIAGNOSIS — I10 Essential (primary) hypertension: Secondary | ICD-10-CM | POA: Diagnosis not present

## 2021-09-23 DIAGNOSIS — E663 Overweight: Secondary | ICD-10-CM | POA: Diagnosis not present

## 2021-09-23 DIAGNOSIS — N319 Neuromuscular dysfunction of bladder, unspecified: Secondary | ICD-10-CM | POA: Diagnosis not present

## 2021-09-23 DIAGNOSIS — A419 Sepsis, unspecified organism: Secondary | ICD-10-CM | POA: Diagnosis not present

## 2021-09-23 DIAGNOSIS — N12 Tubulo-interstitial nephritis, not specified as acute or chronic: Secondary | ICD-10-CM

## 2021-09-23 LAB — CBC
HCT: 33.1 % — ABNORMAL LOW (ref 39.0–52.0)
Hemoglobin: 10.8 g/dL — ABNORMAL LOW (ref 13.0–17.0)
MCH: 30.5 pg (ref 26.0–34.0)
MCHC: 32.6 g/dL (ref 30.0–36.0)
MCV: 93.5 fL (ref 80.0–100.0)
Platelets: 189 10*3/uL (ref 150–400)
RBC: 3.54 MIL/uL — ABNORMAL LOW (ref 4.22–5.81)
RDW: 13.4 % (ref 11.5–15.5)
WBC: 7 10*3/uL (ref 4.0–10.5)
nRBC: 0 % (ref 0.0–0.2)

## 2021-09-23 LAB — BASIC METABOLIC PANEL
Anion gap: 7 (ref 5–15)
BUN: 23 mg/dL (ref 8–23)
CO2: 26 mmol/L (ref 22–32)
Calcium: 8.4 mg/dL — ABNORMAL LOW (ref 8.9–10.3)
Chloride: 103 mmol/L (ref 98–111)
Creatinine, Ser: 0.64 mg/dL (ref 0.61–1.24)
GFR, Estimated: >60 mL/min (ref >60)
Glucose, Bld: 133 mg/dL — ABNORMAL HIGH (ref 70–99)
Potassium: 3.9 mmol/L (ref 3.5–5.1)
Sodium: 136 mmol/L (ref 135–145)

## 2021-09-23 LAB — MAGNESIUM: Magnesium: 2.1 mg/dL (ref 1.7–2.4)

## 2021-09-23 MED ORDER — LEVOFLOXACIN 500 MG PO TABS: 500.0000 mg | ORAL_TABLET | Freq: Every day | ORAL | 0 refills | Status: AC

## 2021-09-23 NOTE — Care Management Important Message (Signed)
Important Message ? ?Patient Details  ?Name: Edward Thompson ?MRN: [id number] ?Date of Birth: [birth year] ? ? ?Medicare Important Message Given:  Yes ? ? ? ? ?Edward Thompson ?09/23/2021, 2:54 PM ?

## 2021-09-23 NOTE — Discharge Summary (Signed)
?Physician Discharge Summary ?  ?Patient: Edward Thompson MRN: [id number] DOB: 1951-04-16  ?Admit date:     09/18/2021  ?Discharge date: 09/23/21  ?Discharge Physician: Annita Brod  ? ?PCP: Jodi Marble, MD  ? ?Recommendations at discharge:  ? ?New medication: Levaquin 500 p.o. daily x5 days ?Patient being discharged with home health PT, OT, RN and aide ? ?Discharge Diagnoses: ?Active Problems: ?  Neurogenic bladder, with intermittent self catheterization ?  Fall at home, initial encounter ?  Essential hypertension ?  Seizure disorder ([place]) ?  Diabetes mellitus without complication ([place]) ?  CAD s/p coronary artery stent placement ?  Overweight (BMI 25.0-29.9) ? ?Principal Problem (Resolved): ?  Sepsis secondary to acute pyelonephritis ([place]) ? ?Hospital Course: ?Edward Thompson is a 71 y.o. male with medical history significant for CAD s/p stent, seizure disorder, HTN, neurogenic bladder who self catheterizes, recently hospitalized from 3/29 to 4/4 with ESBL E. coli UTI, who presents to the ED with weakness resulting in a fall in which hit his head without loss of consciousness and without other injury.  Patient had a similar presentation with weakness and a fall at his last hospitalization. ? ?Workup found pt had right-sided pyelonephritis and bacteremia.  Started on IV abx.  Cx resulted pan-sensitive E coli. ? ?Assessment and Plan: ?* Sepsis secondary to acute pyelonephritis (HCC)-resolved as of 09/23/2021 ?Recent ESBL E. coli UTI on 3/29 sensitive to Zosyn and imipenem ?--started on zosyn on admission, switched to meropenem given hx of ESBL and bacteremia, then de-escalated to Ancef after cx returned pan-sensitive E coli.  (Blood and urine).  Patient completed 5 days of IV antibiotics and will be discharged on 5 days of Levaquin.  Met criteria for sepsis on admission given high level source plus fever, heart rate greater than 90, tachypnea and white blood cell count greater than 12.  Sepsis  resolved. ? ?Bacteremia ?--3/4 bottles GNR, E coli ?--switched from zosyn to meropenem given hx of ESBL and bacteremia, then de-escalated to Ancef when cx resulted. ?--blood cx grew pan-sensitive E coli ?--had another fever on 5/14, repeat blood cx obtained ?Plan: ?--cont Ancef (day 5 of abx) ?--f/u repeat blood cx ?--consider discharge on Cipro ? ?Neurogenic bladder, with intermittent self catheterization ?--self I/O cath at home.  Pt appeared to be more incontinent of urine than having retention.  Pt was ordered self cath QID during hospitalization, and was found to be soaked in urine sometime. ?--Per daughter, pt also does not clean himself before self cath, which may contribute to his frequent UTI's. ?Plan: ?--cont I/O QID to ensure complete emptying.   ?--If pt has urine output in between I/O cath, can apply external cath.  Avoid more frequent I/O cath than q6h to avoid more trauma and bacterial introduction to his urinary track. ? ?Fall at home, initial encounter ?CT head and C-spine without evidence of injury ? ? ?Seizure disorder ([place]) ?Continue phenytoin and phenobarbital ? ?Essential hypertension ?Continue metoprolol  ? ?Diabetes mellitus without complication ([place]) ?Hold metformin.   ?--d/c'ed BG checks and SSI ? ?CAD s/p coronary artery stent placement ?No complaints of chest pain.  ?--cont home ASA ? ?Overweight (BMI 25.0-29.9) ?Meets criteria BMI greater than 25 ? ? ? ? ?  ? ? ?Consultants: None ?Procedures performed: None ?Disposition: Home health ?Diet recommendation:  ?Discharge Diet Orders (From admission, onward)  ? ?  Start     Ordered  ? 09/23/21 0000  Diet - low sodium heart healthy       ?  09/23/21 1258  ? ?  ?  ? ?  ? ?Cardiac diet ?DISCHARGE MEDICATION: ?Allergies as of 09/23/2021   ?No Known Allergies ?  ? ?  ?Medication List  ?  ? ?TAKE these medications   ? ?Accu-Chek Guide test strip ?Generic drug: glucose blood ?  ?Accu-Chek Guide w/Device Kit ?  ?Accu-Chek Softclix Lancets lancets ?   ?acetaminophen 500 MG tablet ?Commonly known as: TYLENOL ?Take 500 mg by mouth every 6 (six) hours as needed. ?  ?amLODipine 5 MG tablet ?Commonly known as: NORVASC ?Take 0.5 tablets (2.5 mg total) by mouth daily. ?  ?aspirin EC 81 MG tablet ?Take 81 mg by mouth at bedtime. ?  ?escitalopram 10 MG tablet ?Commonly known as: LEXAPRO ?Take 10 mg by mouth daily. ?  ?feeding supplement (GLUCERNA SHAKE) Liqd ?Take 237 mLs by mouth 2 (two) times daily between meals. ?  ?levofloxacin 500 MG tablet ?Commonly known as: Levaquin ?Take 1 tablet (500 mg total) by mouth daily for 5 days. ?Start taking on: Sep 24, 2021 ?  ?meclizine 25 MG tablet ?Commonly known as: ANTIVERT ?Take 50 mg by mouth 2 (two) times daily as needed for dizziness. ?  ?metFORMIN 500 MG tablet ?Commonly known as: GLUCOPHAGE ?Take 500 mg by mouth daily. ?  ?metoprolol succinate 25 MG 24 hr tablet ?Commonly known as: TOPROL-XL ?Take 0.5 tablets (12.5 mg total) by mouth daily. ?  ?multivitamin tablet ?Take 1 tablet by mouth daily. ?  ?nitroGLYCERIN 0.4 MG SL tablet ?Commonly known as: NITROSTAT ?Place 0.4 mg under the tongue every 5 (five) minutes as needed for chest pain. ?  ?pantoprazole 40 MG tablet ?Commonly known as: PROTONIX ?Take 40 mg by mouth daily. ?  ?PHENobarbital 64.8 MG tablet ?Commonly known as: LUMINAL ?Take 2 tablets (129.6 mg total) by mouth at bedtime. ?  ?phenytoin 100 MG ER capsule ?Commonly known as: DILANTIN ?Take 300 mg by mouth at bedtime. ?  ?polyethylene glycol 17 g packet ?Commonly known as: MIRALAX / GLYCOLAX ?Take 17 g by mouth daily. ?  ?PROSTAT PO ?Take 30 mLs by mouth daily. ?  ?senna-docusate 8.6-50 MG tablet ?Commonly known as: Senokot-S ?Take 1 tablet by mouth at bedtime. ?  ? ?  ? ? ?Discharge Exam: ?Danley Danker Weights  ? 09/18/21 1654  ?Weight: 70.3 kg  ? ?General: Alert and oriented x2, no acute distress ?Cardiovascular: Regular rate and rhythm, S1-S2 ?Lungs: Clear to auscultation bilaterally ? ?Condition at discharge:  good ? ?The results of significant diagnostics from this hospitalization (including imaging, microbiology, ancillary and laboratory) are listed below for reference.  ? ?Imaging Studies: ?DG Chest 2 View ? ?Result Date: 09/18/2021 ?CLINICAL DATA:  Sepsis.  Fever. EXAM: CHEST - 2 VIEW COMPARISON:  08/05/2021 FINDINGS: Stable asymmetric elevation left hemidiaphragm with gaseous distension of bowel under the left hemidiaphragm. There is atelectasis at the left base similar to prior. Right lung clear. Cardiopericardial silhouette is at upper limits of normal for size. Bones are diffusely demineralized. Posttraumatic deformity noted right humeral neck and multiple left ribs. IMPRESSION: Asymmetric elevation left hemidiaphragm with left base atelectasis. No new or progressive findings. Electronically Signed   By: Misty Stanley M.D.   On: 09/18/2021 17:39  ? ?CT HEAD WO CONTRAST (5MM) ? ?Result Date: 09/18/2021 ?CLINICAL DATA:  Trauma EXAM: CT HEAD WITHOUT CONTRAST TECHNIQUE: Contiguous axial images were obtained from the base of the skull through the vertex without intravenous contrast. RADIATION DOSE REDUCTION: This exam was performed according to the departmental dose-optimization program which  includes automated exposure control, adjustment of the mA and/or kV according to patient size and/or use of iterative reconstruction technique. COMPARISON:  07/19/2021 FINDINGS: Brain: No acute intracranial findings are seen. There are no signs of bleeding within the cranium. Cortical sulci are prominent. There is decreased density in the periventricular white matter. Ventricles are not dilated. Vascular: Scattered arterial calcifications are seen. Skull: No fracture is seen in the calvarium. There is small 2 mm calcific density in the soft tissues anterior to the left frontal sinus which has not changed significantly. Sinuses/Orbits: There is mucosal thickening in the ethmoid sinus. Other: None IMPRESSION: No acute intracranial  findings are seen in noncontrast CT brain. Atrophy. Chronic ethmoid sinusitis. Electronically Signed   By: Elmer Picker M.D.   On: 09/18/2021 17:27  ? ?CT Cervical Spine Wo Contrast ? ?Result Date: 09/18/2021 ?CLINICAL DATA

## 2021-09-23 NOTE — Assessment & Plan Note (Signed)
Meets criteria BMI greater than 25 

## 2021-09-23 NOTE — Progress Notes (Signed)
Patient being discharged home. Ivs removed. Went over discharge instructions with patient and patients daughter. Patient going POV with daughter. ?

## 2021-09-23 NOTE — Care Management Important Message (Signed)
Important Message ? ?Patient Details  ?Name: Edward Thompson ?MRN: [id number] ?Date of Birth: 12/22/1950 ? ? ?Medicare Important Message Given:  Other (see comment) ? ?Patient is in an isolation room so I called his room to review the Important Message from Medicare with him but there was no answer.  Will try again.  ? ? ?Juliann Pulse A Itza Maniaci ?09/23/2021, 11:03 AM ?

## 2021-09-23 NOTE — TOC Transition Note (Signed)
Transition of Care (TOC) - CM/SW Discharge Note ? ? ?Patient Details  ?Name: Edward Thompson ?MRN: [id number] ?Date of Birth: 05-26-1950 ? ?Transition of Care (TOC) CM/SW Contact:  ?Edward Chroman, LCSW ?Phone Number: ?09/23/2021, 2:24 PM ? ? ?Clinical Narrative:   Patient has orders to discharge home today. Readmission prevention screen complete. CSW met with patient. No supports at bedside. CSW introduced role and explained that discharge planning would be discussed. PCP is Dr. Elijio Thompson. Daughter transports him to appointments. Pharmacy is [facility]. No issues obtaining medications. He was getting home health PT and RN through [facility] prior to admission but now wants a different agency. Reviewed CMS scores for the other two agencies that accept [facility] Medication. He prefers [facility]. Referral accepted for PT, OT, RN, aide. Patient has a BSC, shower chair, wheelchair, and rollator at home. Daughter has been updated. No further concerns. CSW signing off. ? ?Final next level of care: [place] ?Barriers to Discharge: No Barriers Identified ? ? ?Patient Goals and CMS Choice ?  ?CMS Medicare.gov Compare Post Acute Care list provided to:: Patient ?Choice offered to / list presented to : Patient ? ?Discharge Placement ?  ?           ?  ?Patient to be transferred to facility by: Daughter ?Name of family member notified: Edward Thompson ?Patient and family notified of of transfer: 09/23/21 ? ?Discharge Plan and Services ?  ?  ?           ?  ?  ?  ?  ?  ?HH Arranged: RN, PT, OT, Nurse's Aide ?[place] Agency: [place] ?Date HH Agency Contacted: 09/23/21 ?  ?Representative spoke with at [place]: Edward Thompson ? ?Social Determinants of Health (SDOH) Interventions ?  ? ? ?Readmission Risk Interventions ? ?  08/07/2021  ?  9:31 AM  ?Readmission Risk Prevention Plan  ?Transportation Screening Complete  ?PCP or Specialist Appt within 5-7 Days Complete  ?Home Care Screening  Complete  ?Medication Review (RN CM) Complete  ? ? ? ? ? ?

## 2021-09-24 DIAGNOSIS — S52264D Nondisplaced segmental fracture of shaft of ulna, right arm, subsequent encounter for closed fracture with routine healing: Secondary | ICD-10-CM | POA: Diagnosis not present

## 2021-09-25 LAB — CULTURE, BLOOD (ROUTINE X 2)
Culture: NO GROWTH
Culture: NO GROWTH
Special Requests: ADEQUATE
Special Requests: ADEQUATE

## 2021-09-29 DIAGNOSIS — E785 Hyperlipidemia, unspecified: Secondary | ICD-10-CM | POA: Diagnosis not present

## 2021-09-29 DIAGNOSIS — S0083XA Contusion of other part of head, initial encounter: Secondary | ICD-10-CM | POA: Diagnosis not present

## 2021-09-29 DIAGNOSIS — D649 Anemia, unspecified: Secondary | ICD-10-CM | POA: Diagnosis not present

## 2021-09-29 DIAGNOSIS — G4733 Obstructive sleep apnea (adult) (pediatric): Secondary | ICD-10-CM | POA: Diagnosis not present

## 2021-09-29 DIAGNOSIS — G40309 Generalized idiopathic epilepsy and epileptic syndromes, not intractable, without status epilepticus: Secondary | ICD-10-CM | POA: Diagnosis not present

## 2021-09-29 DIAGNOSIS — I10 Essential (primary) hypertension: Secondary | ICD-10-CM | POA: Diagnosis not present

## 2021-09-29 DIAGNOSIS — K2101 Gastro-esophageal reflux disease with esophagitis, with bleeding: Secondary | ICD-10-CM | POA: Diagnosis not present

## 2021-09-29 DIAGNOSIS — F329 Major depressive disorder, single episode, unspecified: Secondary | ICD-10-CM | POA: Diagnosis not present

## 2021-09-29 DIAGNOSIS — E118 Type 2 diabetes mellitus with unspecified complications: Secondary | ICD-10-CM | POA: Diagnosis not present

## 2021-10-22 DIAGNOSIS — R569 Unspecified convulsions: Secondary | ICD-10-CM | POA: Diagnosis not present

## 2021-10-22 DIAGNOSIS — R413 Other amnesia: Secondary | ICD-10-CM | POA: Diagnosis not present

## 2021-10-22 DIAGNOSIS — M21379 Foot drop, unspecified foot: Secondary | ICD-10-CM | POA: Diagnosis not present

## 2021-10-22 DIAGNOSIS — Z9989 Dependence on other enabling machines and devices: Secondary | ICD-10-CM | POA: Diagnosis not present

## 2021-10-22 DIAGNOSIS — R296 Repeated falls: Secondary | ICD-10-CM | POA: Diagnosis not present

## 2021-10-22 DIAGNOSIS — G4733 Obstructive sleep apnea (adult) (pediatric): Secondary | ICD-10-CM | POA: Diagnosis not present

## 2021-10-28 ENCOUNTER — Other Ambulatory Visit: Payer: Self-pay | Admitting: Neurology

## 2021-10-28 DIAGNOSIS — R413 Other amnesia: Secondary | ICD-10-CM

## 2021-10-28 DIAGNOSIS — R569 Unspecified convulsions: Secondary | ICD-10-CM

## 2021-10-28 DIAGNOSIS — M21379 Foot drop, unspecified foot: Secondary | ICD-10-CM

## 2021-10-30 DIAGNOSIS — R569 Unspecified convulsions: Secondary | ICD-10-CM | POA: Diagnosis not present

## 2021-11-05 DIAGNOSIS — I219 Acute myocardial infarction, unspecified: Secondary | ICD-10-CM | POA: Diagnosis not present

## 2021-11-05 DIAGNOSIS — I2510 Atherosclerotic heart disease of native coronary artery without angina pectoris: Secondary | ICD-10-CM | POA: Diagnosis not present

## 2021-11-05 DIAGNOSIS — I10 Essential (primary) hypertension: Secondary | ICD-10-CM | POA: Diagnosis not present

## 2021-11-05 DIAGNOSIS — I351 Nonrheumatic aortic (valve) insufficiency: Secondary | ICD-10-CM | POA: Diagnosis not present

## 2021-11-05 DIAGNOSIS — E785 Hyperlipidemia, unspecified: Secondary | ICD-10-CM | POA: Diagnosis not present

## 2021-11-05 DIAGNOSIS — I340 Nonrheumatic mitral (valve) insufficiency: Secondary | ICD-10-CM | POA: Diagnosis not present

## 2021-11-19 DIAGNOSIS — S52264D Nondisplaced segmental fracture of shaft of ulna, right arm, subsequent encounter for closed fracture with routine healing: Secondary | ICD-10-CM | POA: Diagnosis not present

## 2021-11-24 ENCOUNTER — Ambulatory Visit
Admission: RE | Admit: 2021-11-24 | Discharge: 2021-11-24 | Disposition: A | Discharge status: Home / Self Care | Payer: Medicare HMO | Source: Ambulatory Visit | Attending: Neurology | Admitting: Neurology

## 2021-11-24 DIAGNOSIS — R569 Unspecified convulsions: Secondary | ICD-10-CM | POA: Insufficient documentation

## 2021-11-24 DIAGNOSIS — R413 Other amnesia: Secondary | ICD-10-CM | POA: Insufficient documentation

## 2021-11-24 DIAGNOSIS — M21379 Foot drop, unspecified foot: Secondary | ICD-10-CM | POA: Insufficient documentation

## 2021-12-11 DIAGNOSIS — R531 Weakness: Secondary | ICD-10-CM | POA: Diagnosis not present

## 2021-12-11 DIAGNOSIS — G629 Polyneuropathy, unspecified: Secondary | ICD-10-CM | POA: Diagnosis not present

## 2021-12-29 DIAGNOSIS — E118 Type 2 diabetes mellitus with unspecified complications: Secondary | ICD-10-CM | POA: Diagnosis not present

## 2021-12-29 DIAGNOSIS — G40309 Generalized idiopathic epilepsy and epileptic syndromes, not intractable, without status epilepticus: Secondary | ICD-10-CM | POA: Diagnosis not present

## 2021-12-29 DIAGNOSIS — I10 Essential (primary) hypertension: Secondary | ICD-10-CM | POA: Diagnosis not present

## 2022-01-01 DIAGNOSIS — G4733 Obstructive sleep apnea (adult) (pediatric): Secondary | ICD-10-CM | POA: Diagnosis not present

## 2022-01-01 DIAGNOSIS — E118 Type 2 diabetes mellitus with unspecified complications: Secondary | ICD-10-CM | POA: Diagnosis not present

## 2022-01-01 DIAGNOSIS — F329 Major depressive disorder, single episode, unspecified: Secondary | ICD-10-CM | POA: Diagnosis not present

## 2022-01-01 DIAGNOSIS — N3090 Cystitis, unspecified without hematuria: Secondary | ICD-10-CM | POA: Diagnosis not present

## 2022-01-01 DIAGNOSIS — G40309 Generalized idiopathic epilepsy and epileptic syndromes, not intractable, without status epilepticus: Secondary | ICD-10-CM | POA: Diagnosis not present

## 2022-01-01 DIAGNOSIS — I872 Venous insufficiency (chronic) (peripheral): Secondary | ICD-10-CM | POA: Diagnosis not present

## 2022-01-01 DIAGNOSIS — E785 Hyperlipidemia, unspecified: Secondary | ICD-10-CM | POA: Diagnosis not present

## 2022-01-01 DIAGNOSIS — E1342 Other specified diabetes mellitus with diabetic polyneuropathy: Secondary | ICD-10-CM | POA: Diagnosis not present

## 2022-01-01 DIAGNOSIS — I10 Essential (primary) hypertension: Secondary | ICD-10-CM | POA: Diagnosis not present

## 2022-01-04 DIAGNOSIS — N3090 Cystitis, unspecified without hematuria: Secondary | ICD-10-CM | POA: Diagnosis not present

## 2022-01-07 ENCOUNTER — Emergency Department
Admission: EM | Admit: 2022-01-07 | Discharge: 2022-01-07 | Disposition: A | Discharge status: Home / Self Care | Payer: Medicare HMO | Attending: Emergency Medicine | Admitting: Emergency Medicine

## 2022-01-07 ENCOUNTER — Emergency Department: Payer: Medicare HMO

## 2022-01-07 ENCOUNTER — Encounter: Payer: Self-pay | Admitting: Emergency Medicine

## 2022-01-07 DIAGNOSIS — R9431 Abnormal electrocardiogram [ECG] [EKG]: Secondary | ICD-10-CM | POA: Diagnosis not present

## 2022-01-07 DIAGNOSIS — M542 Cervicalgia: Secondary | ICD-10-CM | POA: Diagnosis not present

## 2022-01-07 DIAGNOSIS — I10 Essential (primary) hypertension: Secondary | ICD-10-CM | POA: Diagnosis not present

## 2022-01-07 DIAGNOSIS — I6782 Cerebral ischemia: Secondary | ICD-10-CM | POA: Insufficient documentation

## 2022-01-07 DIAGNOSIS — G4489 Other headache syndrome: Secondary | ICD-10-CM | POA: Diagnosis not present

## 2022-01-07 DIAGNOSIS — R7989 Other specified abnormal findings of blood chemistry: Secondary | ICD-10-CM | POA: Insufficient documentation

## 2022-01-07 DIAGNOSIS — R4182 Altered mental status, unspecified: Secondary | ICD-10-CM | POA: Diagnosis not present

## 2022-01-07 DIAGNOSIS — I252 Old myocardial infarction: Secondary | ICD-10-CM | POA: Diagnosis not present

## 2022-01-07 DIAGNOSIS — R519 Headache, unspecified: Secondary | ICD-10-CM | POA: Diagnosis not present

## 2022-01-07 DIAGNOSIS — R0902 Hypoxemia: Secondary | ICD-10-CM | POA: Diagnosis not present

## 2022-01-07 LAB — BASIC METABOLIC PANEL
Anion gap: 7 (ref 5–15)
BUN: 22 mg/dL (ref 8–23)
CO2: 26 mmol/L (ref 22–32)
Calcium: 8.8 mg/dL — ABNORMAL LOW (ref 8.9–10.3)
Chloride: 101 mmol/L (ref 98–111)
Creatinine, Ser: 0.62 mg/dL (ref 0.61–1.24)
GFR, Estimated: >60 mL/min (ref >60)
Glucose, Bld: 153 mg/dL — ABNORMAL HIGH (ref 70–99)
Potassium: 3.8 mmol/L (ref 3.5–5.1)
Sodium: 134 mmol/L — ABNORMAL LOW (ref 135–145)

## 2022-01-07 LAB — CBC
HCT: 39.7 % (ref 39.0–52.0)
Hemoglobin: 13 g/dL (ref 13.0–17.0)
MCH: 30.7 pg (ref 26.0–34.0)
MCHC: 32.7 g/dL (ref 30.0–36.0)
MCV: 93.9 fL (ref 80.0–100.0)
Platelets: 182 10*3/uL (ref 150–400)
RBC: 4.23 MIL/uL (ref 4.22–5.81)
RDW: 12.8 % (ref 11.5–15.5)
WBC: 6.3 10*3/uL (ref 4.0–10.5)
nRBC: 0 % (ref 0.0–0.2)

## 2022-01-07 LAB — URINALYSIS, ROUTINE W REFLEX MICROSCOPIC
Bilirubin Urine: NEGATIVE
Glucose, UA: NEGATIVE mg/dL
Hgb urine dipstick: NEGATIVE
Ketones, ur: NEGATIVE mg/dL
Leukocytes,Ua: NEGATIVE
Nitrite: NEGATIVE
Protein, ur: NEGATIVE mg/dL
Specific Gravity, Urine: 1.005 (ref 1.005–1.030)
pH: 7 (ref 5.0–8.0)

## 2022-01-07 LAB — CBG MONITORING, ED: Glucose-Capillary: 189 mg/dL — ABNORMAL HIGH (ref 70–99)

## 2022-01-07 LAB — TROPONIN I (HIGH SENSITIVITY)
Troponin I (High Sensitivity): 3 ng/L (ref <18)
Troponin I (High Sensitivity): 4 ng/L (ref <18)

## 2022-01-07 MED ORDER — CEPHALEXIN 500 MG PO CAPS: 500.0000 mg | ORAL_CAPSULE | Freq: Four times a day (QID) | ORAL | 0 refills | Status: AC

## 2022-01-07 NOTE — ED Notes (Signed)
Pts daughter here to pick up pt. Pts daughter requesting to speak with MD

## 2022-01-07 NOTE — ED Triage Notes (Signed)
Pt arrived via [facility] from home where pt called out due to HA and concern of hyperglycemia since 2300 last night. Pt sts his glucometer not working correctly at home and pt consumed food in thoughts his HA was due to low sugar readings but pain continued. Significant swelling noted to bilateral feet with hx/o same and pt reports he has not been elevating them.

## 2022-01-07 NOTE — ED Notes (Signed)
Pt states his daughter is trying to get off work to come get him. Charge RN notified of pts difficulty obtaining a ride home.

## 2022-01-07 NOTE — ED Provider Notes (Addendum)
[facility] Provider Note    Event Date/Time   First MD Initiated Contact with Patient 01/07/22 0802     (approximate)   History   Headache   HPI  Edward Thompson is a 71 y.o. male  who presents to the emergency department today because of concern for pain to his head. The patient states it is not a headache but a pain in his head. Located on the left side of his head. Started last night. Did not have any associated blurred vision or weakness. Did have concern that his blood sugar might be low since his wife would get headaches when her blood sugar went low. The patient ate a large meal. At the time of my exam he does state that he is feeling better.     Physical Exam   Triage Vital Signs: ED Triage Vitals [01/07/22 0240]  Enc Vitals Group     BP (!) 167/79     Pulse Rate 77     Resp 18     Temp 98.3 F (36.8 C)     Temp Source Oral     SpO2 96 %     Weight      Height      Head Circumference      Peak Flow      Pain Score      Pain Loc      Pain Edu?      Excl. in GC?     Most recent vital signs: Vitals:   01/07/22 0240 01/07/22 0503  BP: (!) 167/79 (!) 175/80  Pulse: 77 88  Resp: 18 16  Temp: 98.3 F (36.8 C) 97.6 F (36.4 C)  SpO2: 96% 100%    General: Awake, alert, oriented. CV:  Good peripheral perfusion. Regular rate and rhythm. Resp:  Normal effort. Lungs clear. Abd:  No distention.    ED Results / Procedures / Treatments   Labs (all labs ordered are listed, but only abnormal results are displayed) Labs Reviewed  BASIC METABOLIC PANEL - Abnormal; Notable for the following components:      Result Value   Sodium 134 (*)    Glucose, Bld 153 (*)    Calcium 8.8 (*)    All other components within normal limits  URINALYSIS, ROUTINE W REFLEX MICROSCOPIC - Abnormal; Notable for the following components:   Color, Urine STRAW (*)    APPearance CLEAR (*)    All other components within normal limits  CBG MONITORING, ED -  Abnormal; Notable for the following components:   Glucose-Capillary 189 (*)    All other components within normal limits  CBC  CBG MONITORING, ED  CBG MONITORING, ED  TROPONIN I (HIGH SENSITIVITY)  TROPONIN I (HIGH SENSITIVITY)     EKG  I, Phineas Semen, attending physician, personally viewed and interpreted this EKG  EKG Time: 0411 Rate: 81 Rhythm: normal sinus rhythm Axis: left axis deviation Intervals: qtc 432 QRS: narrow ST changes: no st elevation Impression: abnormal ekg  RADIOLOGY I independently interpreted and visualized the CT head. My interpretation: No bleed. Radiology interpretation:  IMPRESSION:  1. No acute intracranial abnormality.  2. Generalized cerebral atrophy with chronic white matter small  vessel ischemic changes.      PROCEDURES:  Critical Care performed: No  Procedures   MEDICATIONS ORDERED IN ED: Medications - No data to display   IMPRESSION / MDM / ASSESSMENT AND PLAN / ED COURSE  I reviewed the triage vital signs and  the nursing notes.                              Differential diagnosis includes, but is not limited to, tension headache, bleed.  Patient's presentation is most consistent with acute presentation with potential threat to life or bodily function.  Patient presented to the emergency department today because of concerns for pain to the left side of his head that started last night.  Patient is pain-free at the time my exam.  Head CT was obtained did not show any concerning bleed or abnormality.  Additionally blood work without concerning electrolyte abnormality.  Given reassuring head CT and blood work and the patient is clinically feeling better this time is reasonable for patient be discharged home.  I did encourage patient to follow-up with primary care.  Additionally patient has a neurologist and I recommended neurologic evaluation if headaches returned.    When daughter presented to take patient home she did voice  concern for possible cellulitis in the right lower leg. While both legs were swollen, did not see any erythema over right foot or leg to suggest cellulitis. Patient does have some small lesions to the top of the foot but again no surrounding erythema, however given families concern will give prescription for antibiotics.   FINAL CLINICAL IMPRESSION(S) / ED DIAGNOSES   Final diagnoses:  Nonintractable headache, unspecified chronicity pattern, unspecified headache type      Note:  This document was prepared using Dragon voice recognition software and may include unintentional dictation errors.    Phineas Semen, MD 01/07/22 [id number]    Phineas Semen, MD 01/07/22 [phone]

## 2022-01-07 NOTE — Discharge Instructions (Signed)
Please seek medical attention for any high fevers, chest pain, shortness of breath, change in behavior, persistent vomiting, bloody stool or any other new or concerning symptoms.  

## 2022-01-07 NOTE — ED Notes (Signed)
MD at bedside. 

## 2022-01-08 DIAGNOSIS — R519 Headache, unspecified: Secondary | ICD-10-CM | POA: Diagnosis not present

## 2022-01-14 DIAGNOSIS — R339 Retention of urine, unspecified: Secondary | ICD-10-CM | POA: Diagnosis not present

## 2022-01-15 ENCOUNTER — Other Ambulatory Visit: Payer: Self-pay

## 2022-01-15 ENCOUNTER — Emergency Department: Payer: Medicare HMO

## 2022-01-15 ENCOUNTER — Emergency Department
Admission: EM | Admit: 2022-01-15 | Discharge: 2022-01-15 | Disposition: A | Discharge status: Home / Self Care | Payer: Medicare HMO | Attending: Emergency Medicine | Admitting: Emergency Medicine

## 2022-01-15 DIAGNOSIS — I2510 Atherosclerotic heart disease of native coronary artery without angina pectoris: Secondary | ICD-10-CM | POA: Diagnosis not present

## 2022-01-15 DIAGNOSIS — R519 Headache, unspecified: Secondary | ICD-10-CM | POA: Insufficient documentation

## 2022-01-15 DIAGNOSIS — G4489 Other headache syndrome: Secondary | ICD-10-CM | POA: Diagnosis not present

## 2022-01-15 DIAGNOSIS — E871 Hypo-osmolality and hyponatremia: Secondary | ICD-10-CM | POA: Insufficient documentation

## 2022-01-15 DIAGNOSIS — R609 Edema, unspecified: Secondary | ICD-10-CM | POA: Diagnosis not present

## 2022-01-15 DIAGNOSIS — I10 Essential (primary) hypertension: Secondary | ICD-10-CM | POA: Diagnosis not present

## 2022-01-15 LAB — COMPREHENSIVE METABOLIC PANEL
ALT: 25 U/L (ref 0–44)
AST: 26 U/L (ref 15–41)
Albumin: 4 g/dL (ref 3.5–5.0)
Alkaline Phosphatase: 88 U/L (ref 38–126)
Anion gap: 8 (ref 5–15)
BUN: 18 mg/dL (ref 8–23)
CO2: 26 mmol/L (ref 22–32)
Calcium: 9.2 mg/dL (ref 8.9–10.3)
Chloride: 98 mmol/L (ref 98–111)
Creatinine, Ser: 0.56 mg/dL — ABNORMAL LOW (ref 0.61–1.24)
GFR, Estimated: >60 mL/min (ref >60)
Glucose, Bld: 110 mg/dL — ABNORMAL HIGH (ref 70–99)
Potassium: 4 mmol/L (ref 3.5–5.1)
Sodium: 132 mmol/L — ABNORMAL LOW (ref 135–145)
Total Bilirubin: 0.4 mg/dL (ref 0.3–1.2)
Total Protein: 7.3 g/dL (ref 6.5–8.1)

## 2022-01-15 LAB — CBC WITH DIFFERENTIAL/PLATELET
Abs Immature Granulocytes: 0.02 10*3/uL (ref 0.00–0.07)
Basophils Absolute: 0 10*3/uL (ref 0.0–0.1)
Basophils Relative: 0 %
Eosinophils Absolute: 0.2 10*3/uL (ref 0.0–0.5)
Eosinophils Relative: 3 %
HCT: 38.3 % — ABNORMAL LOW (ref 39.0–52.0)
Hemoglobin: 12.5 g/dL — ABNORMAL LOW (ref 13.0–17.0)
Immature Granulocytes: 0 %
Lymphocytes Relative: 36 %
Lymphs Abs: 2.1 10*3/uL (ref 0.7–4.0)
MCH: 30.2 pg (ref 26.0–34.0)
MCHC: 32.6 g/dL (ref 30.0–36.0)
MCV: 92.5 fL (ref 80.0–100.0)
Monocytes Absolute: 0.7 10*3/uL (ref 0.1–1.0)
Monocytes Relative: 11 %
Neutro Abs: 2.9 10*3/uL (ref 1.7–7.7)
Neutrophils Relative %: 50 %
Platelets: 180 10*3/uL (ref 150–400)
RBC: 4.14 MIL/uL — ABNORMAL LOW (ref 4.22–5.81)
RDW: 12.7 % (ref 11.5–15.5)
WBC: 5.9 10*3/uL (ref 4.0–10.5)
nRBC: 0 % (ref 0.0–0.2)

## 2022-01-15 MED ORDER — ACETAMINOPHEN 500 MG PO TABS
1000.0000 mg | ORAL_TABLET | Freq: Once | ORAL | Status: AC
  Administered 2022-01-15: 1000 mg via ORAL
  Filled 2022-01-15: qty 2

## 2022-01-15 MED ORDER — MAGNESIUM SULFATE 2 GM/50ML IV SOLN
2.0000 g | Freq: Once | INTRAVENOUS | Status: AC
  Administered 2022-01-15: 2 g via INTRAVENOUS
  Filled 2022-01-15: qty 50

## 2022-01-15 MED ORDER — METOCLOPRAMIDE HCL 5 MG/ML IJ SOLN
10.0000 mg | Freq: Once | INTRAMUSCULAR | Status: AC
  Administered 2022-01-15: 10 mg via INTRAVENOUS
  Filled 2022-01-15: qty 2

## 2022-01-15 MED ORDER — SODIUM CHLORIDE 0.9 % IV BOLUS
500.0000 mL | Freq: Once | INTRAVENOUS | Status: AC
  Administered 2022-01-15: 500 mL via INTRAVENOUS

## 2022-01-15 MED ORDER — DIPHENHYDRAMINE HCL 50 MG/ML IJ SOLN
12.5000 mg | Freq: Once | INTRAMUSCULAR | Status: AC
  Administered 2022-01-15: 12.5 mg via INTRAVENOUS
  Filled 2022-01-15: qty 1

## 2022-01-15 NOTE — ED Provider Notes (Signed)
[facility] Provider Note    Event Date/Time   First MD Initiated Contact with Patient 01/15/22 [phone]     (approximate)   History   Headache   HPI  Edward Thompson is a 71 y.o. male   Past medical history of pretension, CAD with prior stenting, presents with left-sided headache that started this morning around 430 at rest.  It is similar to the headache that is experienced intermittently for the last several weeks, seen in the emergency department at the end of August with negative work-up and resolution of pain with medications in the emergency department.  Since that visit, he has been headache-free until this morning.  Denies preceding illnesses, specifically denying fever, chills, nausea or vomiting, chest pain, shortness of breath, abdominal pain.  No visual changes.  No weakness or sensory complaints.  No trauma or falls.  He is pre-occupied with his phone through much of our interview, though is cooperative w H&P after repeated requests.   His very focused on his blood pressure which she states is higher than normal at 170/90 and he has been compliant with his medications.  He lives at home with his daughter-in-law.  History was obtained via patient and external medical notes.      Physical Exam   Triage Vital Signs: ED Triage Vitals  Enc Vitals Group     BP 01/15/22 0649 (!) 170/93     Pulse Rate 01/15/22 0649 81     Resp 01/15/22 0649 20     Temp 01/15/22 0649 98.2 F (36.8 C)     Temp src --      SpO2 01/15/22 0649 99 %     Weight --      Height --      Head Circumference --      Peak Flow --      Pain Score 01/15/22 0654 8     Pain Loc --      Pain Edu? --      Excl. in GC? --     Most recent vital signs: Vitals:   01/15/22 0830 01/15/22 0900  BP: 121/64 (!) 125/59  Pulse: 71 65  Resp: 15 13  Temp:    SpO2: 98% 99%    General: Awake, no distress.  Conversant and pleasant. CV:  Good peripheral perfusion.  He has  bilateral edema to the ankles and lower legs which is chronic and improves with elevation, patient reports. Resp:  Normal effort.  Clear to auscultation bilaterally with wheeze/focality/rales Abd:  No distention. Non-tender  Other:  No focal neurological deficits, specifically finger-to-nose, no motor or sensory deficits in all 4 extremities, no facial asymmetry, pupils equal round and reactive, extraocular movements intact.  He is alert and oriented.  There is no temporal tenderness to palpation.  He has no skin lesions, the auditory canal is clear without lesions.  He has an old appearing bruise behind the left shoulder, joint can be ranged with full active range of motion.  Nontender.   ED Results / Procedures / Treatments   Labs (all labs ordered are listed, but only abnormal results are displayed) Labs Reviewed  COMPREHENSIVE METABOLIC PANEL - Abnormal; Notable for the following components:      Result Value   Sodium 132 (*)    Glucose, Bld 110 (*)    Creatinine, Ser 0.56 (*)    All other components within normal limits  CBC WITH DIFFERENTIAL/PLATELET - Abnormal; Notable for the following components:  RBC 4.14 (*)    Hemoglobin 12.5 (*)    HCT 38.3 (*)    All other components within normal limits     I reviewed labs and they are notable for WBC 5.9, Hgb 12.5 from 13.0, Mild hyponatremia 132  EKG  ED ECG REPORT I, Pilar Jarvis, the attending physician, personally viewed and interpreted this ECG.   Date: 01/15/2022  EKG Time: 0735  Rate: 73  Rhythm: normal EKG, normal sinus rhythm  Axis: normal  Intervals: qtc 463, no stemi; isolated TWI III    RADIOLOGY I independently reviewed and interpreted CT head wo contrast and see no obvious hemorrhage or midline shift.    PROCEDURES:  Critical Care performed: No  Procedures   MEDICATIONS ORDERED IN ED: Medications  magnesium sulfate IVPB 2 g 50 mL (2 g Intravenous New Bag/Given 01/15/22 0859)  acetaminophen  (TYLENOL) tablet 1,000 mg (1,000 mg Oral Given 01/15/22 0736)  sodium chloride 0.9 % bolus 500 mL (500 mLs Intravenous New Bag/Given 01/15/22 0858)  metoCLOPramide (REGLAN) injection 10 mg (10 mg Intravenous Given 01/15/22 0859)  diphenhydrAMINE (BENADRYL) injection 12.5 mg (12.5 mg Intravenous Given 01/15/22 0859)     IMPRESSION / MDM / ASSESSMENT AND PLAN / ED COURSE  I reviewed the triage vital signs and the nursing notes.                              Differential diagnosis includes, but is not limited to, intracranial bleeding, migraine headache, tension type headache, considered but less likely infectious etiology, temporal arteritis, zoster, trigeminal neuralgia, CVA, increased intracranial pressures.    MDM: This is a patient with atraumatic headache that was consistent with headaches in the past that responded to medications.  No trauma to suggest intracranial bleeding, though given his age and signs of old trauma of the shoulder I will obtain a CT head to assess for intracranial bleeding.  I doubt mass lesion, CVA, no focal neurological deficits per exam above.  Doubt zoster given benign exam.  Doubt temporal arteritis given on characteristic pain location and no temporal tenderness.  Doubt ophthalmologic problem, given no visual complaints.  9:33 AM CT neg.  Labs unremarkable Pt sleeping comfortably, BP improved.  I woke him to reassess, con't to appear well and HA improved with meds.     Dispo: After careful consideration of this patient's presentation, medical and social risk factors (lives at home with daughter in-law and has primary doctor) , and evaluation in the emergency department I engaged in shared decision making with the patient and/or their representative to consider admission or observation and this patient was ultimately discharged because of unremarkable work-up as above, resolution of headache, and safe discharge plan for home with daughter-in-law and PMD follow-up with  return to the emergency department if worsening.  Patient's presentation is most consistent with acute presentation with potential threat to life or bodily function.       FINAL CLINICAL IMPRESSION(S) / ED DIAGNOSES   Final diagnoses:  Acute nonintractable headache, unspecified headache type     Rx / DC Orders   ED Discharge Orders     None        Note:  This document was prepared using Dragon voice recognition software and may include unintentional dictation errors.    Pilar Jarvis, MD 01/15/22 [phone]

## 2022-01-15 NOTE — Discharge Instructions (Addendum)
Take acetaminophen 650 mg every 6 hours for pain.  Take with food.  Thank you for choosing [place] for your health care today!  Please see your primary doctor this week for a follow up appointment. Your sodium level was slightly low; please have your doctor recheck this blood test in the next week.   If you do not have a primary doctor call the following clinics to establish care:  If you have insurance:  [facility] [phone] [address] [place] [address]   [facility]  [phone] [address] [place] [address]   If you do not have insurance:  [facility]  [phone] [address], [place] [address]  Sometimes, in the early stages of certain disease courses it is difficult to detect in the emergency department evaluation -- so, it is important that you continue to monitor your symptoms and call your doctor right away or return to the emergency department if you develop any new or worsening symptoms.  It was my pleasure to care for you today.   Daneil Dan Modesto Charon, MD

## 2022-01-15 NOTE — ED Triage Notes (Signed)
Pt c/o headache and sts. BG 89 per EMS. Pt arrives from home and pt has severe edema to BLE and sts that it is chronic. Pt was seen for a HA on 01/07/22 in this ER.

## 2022-01-19 ENCOUNTER — Telehealth: Payer: Self-pay

## 2022-01-19 NOTE — Telephone Encounter (Signed)
     Patient  visit on 9/8  at [facility] was for headach  Have you been able to follow up with your primary care physician?yes  The patient was or was not able to obtain any needed medicine or equipment.yes  Are there diet recommendations that you are having difficulty following?na  Patient expresses understanding of discharge instructions and education provided has no other needs at this time. yes     Lenard Forth [facility], Care Management  [phone] [address] [place], [place], [place] [address] Phone: [phone] Email: [email or web address]

## 2022-01-20 DIAGNOSIS — R4781 Slurred speech: Secondary | ICD-10-CM | POA: Diagnosis not present

## 2022-01-20 DIAGNOSIS — M21379 Foot drop, unspecified foot: Secondary | ICD-10-CM | POA: Diagnosis not present

## 2022-01-20 DIAGNOSIS — R569 Unspecified convulsions: Secondary | ICD-10-CM | POA: Diagnosis not present

## 2022-01-20 DIAGNOSIS — G629 Polyneuropathy, unspecified: Secondary | ICD-10-CM | POA: Diagnosis not present

## 2022-01-20 DIAGNOSIS — G4733 Obstructive sleep apnea (adult) (pediatric): Secondary | ICD-10-CM | POA: Diagnosis not present

## 2022-01-20 DIAGNOSIS — R296 Repeated falls: Secondary | ICD-10-CM | POA: Diagnosis not present

## 2022-01-20 DIAGNOSIS — Z9989 Dependence on other enabling machines and devices: Secondary | ICD-10-CM | POA: Diagnosis not present

## 2022-01-20 DIAGNOSIS — R519 Headache, unspecified: Secondary | ICD-10-CM | POA: Diagnosis not present

## 2022-01-20 DIAGNOSIS — R413 Other amnesia: Secondary | ICD-10-CM | POA: Diagnosis not present

## 2022-01-20 DIAGNOSIS — R531 Weakness: Secondary | ICD-10-CM | POA: Diagnosis not present

## 2022-02-19 DIAGNOSIS — S52264D Nondisplaced segmental fracture of shaft of ulna, right arm, subsequent encounter for closed fracture with routine healing: Secondary | ICD-10-CM | POA: Diagnosis not present

## 2022-02-22 ENCOUNTER — Ambulatory Visit: Payer: Medicare HMO | Admitting: Physician Assistant

## 2022-02-23 ENCOUNTER — Ambulatory Visit (INDEPENDENT_AMBULATORY_CARE_PROVIDER_SITE_OTHER): Payer: Medicare HMO | Admitting: Physician Assistant

## 2022-02-23 VITALS — BP 113/61 | HR 60 | Ht 64.0 in | Wt 173.0 lb

## 2022-02-23 DIAGNOSIS — R339 Retention of urine, unspecified: Secondary | ICD-10-CM

## 2022-02-23 NOTE — Progress Notes (Signed)
02/23/2022 3:43 PM   Edward Thompson 09-20-1950 [id number]  CC: Chief Complaint  Patient presents with   Follow-up    6 month follow up    HPI: Edward Thompson is a 71 y.o. male with PMH urinary retention managed with [facility] who presents today for 71-monthfollow-up.  He is accompanied today by his daughter, MRip Thompson who contributes to HPI.  Today he reports he has been self catheterizing 6 times daily with a 12 French straight SpeediCath standard catheters.  He is able to advance these to the level of his bladder without difficulty.  He was treated for UTI by his PCP in August.  Symptoms were frequency and "acting off."  He denies bladder pain at that time.  He states his symptoms completely resolved with antibiotics and he is asymptomatic today.  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  They wonder if this could explain his urinary retention.  He underwent urodynamics in August 2022 with findings of a hyposensitive, unstable bladder.  Findings were equivocal for obstruction.  He had previously had mild lateral lobe enlargement on cystoscopy.  He was offered TUIP with an estimated 50 to 60% efficacy rate but declined this.  Today they are adamant that they were never informed that there could be an element of urinary obstruction leading to his urinary retention and they are rather upset about this.  The patient wishes to defer surgery at this time pending his genetic testing as above.  PMH: Past Medical History:  Diagnosis Date   CAD (coronary artery disease)    Carotid stenosis, left 08/2017   Diabetes mellitus without complication (HCC)    Foot drop    [place] ([facility]   fell off a roof. required halo x 4 months. also fractured alot of vertebrae   Heart attack ([facility] 2015   Heart disease    Hepatitis    6th grade    Hyperlipidemia    Hypertension    Myocardial infarction acute ([facility] 2015   Seizures ([facility]    taking phenobarbitol  and dilantin. LAST SEIZURE WAS 2016. well controlled on meds   Sleep apnea    USES CPAP   Syncope 2019   Vertigo    Viral meningitis     Surgical History: Past Surgical History:  Procedure Laterality Date   CARDIAC SURGERY     COLONOSCOPY     COLONOSCOPY WITH PROPOFOL N/A 03/11/2021   Procedure: COLONOSCOPY WITH PROPOFOL;  Surgeon: Toledo, TBenay Pike MD;  Location: [facility] ENDOSCOPY;  Service: Gastroenterology;  Laterality: N/A;   CORONARY ANGIOPLASTY     PATIENT UNAWARE OF THIS   CORONARY ARTERY BYPASS GRAFT  2015   ENDARTERECTOMY Left 08/19/2017   Procedure: ENDARTERECTOMY CAROTID;  Surgeon: SKatha Cabal MD;  Location: [facility] ORS;  Service: Vascular;  Laterality: Left;   ESOPHAGOGASTRODUODENOSCOPY N/A 03/11/2021   Procedure: ESOPHAGOGASTRODUODENOSCOPY (EGD);  Surgeon: Toledo, TBenay Pike MD;  Location: [facility] ENDOSCOPY;  Service: Gastroenterology;  Laterality: N/A;  DM   HERNIA REPAIR Right 1985   inguinal    Home Medications:  Allergies as of 02/23/2022   No Known Allergies      Medication List        Accurate as of February 23, 2022  3:43 PM. If you have any questions, ask your nurse or doctor.          Accu-Chek Guide test strip Generic drug: glucose blood   Accu-Chek Guide w/Device Kit  Accu-Chek Softclix Lancets lancets   acetaminophen 500 MG tablet Commonly known as: TYLENOL Take 500 mg by mouth every 6 (six) hours as needed.   amLODipine 5 MG tablet Commonly known as: NORVASC Take 0.5 tablets (2.5 mg total) by mouth daily.   aspirin EC 81 MG tablet Take 81 mg by mouth at bedtime.   escitalopram 10 MG tablet Commonly known as: LEXAPRO Take 10 mg by mouth daily.   feeding supplement (GLUCERNA SHAKE) Liqd Take 237 mLs by mouth 2 (two) times daily between meals.   meclizine 25 MG tablet Commonly known as: ANTIVERT Take 50 mg by mouth 2 (two) times daily as needed for dizziness.   metFORMIN 850 MG tablet Commonly known as: GLUCOPHAGE Take by  mouth.   metFORMIN 500 MG tablet Commonly known as: GLUCOPHAGE Take 500 mg by mouth daily.   metoprolol succinate 25 MG 24 hr tablet Commonly known as: TOPROL-XL Take 0.5 tablets (12.5 mg total) by mouth daily.   multivitamin tablet Take 1 tablet by mouth daily.   nitroGLYCERIN 0.4 MG SL tablet Commonly known as: NITROSTAT Place 0.4 mg under the tongue every 5 (five) minutes as needed for chest pain.   pantoprazole 40 MG tablet Commonly known as: PROTONIX Take 40 mg by mouth daily.   PHENobarbital 64.8 MG tablet Commonly known as: LUMINAL Take 2 tablets (129.6 mg total) by mouth at bedtime.   phenytoin 100 MG ER capsule Commonly known as: DILANTIN Take 300 mg by mouth at bedtime.   polyethylene glycol 17 g packet Commonly known as: MIRALAX / GLYCOLAX Take 17 g by mouth daily.   PROSTAT PO Take 30 mLs by mouth daily.   senna-docusate 8.6-50 MG tablet Commonly known as: Senokot-S Take 1 tablet by mouth at bedtime.        Allergies:  No Known Allergies  Family History: Family History  Problem Relation Age of Onset   Heart disease Mother    Heart attack Father     Social History:   reports that he has never smoked. He has never used smokeless tobacco. He reports that he does not drink alcohol and does not use drugs.  Physical Exam: BP 113/61   Pulse 60   Ht _0  (1.626 m)   Wt 173 lb (78.5 kg)   BMI 29.70 kg/m   Constitutional:  Alert and oriented, no acute distress, nontoxic appearing HEENT: [place], AT Cardiovascular: No clubbing, cyanosis, or edema Respiratory: Normal respiratory effort, no increased work of breathing Skin: No rashes, bruises or suspicious lesions Neurologic: In wheelchair Psychiatric: Normal mood and affect  Assessment & Plan:   1. Urinary retention Well managed with [facility] 6 times daily.  We will plan to continue this.  I had a lengthy conversation with the patient and his daughter today revisiting his urodynamics results, which  are consistent with a mixed picture of hyposensitive bladder and urinary obstruction.  We reviewed that the TUIP procedure they were offered is a prostate procedure that would address the obstructive aspect of his urinary retention, but it would not address a hyposensitive bladder.  We discussed that there is no surgical intervention to address a hyposensitive bladder.  We discussed that there was only a 50 to 60% chance that TUIP could get him voiding spontaneously and that if he is found to have a neurodegenerative disorder, he could have recurrent urinary retention in the future related to hyposensitive bladder regardless of obstruction.  Agree with deferring TUIP for now.  We discussed that  he may return to clinic as needed if he wishes to revisit this.  Return in about 1 year (around 02/24/2023) for 1 year follow up .  Debroah Loop, PA-C  [facility] [address], [place], [place] [address] [phone]

## 2022-03-08 ENCOUNTER — Encounter (INDEPENDENT_AMBULATORY_CARE_PROVIDER_SITE_OTHER): Payer: Self-pay

## 2022-03-10 DIAGNOSIS — I351 Nonrheumatic aortic (valve) insufficiency: Secondary | ICD-10-CM | POA: Diagnosis not present

## 2022-03-10 DIAGNOSIS — I340 Nonrheumatic mitral (valve) insufficiency: Secondary | ICD-10-CM | POA: Diagnosis not present

## 2022-03-10 DIAGNOSIS — I2510 Atherosclerotic heart disease of native coronary artery without angina pectoris: Secondary | ICD-10-CM | POA: Diagnosis not present

## 2022-03-10 DIAGNOSIS — E119 Type 2 diabetes mellitus without complications: Secondary | ICD-10-CM | POA: Diagnosis not present

## 2022-03-10 DIAGNOSIS — I10 Essential (primary) hypertension: Secondary | ICD-10-CM | POA: Diagnosis not present

## 2022-03-10 DIAGNOSIS — G4730 Sleep apnea, unspecified: Secondary | ICD-10-CM | POA: Diagnosis not present

## 2022-03-10 DIAGNOSIS — I739 Peripheral vascular disease, unspecified: Secondary | ICD-10-CM | POA: Diagnosis not present

## 2022-03-10 DIAGNOSIS — E785 Hyperlipidemia, unspecified: Secondary | ICD-10-CM | POA: Diagnosis not present

## 2022-03-12 DIAGNOSIS — R82998 Other abnormal findings in urine: Secondary | ICD-10-CM | POA: Diagnosis not present

## 2022-03-12 DIAGNOSIS — N390 Urinary tract infection, site not specified: Secondary | ICD-10-CM | POA: Diagnosis not present

## 2022-04-06 DIAGNOSIS — I10 Essential (primary) hypertension: Secondary | ICD-10-CM | POA: Diagnosis not present

## 2022-04-06 DIAGNOSIS — G40309 Generalized idiopathic epilepsy and epileptic syndromes, not intractable, without status epilepticus: Secondary | ICD-10-CM | POA: Diagnosis not present

## 2022-04-06 DIAGNOSIS — E785 Hyperlipidemia, unspecified: Secondary | ICD-10-CM | POA: Diagnosis not present

## 2022-04-09 DIAGNOSIS — E1342 Other specified diabetes mellitus with diabetic polyneuropathy: Secondary | ICD-10-CM | POA: Diagnosis not present

## 2022-04-09 DIAGNOSIS — I872 Venous insufficiency (chronic) (peripheral): Secondary | ICD-10-CM | POA: Diagnosis not present

## 2022-04-09 DIAGNOSIS — E118 Type 2 diabetes mellitus with unspecified complications: Secondary | ICD-10-CM | POA: Diagnosis not present

## 2022-04-09 DIAGNOSIS — F329 Major depressive disorder, single episode, unspecified: Secondary | ICD-10-CM | POA: Diagnosis not present

## 2022-04-09 DIAGNOSIS — Z23 Encounter for immunization: Secondary | ICD-10-CM | POA: Diagnosis not present

## 2022-04-09 DIAGNOSIS — G4733 Obstructive sleep apnea (adult) (pediatric): Secondary | ICD-10-CM | POA: Diagnosis not present

## 2022-04-09 DIAGNOSIS — G40309 Generalized idiopathic epilepsy and epileptic syndromes, not intractable, without status epilepticus: Secondary | ICD-10-CM | POA: Diagnosis not present

## 2022-04-09 DIAGNOSIS — E785 Hyperlipidemia, unspecified: Secondary | ICD-10-CM | POA: Diagnosis not present

## 2022-04-09 DIAGNOSIS — I10 Essential (primary) hypertension: Secondary | ICD-10-CM | POA: Diagnosis not present

## 2022-04-09 DIAGNOSIS — N3090 Cystitis, unspecified without hematuria: Secondary | ICD-10-CM | POA: Diagnosis not present

## 2022-04-13 DIAGNOSIS — R339 Retention of urine, unspecified: Secondary | ICD-10-CM | POA: Diagnosis not present

## 2022-04-22 DIAGNOSIS — R296 Repeated falls: Secondary | ICD-10-CM | POA: Diagnosis not present

## 2022-04-22 DIAGNOSIS — G629 Polyneuropathy, unspecified: Secondary | ICD-10-CM | POA: Diagnosis not present

## 2022-04-22 DIAGNOSIS — R4781 Slurred speech: Secondary | ICD-10-CM | POA: Diagnosis not present

## 2022-04-22 DIAGNOSIS — R569 Unspecified convulsions: Secondary | ICD-10-CM | POA: Diagnosis not present

## 2022-04-22 DIAGNOSIS — R531 Weakness: Secondary | ICD-10-CM | POA: Diagnosis not present

## 2022-04-22 DIAGNOSIS — R413 Other amnesia: Secondary | ICD-10-CM | POA: Diagnosis not present

## 2022-04-22 DIAGNOSIS — M21379 Foot drop, unspecified foot: Secondary | ICD-10-CM | POA: Diagnosis not present

## 2022-05-05 DIAGNOSIS — H524 Presbyopia: Secondary | ICD-10-CM | POA: Diagnosis not present

## 2022-05-05 DIAGNOSIS — H52222 Regular astigmatism, left eye: Secondary | ICD-10-CM | POA: Diagnosis not present

## 2022-06-14 ENCOUNTER — Other Ambulatory Visit: Payer: Self-pay

## 2022-06-14 MED ORDER — PHENOBARBITAL 64.8 MG PO TABS: 129.6000 mg | ORAL_TABLET | Freq: Every day | ORAL | 1 refills | Status: DC

## 2022-06-14 MED ORDER — PHENYTOIN SODIUM EXTENDED 100 MG PO CAPS
300.0000 mg | ORAL_CAPSULE | Freq: Every day | ORAL | 1 refills | Status: DC
Start: 2022-06-14 — End: 2022-08-27

## 2022-06-18 DIAGNOSIS — H401111 Primary open-angle glaucoma, right eye, mild stage: Secondary | ICD-10-CM | POA: Diagnosis not present

## 2022-06-18 DIAGNOSIS — H2513 Age-related nuclear cataract, bilateral: Secondary | ICD-10-CM | POA: Diagnosis not present

## 2022-06-18 DIAGNOSIS — H16143 Punctate keratitis, bilateral: Secondary | ICD-10-CM | POA: Diagnosis not present

## 2022-06-18 DIAGNOSIS — H02831 Dermatochalasis of right upper eyelid: Secondary | ICD-10-CM | POA: Diagnosis not present

## 2022-06-25 ENCOUNTER — Other Ambulatory Visit: Payer: Self-pay | Admitting: Internal Medicine

## 2022-06-25 ENCOUNTER — Other Ambulatory Visit: Payer: Self-pay | Admitting: Cardiovascular Disease

## 2022-07-01 ENCOUNTER — Other Ambulatory Visit: Payer: Self-pay | Admitting: Internal Medicine

## 2022-07-02 ENCOUNTER — Other Ambulatory Visit: Payer: Self-pay

## 2022-07-07 DIAGNOSIS — R339 Retention of urine, unspecified: Secondary | ICD-10-CM | POA: Diagnosis not present

## 2022-07-08 ENCOUNTER — Other Ambulatory Visit: Payer: Self-pay | Admitting: Internal Medicine

## 2022-07-08 ENCOUNTER — Other Ambulatory Visit: Payer: Medicare PPO

## 2022-07-08 DIAGNOSIS — E118 Type 2 diabetes mellitus with unspecified complications: Secondary | ICD-10-CM | POA: Diagnosis not present

## 2022-07-09 LAB — LIPID PANEL W/O CHOL/HDL RATIO
Cholesterol, Total: 141 mg/dL (ref 100–199)
HDL: 69 mg/dL (ref >39)
LDL Chol Calc (NIH): 61 mg/dL (ref 0–99)
Triglycerides: 46 mg/dL (ref 0–149)
VLDL Cholesterol Cal: 11 mg/dL (ref 5–40)

## 2022-07-09 LAB — HGB A1C W/O EAG: Hgb A1c MFr Bld: 5.4 % (ref 4.8–5.6)

## 2022-07-12 ENCOUNTER — Other Ambulatory Visit: Payer: Self-pay

## 2022-07-12 ENCOUNTER — Encounter: Payer: Self-pay | Admitting: Internal Medicine

## 2022-07-12 ENCOUNTER — Ambulatory Visit (INDEPENDENT_AMBULATORY_CARE_PROVIDER_SITE_OTHER): Payer: Medicare PPO | Admitting: Internal Medicine

## 2022-07-12 VITALS — BP 128/66 | HR 79 | Ht 64.0 in | Wt 198.0 lb

## 2022-07-12 DIAGNOSIS — N3000 Acute cystitis without hematuria: Secondary | ICD-10-CM | POA: Diagnosis not present

## 2022-07-12 DIAGNOSIS — I2510 Atherosclerotic heart disease of native coronary artery without angina pectoris: Secondary | ICD-10-CM | POA: Diagnosis not present

## 2022-07-12 DIAGNOSIS — I10 Essential (primary) hypertension: Secondary | ICD-10-CM | POA: Diagnosis not present

## 2022-07-12 DIAGNOSIS — E7800 Pure hypercholesterolemia, unspecified: Secondary | ICD-10-CM

## 2022-07-12 DIAGNOSIS — E119 Type 2 diabetes mellitus without complications: Secondary | ICD-10-CM | POA: Diagnosis not present

## 2022-07-12 DIAGNOSIS — G40909 Epilepsy, unspecified, not intractable, without status epilepticus: Secondary | ICD-10-CM

## 2022-07-12 LAB — POCT URINALYSIS DIPSTICK
Bilirubin, UA: NEGATIVE
Blood, UA: NEGATIVE
Glucose, UA: NEGATIVE
Ketones, UA: NEGATIVE
Nitrite, UA: POSITIVE
Protein, UA: NEGATIVE
Spec Grav, UA: 1.02 (ref 1.010–1.025)
Urobilinogen, UA: 0.2 E.U./dL
pH, UA: 6 (ref 5.0–8.0)

## 2022-07-12 MED ORDER — ROSUVASTATIN CALCIUM 40 MG PO TABS: 40.0000 mg | ORAL_TABLET | Freq: Every evening | ORAL | 0 refills | Status: DC

## 2022-07-12 MED ORDER — CIPROFLOXACIN HCL 500 MG PO TABS: 500.0000 mg | ORAL_TABLET | Freq: Two times a day (BID) | ORAL | 0 refills | Status: AC

## 2022-07-12 NOTE — Progress Notes (Signed)
Established Patient Office Visit  Subjective:  Patient ID: Edward Thompson, male    DOB: 12-Sep-1950  Age: 72 y.o. MRN: [id number]  Chief Complaint  Patient presents with   Follow-up    3 month lab results    No new complaints, here for lab review and medication refills. Labs reviewed and notable for well controlled lipids and normal A1c. No seizures despite not receiving Phenobarbital for two weeks due to pharmacy error. Daughter reports malodorous urine with mucus     Past Medical History:  Diagnosis Date   CAD (coronary artery disease)    Carotid stenosis, left 08/2017   Diabetes mellitus without complication (HCC)    Foot drop    Fracture of neck ([place]) 2008   fell off a roof. required halo x 4 months. also fractured alot of vertebrae   Heart attack ([place]) 2015   Heart disease    Hepatitis    6th grade    Hyperlipidemia    Hypertension    Myocardial infarction acute ([place]) 2015   Seizures ([place])    taking phenobarbitol and dilantin. LAST SEIZURE WAS 2016. well controlled on meds   Sleep apnea    USES CPAP   Syncope 2019   Vertigo    Viral meningitis     Social History   Socioeconomic History   Marital status: Widowed    Spouse name: Not on file   Number of children: Not on file   Years of education: Not on file   Highest education level: Not on file  Occupational History   Not on file  Tobacco Use   Smoking status: Never   Smokeless tobacco: Never  Vaping Use   Vaping Use: Never used  Substance and Sexual Activity   Alcohol use: No   Drug use: No   Sexual activity: Not Currently  Other Topics Concern   Not on file  Social History Narrative   Not on file   Social Determinants of Health   Financial Resource Strain: Not on file  Food Insecurity: Not on file  Transportation Needs: Not on file  Physical Activity: Not on file  Stress: Not on file  Social Connections: Not on file  Intimate Partner Violence: Not on file    Family History  Problem  Relation Age of Onset   Heart disease Mother    Heart attack Father     No Known Allergies  Review of Systems  Constitutional: Negative.   HENT: Negative.    Eyes: Negative.   Respiratory: Negative.    Cardiovascular: Negative.   Gastrointestinal: Negative.   Genitourinary: Negative.   Skin: Negative.   Neurological: Negative.   Endo/Heme/Allergies: Negative.        Objective:   BP 128/66   Pulse 79   Ht '5\' 4"'$  (1.626 m)   Wt 198 lb (89.8 kg)   SpO2 95%   BMI 33.99 kg/m   Vitals:   07/12/22 1327  BP: 128/66  Pulse: 79  Height: '5\' 4"'$  (1.626 m)  Weight: 198 lb (89.8 kg)  SpO2: 95%  BMI (Calculated): 33.97    Physical Exam Vitals reviewed.  Constitutional:      Appearance: Normal appearance. He is obese.     Comments: Seated in a wheelchair  HENT:     Head: Normocephalic.     Left Ear: There is no impacted cerumen.     Nose: Nose normal.     Mouth/Throat:     Mouth: Mucous membranes are moist.  Pharynx: No posterior oropharyngeal erythema.  Eyes:     Extraocular Movements: Extraocular movements intact.     Pupils: Pupils are equal, round, and reactive to light.  Cardiovascular:     Rate and Rhythm: Regular rhythm.     Chest Wall: PMI is not displaced.     Pulses: Normal pulses.     Heart sounds: Normal heart sounds. No murmur heard. Pulmonary:     Effort: Pulmonary effort is normal.     Breath sounds: Normal air entry. No rhonchi or rales.  Abdominal:     General: Abdomen is flat. Bowel sounds are normal. There is no distension.     Palpations: Abdomen is soft. There is no hepatomegaly, splenomegaly or mass.     Tenderness: There is no abdominal tenderness.  Musculoskeletal:        General: Normal range of motion.     Cervical back: Normal range of motion and neck supple.     Right lower leg: Edema present.     Left lower leg: Edema present.  Skin:    General: Skin is warm and dry.  Neurological:     General: No focal deficit present.      Mental Status: He is alert and oriented to person, place, and time.     Cranial Nerves: No cranial nerve deficit.     Motor: No weakness.  Psychiatric:        Mood and Affect: Mood normal.        Behavior: Behavior normal.      No results found for any visits on 07/12/22.  Recent Results (from the past 2160 hour(Jashanti Clinkscale))  Lipid Panel w/o Chol/HDL Ratio     Status: None   Collection Time: 07/08/22  9:30 AM  Result Value Ref Range   Cholesterol, Total 141 100 - 199 mg/dL   Triglycerides 46 0 - 149 mg/dL   HDL 69 >39 mg/dL   VLDL Cholesterol Cal 11 5 - 40 mg/dL   LDL Chol Calc (NIH) 61 0 - 99 mg/dL  Hgb A1c w/o eAG     Status: None   Collection Time: 07/08/22  9:30 AM  Result Value Ref Range   Hgb A1c MFr Bld 5.4 4.8 - 5.6 %    Comment:          Prediabetes: 5.7 - 6.4          Diabetes: >6.4          Glycemic control for adults with diabetes: <7.0       Assessment & Plan:   Problem List Items Addressed This Visit   None There are no diagnoses linked to this encounter.  1. Essential hypertension  2. Coronary artery disease involving native coronary artery of native heart without angina pectoris - Lipid panel; Future - Comprehensive metabolic panel; Future  3. Seizure disorder (HCC) - Phenobarbital level; Future  4. Diabetes mellitus without complication ([place]) - Hemoglobin A1c; Future - POCT UA - Glucose/Protein; Future  5. Pure hypercholesterolemia - rosuvastatin (CRESTOR) 40 MG tablet; Take 1 tablet (40 mg total) by mouth at bedtime.  Dispense: 90 tablet; Refill: 0   No follow-ups on file.   Total time spent: 30 minutes  Volanda Napoleon, MD  07/12/2022

## 2022-07-13 MED ORDER — CLOPIDOGREL BISULFATE 75 MG PO TABS: ORAL_TABLET | ORAL | 0 refills | Status: DC

## 2022-07-14 ENCOUNTER — Encounter: Payer: Self-pay | Admitting: Cardiology

## 2022-07-14 ENCOUNTER — Ambulatory Visit: Payer: Medicare PPO | Admitting: Cardiology

## 2022-07-14 VITALS — BP 150/80 | HR 67 | Ht 64.0 in | Wt 198.0 lb

## 2022-07-14 DIAGNOSIS — Z955 Presence of coronary angioplasty implant and graft: Secondary | ICD-10-CM | POA: Diagnosis not present

## 2022-07-14 DIAGNOSIS — E782 Mixed hyperlipidemia: Secondary | ICD-10-CM

## 2022-07-14 DIAGNOSIS — G4733 Obstructive sleep apnea (adult) (pediatric): Secondary | ICD-10-CM

## 2022-07-14 DIAGNOSIS — I10 Essential (primary) hypertension: Secondary | ICD-10-CM | POA: Diagnosis not present

## 2022-07-14 DIAGNOSIS — I2510 Atherosclerotic heart disease of native coronary artery without angina pectoris: Secondary | ICD-10-CM

## 2022-07-14 DIAGNOSIS — I6522 Occlusion and stenosis of left carotid artery: Secondary | ICD-10-CM | POA: Diagnosis not present

## 2022-07-14 DIAGNOSIS — R0602 Shortness of breath: Secondary | ICD-10-CM

## 2022-07-14 DIAGNOSIS — I252 Old myocardial infarction: Secondary | ICD-10-CM

## 2022-07-14 NOTE — Progress Notes (Signed)
Cardiology Office Note   Date:  07/15/2022   ID:  Edward Thompson Edward Thompson, MRN [id number]  PCP:  Edward Monday, MD  Cardiologist:  Marisue Ivan, NP      History of Present Illness: Edward Thompson is a 72 y.o. male who presents for  Chief Complaint  Patient presents with   Follow-up    4 month follow up    Patient in office for routine cardiac exam. Denies chest pain, shortness of breath, dizziness, lower extremity edema. Patient reports not taking medications this morning.      Past Medical History:  Diagnosis Date   CAD (coronary artery disease)    Carotid stenosis, left 08/2017   Diabetes mellitus without complication (HCC)    Foot drop    Fracture of neck (HCC) 2008   fell off a roof. required halo x 4 months. also fractured alot of vertebrae   Heart attack (HCC) 2015   Heart disease    Hepatitis    6th grade    Hyperlipidemia    Hypertension    Myocardial infarction acute (HCC) 2015   Seizures (HCC)    taking phenobarbitol and dilantin. LAST SEIZURE WAS 2016. well controlled on meds   Sleep apnea    USES CPAP   Syncope 2019   Vertigo    Viral meningitis      Past Surgical History:  Procedure Laterality Date   CARDIAC SURGERY     COLONOSCOPY     COLONOSCOPY WITH PROPOFOL N/A 03/11/2021   Procedure: COLONOSCOPY WITH PROPOFOL;  Surgeon: Toledo, Boykin Nearing, MD;  Location: [facility];  Service: Gastroenterology;  Laterality: N/A;   CORONARY ANGIOPLASTY     PATIENT UNAWARE OF THIS   CORONARY ARTERY BYPASS GRAFT  2015   ENDARTERECTOMY Left 08/19/2017   Procedure: ENDARTERECTOMY CAROTID;  Surgeon: Renford Dills, MD;  Location: [facility];  Service: Vascular;  Laterality: Left;   ESOPHAGOGASTRODUODENOSCOPY N/A 03/11/2021   Procedure: ESOPHAGOGASTRODUODENOSCOPY (EGD);  Surgeon: Toledo, Boykin Nearing, MD;  Location: [facility];  Service: Gastroenterology;  Laterality: N/A;  DM   HERNIA REPAIR Right 1985   inguinal     Current Outpatient  Medications  Medication Sig Dispense Refill   ACCU-CHEK GUIDE test strip      Accu-Chek Softclix Lancets lancets      acetaminophen (TYLENOL) 500 MG tablet Take 500 mg by mouth every 6 (six) hours as needed.     amLODipine (NORVASC) 5 MG tablet Take 0.5 tablets (2.5 mg total) by mouth daily.     aspirin EC 81 MG tablet Take 81 mg by mouth at bedtime.     Blood Glucose Monitoring Suppl (ACCU-CHEK GUIDE) w/Device KIT      ciprofloxacin (CIPRO) 500 MG tablet Take 1 tablet (500 mg total) by mouth 2 (two) times daily for 7 days. 14 tablet 0   clopidogrel (PLAVIX) 75 MG tablet Take one tablet by mouth once daily 90 tablet 0   escitalopram (LEXAPRO) 10 MG tablet Take 10 mg by mouth daily.     feeding supplement, GLUCERNA SHAKE, (GLUCERNA SHAKE) LIQD Take 237 mLs by mouth 2 (two) times daily between meals. 1000 mL 12   isosorbide mononitrate (IMDUR) 30 MG 24 hr tablet TAKE 1 TABLET EVERY DAY 90 tablet 3   latanoprost (XALATAN) 0.005 % ophthalmic solution 1 drop at bedtime.     meclizine (ANTIVERT) 25 MG tablet TAKE 1 TABLET BY MOUTH 4 TIMES DAILY 360 tablet 1   metFORMIN (GLUCOPHAGE)  500 MG tablet TAKE 1 TABLET EVERY MORNING 90 tablet 1   metFORMIN (GLUCOPHAGE) 850 MG tablet Take by mouth.     metoprolol succinate (TOPROL-XL) 25 MG 24 hr tablet Take 0.5 tablets (12.5 mg total) by mouth daily. 30 tablet 0   Multiple Vitamin (MULTIVITAMIN) tablet Take 1 tablet by mouth daily.     nitroGLYCERIN (NITROSTAT) 0.4 MG SL tablet Place 0.4 mg under the tongue every 5 (five) minutes as needed for chest pain.     pantoprazole (PROTONIX) 40 MG tablet Take 40 mg by mouth daily.     PHENobarbital (LUMINAL) 64.8 MG tablet TAKE 2 TABLETS AT BEDTIME 180 tablet 1   phenytoin (DILANTIN) 100 MG ER capsule Take 3 capsules (300 mg total) by mouth at bedtime. 90 capsule 1   Pollen Extracts (PROSTAT PO) Take 30 mLs by mouth daily.     polyethylene glycol (MIRALAX / GLYCOLAX) 17 g packet Take 17 g by mouth daily. 14 each 0    rosuvastatin (CRESTOR) 40 MG tablet Take 1 tablet (40 mg total) by mouth at bedtime. 90 tablet 0   senna-docusate (SENOKOT-S) 8.6-50 MG tablet Take 1 tablet by mouth at bedtime.     No current facility-administered medications for this visit.    Allergies:   Patient has no known allergies.    Social History:   reports that he has never smoked. He has never used smokeless tobacco. He reports that he does not drink alcohol and does not use drugs.   Family History:  family history includes Heart attack in his father; Heart disease in his mother.    ROS:     Review of Systems  Constitutional: Negative.   HENT: Negative.    Respiratory: Negative.    Cardiovascular: Negative.   Gastrointestinal: Negative.   Skin: Negative.   Neurological:  Negative for dizziness.  All other systems reviewed and are negative.   All other systems are reviewed and negative.   PHYSICAL EXAM: VS:  BP (!) 150/80   Pulse 67   Ht 5\' 4"  (1.626 m)   Wt 89.8 kg   SpO2 96%   BMI 33.99 kg/m  , BMI Body mass index is 33.99 kg/m. Last weight:  Wt Readings from Last 3 Encounters:  07/14/22 89.8 kg  07/12/22 89.8 kg  02/23/22 78.5 kg   Physical Exam Constitutional:      Appearance: Normal appearance. He is normal weight.  HENT:     Head: Normocephalic and atraumatic.  Eyes:     Extraocular Movements: Extraocular movements intact.     Conjunctiva/sclera: Conjunctivae normal.     Pupils: Pupils are equal, round, and reactive to light.  Cardiovascular:     Rate and Rhythm: Normal rate and regular rhythm.  Pulmonary:     Effort: Pulmonary effort is normal.     Breath sounds: Normal breath sounds.  Abdominal:     General: Abdomen is flat. Bowel sounds are normal.     Palpations: Abdomen is soft.  Musculoskeletal:        General: Normal range of motion.     Cervical back: Normal range of motion.  Neurological:     General: No focal deficit present.     Mental Status: He is alert and oriented to  person, place, and time. Mental status is at baseline.  Psychiatric:        Mood and Affect: Mood normal.        Behavior: Behavior normal.  Thought Content: Thought content normal.        Judgment: Judgment normal.     EKG: none today  Recent Labs: 09/23/2021: Magnesium 2.1 01/15/2022: ALT 25; BUN 18; Creatinine, Ser 0.56; Hemoglobin 12.5; Platelets 180; Potassium 4.0; Sodium 132    Lipid Panel    Component Value Date/Time   CHOL 141 07/08/2022 0930   CHOL 117 09/23/2013 0132   TRIG 46 07/08/2022 0930   TRIG 64 09/23/2013 0132   HDL 69 07/08/2022 0930   HDL 49 09/23/2013 0132   CHOLHDL 3.5 02/06/2016 0419   VLDL 28 02/06/2016 0419   VLDL 13 09/23/2013 0132   LDLCALC 61 07/08/2022 0930   LDLCALC 55 09/23/2013 0132      Other studies Reviewed: Patient: [id number] Edward Thompson DOB:  [birth year]  Date:  07/14/2020 14:15 Provider: Adrian Blackwater MD Encounter: ECHO   Page 2 REASON FOR VISIT  Visit for: Echocardiogram/Shortness of breath  Sex:   Male          wt=  180  lbs.  BP= 128/74  Height= 64   inches.    TESTS  Imaging: Echocardiogram:  An echocardiogram in (2-d) mode was performed and in Doppler mode with color flow velocity mapping was performed. The aortic valve cusps are abnormal 2.0  cm, flow velocity 0.642    m/s, and systolic calculated mean flow gradient 1  mmHg. Mitral valve diastolic peak flow velocity E 1.308    m/s and E/A ratio 0.9. Aortic root diameter 4.0   cm. The LVOT internal diameter 3.6   cm and flow velocity was abnormal 0.953   m/s. LVEF 73  %. LA dimension 4.2 cm. Aortic Valve has Mild Regurgitation. Mitral Valve has Mild Regurgitation.     ASSESSMENT  Technically difficult study due to body habitus.  Normal left ventricular systolic function.  Moderate left ventricular hypertrophy with GRADE 1 (relaxation abnormality) diastolic dysfunction.  Normal right ventricular systolic function.  Normal right ventricular diastolic  function.  Normal left ventricular wall motion.  Normal right ventricular wall motion.  Normal pulmonary artery pressure.  Mild mitral regurgitation.  Mild aortic regurgitation.  No pericardial effusion.  Mildly dilated Left atrium  Normal Left ventricle  Normal Right atrium and ventricle  Normal Ao root and ascending Ao     THERAPY   Referring physician: Laurier Nancy  Sonographer: Adrian Blackwater.   Adrian Blackwater MD  Electronically signed by: Adrian Blackwater     Date: 07/15/2020 12:52  Patient: [id number] - Edward Thompson DOB:  [birth year]  Date:  11/28/2019 10:30 Provider: Adrian Blackwater MD Encounter: ALL ANGIOGRAMS (CTA BRAIN, CAROTIDS, RENAL ARTERIES, PE)   Page 2 REASON FOR VISIT  Referred by Dr.Shaukat Welton Flakes.    TESTS  Imaging: Computed Tomographic Angiography:  Cardiac multidetector CT was performed paying particular attention to the coronary arteries for the diagnosis of: CAD. Diagnostic Drugs:  Administered iohexol (Omnipaque) through an antecubital vein and images from the examination were analyzed for the presence and extent of coronary artery disease, using 3D image processing software. 100 mL of non-ionic contrast (Omnipaque) was used.   TEST CONCLUSIONS  Quality of study: Excellent.  1-Calcium score: 1685.5  2-Right dominant system.  3-Severely calcified LAD, LCX, and RCA. Stent in mid LAD with no significant restenosis. There is a high grade lesion in 1st diagonall but small vessel. RCA had mild disease proximally. Treat medically.   Adrian Blackwater MD  Electronically signed by: Adrian Blackwater  Date: 12/03/2019 11:34  Patient: [id number] - Edward Thompson DOB:  [birth year]  Date:  11/21/2019 07:45 Provider: Adrian Blackwater MD Encounter: [facility] TEST   Page 2 TESTS    [facility] [address] [place], [place] [address] [phone] STUDY:  Gated Stress / Rest Myocardial Perfusion Imaging Tomographic (SPECT) Including attenuation  correction Wall Motion, Left Ventricular Ejection Fraction By Gated Technique.Persantine Stress Test. SEX:  Male      WEIGHT: 177 lbs   HEIGHT: 64 in                    ARMS UP: YES/NO                                                                                                                                                                                REFERRING PHYSICIAN: Dr.Shaukat Khan                                                                                                                                                                                                                       INDICATION FOR STUDY:  CAD  TECHNIQUE:  Approximately 20 minutes following the intravenous administration of 11.3 mCi of Tc-37m Sestamibi after stress testing in a reclined supine position with arms above their head if able to do so, gated SPECT imaging of the heart was performed. After about a 2hr break, the patient was injected intravenously with 33.2 mCi of Tc-23m Sestamibi.  Approximately 45 minutes later in the same position as stress imaging SPECT rest imaging of the heart was performed.  STRESS BY:  Adrian Blackwater, MD PROTOCOL:  Persantine   DOSE ADMIN: 9.0 cc       ROUTE OF ADMINISTRATION: IV                                                                            MAX PRED HR: 151                     85%: 128               75%: 113                                                                                                                   RESTING BP: 100/58    RESTING HR: 54   PEAK BP: 92/50     PEAK HR: 60                                                                    EXERCISE DURATION:    4 min injection                                            REASON FOR TEST TERMINATION:     Protocol end  SYMPTOMS:   None                                                                                                                                                                                                          EKG RESULTS:  Sinus bradycardia. T-wave inversion in III and V3. No changes with persantine.                                                             IMAGE QUALITY:  Good  PERFUSION/WALL MOTION FINDINGS: EF = 69%. Small mild reversible mid inferolateral, apical septal and apical lateral wall defects, normal wall motion.                                                                           IMPRESSION: Possible ischemia in the RCA territory with normal LVEF, advise CCTA.                                                                                                                                                                                                                                                                                       Adrian Blackwater, MD Stress Interpreting Physician / Nuclear Interpreting Physician        ASSESSMENT   Coronary artery disease  Hypertension  Nonorganic sleep apnea  Hyperlipidemia   Adrian Blackwater MD  Electronically signed by: Adrian Blackwater     Date: 11/26/2019 11:56  ASSESSMENT AND PLAN:    ICD-10-CM   1. Stenosis of left carotid artery  I65.22     2. Essential hypertension  I10     3. Coronary artery disease involving native coronary artery of native heart without angina pectoris   I25.10     4. OSA on CPAP  G47.33     5. Mixed hyperlipidemia  E78.2     6. CAD s/p coronary artery stent placement  Z95.5     7. Shortness of breath  R06.02 PCV ECHOCARDIOGRAM COMPLETE    8. Athscl heart disease of native coronary artery w/o ang pctrs  I25.10     9. History of non-ST elevation myocardial infarction (NSTEMI)  I25.2        Problem List Items Addressed This Visit       Cardiovascular and Mediastinum   Carotid stenosis - Primary   Essential hypertension    Repeat echo.  CAD (coronary artery disease)   Athscl heart disease of native coronary artery w/o ang pctrs    Patient doing well. No acute complaints.         Respiratory   OSA on CPAP     Other   Hyperlipidemia   History of non-ST elevation myocardial infarction (NSTEMI)   CAD s/p coronary artery stent placement   Other Visit Diagnoses     Shortness of breath       Relevant Orders   PCV ECHOCARDIOGRAM COMPLETE        Disposition:   Return in about 4 weeks (around 08/11/2022) for after echo.    Total time spent: 30 minutes  Signed,  Marisue Ivan, NP  07/15/2022 12:32 PM    [facility]

## 2022-07-15 NOTE — Assessment & Plan Note (Signed)
Patient doing well. No acute complaints.

## 2022-07-15 NOTE — Assessment & Plan Note (Signed)
Repeat echo

## 2022-07-28 ENCOUNTER — Other Ambulatory Visit: Payer: Medicare PPO

## 2022-08-03 ENCOUNTER — Ambulatory Visit (INDEPENDENT_AMBULATORY_CARE_PROVIDER_SITE_OTHER): Payer: Self-pay

## 2022-08-03 DIAGNOSIS — I340 Nonrheumatic mitral (valve) insufficiency: Secondary | ICD-10-CM | POA: Diagnosis not present

## 2022-08-03 DIAGNOSIS — I361 Nonrheumatic tricuspid (valve) insufficiency: Secondary | ICD-10-CM

## 2022-08-03 DIAGNOSIS — I351 Nonrheumatic aortic (valve) insufficiency: Secondary | ICD-10-CM

## 2022-08-03 DIAGNOSIS — R0602 Shortness of breath: Secondary | ICD-10-CM

## 2022-08-12 ENCOUNTER — Encounter: Payer: Self-pay | Admitting: Cardiovascular Disease

## 2022-08-12 ENCOUNTER — Ambulatory Visit (INDEPENDENT_AMBULATORY_CARE_PROVIDER_SITE_OTHER): Payer: Medicare PPO | Admitting: Cardiovascular Disease

## 2022-08-12 VITALS — BP 116/72 | HR 75 | Ht 64.0 in | Wt 194.0 lb

## 2022-08-12 DIAGNOSIS — E782 Mixed hyperlipidemia: Secondary | ICD-10-CM

## 2022-08-12 DIAGNOSIS — I10 Essential (primary) hypertension: Secondary | ICD-10-CM | POA: Diagnosis not present

## 2022-08-12 DIAGNOSIS — I2510 Atherosclerotic heart disease of native coronary artery without angina pectoris: Secondary | ICD-10-CM | POA: Diagnosis not present

## 2022-08-12 MED ORDER — ISOSORBIDE MONONITRATE ER 30 MG PO TB24: 30.0000 mg | ORAL_TABLET | Freq: Every day | ORAL | 3 refills | Status: DC

## 2022-08-12 MED ORDER — METOPROLOL SUCCINATE ER 25 MG PO TB24: 12.5000 mg | ORAL_TABLET | Freq: Every day | ORAL | 1 refills | Status: DC

## 2022-08-12 NOTE — Progress Notes (Signed)
Cardiology Office Note   Date:  08/12/2022   ID:  Edward Thompson, Edward Thompson 1951/02/22, MRN [id number]  PCP:  Jodi Marble, MD  Cardiologist:  Neoma Laming, MD      History of Present Illness: Edward Thompson is a 72 y.o. male who presents for  Chief Complaint  Patient presents with   Follow-up    4 week follow up, Echo results.    Patient in office for 4 week follow up, discuss echo results. Denies chest pain, shortness of breath.    Past Medical History:  Diagnosis Date   CAD (coronary artery disease)    Carotid stenosis, left 08/2017   Diabetes mellitus without complication    Foot drop    Fracture of neck 2008   fell off a roof. required halo x 4 months. also fractured alot of vertebrae   Heart attack 2015   Heart disease    Hepatitis    6th grade    Hyperlipidemia    Hypertension    Myocardial infarction acute 2015   Seizures    taking phenobarbitol and dilantin. LAST SEIZURE WAS 2016. well controlled on meds   Sleep apnea    USES CPAP   Syncope 2019   Vertigo    Viral meningitis      Past Surgical History:  Procedure Laterality Date   CARDIAC SURGERY     COLONOSCOPY     COLONOSCOPY WITH PROPOFOL N/A 03/11/2021   Procedure: COLONOSCOPY WITH PROPOFOL;  Surgeon: Toledo, Benay Pike, MD;  Location: [facility];  Service: Gastroenterology;  Laterality: N/A;   CORONARY ANGIOPLASTY     PATIENT UNAWARE OF THIS   CORONARY ARTERY BYPASS GRAFT  2015   ENDARTERECTOMY Left 08/19/2017   Procedure: ENDARTERECTOMY CAROTID;  Surgeon: Katha Cabal, MD;  Location: [facility];  Service: Vascular;  Laterality: Left;   ESOPHAGOGASTRODUODENOSCOPY N/A 03/11/2021   Procedure: ESOPHAGOGASTRODUODENOSCOPY (EGD);  Surgeon: Toledo, Benay Pike, MD;  Location: [facility];  Service: Gastroenterology;  Laterality: N/A;  DM   HERNIA REPAIR Right 1985   inguinal     Current Outpatient Medications  Medication Sig Dispense Refill   ACCU-CHEK GUIDE test strip       Accu-Chek Softclix Lancets lancets      acetaminophen (TYLENOL) 500 MG tablet Take 500 mg by mouth every 6 (six) hours as needed.     amLODipine (NORVASC) 5 MG tablet Take 0.5 tablets (2.5 mg total) by mouth daily.     aspirin EC 81 MG tablet Take 81 mg by mouth at bedtime.     Blood Glucose Monitoring Suppl (ACCU-CHEK GUIDE) w/Device KIT      clopidogrel (PLAVIX) 75 MG tablet Take one tablet by mouth once daily 90 tablet 0   escitalopram (LEXAPRO) 10 MG tablet Take 10 mg by mouth daily.     feeding supplement, GLUCERNA SHAKE, (GLUCERNA SHAKE) LIQD Take 237 mLs by mouth 2 (two) times daily between meals. 1000 mL 12   latanoprost (XALATAN) 0.005 % ophthalmic solution 1 drop at bedtime.     meclizine (ANTIVERT) 25 MG tablet TAKE 1 TABLET BY MOUTH 4 TIMES DAILY 360 tablet 1   metFORMIN (GLUCOPHAGE) 500 MG tablet TAKE 1 TABLET EVERY MORNING 90 tablet 1   metFORMIN (GLUCOPHAGE) 850 MG tablet Take by mouth.     Multiple Vitamin (MULTIVITAMIN) tablet Take 1 tablet by mouth daily.     nitroGLYCERIN (NITROSTAT) 0.4 MG SL tablet Place 0.4 mg under the tongue every 5 (five) minutes  as needed for chest pain.     pantoprazole (PROTONIX) 40 MG tablet Take 40 mg by mouth daily.     PHENobarbital (LUMINAL) 64.8 MG tablet TAKE 2 TABLETS AT BEDTIME 180 tablet 1   phenytoin (DILANTIN) 100 MG ER capsule Take 3 capsules (300 mg total) by mouth at bedtime. 90 capsule 1   Pollen Extracts (PROSTAT PO) Take 30 mLs by mouth daily.     polyethylene glycol (MIRALAX / GLYCOLAX) 17 g packet Take 17 g by mouth daily. 14 each 0   rosuvastatin (CRESTOR) 40 MG tablet Take 1 tablet (40 mg total) by mouth at bedtime. 90 tablet 0   senna-docusate (SENOKOT-S) 8.6-50 MG tablet Take 1 tablet by mouth at bedtime.     isosorbide mononitrate (IMDUR) 30 MG 24 hr tablet Take 1 tablet (30 mg total) by mouth daily. 90 tablet 3   metoprolol succinate (TOPROL-XL) 25 MG 24 hr tablet Take 0.5 tablets (12.5 mg total) by mouth daily. 45 tablet 1    No current facility-administered medications for this visit.    Allergies:   Patient has no known allergies.    Social History:   reports that he has never smoked. He has never used smokeless tobacco. He reports that he does not drink alcohol and does not use drugs.   Family History:  family history includes Heart attack in his father; Heart disease in his mother.    ROS:     Review of Systems  Constitutional: Negative.   HENT: Negative.    Eyes: Negative.   Respiratory: Negative.    Cardiovascular: Negative.   Gastrointestinal: Negative.   Genitourinary: Negative.   Musculoskeletal: Negative.   Skin: Negative.   Neurological: Negative.   Endo/Heme/Allergies: Negative.   Psychiatric/Behavioral: Negative.    All other systems reviewed and are negative.   All other systems are reviewed and negative.   PHYSICAL EXAM: VS:  BP 116/72   Pulse 75   Ht 5\' 4"  (1.626 m)   Wt 194 lb (88 kg)   SpO2 94%   BMI 33.30 kg/m  , BMI Body mass index is 33.3 kg/m. Last weight:  Wt Readings from Last 3 Encounters:  08/12/22 194 lb (88 kg)  07/14/22 198 lb (89.8 kg)  07/12/22 198 lb (89.8 kg)   Physical Exam Vitals reviewed.  Constitutional:      Appearance: Normal appearance. He is normal weight.  HENT:     Head: Normocephalic.     Nose: Nose normal.     Mouth/Throat:     Mouth: Mucous membranes are moist.  Eyes:     Pupils: Pupils are equal, round, and reactive to light.  Cardiovascular:     Rate and Rhythm: Normal rate and regular rhythm.     Pulses: Normal pulses.     Heart sounds: Normal heart sounds.  Pulmonary:     Effort: Pulmonary effort is normal.  Abdominal:     General: Abdomen is flat. Bowel sounds are normal.  Musculoskeletal:        General: Normal range of motion.     Cervical back: Normal range of motion.  Skin:    General: Skin is warm.  Neurological:     General: No focal deficit present.     Mental Status: He is alert.  Psychiatric:        Mood  and Affect: Mood normal.     EKG: none today  Recent Labs: 09/23/2021: Magnesium 2.1 01/15/2022: ALT 25; BUN 18; Creatinine, Ser 0.56;  Hemoglobin 12.5; Platelets 180; Potassium 4.0; Sodium 132    Lipid Panel    Component Value Date/Time   CHOL 141 07/08/2022 0930   CHOL 117 09/23/2013 0132   TRIG 46 07/08/2022 0930   TRIG 64 09/23/2013 0132   HDL 69 07/08/2022 0930   HDL 49 09/23/2013 0132   CHOLHDL 3.5 02/06/2016 0419   VLDL 28 02/06/2016 0419   VLDL 13 09/23/2013 0132   LDLCALC 61 07/08/2022 0930   LDLCALC 55 09/23/2013 0132    Other studies Reviewed: echo 08/03/22   ASSESSMENT AND PLAN:    ICD-10-CM   1. Athscl heart disease of native coronary artery w/o ang pctrs  I25.10     2. Essential hypertension  I10     3. Mixed hyperlipidemia  E78.2        Problem List Items Addressed This Visit       Cardiovascular and Mediastinum   Essential hypertension    Doing well. Normal EF on recent echo.       Relevant Medications   isosorbide mononitrate (IMDUR) 30 MG 24 hr tablet   metoprolol succinate (TOPROL-XL) 25 MG 24 hr tablet   Athscl heart disease of native coronary artery w/o ang pctrs - Primary   Relevant Medications   isosorbide mononitrate (IMDUR) 30 MG 24 hr tablet   metoprolol succinate (TOPROL-XL) 25 MG 24 hr tablet     Other   Hyperlipidemia   Relevant Medications   isosorbide mononitrate (IMDUR) 30 MG 24 hr tablet   metoprolol succinate (TOPROL-XL) 25 MG 24 hr tablet     Disposition:   Return in about 4 months (around 12/12/2022).    Total time spent: 30 minutes  Signed,  Neoma Laming, MD  08/12/2022 3:05 [place]

## 2022-08-12 NOTE — Assessment & Plan Note (Signed)
Doing well. Normal EF on recent echo.

## 2022-08-20 IMAGING — CR DG ABDOMEN ACUTE W/ 1V CHEST
4 series · 4 of 4 positions shown · non-contrast
Comparison: Chest x-ray 12/08/2018.  CT 10/05/2004.

CLINICAL DATA: Abdominal pain.  Nausea vomiting.

EXAM:
DG ABDOMEN ACUTE WITH 1 VIEW CHEST

[chest ap]
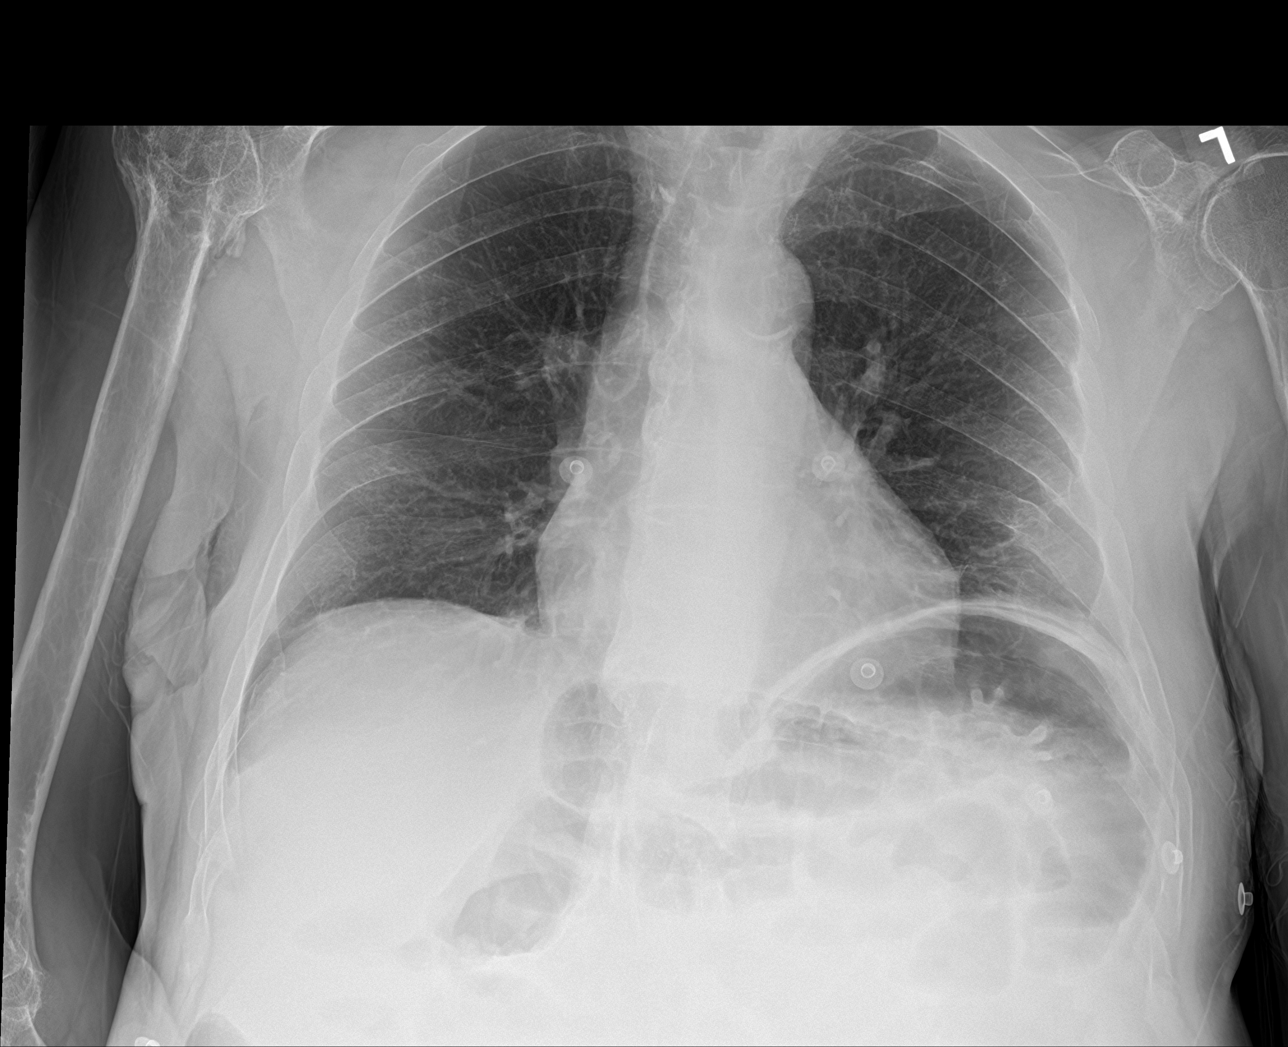

[abdomen supine (1 of 2)]
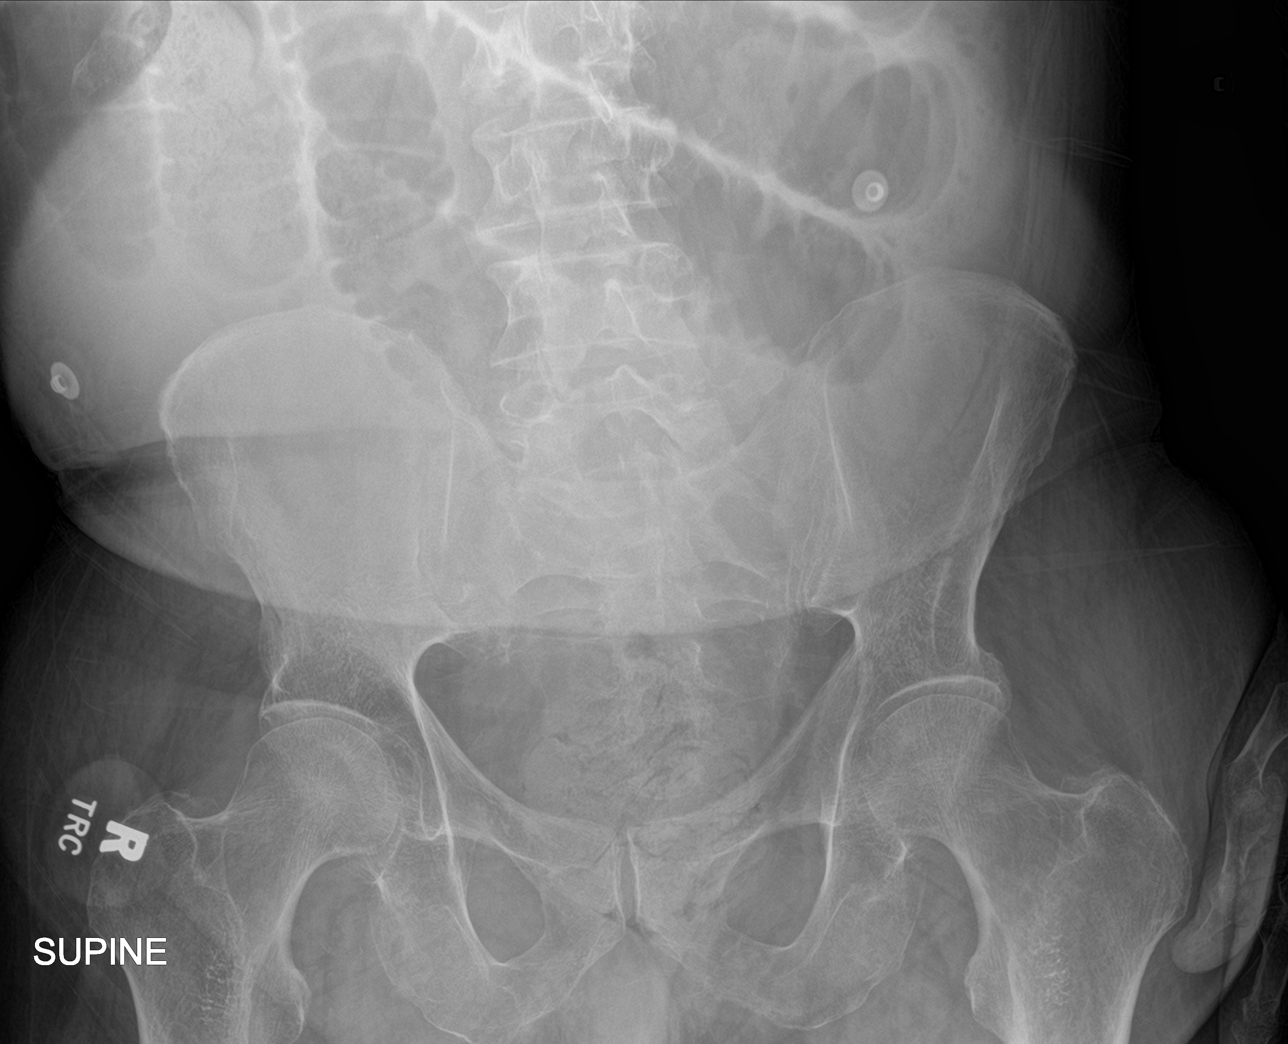

[abdomen supine (2 of 2)]
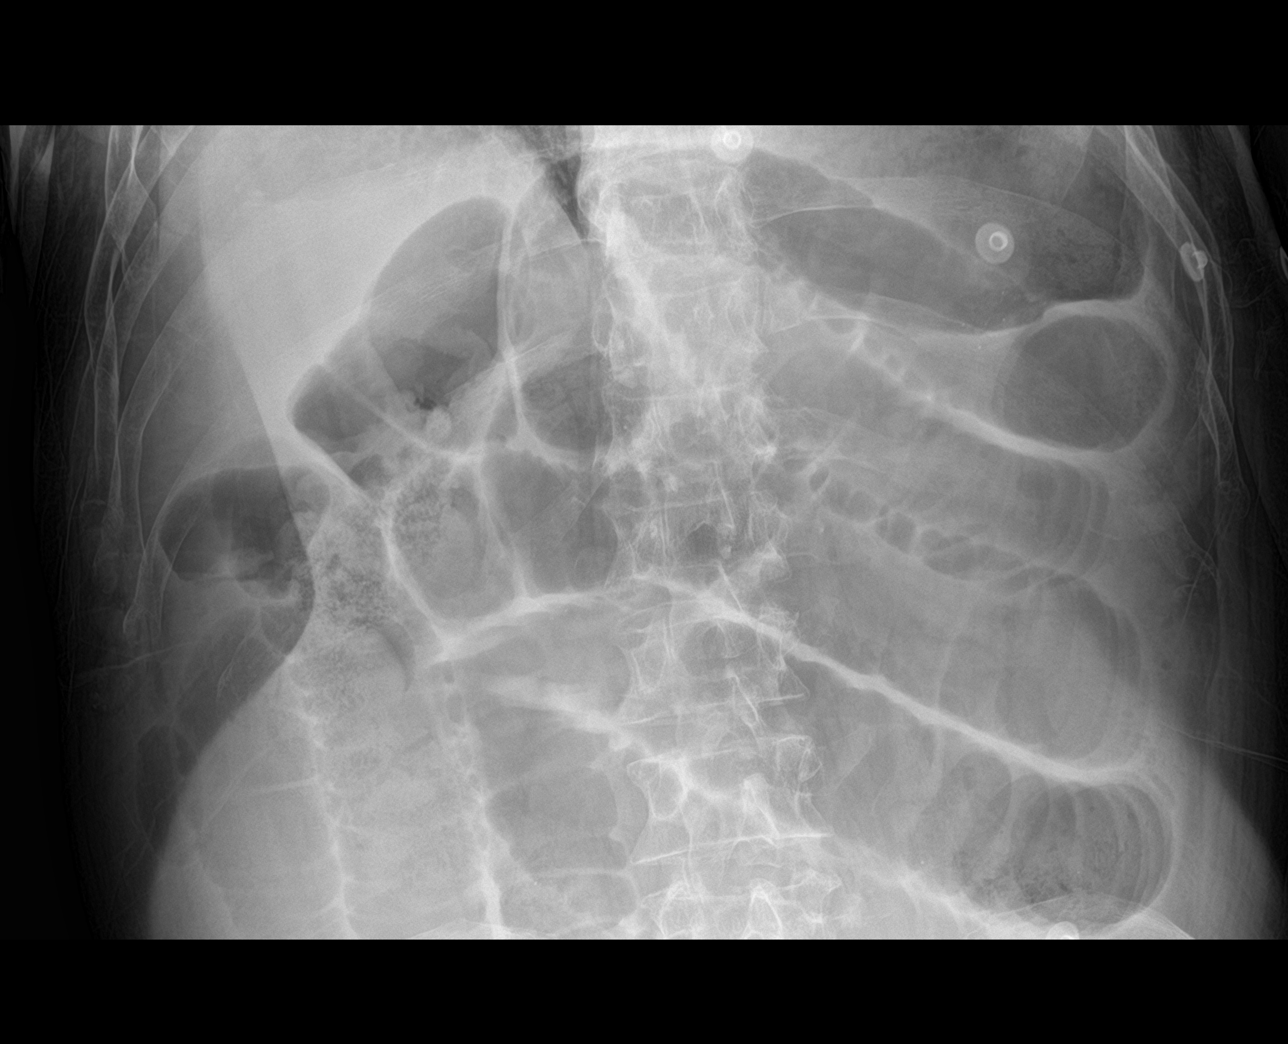

[abdomen erect]
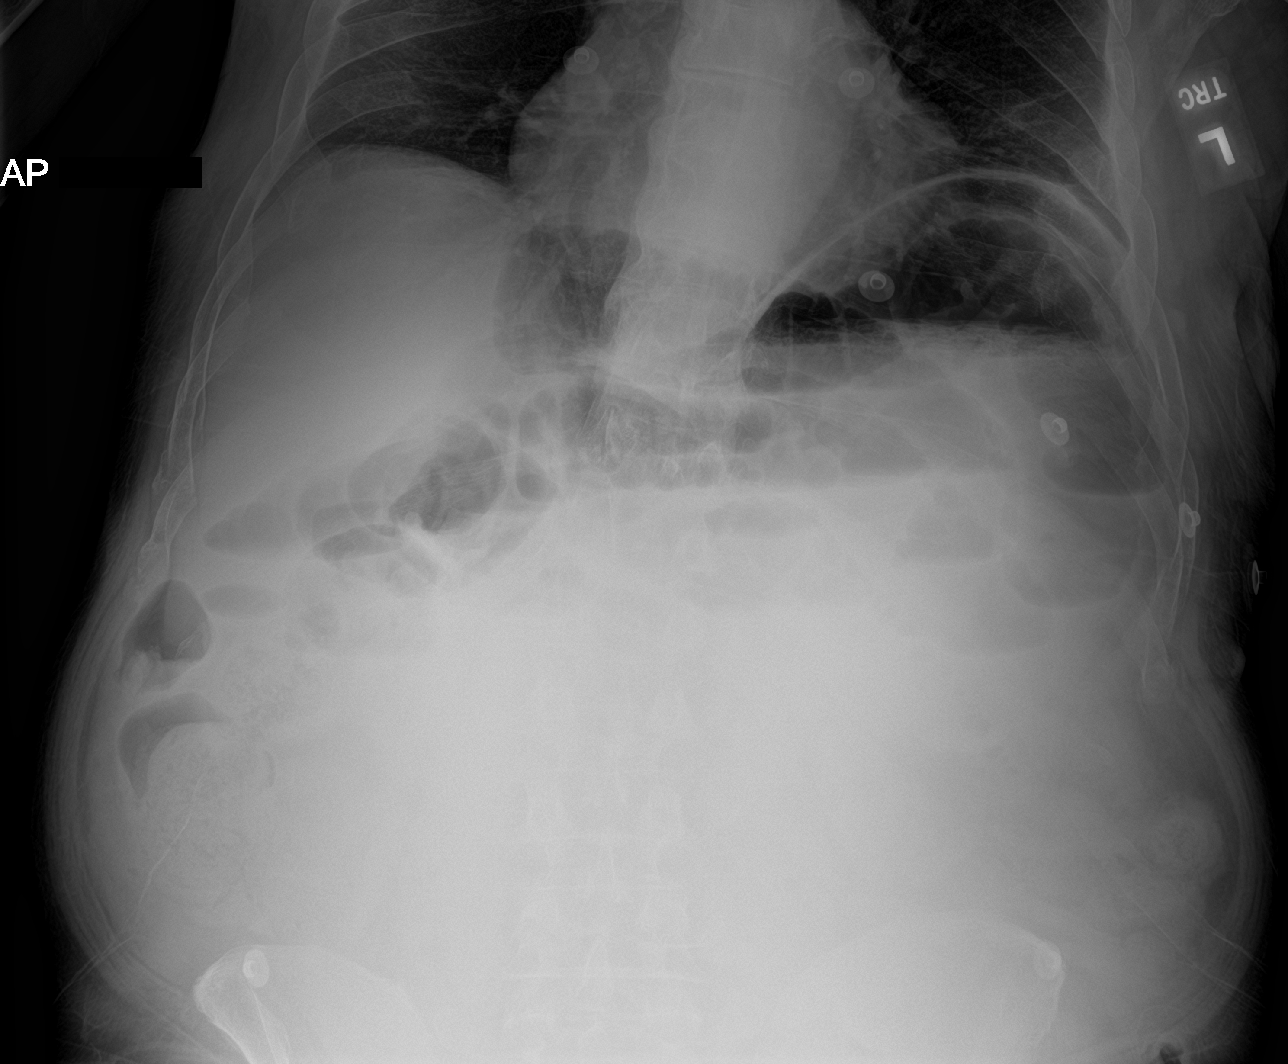

[4 of 4 positions shown; findings below may reference images not displayed]

FINDINGS: Mediastinum hilar structures normal. Mild bibasilar subsegmental
atelectasis and or scarring. No pleural effusion or pneumothorax.
Old left rib fractures again noted. Old proximal right humeral
fracture noted.

Prominently dilated loops of small bowel are noted. Small bowel
dilatation up to 5.1 cm noted. Paucity of intra colonic air noted.
Stool noted throughout the colon. Findings consistent with small
bowel obstruction. Gastric distention noted. No definite free air.
IMPRESSION: Findings consistent with small-bowel obstruction. CT of the abdomen
and pelvis should be considered for further evaluation.

## 2022-08-20 IMAGING — CT CT ABD-PELV W/O CM
2 of 4 series · 14 of 46 positions shown, 16 images · non-contrast
Comparison: CT Abdomen and Pelvis 10/05/2004.

CLINICAL DATA: 69-year-old male with abdominal distension, nausea
vomiting.

EXAM:
CT ABDOMEN AND PELVIS WITHOUT CONTRAST
TECHNIQUE: Multidetector CT imaging of the abdomen and pelvis was performed
following the standard protocol without IV contrast.

[Series 2: routine abd/pel wo · axial · 0.76mm/px · z∈[-1146,-700]mm · 11 of 99 slices shown, 13 images]
[im 5/99  soft-tissue]
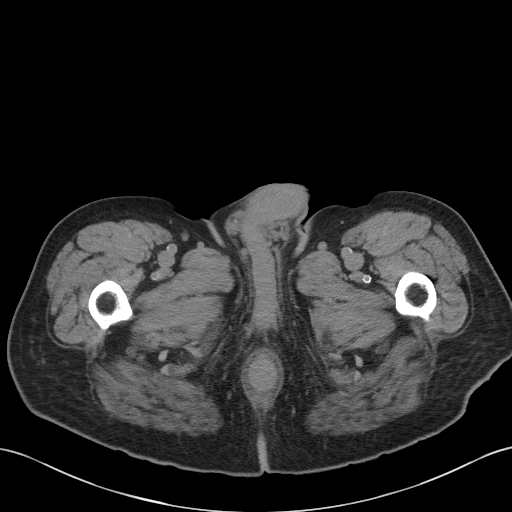
[im 5/99  bone]
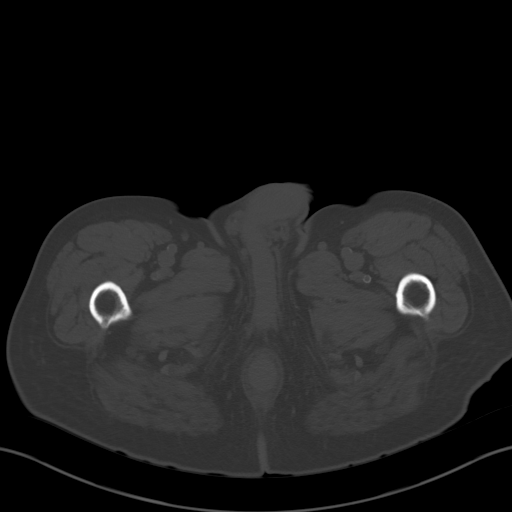
[im 14/99  soft-tissue]
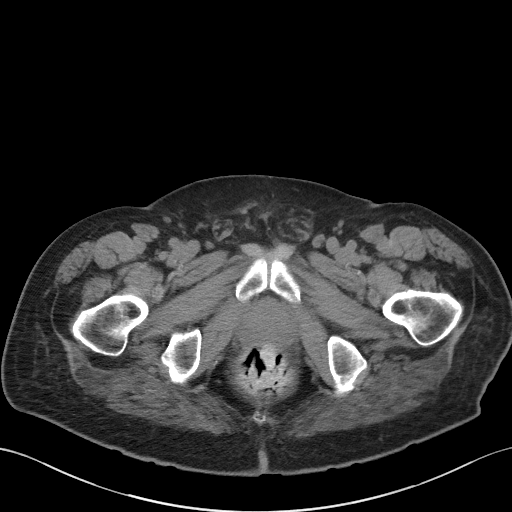
[im 23/99  soft-tissue]
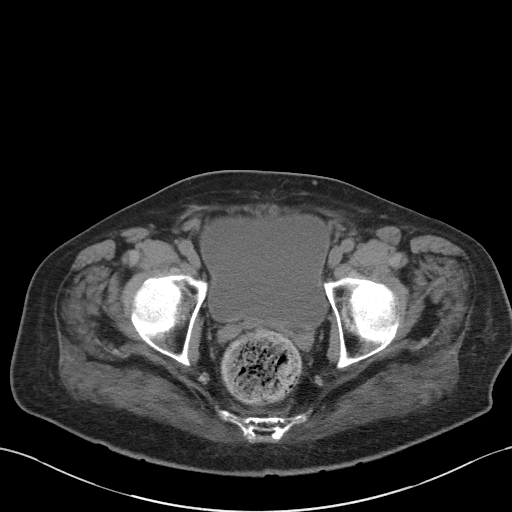
[im 32/99  soft-tissue]
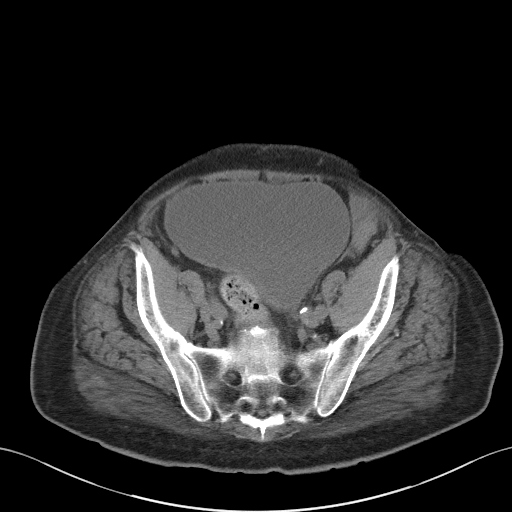
[im 41/99  soft-tissue]
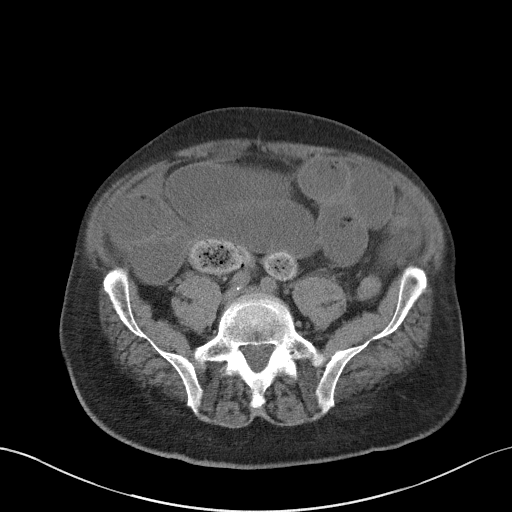
[im 49/99  soft-tissue]
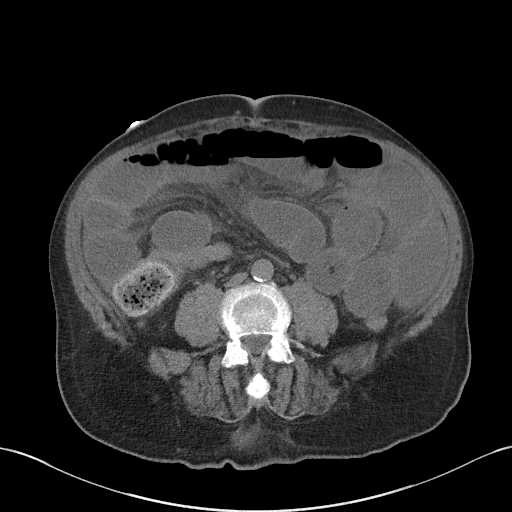
[im 58/99  soft-tissue]
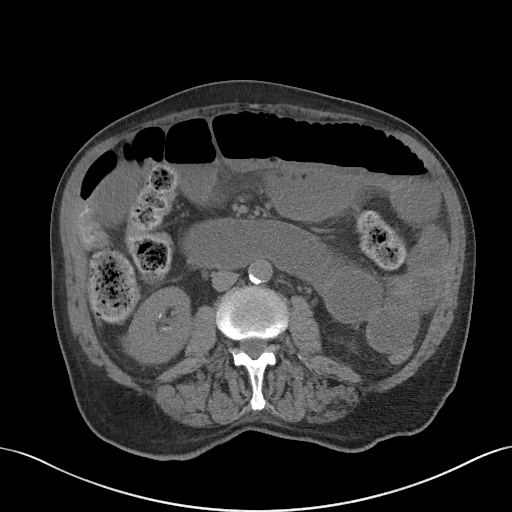
[im 67/99  soft-tissue]
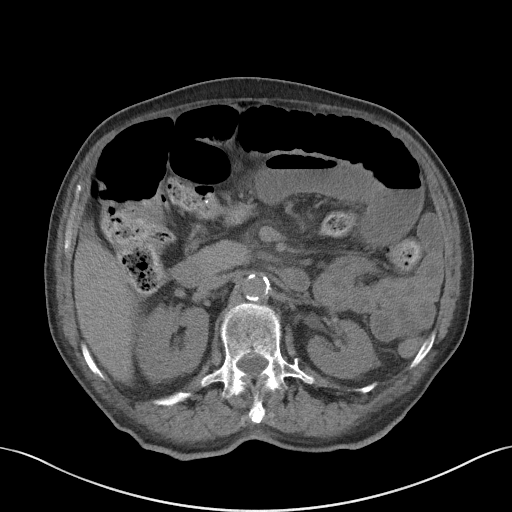
[im 76/99  soft-tissue]
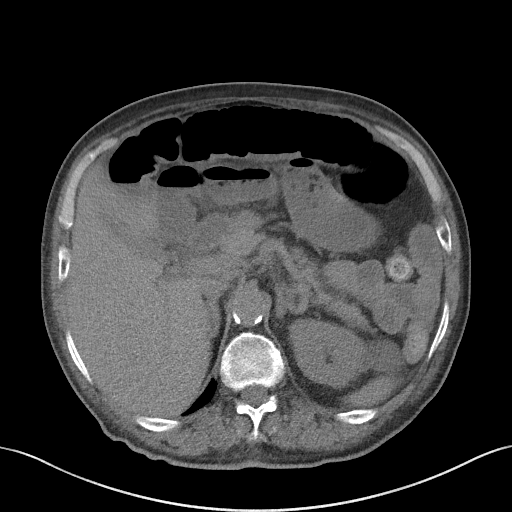
[im 76/99  bone]
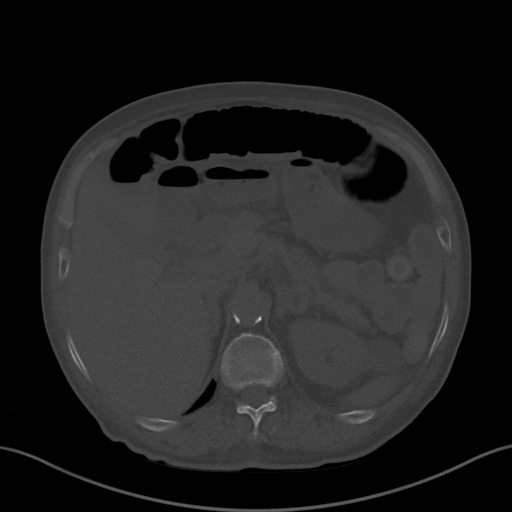
[im 85/99  soft-tissue]
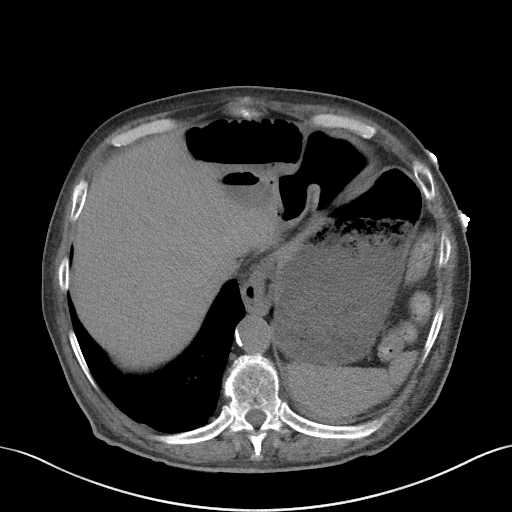
[im 94/99  soft-tissue]
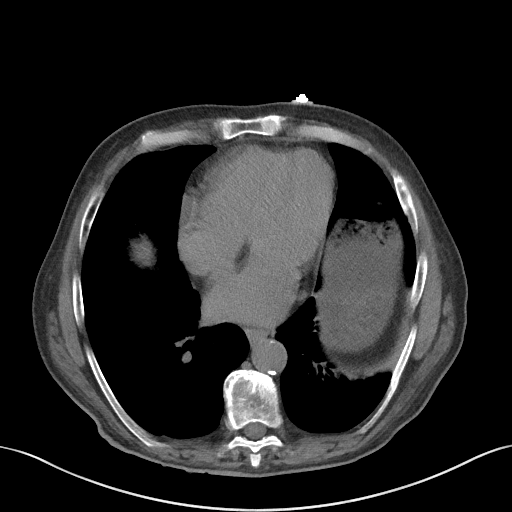

[Series 5: coronal st · coronal · 0.68mm/px · 3 of 103 slices shown]
[im 35/103  soft-tissue]
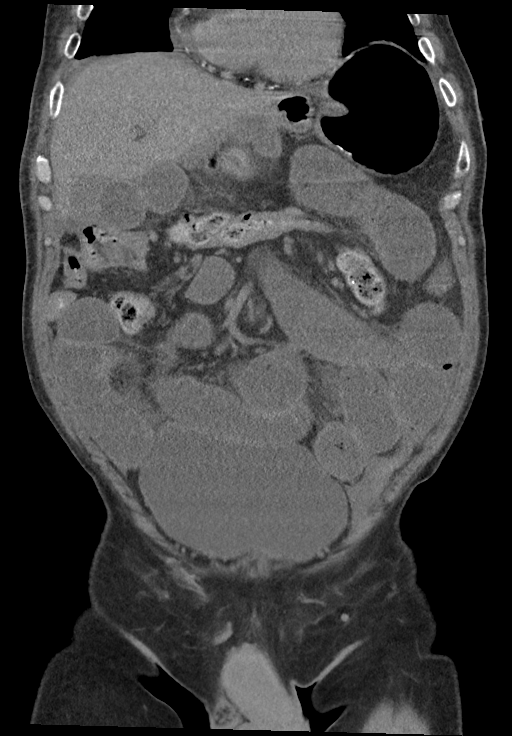
[im 46/103  soft-tissue]
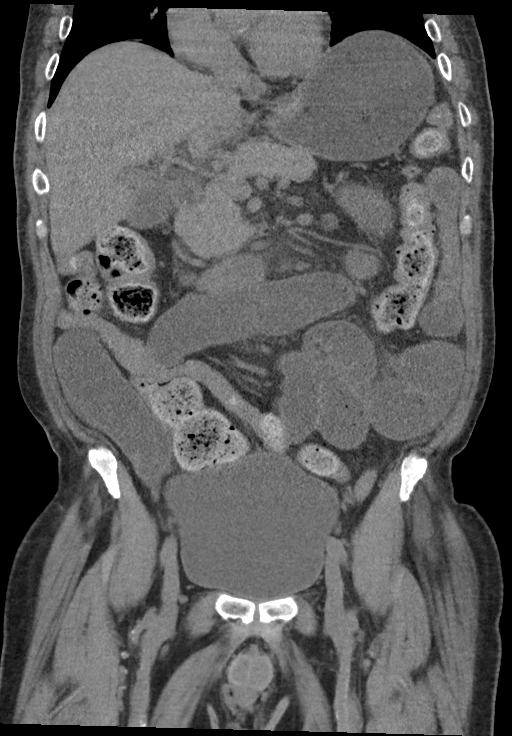
[im 57/103  soft-tissue]
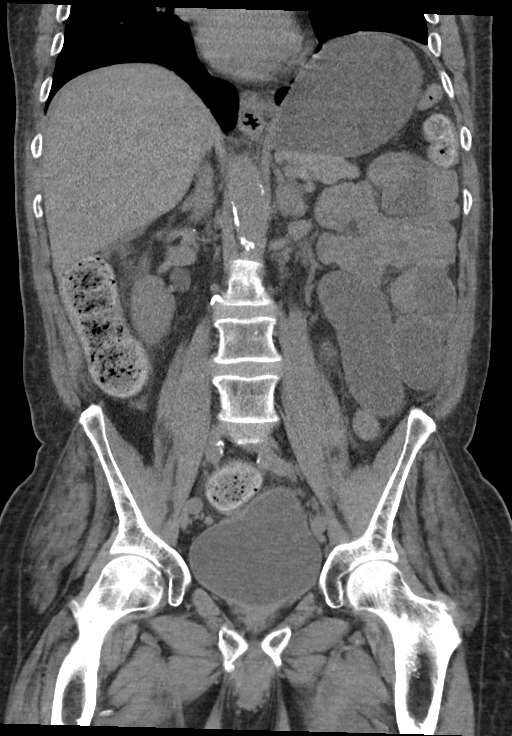

[14 of 46 positions shown; findings below may reference images not displayed]

FINDINGS: Lower chest: Calcified coronary artery atherosclerosis is evident on
series 2, image 1. Cardiac size at the upper limits of normal. No
pericardial effusion. Negative lung bases aside from mild
atelectasis.

Hepatobiliary: Negative noncontrast liver and partially contracted
gallbladder. There is trace free fluid along the inferior liver tip.

Pancreas: Negative noncontrast pancreas.

Spleen: Negative.

Adrenals/Urinary Tract: Normal adrenal glands. Bilateral
nephrolithiasis but no hydronephrosis or hydroureter.

However, the urinary bladder is distended, 460 mL.

Stomach/Bowel: Mild to moderate stool ball in the rectum. Retained
stool in the sigmoid colon which is redundant and tracks into the
right abdomen, and occupies most of the right gutter before crossing
midline back to the left. Descending colon is decompressed and stool
free. Redundant splenic flexure. Redundant transverse colon and
hepatic flexure with mild to moderate retained stool.

The cecum is on a lax mesentery located in the right upper abdomen
below the liver. Appendix remains normal on coronal image 32. The
terminal ileum an IC valve are on coronal image 37 and decompressed.
Just upstream of the TI there is an abrupt transition point (coronal
image 39 and series 2, image 38 from diffusely dilated proximal and
mid small bowel loops to the decompressed terminal ileum. Only the
ligament of Treitz and proximal jejunum are nondilated, and the
abnormal gas and fluid distended small bowel loops measure up to 40
mm diameter.

The stomach is also moderately distended with air and fluid. There
is no free air. There is trace free fluid in the small bowel
mesentery.

Vascular/Lymphatic: Aortoiliac calcified atherosclerosis. Normal
caliber abdominal aorta. Vascular patency is not evaluated in the
absence of IV contrast.

Reproductive: Negative.

Other: No pelvic free fluid.

Musculoskeletal: Chronic bilateral posterior and left lateral lower
rib fractures are new since 4119. Mild L2 superior endplate
compression on the left appears to be chronic. There is a degree of
osteopenia. No acute osseous abnormality identified.
IMPRESSION: 1. High-grade very distal Small Bowel Obstruction, with abrupt
transition point just upstream of the TI on series 2, image 38 and
coronal image 39. This transition point is in the mid abdomen, the
cecum is on a lax mesentery located in the right upper quadrant.
Trace free fluid. No free air.
2. Distended urinary bladder (460 mL), consider Foley catheter
placement. Bilateral nephrolithiasis without obstructive uropathy.
3. Stool ball in the rectum.
4. Chronic lower rib fractures are new since [DATE]. Aortic Atherosclerosis ([id number]).

## 2022-08-26 ENCOUNTER — Other Ambulatory Visit: Payer: Self-pay | Admitting: Cardiovascular Disease

## 2022-08-26 ENCOUNTER — Other Ambulatory Visit: Payer: Self-pay | Admitting: Internal Medicine

## 2022-09-30 DIAGNOSIS — R339 Retention of urine, unspecified: Secondary | ICD-10-CM | POA: Diagnosis not present

## 2022-10-13 ENCOUNTER — Other Ambulatory Visit: Payer: Medicare PPO

## 2022-10-13 DIAGNOSIS — I2510 Atherosclerotic heart disease of native coronary artery without angina pectoris: Secondary | ICD-10-CM

## 2022-10-13 DIAGNOSIS — G40909 Epilepsy, unspecified, not intractable, without status epilepticus: Secondary | ICD-10-CM

## 2022-10-13 DIAGNOSIS — E119 Type 2 diabetes mellitus without complications: Secondary | ICD-10-CM | POA: Diagnosis not present

## 2022-10-14 LAB — COMPREHENSIVE METABOLIC PANEL
ALT: 19 IU/L (ref 0–44)
AST: 18 IU/L (ref 0–40)
Albumin/Globulin Ratio: 1.6 (ref 1.2–2.2)
Albumin: 4.2 g/dL (ref 3.8–4.8)
Alkaline Phosphatase: 105 IU/L (ref 44–121)
BUN/Creatinine Ratio: 27 — ABNORMAL HIGH (ref 10–24)
BUN: 18 mg/dL (ref 8–27)
Bilirubin Total: 0.2 mg/dL (ref 0.0–1.2)
CO2: 23 mmol/L (ref 20–29)
Calcium: 9.3 mg/dL (ref 8.6–10.2)
Chloride: 100 mmol/L (ref 96–106)
Creatinine, Ser: 0.67 mg/dL — ABNORMAL LOW (ref 0.76–1.27)
Globulin, Total: 2.6 g/dL (ref 1.5–4.5)
Glucose: 96 mg/dL (ref 70–99)
Potassium: 4.5 mmol/L (ref 3.5–5.2)
Sodium: 137 mmol/L (ref 134–144)
Total Protein: 6.8 g/dL (ref 6.0–8.5)
eGFR: 99 mL/min/{1.73_m2} (ref >59)

## 2022-10-14 LAB — LIPID PANEL
Chol/HDL Ratio: 2.1 ratio (ref 0.0–5.0)
Cholesterol, Total: 131 mg/dL (ref 100–199)
HDL: 63 mg/dL (ref >39)
LDL Chol Calc (NIH): 58 mg/dL (ref 0–99)
Triglycerides: 38 mg/dL (ref 0–149)
VLDL Cholesterol Cal: 10 mg/dL (ref 5–40)

## 2022-10-14 LAB — HEMOGLOBIN A1C
Est. average glucose Bld gHb Est-mCnc: 114 mg/dL
Hgb A1c MFr Bld: 5.6 % (ref 4.8–5.6)

## 2022-10-14 LAB — PHENOBARBITAL LEVEL: Phenobarbital, Serum: 31 ug/mL (ref 15–40)

## 2022-10-18 ENCOUNTER — Ambulatory Visit (INDEPENDENT_AMBULATORY_CARE_PROVIDER_SITE_OTHER): Payer: Medicare PPO | Admitting: Internal Medicine

## 2022-10-18 VITALS — BP 122/72 | HR 75 | Ht 64.0 in | Wt 192.0 lb

## 2022-10-18 DIAGNOSIS — E782 Mixed hyperlipidemia: Secondary | ICD-10-CM | POA: Diagnosis not present

## 2022-10-18 DIAGNOSIS — E119 Type 2 diabetes mellitus without complications: Secondary | ICD-10-CM

## 2022-10-18 DIAGNOSIS — E7800 Pure hypercholesterolemia, unspecified: Secondary | ICD-10-CM

## 2022-10-18 DIAGNOSIS — N134 Hydroureter: Secondary | ICD-10-CM

## 2022-10-18 DIAGNOSIS — N3000 Acute cystitis without hematuria: Secondary | ICD-10-CM | POA: Diagnosis not present

## 2022-10-18 LAB — POCT URINALYSIS DIPSTICK
Bilirubin, UA: NEGATIVE
Blood, UA: NEGATIVE
Glucose, UA: NEGATIVE
Ketones, UA: NEGATIVE
Leukocytes, UA: NEGATIVE
Nitrite, UA: POSITIVE
Protein, UA: NEGATIVE
Spec Grav, UA: 1.015 (ref 1.010–1.025)
Urobilinogen, UA: 0.2 E.U./dL
pH, UA: 7 (ref 5.0–8.0)

## 2022-10-18 LAB — POCT CBG (FASTING - GLUCOSE)-MANUAL ENTRY: Glucose Fasting, POC: 100 mg/dL — AB (ref 70–99)

## 2022-10-18 MED ORDER — CIPROFLOXACIN HCL 500 MG PO TABS
500.0000 mg | ORAL_TABLET | Freq: Two times a day (BID) | ORAL | 0 refills | Status: AC
Start: 2022-10-18 — End: 2022-10-23

## 2022-10-18 MED ORDER — ROSUVASTATIN CALCIUM 40 MG PO TABS
40.0000 mg | ORAL_TABLET | Freq: Every evening | ORAL | 0 refills | Status: DC
Start: 2022-10-18 — End: 2023-01-07

## 2022-10-18 NOTE — Progress Notes (Signed)
Established Patient Office Visit  Subjective:  Patient ID: Edward Thompson, male    DOB: [birth year]  Age: 72 y.o. MRN: [id number]  Chief Complaint  Patient presents with   Follow-up    3 Months Follow up    C/o urinary frequency and dysuria. Here for lab review and medication refills. UA results noted. LDL and TC well controlled on lab review. Triglycerides also satisfactory. Phenobarbital level satisfactory with unremarkable cmp.    No other concerns at this time.   Past Medical History:  Diagnosis Date   CAD (coronary artery disease)    Carotid stenosis, left 08/2017   Diabetes mellitus without complication (HCC)    Foot drop    Fracture of neck (HCC) 2008   fell off a roof. required halo x 4 months. also fractured alot of vertebrae   Heart attack (HCC) 2015   Heart disease    Hepatitis    6th grade    Hyperlipidemia    Hypertension    Myocardial infarction acute (HCC) 2015   Seizures (HCC)    taking phenobarbitol and dilantin. LAST SEIZURE WAS 2016. well controlled on meds   Sleep apnea    USES CPAP   Syncope 2019   Vertigo    Viral meningitis     Past Surgical History:  Procedure Laterality Date   CARDIAC SURGERY     COLONOSCOPY     COLONOSCOPY WITH PROPOFOL N/A 03/11/2021   Procedure: COLONOSCOPY WITH PROPOFOL;  Surgeon: Toledo, Boykin Nearing, MD;  Location: [facility];  Service: Gastroenterology;  Laterality: N/A;   CORONARY ANGIOPLASTY     PATIENT UNAWARE OF THIS   CORONARY ARTERY BYPASS GRAFT  2015   ENDARTERECTOMY Left 08/19/2017   Procedure: ENDARTERECTOMY CAROTID;  Surgeon: Renford Dills, MD;  Location: [facility];  Service: Vascular;  Laterality: Left;   ESOPHAGOGASTRODUODENOSCOPY N/A 03/11/2021   Procedure: ESOPHAGOGASTRODUODENOSCOPY (EGD);  Surgeon: Toledo, Boykin Nearing, MD;  Location: [facility];  Service: Gastroenterology;  Laterality: N/A;  DM   HERNIA REPAIR Right 1985   inguinal    Social History   Socioeconomic History   Marital  status: Widowed    Spouse name: Not on file   Number of children: Not on file   Years of education: Not on file   Highest education level: Not on file  Occupational History   Not on file  Tobacco Use   Smoking status: Never   Smokeless tobacco: Never  Vaping Use   Vaping Use: Never used  Substance and Sexual Activity   Alcohol use: No   Drug use: No   Sexual activity: Not Currently  Other Topics Concern   Not on file  Social History Narrative   Not on file   Social Determinants of Health   Financial Resource Strain: Not on file  Food Insecurity: Not on file  Transportation Needs: Not on file  Physical Activity: Not on file  Stress: Not on file  Social Connections: Not on file  Intimate Partner Violence: Not on file    Family History  Problem Relation Age of Onset   Heart disease Mother    Heart attack Father     No Known Allergies  Review of Systems  Constitutional: Negative.   HENT: Negative.    Eyes: Negative.   Respiratory: Negative.    Cardiovascular: Negative.   Gastrointestinal: Negative.   Genitourinary: Negative.   Skin: Negative.   Neurological: Negative.  Negative for focal weakness and seizures.  Endo/Heme/Allergies: Negative.  Objective:   BP 122/72   Pulse 75   Ht 5\' 4"  (1.626 m)   Wt 192 lb (87.1 kg)   SpO2 95%   BMI 32.96 kg/m   Vitals:   10/18/22 1327  BP: 122/72  Pulse: 75  Height: 5\' 4"  (1.626 m)  Weight: 192 lb (87.1 kg)  SpO2: 95%  BMI (Calculated): 32.94    Physical Exam Vitals reviewed.  Constitutional:      Appearance: Normal appearance. He is obese.     Comments: Seated in a wheelchair  HENT:     Head: Normocephalic.     Left Ear: There is no impacted cerumen.     Nose: Nose normal.     Mouth/Throat:     Mouth: Mucous membranes are moist.     Pharynx: No posterior oropharyngeal erythema.  Eyes:     Extraocular Movements: Extraocular movements intact.     Pupils: Pupils are equal, round, and reactive  to light.  Cardiovascular:     Rate and Rhythm: Regular rhythm.     Chest Wall: PMI is not displaced.     Pulses: Normal pulses.     Heart sounds: Normal heart sounds. No murmur heard. Pulmonary:     Effort: Pulmonary effort is normal.     Breath sounds: Normal air entry. No rhonchi or rales.  Abdominal:     General: Abdomen is flat. Bowel sounds are normal. There is no distension.     Palpations: Abdomen is soft. There is no hepatomegaly, splenomegaly or mass.     Tenderness: There is no abdominal tenderness.  Musculoskeletal:        General: Normal range of motion.     Cervical back: Normal range of motion and neck supple.     Right lower leg: Edema present.     Left lower leg: Edema present.  Skin:    General: Skin is warm and dry.  Neurological:     General: No focal deficit present.     Mental Status: He is alert and oriented to person, place, and time.     Cranial Nerves: No cranial nerve deficit.     Motor: No weakness.  Psychiatric:        Mood and Affect: Mood normal.        Behavior: Behavior normal.      Results for orders placed or performed in visit on 10/18/22  POCT CBG (Fasting - Glucose)  Result Value Ref Range   Glucose Fasting, POC 100 (A) 70 - 99 mg/dL  POCT Urinalysis Dipstick (81002)  Result Value Ref Range   Color, UA Yellow    Clarity, UA slightly cloudy    Glucose, UA Negative Negative   Bilirubin, UA Negative    Ketones, UA Negative    Spec Grav, UA 1.015 1.010 - 1.025   Blood, UA Negative    pH, UA 7.0 5.0 - 8.0   Protein, UA Negative Negative   Urobilinogen, UA 0.2 0.2 or 1.0 E.U./dL   Nitrite, UA Positive    Leukocytes, UA Negative Negative   Appearance     Odor      Recent Results (from the past 2160 hour(Nichlos Kunzler))  Phenobarbital level     Status: None   Collection Time: 10/13/22 10:32 AM  Result Value Ref Range   Phenobarbital, Serum 31 15 - 40 ug/mL    Comment:  Detection Limit = 3  Comprehensive  metabolic panel     Status: Abnormal   Collection Time: 10/13/22 10:32 AM  Result Value Ref Range   Glucose 96 70 - 99 mg/dL   BUN 18 8 - 27 mg/dL   Creatinine, Ser 1.61 (L) 0.76 - 1.27 mg/dL   eGFR 99 >09 UE/AVW/0.98   BUN/Creatinine Ratio 27 (H) 10 - 24   Sodium 137 134 - 144 mmol/L   Potassium 4.5 3.5 - 5.2 mmol/L   Chloride 100 96 - 106 mmol/L   CO2 23 20 - 29 mmol/L   Calcium 9.3 8.6 - 10.2 mg/dL   Total Protein 6.8 6.0 - 8.5 g/dL   Albumin 4.2 3.8 - 4.8 g/dL   Globulin, Total 2.6 1.5 - 4.5 g/dL   Albumin/Globulin Ratio 1.6 1.2 - 2.2   Bilirubin Total 0.2 0.0 - 1.2 mg/dL   Alkaline Phosphatase 105 44 - 121 IU/L   AST 18 0 - 40 IU/L   ALT 19 0 - 44 IU/L  Hemoglobin A1c     Status: None   Collection Time: 10/13/22 10:32 AM  Result Value Ref Range   Hgb A1c MFr Bld 5.6 4.8 - 5.6 %    Comment:          Prediabetes: 5.7 - 6.4          Diabetes: >6.4          Glycemic control for adults with diabetes: <7.0    Est. average glucose Bld gHb Est-mCnc 114 mg/dL  Lipid panel     Status: None   Collection Time: 10/13/22 10:32 AM  Result Value Ref Range   Cholesterol, Total 131 100 - 199 mg/dL   Triglycerides 38 0 - 149 mg/dL   HDL 63 >11 mg/dL   VLDL Cholesterol Cal 10 5 - 40 mg/dL   LDL Chol Calc (NIH) 58 0 - 99 mg/dL   Chol/HDL Ratio 2.1 0.0 - 5.0 ratio    Comment:                                   T. Chol/HDL Ratio                                             Men  Women                               1/2 Avg.Risk  3.4    3.3                                   Avg.Risk  5.0    4.4                                2X Avg.Risk  9.6    7.1                                3X Avg.Risk 23.4   11.0   POCT CBG (Fasting - Glucose)     Status: Abnormal   Collection Time: 10/18/22  1:30  PM  Result Value Ref Range   Glucose Fasting, POC 100 (A) 70 - 99 mg/dL  POCT Urinalysis Dipstick (16109)     Status: Abnormal   Collection Time: 10/18/22  1:33 PM  Result Value Ref Range   Color, UA  Yellow    Clarity, UA slightly cloudy    Glucose, UA Negative Negative   Bilirubin, UA Negative    Ketones, UA Negative    Spec Grav, UA 1.015 1.010 - 1.025   Blood, UA Negative    pH, UA 7.0 5.0 - 8.0   Protein, UA Negative Negative   Urobilinogen, UA 0.2 0.2 or 1.0 E.U./dL   Nitrite, UA Positive    Leukocytes, UA Negative Negative   Appearance     Odor        Assessment & Plan:  As per problem list  Problem List Items Addressed This Visit       Endocrine   Diabetes mellitus without complication (HCC) - Primary   Relevant Medications   rosuvastatin (CRESTOR) 40 MG tablet   Other Relevant Orders   POCT CBG (Fasting - Glucose) (Completed)   Hemoglobin A1c     Genitourinary   Hydroureter   Relevant Orders   POCT Urinalysis Dipstick (60454) (Completed)     Other   Hyperlipidemia   Relevant Medications   rosuvastatin (CRESTOR) 40 MG tablet   Other Relevant Orders   Lipid panel   Comprehensive metabolic panel   Other Visit Diagnoses     Acute cystitis without hematuria       Relevant Medications   ciprofloxacin (CIPRO) 500 MG tablet   Other Relevant Orders   Urine Culture       Return in about 3 months (around 01/18/2023) for awv with labs prior.   Total time spent: 30 minutes  Luna Fuse, MD  10/18/2022   This document may have been prepared by [facility] Voice Recognition software and as such may include unintentional dictation errors.

## 2022-10-20 LAB — URINE CULTURE

## 2022-10-28 DIAGNOSIS — L02212 Cutaneous abscess of back [any part, except buttock]: Secondary | ICD-10-CM | POA: Diagnosis not present

## 2022-10-30 DIAGNOSIS — L02212 Cutaneous abscess of back [any part, except buttock]: Secondary | ICD-10-CM | POA: Diagnosis not present

## 2022-12-16 ENCOUNTER — Ambulatory Visit: Payer: Medicare PPO | Admitting: Cardiovascular Disease

## 2022-12-16 ENCOUNTER — Encounter: Payer: Self-pay | Admitting: Cardiovascular Disease

## 2022-12-16 VITALS — BP 130/70 | HR 91 | Ht 64.0 in | Wt 192.0 lb

## 2022-12-16 DIAGNOSIS — E782 Mixed hyperlipidemia: Secondary | ICD-10-CM

## 2022-12-16 DIAGNOSIS — R609 Edema, unspecified: Secondary | ICD-10-CM | POA: Diagnosis not present

## 2022-12-16 DIAGNOSIS — G4733 Obstructive sleep apnea (adult) (pediatric): Secondary | ICD-10-CM | POA: Diagnosis not present

## 2022-12-16 DIAGNOSIS — Z955 Presence of coronary angioplasty implant and graft: Secondary | ICD-10-CM

## 2022-12-16 DIAGNOSIS — R55 Syncope and collapse: Secondary | ICD-10-CM

## 2022-12-16 DIAGNOSIS — G40909 Epilepsy, unspecified, not intractable, without status epilepticus: Secondary | ICD-10-CM | POA: Diagnosis not present

## 2022-12-16 DIAGNOSIS — Z9889 Other specified postprocedural states: Secondary | ICD-10-CM

## 2022-12-16 DIAGNOSIS — I2510 Atherosclerotic heart disease of native coronary artery without angina pectoris: Secondary | ICD-10-CM

## 2022-12-16 MED ORDER — FUROSEMIDE 20 MG PO TABS
20.0000 mg | ORAL_TABLET | Freq: Every day | ORAL | 11 refills | Status: DC
Start: 2022-12-16 — End: 2023-12-29

## 2022-12-16 NOTE — Progress Notes (Signed)
Cardiology Office Note   Date:  12/16/2022   ID:  Jaen, Schildt 1951/04/05, MRN [id number]  PCP:  Sherron Monday, MD  Cardiologist:  Adrian Blackwater, MD      History of Present Illness: Edward Thompson is a 72 y.o. male who presents for  Chief Complaint  Patient presents with   Follow-up    4 mo f/u    Swelling of legs, no shortness of breath/chest aoin.      Past Medical History:  Diagnosis Date   CAD (coronary artery disease)    Carotid stenosis, left 08/2017   Diabetes mellitus without complication (HCC)    Foot drop    Fracture of neck (HCC) 2008   fell off a roof. required halo x 4 months. also fractured alot of vertebrae   Heart attack (HCC) 2015   Heart disease    Hepatitis    6th grade    Hyperlipidemia    Hypertension    Myocardial infarction acute (HCC) 2015   Seizures (HCC)    taking phenobarbitol and dilantin. LAST SEIZURE WAS 2016. well controlled on meds   Sleep apnea    USES CPAP   Syncope 2019   Vertigo    Viral meningitis      Past Surgical History:  Procedure Laterality Date   CARDIAC SURGERY     COLONOSCOPY     COLONOSCOPY WITH PROPOFOL N/A 03/11/2021   Procedure: COLONOSCOPY WITH PROPOFOL;  Surgeon: Toledo, Boykin Nearing, MD;  Location: [facility];  Service: Gastroenterology;  Laterality: N/A;   CORONARY ANGIOPLASTY     PATIENT UNAWARE OF THIS   CORONARY ARTERY BYPASS GRAFT  2015   ENDARTERECTOMY Left 08/19/2017   Procedure: ENDARTERECTOMY CAROTID;  Surgeon: Renford Dills, MD;  Location: [facility];  Service: Vascular;  Laterality: Left;   ESOPHAGOGASTRODUODENOSCOPY N/A 03/11/2021   Procedure: ESOPHAGOGASTRODUODENOSCOPY (EGD);  Surgeon: Toledo, Boykin Nearing, MD;  Location: [facility];  Service: Gastroenterology;  Laterality: N/A;  DM   HERNIA REPAIR Right 1985   inguinal     Current Outpatient Medications  Medication Sig Dispense Refill   furosemide (LASIX) 20 MG tablet Take 1 tablet (20 mg total) by mouth daily. 30  tablet 11   Accu-Chek Softclix Lancets lancets TEST BLOOD SUGAR THREE TIMES DAILY 300 each 11   acetaminophen (TYLENOL) 500 MG tablet Take 500 mg by mouth every 6 (six) hours as needed.     amLODipine (NORVASC) 5 MG tablet Take 0.5 tablets (2.5 mg total) by mouth daily.     aspirin EC 81 MG tablet Take 81 mg by mouth at bedtime.     Blood Glucose Monitoring Suppl (ACCU-CHEK GUIDE) w/Device KIT      clopidogrel (PLAVIX) 75 MG tablet TAKE 1 TABLET EVERY DAY 90 tablet 3   escitalopram (LEXAPRO) 10 MG tablet TAKE 1 TABLET EVERY MORNING 90 tablet 3   feeding supplement, GLUCERNA SHAKE, (GLUCERNA SHAKE) LIQD Take 237 mLs by mouth 2 (two) times daily between meals. 1000 mL 12   glucose blood (ACCU-CHEK GUIDE) test strip USE WITH DIABETIC METER TO TEST BLOOD SUGAR THREE TIMES DAILY 300 strip 3   isosorbide mononitrate (IMDUR) 30 MG 24 hr tablet Take 1 tablet (30 mg total) by mouth daily. 90 tablet 3   latanoprost (XALATAN) 0.005 % ophthalmic solution 1 drop at bedtime.     meclizine (ANTIVERT) 25 MG tablet TAKE 1 TABLET BY MOUTH 4 TIMES DAILY 360 tablet 1   metFORMIN (GLUCOPHAGE) 500 MG  tablet TAKE 1 TABLET EVERY MORNING 90 tablet 1   metFORMIN (GLUCOPHAGE) 850 MG tablet Take by mouth.     metoprolol succinate (TOPROL-XL) 25 MG 24 hr tablet TAKE 1 TABLET EVERY DAY 90 tablet 3   Multiple Vitamin (MULTIVITAMIN) tablet Take 1 tablet by mouth daily.     nitroGLYCERIN (NITROSTAT) 0.4 MG SL tablet Place 0.4 mg under the tongue every 5 (five) minutes as needed for chest pain.     pantoprazole (PROTONIX) 40 MG tablet TAKE 1 TABLET EVERY MORNING 90 tablet 3   PHENobarbital (LUMINAL) 64.8 MG tablet TAKE 2 TABLETS AT BEDTIME 180 tablet 1   phenytoin (DILANTIN) 100 MG ER capsule TAKE 3 CAPSULES AT BEDTIME 180 capsule 5   Pollen Extracts (PROSTAT PO) Take 30 mLs by mouth daily.     polyethylene glycol (MIRALAX / GLYCOLAX) 17 g packet Take 17 g by mouth daily. 14 each 0   rosuvastatin (CRESTOR) 40 MG tablet Take 1  tablet (40 mg total) by mouth at bedtime. 90 tablet 0   senna-docusate (SENOKOT-S) 8.6-50 MG tablet Take 1 tablet by mouth at bedtime.     No current facility-administered medications for this visit.    Allergies:   Patient has no known allergies.    Social History:   reports that he has never smoked. He has never used smokeless tobacco. He reports that he does not drink alcohol and does not use drugs.   Family History:  family history includes Heart attack in his father; Heart disease in his mother.    ROS:     Review of Systems  Constitutional: Negative.   HENT: Negative.    Eyes: Negative.   Respiratory: Negative.    Gastrointestinal: Negative.   Genitourinary: Negative.   Musculoskeletal: Negative.   Skin: Negative.   Neurological: Negative.   Endo/Heme/Allergies: Negative.   Psychiatric/Behavioral: Negative.    All other systems reviewed and are negative.     All other systems are reviewed and negative.    PHYSICAL EXAM: VS:  BP 130/70   Pulse 91   Ht 5\' 4"  (1.626 m)   Wt 192 lb (87.1 kg)   SpO2 97%   BMI 32.96 kg/m  , BMI Body mass index is 32.96 kg/m. Last weight:  Wt Readings from Last 3 Encounters:  12/16/22 192 lb (87.1 kg)  10/18/22 192 lb (87.1 kg)  08/12/22 194 lb (88 kg)     Physical Exam Vitals reviewed.  Constitutional:      Appearance: Normal appearance. He is normal weight.  HENT:     Head: Normocephalic.     Nose: Nose normal.     Mouth/Throat:     Mouth: Mucous membranes are moist.  Eyes:     Pupils: Pupils are equal, round, and reactive to light.  Cardiovascular:     Rate and Rhythm: Normal rate and regular rhythm.     Pulses: Normal pulses.     Heart sounds: Normal heart sounds.  Pulmonary:     Effort: Pulmonary effort is normal.  Abdominal:     General: Abdomen is flat. Bowel sounds are normal.  Musculoskeletal:        General: Normal range of motion.     Cervical back: Normal range of motion.  Skin:    General: Skin is  warm.  Neurological:     General: No focal deficit present.     Mental Status: He is alert.  Psychiatric:        Mood and Affect:  Mood normal.       EKG:   Recent Labs: 01/15/2022: Hemoglobin 12.5; Platelets 180 10/13/2022: ALT 19; BUN 18; Creatinine, Ser 0.67; Potassium 4.5; Sodium 137    Lipid Panel    Component Value Date/Time   CHOL 131 10/13/2022 1032   CHOL 117 09/23/2013 0132   TRIG 38 10/13/2022 1032   TRIG 64 09/23/2013 0132   HDL 63 10/13/2022 1032   HDL 49 09/23/2013 0132   CHOLHDL 2.1 10/13/2022 1032   CHOLHDL 3.5 02/06/2016 0419   VLDL 28 02/06/2016 0419   VLDL 13 09/23/2013 0132   LDLCALC 58 10/13/2022 1032   LDLCALC 55 09/23/2013 0132      Other studies Reviewed: Additional studies/ records that were reviewed today include:  Review of the above records demonstrates:       No data to display            ASSESSMENT AND PLAN:    ICD-10-CM   1. Syncope, unspecified syncope type  R55 furosemide (LASIX) 20 MG tablet    2. Mixed hyperlipidemia  E78.2 furosemide (LASIX) 20 MG tablet    3. CAD s/p coronary artery stent placement  Z95.5 furosemide (LASIX) 20 MG tablet    4. OSA on CPAP  G47.33 furosemide (LASIX) 20 MG tablet    5. Seizure disorder (HCC)  G40.909 furosemide (LASIX) 20 MG tablet    6. Athscl heart disease of native coronary artery w/o ang pctrs  I25.10 furosemide (LASIX) 20 MG tablet    7. Coronary artery disease involving native coronary artery of native heart without angina pectoris  I25.10 furosemide (LASIX) 20 MG tablet    8. S/P carotid endarterectomy  Z98.890 furosemide (LASIX) 20 MG tablet    9. Edema, unspecified type  R60.9    lasix 20 daily prn       Problem List Items Addressed This Visit       Cardiovascular and Mediastinum   CAD (coronary artery disease)   Relevant Medications   furosemide (LASIX) 20 MG tablet   Athscl heart disease of native coronary artery w/o ang pctrs   Relevant Medications   furosemide  (LASIX) 20 MG tablet     Respiratory   OSA on CPAP   Relevant Medications   furosemide (LASIX) 20 MG tablet     Nervous and Auditory   Seizure disorder (HCC)   Relevant Medications   furosemide (LASIX) 20 MG tablet     Other   Syncope - Primary   Relevant Medications   furosemide (LASIX) 20 MG tablet   Hyperlipidemia   Relevant Medications   furosemide (LASIX) 20 MG tablet   S/P carotid endarterectomy   Relevant Medications   furosemide (LASIX) 20 MG tablet   CAD s/p coronary artery stent placement   Relevant Medications   furosemide (LASIX) 20 MG tablet   Other Visit Diagnoses     Edema, unspecified type       lasix 20 daily prn          Disposition:   No follow-ups on file.    Total time spent: 30 minutes  Signed,  Adrian Blackwater, MD  12/16/2022 1:14 PM    [facility]

## 2022-12-21 DIAGNOSIS — H2513 Age-related nuclear cataract, bilateral: Secondary | ICD-10-CM | POA: Diagnosis not present

## 2022-12-21 DIAGNOSIS — H40003 Preglaucoma, unspecified, bilateral: Secondary | ICD-10-CM | POA: Diagnosis not present

## 2022-12-21 DIAGNOSIS — E119 Type 2 diabetes mellitus without complications: Secondary | ICD-10-CM | POA: Diagnosis not present

## 2022-12-24 DIAGNOSIS — R339 Retention of urine, unspecified: Secondary | ICD-10-CM | POA: Diagnosis not present

## 2022-12-29 ENCOUNTER — Encounter: Payer: Self-pay | Admitting: Internal Medicine

## 2023-01-06 ENCOUNTER — Other Ambulatory Visit: Payer: Self-pay | Admitting: Internal Medicine

## 2023-01-06 DIAGNOSIS — E7800 Pure hypercholesterolemia, unspecified: Secondary | ICD-10-CM

## 2023-01-07 ENCOUNTER — Other Ambulatory Visit: Payer: Self-pay

## 2023-01-07 ENCOUNTER — Telehealth: Payer: Self-pay

## 2023-01-07 NOTE — Telephone Encounter (Signed)
Spoke with patient daughter and was able to confirm Rx request has been received and to allow the provider 24-28 hrs to respond to request.

## 2023-01-07 NOTE — Telephone Encounter (Signed)
Patient daughter called asking for a call back

## 2023-01-08 ENCOUNTER — Other Ambulatory Visit: Payer: Self-pay | Admitting: Cardiovascular Disease

## 2023-01-14 ENCOUNTER — Other Ambulatory Visit: Payer: Medicare PPO

## 2023-01-14 DIAGNOSIS — E119 Type 2 diabetes mellitus without complications: Secondary | ICD-10-CM | POA: Diagnosis not present

## 2023-01-14 DIAGNOSIS — E782 Mixed hyperlipidemia: Secondary | ICD-10-CM | POA: Diagnosis not present

## 2023-01-15 LAB — COMPREHENSIVE METABOLIC PANEL
ALT: 12 IU/L (ref 0–44)
AST: 14 IU/L (ref 0–40)
Albumin: 4.2 g/dL (ref 3.8–4.8)
Alkaline Phosphatase: 91 IU/L (ref 44–121)
BUN/Creatinine Ratio: 19 (ref 10–24)
BUN: 13 mg/dL (ref 8–27)
Bilirubin Total: 0.2 mg/dL (ref 0.0–1.2)
CO2: 25 mmol/L (ref 20–29)
Calcium: 9.1 mg/dL (ref 8.6–10.2)
Chloride: 102 mmol/L (ref 96–106)
Creatinine, Ser: 0.69 mg/dL — ABNORMAL LOW (ref 0.76–1.27)
Globulin, Total: 2.8 g/dL (ref 1.5–4.5)
Glucose: 97 mg/dL (ref 70–99)
Potassium: 4.6 mmol/L (ref 3.5–5.2)
Sodium: 139 mmol/L (ref 134–144)
Total Protein: 7 g/dL (ref 6.0–8.5)
eGFR: 98 mL/min/{1.73_m2} (ref >59)

## 2023-01-15 LAB — LIPID PANEL
Chol/HDL Ratio: 2.4 ratio (ref 0.0–5.0)
Cholesterol, Total: 138 mg/dL (ref 100–199)
HDL: 57 mg/dL (ref >39)
LDL Chol Calc (NIH): 70 mg/dL (ref 0–99)
Triglycerides: 50 mg/dL (ref 0–149)
VLDL Cholesterol Cal: 11 mg/dL (ref 5–40)

## 2023-01-15 LAB — HEMOGLOBIN A1C
Est. average glucose Bld gHb Est-mCnc: 114 mg/dL
Hgb A1c MFr Bld: 5.6 % (ref 4.8–5.6)

## 2023-01-17 ENCOUNTER — Encounter: Payer: Self-pay | Admitting: Internal Medicine

## 2023-01-17 ENCOUNTER — Ambulatory Visit (INDEPENDENT_AMBULATORY_CARE_PROVIDER_SITE_OTHER): Payer: Medicare PPO | Admitting: Internal Medicine

## 2023-01-17 VITALS — BP 125/79 | HR 79 | Ht 64.0 in | Wt 192.0 lb

## 2023-01-17 DIAGNOSIS — E782 Mixed hyperlipidemia: Secondary | ICD-10-CM

## 2023-01-17 DIAGNOSIS — E119 Type 2 diabetes mellitus without complications: Secondary | ICD-10-CM | POA: Diagnosis not present

## 2023-01-17 DIAGNOSIS — Z0001 Encounter for general adult medical examination with abnormal findings: Secondary | ICD-10-CM | POA: Diagnosis not present

## 2023-01-17 DIAGNOSIS — I10 Essential (primary) hypertension: Secondary | ICD-10-CM | POA: Diagnosis not present

## 2023-01-17 DIAGNOSIS — N390 Urinary tract infection, site not specified: Secondary | ICD-10-CM | POA: Diagnosis not present

## 2023-01-17 LAB — POCT URINALYSIS DIPSTICK
Bilirubin, UA: 1
Blood, UA: NEGATIVE
Glucose, UA: NEGATIVE
Ketones, UA: NEGATIVE
Leukocytes, UA: NEGATIVE
Nitrite, UA: POSITIVE
Protein, UA: POSITIVE — AB
Spec Grav, UA: 1.025 (ref 1.010–1.025)
Urobilinogen, UA: 0.2 U/dL
pH, UA: 6 (ref 5.0–8.0)

## 2023-01-17 MED ORDER — CIPROFLOXACIN HCL 500 MG PO TABS
500.0000 mg | ORAL_TABLET | Freq: Two times a day (BID) | ORAL | 0 refills | Status: AC
Start: 2023-01-17 — End: 2023-01-24

## 2023-01-17 NOTE — Progress Notes (Signed)
Established Patient Office Visit  Subjective:  Patient ID: Edward Thompson, male    DOB: February 21, 1951  Age: 72 y.o. MRN: [id number]  Chief Complaint  Patient presents with  . Medicare Wellness    AWV with lab results    No new complaints, here for AWV refer to quality metrics and scanned documents.  LDL and TC well controlled on lab review. Triglycerides also satisfactory. A1c remains normal with unremarkable cbc and cmp. Daughter reports maladorous urine but no systemic symptoms. No other concerns at this time.   Past Medical History:  Diagnosis Date  . CAD (coronary artery disease)   . Carotid stenosis, left 08/2017  . Diabetes mellitus without complication (HCC)   . Foot drop   . Fracture of neck (HCC) 2008   fell off a roof. required halo x 4 months. also fractured alot of vertebrae  . Heart attack (HCC) 2015  . Heart disease   . Hepatitis    6th grade   . Hyperlipidemia   . Hypertension   . Myocardial infarction acute (HCC) 2015  . Seizures (HCC)    taking phenobarbitol and dilantin. LAST SEIZURE WAS 2016. well controlled on meds  . Sleep apnea    USES CPAP  . Syncope 2019  . Vertigo   . Viral meningitis     Past Surgical History:  Procedure Laterality Date  . CARDIAC SURGERY    . COLONOSCOPY    . COLONOSCOPY WITH PROPOFOL N/A 03/11/2021   Procedure: COLONOSCOPY WITH PROPOFOL;  Surgeon: Toledo, Boykin Nearing, MD;  Location: [facility];  Service: Gastroenterology;  Laterality: N/A;  . CORONARY ANGIOPLASTY     PATIENT UNAWARE OF THIS  . CORONARY ARTERY BYPASS GRAFT  2015  . ENDARTERECTOMY Left 08/19/2017   Procedure: ENDARTERECTOMY CAROTID;  Surgeon: Renford Dills, MD;  Location: [facility];  Service: Vascular;  Laterality: Left;  . ESOPHAGOGASTRODUODENOSCOPY N/A 03/11/2021   Procedure: ESOPHAGOGASTRODUODENOSCOPY (EGD);  Surgeon: Toledo, Boykin Nearing, MD;  Location: [facility];  Service: Gastroenterology;  Laterality: N/A;  DM  . HERNIA REPAIR Right 1985    inguinal    Social History   Socioeconomic History  . Marital status: Widowed    Spouse name: Not on file  . Number of children: Not on file  . Years of education: Not on file  . Highest education level: Not on file  Occupational History  . Not on file  Tobacco Use  . Smoking status: Never  . Smokeless tobacco: Never  Vaping Use  . Vaping status: Never Used  Substance and Sexual Activity  . Alcohol use: No  . Drug use: No  . Sexual activity: Not Currently  Other Topics Concern  . Not on file  Social History Narrative  . Not on file   Social Determinants of Health   Financial Resource Strain: Not on file  Food Insecurity: Not on file  Transportation Needs: Not on file  Physical Activity: Not on file  Stress: Not on file  Social Connections: Not on file  Intimate Partner Violence: Not on file    Family History  Problem Relation Age of Onset  . Heart disease Mother   . Heart attack Father     No Known Allergies  Review of Systems  Constitutional: Negative.   HENT: Negative.    Eyes: Negative.   Respiratory: Negative.    Gastrointestinal: Negative.   Genitourinary: Negative.   Musculoskeletal: Negative.   Skin: Negative.   Neurological: Negative.   Endo/Heme/Allergies: Negative.  Psychiatric/Behavioral: Negative.    All other systems reviewed and are negative.      Objective:   BP 125/79   Pulse 79   Ht 5\' 4"  (1.626 m)   SpO2 97%   BMI 32.96 kg/m   Vitals:   01/17/23 1255  BP: 125/79  Pulse: 79  Height: 5\' 4"  (1.626 m)  SpO2: 97%    Physical Exam Vitals reviewed.  Constitutional:      Appearance: Normal appearance. He is normal weight.  HENT:     Head: Normocephalic.     Nose: Nose normal.     Mouth/Throat:     Mouth: Mucous membranes are moist.  Eyes:     Pupils: Pupils are equal, round, and reactive to light.  Cardiovascular:     Rate and Rhythm: Normal rate and regular rhythm.     Pulses: Normal pulses.     Heart sounds:  Normal heart sounds.  Pulmonary:     Effort: Pulmonary effort is normal.  Abdominal:     General: Abdomen is flat. Bowel sounds are normal.  Musculoskeletal:        General: Normal range of motion.     Cervical back: Normal range of motion.     Right lower leg: 2+ Edema present.     Left lower leg: 2+ Edema present.  Skin:    General: Skin is warm.  Neurological:     General: No focal deficit present.     Mental Status: He is alert.  Psychiatric:        Mood and Affect: Mood normal.     No results found for any visits on 01/17/23.  Recent Results (from the past 2160 hour(Jayde Mcallister))  Comprehensive metabolic panel     Status: Abnormal   Collection Time: 01/14/23  9:11 AM  Result Value Ref Range   Glucose 97 70 - 99 mg/dL   BUN 13 8 - 27 mg/dL   Creatinine, Ser 1.61 (L) 0.76 - 1.27 mg/dL   eGFR 98 >09 UE/AVW/0.98   BUN/Creatinine Ratio 19 10 - 24   Sodium 139 134 - 144 mmol/L   Potassium 4.6 3.5 - 5.2 mmol/L   Chloride 102 96 - 106 mmol/L   CO2 25 20 - 29 mmol/L   Calcium 9.1 8.6 - 10.2 mg/dL   Total Protein 7.0 6.0 - 8.5 g/dL   Albumin 4.2 3.8 - 4.8 g/dL   Globulin, Total 2.8 1.5 - 4.5 g/dL   Bilirubin Total 0.2 0.0 - 1.2 mg/dL   Alkaline Phosphatase 91 44 - 121 IU/L   AST 14 0 - 40 IU/L   ALT 12 0 - 44 IU/L  Lipid panel     Status: None   Collection Time: 01/14/23  9:11 AM  Result Value Ref Range   Cholesterol, Total 138 100 - 199 mg/dL   Triglycerides 50 0 - 149 mg/dL   HDL 57 >11 mg/dL   VLDL Cholesterol Cal 11 5 - 40 mg/dL   LDL Chol Calc (NIH) 70 0 - 99 mg/dL   Chol/HDL Ratio 2.4 0.0 - 5.0 ratio    Comment:                                   T. Chol/HDL Ratio  Men  Women                               1/2 Avg.Risk  3.4    3.3                                   Avg.Risk  5.0    4.4                                2X Avg.Risk  9.6    7.1                                3X Avg.Risk 23.4   11.0   Hemoglobin A1c     Status: None    Collection Time: 01/14/23  9:11 AM  Result Value Ref Range   Hgb A1c MFr Bld 5.6 4.8 - 5.6 %    Comment:          Prediabetes: 5.7 - 6.4          Diabetes: >6.4          Glycemic control for adults with diabetes: <7.0    Est. average glucose Bld gHb Est-mCnc 114 mg/dL      Assessment & Plan:   Problem List Items Addressed This Visit       Endocrine   Diabetes mellitus without complication (HCC) - Primary   Relevant Orders   POCT CBG (Fasting - Glucose)    Return in about 3 months (around 04/18/2023) for fu with labs prior.   Total time spent: 40 minutes  Luna Fuse, MD  01/17/2023   This document may have been prepared by [facility] Voice Recognition software and as such may include unintentional dictation errors.

## 2023-01-18 DIAGNOSIS — N390 Urinary tract infection, site not specified: Secondary | ICD-10-CM | POA: Diagnosis not present

## 2023-01-18 NOTE — Progress Notes (Signed)
Patient daughter notified and verbalized understanding.

## 2023-01-20 LAB — URINE CULTURE

## 2023-01-24 ENCOUNTER — Encounter: Payer: Self-pay | Admitting: Cardiovascular Disease

## 2023-01-24 ENCOUNTER — Ambulatory Visit: Payer: Medicare PPO | Admitting: Cardiovascular Disease

## 2023-01-24 VITALS — BP 122/70 | HR 70 | Ht 64.0 in | Wt 193.0 lb

## 2023-01-24 DIAGNOSIS — I10 Essential (primary) hypertension: Secondary | ICD-10-CM

## 2023-01-24 DIAGNOSIS — Z955 Presence of coronary angioplasty implant and graft: Secondary | ICD-10-CM

## 2023-01-24 DIAGNOSIS — R0602 Shortness of breath: Secondary | ICD-10-CM

## 2023-01-24 DIAGNOSIS — E782 Mixed hyperlipidemia: Secondary | ICD-10-CM | POA: Diagnosis not present

## 2023-01-24 NOTE — Progress Notes (Signed)
Cardiology Office Note   Date:  01/24/2023   ID:  Shahin, Polczynski Jun 28, 1950, MRN [id number]  PCP:  Sherron Monday, MD  Cardiologist:  Adrian Blackwater, MD      History of Present Illness: Edward Thompson is a 72 y.o. male who presents for No chief complaint on file.   Doing well      Past Medical History:  Diagnosis Date   CAD (coronary artery disease)    Carotid stenosis, left 08/2017   Diabetes mellitus without complication (HCC)    Foot drop    Fracture of neck (HCC) 2008   fell off a roof. required halo x 4 months. also fractured alot of vertebrae   Heart attack (HCC) 2015   Heart disease    Hepatitis    6th grade    Hyperlipidemia    Hypertension    Myocardial infarction acute (HCC) 2015   Seizures (HCC)    taking phenobarbitol and dilantin. LAST SEIZURE WAS 2016. well controlled on meds   Sleep apnea    USES CPAP   Syncope 2019   Vertigo    Viral meningitis      Past Surgical History:  Procedure Laterality Date   CARDIAC SURGERY     COLONOSCOPY     COLONOSCOPY WITH PROPOFOL N/A 03/11/2021   Procedure: COLONOSCOPY WITH PROPOFOL;  Surgeon: Toledo, Boykin Nearing, MD;  Location: [facility];  Service: Gastroenterology;  Laterality: N/A;   CORONARY ANGIOPLASTY     PATIENT UNAWARE OF THIS   CORONARY ARTERY BYPASS GRAFT  2015   ENDARTERECTOMY Left 08/19/2017   Procedure: ENDARTERECTOMY CAROTID;  Surgeon: Renford Dills, MD;  Location: [facility];  Service: Vascular;  Laterality: Left;   ESOPHAGOGASTRODUODENOSCOPY N/A 03/11/2021   Procedure: ESOPHAGOGASTRODUODENOSCOPY (EGD);  Surgeon: Toledo, Boykin Nearing, MD;  Location: [facility];  Service: Gastroenterology;  Laterality: N/A;  DM   HERNIA REPAIR Right 1985   inguinal     Current Outpatient Medications  Medication Sig Dispense Refill   Accu-Chek Softclix Lancets lancets TEST BLOOD SUGAR THREE TIMES DAILY 300 each 11   acetaminophen (TYLENOL) 500 MG tablet Take 500 mg by mouth every 6 (six)  hours as needed.     amLODipine (NORVASC) 5 MG tablet Take 0.5 tablets (2.5 mg total) by mouth daily.     aspirin EC 81 MG tablet Take 81 mg by mouth at bedtime.     Blood Glucose Monitoring Suppl (ACCU-CHEK GUIDE) w/Device KIT      ciprofloxacin (CIPRO) 500 MG tablet Take 1 tablet (500 mg total) by mouth 2 (two) times daily for 7 days. 14 tablet 0   clopidogrel (PLAVIX) 75 MG tablet Take 1 tablet by mouth once daily 90 tablet 0   escitalopram (LEXAPRO) 10 MG tablet TAKE 1 TABLET EVERY MORNING 90 tablet 3   feeding supplement, GLUCERNA SHAKE, (GLUCERNA SHAKE) LIQD Take 237 mLs by mouth 2 (two) times daily between meals. 1000 mL 12   furosemide (LASIX) 20 MG tablet Take 1 tablet (20 mg total) by mouth daily. 30 tablet 11   glucose blood (ACCU-CHEK GUIDE) test strip USE WITH DIABETIC METER TO TEST BLOOD SUGAR THREE TIMES DAILY 300 strip 3   isosorbide mononitrate (IMDUR) 30 MG 24 hr tablet Take 1 tablet (30 mg total) by mouth daily. 90 tablet 3   latanoprost (XALATAN) 0.005 % ophthalmic solution 1 drop at bedtime.     meclizine (ANTIVERT) 25 MG tablet TAKE 1 TABLET FOUR TIMES DAILY 360 tablet  3   metFORMIN (GLUCOPHAGE) 500 MG tablet TAKE 1 TABLET EVERY MORNING 90 tablet 3   metFORMIN (GLUCOPHAGE) 850 MG tablet Take by mouth.     metoprolol succinate (TOPROL-XL) 25 MG 24 hr tablet TAKE 1 TABLET EVERY DAY 90 tablet 3   Multiple Vitamin (MULTIVITAMIN) tablet Take 1 tablet by mouth daily.     nitroGLYCERIN (NITROSTAT) 0.4 MG SL tablet Place 0.4 mg under the tongue every 5 (five) minutes as needed for chest pain.     pantoprazole (PROTONIX) 40 MG tablet TAKE 1 TABLET EVERY MORNING 90 tablet 3   PHENobarbital (LUMINAL) 64.8 MG tablet TAKE 2 TABLETS AT BEDTIME 180 tablet 1   phenytoin (DILANTIN) 100 MG ER capsule TAKE 3 CAPSULES AT BEDTIME 180 capsule 5   Pollen Extracts (PROSTAT PO) Take 30 mLs by mouth daily.     polyethylene glycol (MIRALAX / GLYCOLAX) 17 g packet Take 17 g by mouth daily. 14 each 0    rosuvastatin (CRESTOR) 40 MG tablet TAKE 1 TABLET AT BEDTIME 90 tablet 3   senna-docusate (SENOKOT-S) 8.6-50 MG tablet Take 1 tablet by mouth at bedtime.     No current facility-administered medications for this visit.    Allergies:   Patient has no known allergies.    Social History:   reports that he has never smoked. He has never used smokeless tobacco. He reports that he does not drink alcohol and does not use drugs.   Family History:  family history includes Heart attack in his father; Heart disease in his mother.    ROS:     Review of Systems  Constitutional: Negative.   HENT: Negative.    Eyes: Negative.   Respiratory: Negative.    Gastrointestinal: Negative.   Genitourinary: Negative.   Musculoskeletal: Negative.   Skin: Negative.   Neurological: Negative.   Endo/Heme/Allergies: Negative.   Psychiatric/Behavioral: Negative.    All other systems reviewed and are negative.     All other systems are reviewed and negative.    PHYSICAL EXAM: VS:  BP 122/70   Pulse 70   Ht 5\' 4"  (1.626 m)   Wt 193 lb (87.5 kg)   SpO2 97%   BMI 33.13 kg/m  , BMI Body mass index is 33.13 kg/m. Last weight:  Wt Readings from Last 3 Encounters:  01/24/23 193 lb (87.5 kg)  01/17/23 192 lb (87.1 kg)  12/16/22 192 lb (87.1 kg)     Physical Exam Vitals reviewed.  Constitutional:      Appearance: Normal appearance. He is normal weight.  HENT:     Head: Normocephalic.     Nose: Nose normal.     Mouth/Throat:     Mouth: Mucous membranes are moist.  Eyes:     Pupils: Pupils are equal, round, and reactive to light.  Cardiovascular:     Rate and Rhythm: Normal rate and regular rhythm.     Pulses: Normal pulses.     Heart sounds: Normal heart sounds.  Pulmonary:     Effort: Pulmonary effort is normal.  Abdominal:     General: Abdomen is flat. Bowel sounds are normal.  Musculoskeletal:        General: Normal range of motion.     Cervical back: Normal range of motion.   Skin:    General: Skin is warm.  Neurological:     General: No focal deficit present.     Mental Status: He is alert.  Psychiatric:        Mood  and Affect: Mood normal.       EKG:   Recent Labs: 01/14/2023: ALT 12; BUN 13; Creatinine, Ser 0.69; Potassium 4.6; Sodium 139    Lipid Panel    Component Value Date/Time   CHOL 138 01/14/2023 0911   CHOL 117 09/23/2013 0132   TRIG 50 01/14/2023 0911   TRIG 64 09/23/2013 0132   HDL 57 01/14/2023 0911   HDL 49 09/23/2013 0132   CHOLHDL 2.4 01/14/2023 0911   CHOLHDL 3.5 02/06/2016 0419   VLDL 28 02/06/2016 0419   VLDL 13 09/23/2013 0132   LDLCALC 70 01/14/2023 0911   LDLCALC 55 09/23/2013 0132      Other studies Reviewed: Additional studies/ records that were reviewed today include:  Review of the above records demonstrates:       No data to display            ASSESSMENT AND PLAN:    ICD-10-CM   1. CAD s/p coronary artery stent placement  Z95.5     2. Mixed hyperlipidemia  E78.2     3. Essential hypertension  I10     4. Shortness of breath  R06.02    Feeling better with lasix 20 daily    5. Primary hypertension  I10        Problem List Items Addressed This Visit       Cardiovascular and Mediastinum   Essential hypertension     Other   Hyperlipidemia   CAD s/p coronary artery stent placement - Primary   Other Visit Diagnoses     Shortness of breath       Feeling better with lasix 20 daily   Primary hypertension              Disposition:   Return in about 2 months (around 03/26/2023).    Total time spent: 30 minutes  Signed,  Adrian Blackwater, MD  01/24/2023 2:06 PM    [facility]

## 2023-02-07 DIAGNOSIS — I252 Old myocardial infarction: Secondary | ICD-10-CM | POA: Diagnosis not present

## 2023-02-07 DIAGNOSIS — M4802 Spinal stenosis, cervical region: Secondary | ICD-10-CM | POA: Diagnosis not present

## 2023-02-07 DIAGNOSIS — G40909 Epilepsy, unspecified, not intractable, without status epilepticus: Secondary | ICD-10-CM | POA: Diagnosis not present

## 2023-02-07 DIAGNOSIS — I2510 Atherosclerotic heart disease of native coronary artery without angina pectoris: Secondary | ICD-10-CM | POA: Diagnosis not present

## 2023-02-07 DIAGNOSIS — G629 Polyneuropathy, unspecified: Secondary | ICD-10-CM | POA: Diagnosis not present

## 2023-02-07 DIAGNOSIS — G4733 Obstructive sleep apnea (adult) (pediatric): Secondary | ICD-10-CM | POA: Diagnosis not present

## 2023-02-07 DIAGNOSIS — M542 Cervicalgia: Secondary | ICD-10-CM | POA: Diagnosis not present

## 2023-02-07 DIAGNOSIS — R531 Weakness: Secondary | ICD-10-CM | POA: Diagnosis not present

## 2023-02-07 DIAGNOSIS — Z8719 Personal history of other diseases of the digestive system: Secondary | ICD-10-CM | POA: Diagnosis not present

## 2023-02-08 ENCOUNTER — Inpatient Hospital Stay
Admission: EM | Admit: 2023-02-08 | Discharge: 2023-02-11 | DRG: 101 | Disposition: A | Discharge status: Home Health | Payer: Medicare PPO | Attending: Osteopathic Medicine | Admitting: Osteopathic Medicine

## 2023-02-08 ENCOUNTER — Other Ambulatory Visit: Payer: Self-pay

## 2023-02-08 ENCOUNTER — Emergency Department: Payer: Medicare PPO

## 2023-02-08 DIAGNOSIS — Y92009 Unspecified place in unspecified non-institutional (private) residence as the place of occurrence of the external cause: Secondary | ICD-10-CM | POA: Diagnosis not present

## 2023-02-08 DIAGNOSIS — Z8249 Family history of ischemic heart disease and other diseases of the circulatory system: Secondary | ICD-10-CM

## 2023-02-08 DIAGNOSIS — T50995A Adverse effect of other drugs, medicaments and biological substances, initial encounter: Secondary | ICD-10-CM | POA: Diagnosis present

## 2023-02-08 DIAGNOSIS — Z955 Presence of coronary angioplasty implant and graft: Secondary | ICD-10-CM

## 2023-02-08 DIAGNOSIS — R609 Edema, unspecified: Secondary | ICD-10-CM | POA: Diagnosis not present

## 2023-02-08 DIAGNOSIS — D649 Anemia, unspecified: Secondary | ICD-10-CM | POA: Diagnosis present

## 2023-02-08 DIAGNOSIS — G4733 Obstructive sleep apnea (adult) (pediatric): Secondary | ICD-10-CM | POA: Diagnosis present

## 2023-02-08 DIAGNOSIS — N319 Neuromuscular dysfunction of bladder, unspecified: Secondary | ICD-10-CM | POA: Diagnosis present

## 2023-02-08 DIAGNOSIS — E119 Type 2 diabetes mellitus without complications: Secondary | ICD-10-CM | POA: Diagnosis present

## 2023-02-08 DIAGNOSIS — R29898 Other symptoms and signs involving the musculoskeletal system: Secondary | ICD-10-CM | POA: Diagnosis not present

## 2023-02-08 DIAGNOSIS — I252 Old myocardial infarction: Secondary | ICD-10-CM

## 2023-02-08 DIAGNOSIS — Z6833 Body mass index (BMI) 33.0-33.9, adult: Secondary | ICD-10-CM | POA: Diagnosis not present

## 2023-02-08 DIAGNOSIS — I447 Left bundle-branch block, unspecified: Secondary | ICD-10-CM | POA: Diagnosis present

## 2023-02-08 DIAGNOSIS — Z951 Presence of aortocoronary bypass graft: Secondary | ICD-10-CM | POA: Diagnosis not present

## 2023-02-08 DIAGNOSIS — G709 Myoneural disorder, unspecified: Secondary | ICD-10-CM

## 2023-02-08 DIAGNOSIS — M79604 Pain in right leg: Secondary | ICD-10-CM | POA: Diagnosis not present

## 2023-02-08 DIAGNOSIS — Z7902 Long term (current) use of antithrombotics/antiplatelets: Secondary | ICD-10-CM

## 2023-02-08 DIAGNOSIS — I6782 Cerebral ischemia: Secondary | ICD-10-CM | POA: Diagnosis not present

## 2023-02-08 DIAGNOSIS — R296 Repeated falls: Secondary | ICD-10-CM | POA: Diagnosis present

## 2023-02-08 DIAGNOSIS — S62607A Fracture of unspecified phalanx of left little finger, initial encounter for closed fracture: Secondary | ICD-10-CM | POA: Diagnosis not present

## 2023-02-08 DIAGNOSIS — T4275XA Adverse effect of unspecified antiepileptic and sedative-hypnotic drugs, initial encounter: Secondary | ICD-10-CM | POA: Diagnosis not present

## 2023-02-08 DIAGNOSIS — W19XXXA Unspecified fall, initial encounter: Secondary | ICD-10-CM | POA: Diagnosis present

## 2023-02-08 DIAGNOSIS — Z7984 Long term (current) use of oral hypoglycemic drugs: Secondary | ICD-10-CM | POA: Diagnosis not present

## 2023-02-08 DIAGNOSIS — E669 Obesity, unspecified: Secondary | ICD-10-CM | POA: Diagnosis present

## 2023-02-08 DIAGNOSIS — R55 Syncope and collapse: Secondary | ICD-10-CM | POA: Diagnosis not present

## 2023-02-08 DIAGNOSIS — R4182 Altered mental status, unspecified: Principal | ICD-10-CM | POA: Diagnosis present

## 2023-02-08 DIAGNOSIS — Z8619 Personal history of other infectious and parasitic diseases: Secondary | ICD-10-CM | POA: Diagnosis not present

## 2023-02-08 DIAGNOSIS — E785 Hyperlipidemia, unspecified: Secondary | ICD-10-CM | POA: Diagnosis present

## 2023-02-08 DIAGNOSIS — I10 Essential (primary) hypertension: Secondary | ICD-10-CM | POA: Diagnosis present

## 2023-02-08 DIAGNOSIS — M79662 Pain in left lower leg: Secondary | ICD-10-CM | POA: Diagnosis not present

## 2023-02-08 DIAGNOSIS — M25552 Pain in left hip: Secondary | ICD-10-CM | POA: Diagnosis not present

## 2023-02-08 DIAGNOSIS — Z043 Encounter for examination and observation following other accident: Secondary | ICD-10-CM | POA: Diagnosis not present

## 2023-02-08 DIAGNOSIS — R464 Slowness and poor responsiveness: Secondary | ICD-10-CM | POA: Diagnosis not present

## 2023-02-08 DIAGNOSIS — Z79899 Other long term (current) drug therapy: Secondary | ICD-10-CM

## 2023-02-08 DIAGNOSIS — G40909 Epilepsy, unspecified, not intractable, without status epilepticus: Secondary | ICD-10-CM | POA: Diagnosis present

## 2023-02-08 DIAGNOSIS — I2510 Atherosclerotic heart disease of native coronary artery without angina pectoris: Secondary | ICD-10-CM | POA: Diagnosis present

## 2023-02-08 DIAGNOSIS — R531 Weakness: Secondary | ICD-10-CM | POA: Diagnosis not present

## 2023-02-08 DIAGNOSIS — Z993 Dependence on wheelchair: Secondary | ICD-10-CM

## 2023-02-08 DIAGNOSIS — M79605 Pain in left leg: Secondary | ICD-10-CM | POA: Diagnosis not present

## 2023-02-08 DIAGNOSIS — R410 Disorientation, unspecified: Secondary | ICD-10-CM | POA: Diagnosis not present

## 2023-02-08 DIAGNOSIS — T50905A Adverse effect of unspecified drugs, medicaments and biological substances, initial encounter: Secondary | ICD-10-CM | POA: Diagnosis not present

## 2023-02-08 DIAGNOSIS — E1140 Type 2 diabetes mellitus with diabetic neuropathy, unspecified: Secondary | ICD-10-CM | POA: Diagnosis not present

## 2023-02-08 DIAGNOSIS — R9089 Other abnormal findings on diagnostic imaging of central nervous system: Secondary | ICD-10-CM | POA: Diagnosis not present

## 2023-02-08 DIAGNOSIS — S79912A Unspecified injury of left hip, initial encounter: Secondary | ICD-10-CM | POA: Diagnosis not present

## 2023-02-08 DIAGNOSIS — S8992XA Unspecified injury of left lower leg, initial encounter: Secondary | ICD-10-CM | POA: Diagnosis not present

## 2023-02-08 DIAGNOSIS — M79661 Pain in right lower leg: Secondary | ICD-10-CM | POA: Diagnosis not present

## 2023-02-08 LAB — URINALYSIS, ROUTINE W REFLEX MICROSCOPIC
Bilirubin Urine: NEGATIVE
Glucose, UA: NEGATIVE mg/dL
Hgb urine dipstick: NEGATIVE
Ketones, ur: NEGATIVE mg/dL
Leukocytes,Ua: NEGATIVE
Nitrite: NEGATIVE
Protein, ur: NEGATIVE mg/dL
Specific Gravity, Urine: 1.016 (ref 1.005–1.030)
pH: 7 (ref 5.0–8.0)

## 2023-02-08 LAB — BASIC METABOLIC PANEL
Anion gap: 11 (ref 5–15)
BUN: 18 mg/dL (ref 8–23)
CO2: 25 mmol/L (ref 22–32)
Calcium: 8.9 mg/dL (ref 8.9–10.3)
Chloride: 103 mmol/L (ref 98–111)
Creatinine, Ser: 0.67 mg/dL (ref 0.61–1.24)
GFR, Estimated: >60 mL/min (ref >60)
Glucose, Bld: 116 mg/dL — ABNORMAL HIGH (ref 70–99)
Potassium: 3.7 mmol/L (ref 3.5–5.1)
Sodium: 139 mmol/L (ref 135–145)

## 2023-02-08 LAB — CBC
HCT: 36.1 % — ABNORMAL LOW (ref 39.0–52.0)
Hemoglobin: 11.8 g/dL — ABNORMAL LOW (ref 13.0–17.0)
MCH: 30.8 pg (ref 26.0–34.0)
MCHC: 32.7 g/dL (ref 30.0–36.0)
MCV: 94.3 fL (ref 80.0–100.0)
Platelets: 165 10*3/uL (ref 150–400)
RBC: 3.83 MIL/uL — ABNORMAL LOW (ref 4.22–5.81)
RDW: 14 % (ref 11.5–15.5)
WBC: 9.3 10*3/uL (ref 4.0–10.5)
nRBC: 0 % (ref 0.0–0.2)

## 2023-02-08 LAB — CK: Total CK: 110 U/L (ref 49–397)

## 2023-02-08 LAB — TROPONIN I (HIGH SENSITIVITY): Troponin I (High Sensitivity): <2 ng/L (ref <18)

## 2023-02-08 MED ORDER — ACETAMINOPHEN 500 MG PO TABS
1000.0000 mg | ORAL_TABLET | Freq: Once | ORAL | Status: AC
  Administered 2023-02-08: 1000 mg via ORAL
  Filled 2023-02-08: qty 2

## 2023-02-08 NOTE — ED Triage Notes (Signed)
Pt comes via EMS from home with c/o multiple falls. Pt states he fell out of bed. Family also reported to EMs that pt was more sluggish. Pt is A*OX3. VSS [id number]   Pt also has bilateral foot swelling noted.

## 2023-02-08 NOTE — ED Notes (Signed)
PT daughter at bedside states the pt normally self-cath's and is unsure when the last time the pt performed self-cath. Offered pt for RN to straight cath, pt nodded in agreement.

## 2023-02-08 NOTE — ED Notes (Signed)
 Provided urine incontinence care. Replaced bed linen, bed pads, pt gown, and blankets. Pt repositioned, side rails up x2,  and call light placed back within reach. No further needs identified at this time.

## 2023-02-08 NOTE — ED Provider Notes (Signed)
[facility] Provider Note    Event Date/Time   First MD Initiated Contact with Patient 02/08/23 1637     (approximate)   History   Fall and Weakness   HPI  Edward Thompson is a 72 y.o. male past medical history significant for CAD, diabetes, seizure disorder, hypertension, neurogenic bladder who does self-catheterization, presents to the emergency department following a fall.  History is provided by the patient and son at bedside.  Patient lives with son and daughter-in-law with their children.  Found him laying next to his automatic recliner on the ground.  Unknown downtime.  States that he initially was not responsive, he is uncertain if this is his normal state or not.  States that he normally keeps to himself.  Denies any urinary or bowel incontinence.  No tongue biting.  Denies any chest pain or shortness of breath.  Denies any burning with urination.  Questionable head injury.  Not on anticoagulation.  Currently being worked up at [facility], has a history of being wheelchair-bound, states that he got a bunch of genetic testing done yesterday.   On chart review patient was evaluated yesterday, and [facility] neuromuscular clinic.  Abnormal EMG done as an outpatient with Dr. Malvin Johns.  History of disorders on Dilantin and phenobarbital.     Physical Exam   Triage Vital Signs: ED Triage Vitals  Encounter Vitals Group     BP 02/08/23 1442 (!) 153/116     Systolic BP Percentile --      Diastolic BP Percentile --      Pulse Rate 02/08/23 1442 80     Resp 02/08/23 1442 19     Temp 02/08/23 1442 98 F (36.7 C)     Temp src --      SpO2 02/08/23 1442 100 %     Weight --      Height --      Head Circumference --      Peak Flow --      Pain Score 02/08/23 1440 3     Pain Loc --      Pain Education --      Exclude from Growth Chart --     Most recent vital signs: Vitals:   02/08/23 1855 02/08/23 1905  BP:    Pulse:  75  Resp:  13  Temp: 97.6 F (36.4 C)    SpO2:  98%    Physical Exam Constitutional:      Appearance: He is well-developed.  HENT:     Head: Atraumatic.  Eyes:     Conjunctiva/sclera: Conjunctivae normal.     Pupils: Pupils are equal, round, and reactive to light.  Cardiovascular:     Rate and Rhythm: Regular rhythm.  Pulmonary:     Effort: No respiratory distress.  Musculoskeletal:        General: Normal range of motion.     Cervical back: Normal range of motion.     Right lower leg: Edema present.     Left lower leg: Edema present.     Comments: Pitting edema to bilateral lower extremities.  Mild tenderness palpation to the left hip.  No open wounds.  No tenderness to the cervical, thoracic or lumbar spine.  Tenderness and ecchymosis to the left third finger.  +2 radial pulses that are equal bilaterally.  +2 DP pulses.  No unilateral leg swelling.  Skin:    General: Skin is warm.  Neurological:     Mental Status: He  is alert. Mental status is at baseline.     GCS: GCS eye subscore is 4. GCS verbal subscore is 5. GCS motor subscore is 6.     Cranial Nerves: Cranial nerves 2-12 are intact.     Sensory: Sensation is intact.     Motor: Motor function is intact.     Coordination: Coordination is intact.     Comments: Deferred gait given he is wheelchair-bound at baseline     IMPRESSION / MDM / ASSESSMENT AND PLAN / ED COURSE  I reviewed the triage vital signs and the nursing notes.  Differential diagnosis including intracranial hemorrhage, infarction, rhabdomyolysis, dehydration, urinary tract infection, electrolyte abnormality, seizure, ACS, dysrhythmia  EKG  I, Corena Herter, the attending physician, personally viewed and interpreted this ECG.  Significant artifact.  Unable to discern a PR interval.  No significant ST elevation or depression.  No findings of acute ischemia or dysrhythmia.  No tachycardic or bradycardic dysrhythmias while on cardiac telemetry.  RADIOLOGY I independently reviewed imaging, my  interpretation of imaging: CT scan of the head without signs of intracranial hemorrhage.  CT scan of the cervical spine without obvious fracture or dislocation.  Read as no acute findings.  X-ray of the left hip and lower leg, x-ray of the left hand -no obvious fracture or dislocation.  X-ray still has not been read formally by radiologist.  LABS (all labs ordered are listed, but only abnormal results are displayed) Labs interpreted as -    Labs Reviewed  BASIC METABOLIC PANEL - Abnormal; Notable for the following components:      Result Value   Glucose, Bld 116 (*)    All other components within normal limits  CBC - Abnormal; Notable for the following components:   RBC 3.83 (*)    Hemoglobin 11.8 (*)    HCT 36.1 (*)    All other components within normal limits  URINALYSIS, ROUTINE W REFLEX MICROSCOPIC - Abnormal; Notable for the following components:   Color, Urine YELLOW (*)    APPearance CLEAR (*)    All other components within normal limits  CK  PHENYTOIN LEVEL, FREE AND TOTAL  PHENOBARBITAL LEVEL  TROPONIN I (HIGH SENSITIVITY)     MDM  No obvious leukocytosis.  Hemoglobin with anemia but is stable and at baseline.  No significant electrolyte abnormalities.  No signs of rhabdomyolysis.  No findings of urinary tract infection.  CT scan of the head without signs of intracranial hemorrhage or infarction.  Clinical picture is not consistent with CVA, has a nonfocal neurologic exam.  No findings of fracture or dislocation.  Given Tylenol for pain control.  Patient questionably had a seizure.  Is followed as an outpatient with neurology and is on antiepileptic medication.   Low suspicion for ACS, troponin is negative.  EKG without findings of acute ischemia or dysrhythmia.  UA without signs of urinary tract infection.  On reevaluation continued to have altered mental status and according to family members at bedside states that this is not his normal mental status and that he has  had worsening altered mental status for the past 2 to 3 days.  Unable to get him out of the car and he has had frequent falls over the past 2 to 3 days.  Possible postconcussive syndrome, possible postictal state, given that the patient is not back to baseline consulted hospitalist for admission for altered mental status.  They did state that they do have a goal of getting him back home and  they do not want placement at this time.  Continues to have a nonfocal exam, have low suspicion for acute CVA.     PROCEDURES:  Critical Care performed: No  Procedures  Patient's presentation is most consistent with acute presentation with potential threat to life or bodily function.   MEDICATIONS ORDERED IN ED: Medications  acetaminophen (TYLENOL) tablet 1,000 mg (1,000 mg Oral Given 02/08/23 1835)    FINAL CLINICAL IMPRESSION(S) / ED DIAGNOSES   Final diagnoses:  Altered mental status, unspecified altered mental status type     Rx / DC Orders   ED Discharge Orders     None        Note:  This document was prepared using Dragon voice recognition software and may include unintentional dictation errors.   Corena Herter, MD 02/08/23 2036

## 2023-02-08 NOTE — H&P (Incomplete)
History and Physical    Patient: Edward Thompson DOB: March 13, 1951 DOA: 02/08/2023 DOS: the patient was seen and examined on 02/08/2023 PCP: Sherron Monday, MD  Patient coming from: Home  Chief Complaint:  Chief Complaint  Patient presents with   Fall   Weakness    HPI: Edward Thompson is a 72 y.o. male with medical history significant for CAD s/p stent, seizure disorder, HTN, DM, progressive genetic neuromuscular disease x 6 years being worked up at [facility], now wheelchair-bound  with neurogenic bladder self catheterizing x 2 years with history of ESBL E. 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  Patient noted by family to be confused.  He had no evidence of biting of his tongue or loss of bladder or bowel control.  He was previously in his usual state of health and had an appointment the day prior at the [facility]. ED course and data review: BP 153/116 with otherwise normal vitals Labs including CBC, BMP, troponin and urinalysis all unremarkable except for mild anemia of 11.8. CK 110 Phenytoin and phenobarb level pending EKG, personally viewed and interpreted showed sinus at 79 with LBBB Trauma workup was done and included CT head C-spine, left hand, left tib-fib and pelvis and left hip all negative for acute injury Observation requested for possible breakthrough seizure    Past Medical History:  Diagnosis Date   CAD (coronary artery disease)    Carotid stenosis, left 08/2017   Diabetes mellitus without complication (HCC)    Foot drop    Fracture of neck (HCC) 2008   fell off a roof. required halo x 4 months. also fractured alot of vertebrae   Heart attack (HCC) 2015   Heart disease    Hepatitis    6th grade    Hyperlipidemia    Hypertension     Myocardial infarction acute (HCC) 2015   Seizures (HCC)    taking phenobarbitol and dilantin. LAST SEIZURE WAS 2016. well controlled on meds   Sleep apnea    USES CPAP   Syncope 2019   Vertigo    Viral meningitis    Past Surgical History:  Procedure Laterality Date   CARDIAC SURGERY     COLONOSCOPY     COLONOSCOPY WITH PROPOFOL N/A 03/11/2021   Procedure: COLONOSCOPY WITH PROPOFOL;  Surgeon: Toledo, Boykin Nearing, MD;  Location: [facility] ENDOSCOPY;  Service: Gastroenterology;  Laterality: N/A;   CORONARY ANGIOPLASTY     PATIENT UNAWARE OF THIS   CORONARY ARTERY BYPASS GRAFT  2015   ENDARTERECTOMY Left 08/19/2017   Procedure: ENDARTERECTOMY CAROTID;  Surgeon: Renford Dills, MD;  Location: [facility] ORS;  Service: Vascular;  Laterality: Left;   ESOPHAGOGASTRODUODENOSCOPY N/A 03/11/2021   Procedure: ESOPHAGOGASTRODUODENOSCOPY (EGD);  Surgeon: Toledo, Boykin Nearing, MD;  Location: [facility] ENDOSCOPY;  Service: Gastroenterology;  Laterality: N/A;  DM   HERNIA REPAIR Right 1985   inguinal   Social History:  reports that he has never smoked. He has never used smokeless tobacco. He reports that he does not drink alcohol and does not use drugs.  No Known Allergies  Family History  Problem Relation Age of Onset   Heart disease Mother    Heart attack Father     Prior to Admission medications   Medication Sig Start  Date End Date Taking? Authorizing Provider  Accu-Chek Softclix Lancets lancets TEST BLOOD SUGAR THREE TIMES DAILY 08/27/22   Margaretann Loveless, MD  acetaminophen (TYLENOL) 500 MG tablet Take 500 mg by mouth every 6 (six) hours as needed.    [provider]  amLODipine (NORVASC) 5 MG tablet Take 0.5 tablets (2.5 mg total) by mouth daily. 08/10/21   Lynn Ito, MD  aspirin EC 81 MG tablet Take 81 mg by mouth at bedtime.    [provider]  Blood Glucose Monitoring Suppl (ACCU-CHEK GUIDE) w/Device KIT  04/24/21   [provider]  clopidogrel (PLAVIX) 75 MG tablet Take 1  tablet by mouth once daily 01/11/23   Laurier Nancy, MD  escitalopram (LEXAPRO) 10 MG tablet TAKE 1 TABLET EVERY MORNING 08/27/22   Margaretann Loveless, MD  feeding supplement, GLUCERNA SHAKE, (GLUCERNA SHAKE) LIQD Take 237 mLs by mouth 2 (two) times daily between meals. 12/13/20   Arnetha Courser, MD  furosemide (LASIX) 20 MG tablet Take 1 tablet (20 mg total) by mouth daily. 12/16/22 12/16/23  Adrian Blackwater A, MD  glucose blood (ACCU-CHEK GUIDE) test strip USE WITH DIABETIC METER TO TEST BLOOD SUGAR THREE TIMES DAILY 08/27/22   Margaretann Loveless, MD  isosorbide mononitrate (IMDUR) 30 MG 24 hr tablet Take 1 tablet (30 mg total) by mouth daily. 08/12/22   Laurier Nancy, MD  latanoprost (XALATAN) 0.005 % ophthalmic solution 1 drop at bedtime. 07/06/22   [provider]  meclizine (ANTIVERT) 25 MG tablet TAKE 1 TABLET FOUR TIMES DAILY 01/07/23   Sherron Monday, MD  metFORMIN (GLUCOPHAGE) 500 MG tablet TAKE 1 TABLET EVERY MORNING 01/07/23   Sherron Monday, MD  metFORMIN (GLUCOPHAGE) 850 MG tablet Take by mouth.    [provider]  metoprolol succinate (TOPROL-XL) 25 MG 24 hr tablet TAKE 1 TABLET EVERY DAY 08/27/22   Laurier Nancy, MD  Multiple Vitamin (MULTIVITAMIN) tablet Take 1 tablet by mouth daily.    [provider]  nitroGLYCERIN (NITROSTAT) 0.4 MG SL tablet Place 0.4 mg under the tongue every 5 (five) minutes as needed for chest pain. 08/22/13   [provider]  pantoprazole (PROTONIX) 40 MG tablet TAKE 1 TABLET EVERY MORNING 08/27/22   Margaretann Loveless, MD  PHENobarbital (LUMINAL) 64.8 MG tablet TAKE 2 TABLETS AT BEDTIME 01/07/23   Sherron Monday, MD  phenytoin (DILANTIN) 100 MG ER capsule TAKE 3 CAPSULES AT BEDTIME 08/27/22   Margaretann Loveless, MD  Pollen Extracts (PROSTAT PO) Take 30 mLs by mouth daily.    [provider]  polyethylene glycol (MIRALAX / GLYCOLAX) 17 g packet Take 17 g by mouth daily. 10/11/20   Hollice Espy, MD  rosuvastatin (CRESTOR) 40 MG  tablet TAKE 1 TABLET AT BEDTIME 01/07/23   Sherron Monday, MD  senna-docusate (SENOKOT-S) 8.6-50 MG tablet Take 1 tablet by mouth at bedtime. 08/10/21   Lynn Ito, MD    Physical Exam: Vitals:   02/08/23 1905 02/08/23 2000 02/08/23 2100 02/08/23 2130  BP:    130/66  Pulse: 75 69 66 68  Resp: 13 12 13 18   Temp:    (!) 97.5 F (36.4 C)  TempSrc:    Oral  SpO2: 98% 99% 100% 100%  Weight:      Height:       Physical Exam Vitals and nursing note reviewed.  Constitutional:      General: He is sleeping. He is not in acute distress.  Comments: Patient grunts when awakened  HENT:     Head: Normocephalic and atraumatic.  Cardiovascular:     Rate and Rhythm: Normal rate and regular rhythm.     Heart sounds: Normal heart sounds.  Pulmonary:     Effort: Pulmonary effort is normal.     Breath sounds: Normal breath sounds.  Abdominal:     Palpations: Abdomen is soft.     Tenderness: There is no abdominal tenderness.  Neurological:     Mental Status: Mental status is at baseline. He is lethargic.     Labs on Admission: I have personally reviewed following labs and imaging studies  CBC: Recent Labs  Lab 02/08/23 1523  WBC 9.3  HGB 11.8*  HCT 36.1*  MCV 94.3  PLT 165   Basic Metabolic Panel: Recent Labs  Lab 02/08/23 1523  NA 139  K 3.7  CL 103  CO2 25  GLUCOSE 116*  BUN 18  CREATININE 0.67  CALCIUM 8.9   GFR: Estimated Creatinine Clearance: 83.2 mL/min (by C-G formula based on SCr of 0.67 mg/dL). Liver Function Tests: No results for input(s): "AST", "ALT", "ALKPHOS", "BILITOT", "PROT", "ALBUMIN" in the last 168 hours. No results for input(s): "LIPASE", "AMYLASE" in the last 168 hours. No results for input(s): "AMMONIA" in the last 168 hours. Coagulation Profile: No results for input(s): "INR", "PROTIME" in the last 168 hours. Cardiac Enzymes: Recent Labs  Lab 02/08/23 1520  CKTOTAL 110   BNP (last 3 results) No results for input(s): "PROBNP" in the  last 8760 hours. HbA1C: No results for input(s): "HGBA1C" in the last 72 hours. CBG: No results for input(s): "GLUCAP" in the last 168 hours. Lipid Profile: No results for input(s): "CHOL", "HDL", "LDLCALC", "TRIG", "CHOLHDL", "LDLDIRECT" in the last 72 hours. Thyroid Function Tests: No results for input(s): "TSH", "T4TOTAL", "FREET4", "T3FREE", "THYROIDAB" in the last 72 hours. Anemia Panel: No results for input(s): "VITAMINB12", "FOLATE", "FERRITIN", "TIBC", "IRON", "RETICCTPCT" in the last 72 hours. Urine analysis:    Component Value Date/Time   COLORURINE YELLOW (A) 02/08/2023 1758   APPEARANCEUR CLEAR (A) 02/08/2023 1758   APPEARANCEUR Clear 08/21/2021 1046   LABSPEC 1.016 02/08/2023 1758   PHURINE 7.0 02/08/2023 1758   GLUCOSEU NEGATIVE 02/08/2023 1758   HGBUR NEGATIVE 02/08/2023 1758   BILIRUBINUR NEGATIVE 02/08/2023 1758   BILIRUBINUR 1 01/17/2023 1522   BILIRUBINUR Negative 08/21/2021 1046   KETONESUR NEGATIVE 02/08/2023 1758   PROTEINUR NEGATIVE 02/08/2023 1758   UROBILINOGEN 0.2 01/17/2023 1522   NITRITE NEGATIVE 02/08/2023 1758   LEUKOCYTESUR NEGATIVE 02/08/2023 1758    Radiological Exams on Admission: DG Hip Unilat With Pelvis 2-3 Views Left  Result Date: 02/08/2023 CLINICAL DATA:  Fall and trauma to the left lower extremity and pain. EXAM: DG HIP (WITH OR WITHOUT PELVIS) 2-3V LEFT; LEFT TIBIA AND FIBULA - 2 VIEW COMPARISON:  None Available. FINDINGS: There is no acute fracture or dislocation. The bones are osteopenic. Mild arthritic changes of the hips and left knee. There is diffuse subcutaneous edema. No radiopaque foreign object or soft tissue gas. Vascular calcifications. IMPRESSION: 1. No acute fracture or dislocation. 2. Osteopenia. Electronically Signed   By: Elgie Collard M.D.   On: 02/08/2023 20:42   DG Hand Complete Left  Result Date: 02/08/2023 CLINICAL DATA:  Fall.  4th digit pain.  Bruising. EXAM: LEFT HAND - COMPLETE 3+ VIEW COMPARISON:  Left fifth  finger radiographs 10/05/2004 FINDINGS: There is diffuse decreased bone mineralization. 3 mm ulnar negative variance. Mild distal radioulnar joint  space narrowing and peripheral spurring. Minimal thumb carpometacarpal joint space narrowing and peripheral spurring. Moderate fifth DIP and mild second through fourth DIP joint space narrowing. Moderate to severe fifth PIP joint space narrowing and peripheral osteophytosis. Interval healing of the prior fracture at the base of the middle phalanx of fifth finger on remote 2006 radiographs. No acute fracture is seen.  No dislocation. IMPRESSION: 1. No acute fracture. 2. Moderate to severe fifth PIP and moderate fifth DIP osteoarthritis. Electronically Signed   By: Neita Garnet M.D.   On: 02/08/2023 18:48   CT Cervical Spine Wo Contrast  Result Date: 02/08/2023 CLINICAL DATA:  Fall EXAM: CT CERVICAL SPINE WITHOUT CONTRAST TECHNIQUE: Multidetector CT imaging of the cervical spine was performed without intravenous contrast. Multiplanar CT image reconstructions were also generated. RADIATION DOSE REDUCTION: This exam was performed according to the departmental dose-optimization program which includes automated exposure control, adjustment of the mA and/or kV according to patient size and/or use of iterative reconstruction technique. COMPARISON:  09/18/2021 FINDINGS: Alignment: No subluxation Skull base and vertebrae: No acute fracture. No primary bone lesion or focal pathologic process. Soft tissues and spinal canal: No prevertebral fluid or swelling. No visible canal hematoma. Disc levels: Maintained. Early degenerative facet disease bilaterally. No disc herniation. Upper chest: No acute findings Other: None IMPRESSION: No acute bony abnormality. Electronically Signed   By: Charlett Nose M.D.   On: 02/08/2023 17:06   CT HEAD WO CONTRAST ( )  Result Date: 02/08/2023 CLINICAL DATA:  Fall EXAM: CT HEAD WITHOUT CONTRAST TECHNIQUE: Contiguous axial images were obtained  from the base of the skull through the vertex without intravenous contrast. RADIATION DOSE REDUCTION: This exam was performed according to the departmental dose-optimization program which includes automated exposure control, adjustment of the mA and/or kV according to patient size and/or use of iterative reconstruction technique. COMPARISON:  01/15/2022 FINDINGS: Brain: There is atrophy and chronic small vessel disease changes. No acute intracranial abnormality. Specifically, no hemorrhage, hydrocephalus, mass lesion, acute infarction, or significant intracranial injury. Vascular: No hyperdense vessel or unexpected calcification. Skull: No acute calvarial abnormality. Sinuses/Orbits: No acute findings Other: None IMPRESSION: Atrophy, chronic microvascular disease. No acute intracranial abnormality. Electronically Signed   By: Charlett Nose M.D.   On: 02/08/2023 17:05     Data Reviewed: Relevant notes from primary care and specialist visits, past discharge summaries as available in EHR, including Care Everywhere. Prior diagnostic testing as pertinent to current admission diagnoses Updated medications and problem lists for reconciliation ED course, including vitals, labs, imaging, treatment and response to treatment Triage notes, nursing and pharmacy notes and ED provider's notes Notable results as noted in HPI   Assessment and Plan: * Altered mental status, unspecified Unwitnessed fall at home, syncope versus unwitnessed seizure with postictal state Genetic neuromuscular disorder being worked up at [facility] etiology differential includes postictal seizure Head CT was non-acute--will get MRI Will keep n.p.o. until closer to baseline Neurologic checks Aspiration precautions Continuous cardiac monitoring PT OT eval Neurology consulted  Seizure disorder (HCC) Possible breakthrough seizure, postictal Follow phenobarbital and phenytoin levels Continue home phenobarbital and  phenytoin Ativan as needed seizure Seizure precautions Will get EEG Neurology has been consulted  Neuromuscular disorder [facility]) Wheelchair dependent Followed at [facility], most recently seen 02/07/2023  Neurogenic bladder, with intermittent self catheterization Given lethargy, will order every 6 hour in and out catheterization  Essential hypertension Continue home antihypertensives, amlodipine metoprolol  DM (diabetes mellitus) (HCC) Sliding scale insulin coverage  CAD s/p coronary artery stent  placement Troponin normal, patient had no chest pain.  EKG showed LBBB Last saw his cardiologist Dr. Welton Flakes on 01/18/23 and was doing well Continue rosuvastatin, aspirin, metoprolol, isosorbide  OSA on CPAP CPAP nightly    DVT prophylaxis: Lovenox  Consults: Neurology  Advance Care Planning:   Code Status: Prior   Family Communication: none  Disposition Plan: Back to previous home environment  Severity of Illness: The appropriate patient status for this patient is INPATIENT. Inpatient status is judged to be reasonable and necessary in order to provide the required intensity of service to ensure the patient's safety. The patient's presenting symptoms, physical exam findings, and initial radiographic and laboratory data in the context of their chronic comorbidities is felt to place them at high risk for further clinical deterioration. Furthermore, it is not anticipated that the patient will be medically stable for discharge from the hospital within 2 midnights of admission.   * I certify that at the point of admission it is my clinical judgment that the patient will require inpatient hospital care spanning beyond 2 midnights from the point of admission due to high intensity of service, high risk for further deterioration and high frequency of surveillance required.* CRITICAL CARE Performed by: Andris Baumann   Total critical care time: 40 minutes  Critical care time was exclusive of  separately billable procedures and treating other patients.  Critical care was necessary to treat or prevent imminent or life-threatening deterioration.  Critical care was time spent personally by me on the following activities: development of treatment plan with patient and/or surrogate as well as nursing, discussions with consultants, evaluation of patient's response to treatment, examination of patient, obtaining history from patient or surrogate, ordering and performing treatments and interventions, ordering and review of laboratory studies, ordering and review of radiographic studies, pulse oximetry and re-evaluation of patient's condition.  Author: Andris Baumann, MD 02/08/2023 9:36 PM  For on call review [email or web address].

## 2023-02-09 ENCOUNTER — Ambulatory Visit: Payer: Medicare PPO

## 2023-02-09 ENCOUNTER — Observation Stay (HOSPITAL_COMMUNITY)
Admit: 2023-02-09 | Discharge: 2023-02-09 | Disposition: A | Discharge status: Home / Self Care | Payer: Medicare PPO | Attending: Internal Medicine | Admitting: Internal Medicine

## 2023-02-09 ENCOUNTER — Encounter: Payer: Self-pay | Admitting: Internal Medicine

## 2023-02-09 ENCOUNTER — Observation Stay: Payer: Medicare PPO

## 2023-02-09 DIAGNOSIS — R410 Disorientation, unspecified: Secondary | ICD-10-CM

## 2023-02-09 DIAGNOSIS — R464 Slowness and poor responsiveness: Secondary | ICD-10-CM

## 2023-02-09 DIAGNOSIS — I6782 Cerebral ischemia: Secondary | ICD-10-CM | POA: Diagnosis not present

## 2023-02-09 DIAGNOSIS — R55 Syncope and collapse: Secondary | ICD-10-CM | POA: Diagnosis not present

## 2023-02-09 DIAGNOSIS — T50905A Adverse effect of unspecified drugs, medicaments and biological substances, initial encounter: Secondary | ICD-10-CM

## 2023-02-09 DIAGNOSIS — R9089 Other abnormal findings on diagnostic imaging of central nervous system: Secondary | ICD-10-CM | POA: Diagnosis not present

## 2023-02-09 DIAGNOSIS — R4182 Altered mental status, unspecified: Secondary | ICD-10-CM | POA: Diagnosis not present

## 2023-02-09 DIAGNOSIS — T4275XA Adverse effect of unspecified antiepileptic and sedative-hypnotic drugs, initial encounter: Secondary | ICD-10-CM | POA: Diagnosis not present

## 2023-02-09 LAB — CBC
HCT: 37.8 % — ABNORMAL LOW (ref 39.0–52.0)
Hemoglobin: 12.3 g/dL — ABNORMAL LOW (ref 13.0–17.0)
MCH: 30.8 pg (ref 26.0–34.0)
MCHC: 32.5 g/dL (ref 30.0–36.0)
MCV: 94.5 fL (ref 80.0–100.0)
Platelets: 171 10*3/uL (ref 150–400)
RBC: 4 MIL/uL — ABNORMAL LOW (ref 4.22–5.81)
RDW: 13.7 % (ref 11.5–15.5)
WBC: 6.4 10*3/uL (ref 4.0–10.5)
nRBC: 0 % (ref 0.0–0.2)

## 2023-02-09 LAB — CREATININE, SERUM
Creatinine, Ser: 0.61 mg/dL (ref 0.61–1.24)
GFR, Estimated: >60 mL/min (ref >60)

## 2023-02-09 LAB — HEPATIC FUNCTION PANEL
ALT: 14 U/L (ref 0–44)
AST: 21 U/L (ref 15–41)
Albumin: 3.9 g/dL (ref 3.5–5.0)
Alkaline Phosphatase: 86 U/L (ref 38–126)
Bilirubin, Direct: 0.1 mg/dL (ref 0.0–0.2)
Indirect Bilirubin: 0.7 mg/dL (ref 0.3–0.9)
Total Bilirubin: 0.8 mg/dL (ref 0.3–1.2)
Total Protein: 7.6 g/dL (ref 6.5–8.1)

## 2023-02-09 LAB — CBG MONITORING, ED
Glucose-Capillary: 108 mg/dL — ABNORMAL HIGH (ref 70–99)
Glucose-Capillary: 120 mg/dL — ABNORMAL HIGH (ref 70–99)
Glucose-Capillary: 104 mg/dL — ABNORMAL HIGH (ref 70–99)
Glucose-Capillary: 98 mg/dL (ref 70–99)
Glucose-Capillary: 143 mg/dL — ABNORMAL HIGH (ref 70–99)
Glucose-Capillary: 161 mg/dL — ABNORMAL HIGH (ref 70–99)

## 2023-02-09 LAB — PROTIME-INR
INR: 1.2 (ref 0.8–1.2)
Prothrombin Time: 15.6 s — ABNORMAL HIGH (ref 11.4–15.2)

## 2023-02-09 LAB — AMMONIA: Ammonia: 11 umol/L (ref 9–35)

## 2023-02-09 LAB — PHENOBARBITAL LEVEL: Phenobarbital: 51.9 ug/mL — ABNORMAL HIGH (ref 15.0–40.0)

## 2023-02-09 LAB — PHENYTOIN LEVEL, TOTAL: Phenytoin Lvl: 28.1 ug/mL — ABNORMAL HIGH (ref 10.0–20.0)

## 2023-02-09 MED ORDER — ONDANSETRON HCL 4 MG/2ML IJ SOLN: 4.0000 mg | Freq: Four times a day (QID) | INTRAMUSCULAR | Status: DC | PRN

## 2023-02-09 MED ORDER — PANTOPRAZOLE SODIUM 40 MG PO TBEC
40.0000 mg | DELAYED_RELEASE_TABLET | Freq: Every morning | ORAL | Status: DC
  Administered 2023-02-09 – 2023-02-11 (×3): 40 mg via ORAL
  Filled 2023-02-09 (×3): qty 1

## 2023-02-09 MED ORDER — ACETAMINOPHEN 325 MG PO TABS: 650.0000 mg | ORAL_TABLET | ORAL | Status: DC | PRN

## 2023-02-09 MED ORDER — ASPIRIN 81 MG PO TBEC
81.0000 mg | DELAYED_RELEASE_TABLET | Freq: Every day | ORAL | Status: DC
  Administered 2023-02-10: 81 mg via ORAL
  Filled 2023-02-09: qty 1

## 2023-02-09 MED ORDER — ONDANSETRON HCL 4 MG PO TABS: 4.0000 mg | ORAL_TABLET | Freq: Four times a day (QID) | ORAL | Status: DC | PRN

## 2023-02-09 MED ORDER — HYDROCODONE-ACETAMINOPHEN 5-325 MG PO TABS: 1.0000 | ORAL_TABLET | ORAL | Status: DC | PRN

## 2023-02-09 MED ORDER — ENOXAPARIN SODIUM 60 MG/0.6ML IJ SOSY
45.0000 mg | PREFILLED_SYRINGE | INTRAMUSCULAR | Status: DC
  Administered 2023-02-09 – 2023-02-11 (×3): 45 mg via SUBCUTANEOUS
  Filled 2023-02-09 (×3): qty 0.6

## 2023-02-09 MED ORDER — ACETAMINOPHEN 325 MG RE SUPP: 650.0000 mg | RECTAL | Status: DC | PRN

## 2023-02-09 MED ORDER — PHENYTOIN SODIUM EXTENDED 100 MG PO CAPS: 300.0000 mg | ORAL_CAPSULE | Freq: Every day | ORAL | Status: DC

## 2023-02-09 MED ORDER — ACETAMINOPHEN 650 MG RE SUPP: 650.0000 mg | Freq: Four times a day (QID) | RECTAL | Status: DC | PRN

## 2023-02-09 MED ORDER — SODIUM CHLORIDE 0.9 % IV SOLN: INTRAVENOUS | Status: DC

## 2023-02-09 MED ORDER — ORAL CARE MOUTH RINSE: 15.0000 mL | OROMUCOSAL | Status: DC | PRN

## 2023-02-09 MED ORDER — PHENOBARBITAL 64.8 MG PO TABS: 129.6000 mg | ORAL_TABLET | Freq: Every day | ORAL | Status: DC

## 2023-02-09 MED ORDER — SODIUM CHLORIDE 0.9% FLUSH
3.0000 mL | Freq: Two times a day (BID) | INTRAVENOUS | Status: DC
  Administered 2023-02-09 – 2023-02-10 (×3): 3 mL via INTRAVENOUS

## 2023-02-09 MED ORDER — INSULIN ASPART 100 UNIT/ML IJ SOLN
0.0000 [IU] | INTRAMUSCULAR | Status: DC
  Administered 2023-02-09 – 2023-02-10 (×2): 2 [IU] via SUBCUTANEOUS
  Filled 2023-02-09: qty 1

## 2023-02-09 MED ORDER — ACETAMINOPHEN 325 MG PO TABS: 650.0000 mg | ORAL_TABLET | Freq: Four times a day (QID) | ORAL | Status: DC | PRN

## 2023-02-09 MED ORDER — NITROGLYCERIN 0.4 MG SL SUBL: 0.4000 mg | SUBLINGUAL_TABLET | SUBLINGUAL | Status: DC | PRN

## 2023-02-09 MED ORDER — SODIUM CHLORIDE 0.9 % IV SOLN: 75.0000 mL/h | INTRAVENOUS | Status: DC

## 2023-02-09 MED ORDER — ORAL CARE MOUTH RINSE
15.0000 mL | OROMUCOSAL | Status: DC
  Administered 2023-02-10: 15 mL via OROMUCOSAL
  Filled 2023-02-09 (×16): qty 15

## 2023-02-09 MED ORDER — ESCITALOPRAM OXALATE 10 MG PO TABS
10.0000 mg | ORAL_TABLET | Freq: Every morning | ORAL | Status: DC
  Administered 2023-02-09 – 2023-02-11 (×3): 10 mg via ORAL
  Filled 2023-02-09 (×3): qty 1

## 2023-02-09 MED ORDER — LORAZEPAM 2 MG/ML IJ SOLN: 2.0000 mg | INTRAMUSCULAR | Status: DC | PRN

## 2023-02-09 NOTE — Progress Notes (Signed)
PROGRESS NOTE    Edward Thompson   VFI:[id number] DOB: 08-08-1950  DOA: 02/08/2023 Date of Service: 02/09/23 which is hospital day 0  PCP: Sherron Monday, MD    HPI:  Edward Thompson is a 72 y.o. male with medical history significant for CAD s/p stent, seizure disorder, HTN, DM, progressive genetic neuromuscular disease x 6 years being worked up at [facility], now wheelchair-bound  with neurogenic bladder self catheterizing x 2 years with history of ESBL E. 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   Hospital course / significant events:  10/01: to ED. Hypertensive, no significant abn labs, Trauma workup was done and included CT head C-spine, left hand, left tib-fib and pelvis and left hip all negative for acute injury. Admitted to hospitalist service for observation given possible breakthrough seizure  10/02: EEG generalized slowing. MRI brain nonacute, (+)chronic microvascular ischemia. PT/OT recs for [facility]. Neuro recs hold meds for now (phenobarbital, phenytoin) and eval for mechanism which may cause high levels despite same dose for years. Per daughter he is still not at baseline. Echo pending.   Consultants:  Neurology   Procedures/Surgeries: none     ASSESSMENT & PLAN:   Altered mental status, unspecified Unwitnessed fall at home, syncope versus unwitnessed seizure with postictal state Genetic neuromuscular disorder being worked up at [facility] etiology differential includes postictal seizure Seizure w/u as below  Continuous cardiac monitoring, neuro checks  Head  CT was non-acute--will get MRI --> nonacute, (+)chronic microvascular ischemia Aspiration precautions, keep n.p.o. until closer to baseline --> SLP eval pending PT OT eval --> home health    Seizure disorder (HCC) Possible breakthrough seizure, postictal Follow phenobarbital and phenytoin levels --> high, holding meds Continue home phenobarbital and phenytoin Ativan as needed seizure Seizure precautions Neuro checks Will get EEG --> generalized slowing  Neurology following Head CT was non-acute--will get MRI --> nonacute, (+)chronic microvascular ischemia   Neuromuscular disorder (HCC) Wheelchair dependent Followed at [facility], most recently seen 02/07/2023   Neurogenic bladder, with intermittent self catheterization Given lethargy, will order every 6 hour in and out catheterization   Essential hypertension Continue home antihypertensives, amlodipine metoprolol   DM (diabetes mellitus) (HCC) Sliding scale insulin coverage   CAD s/p coronary artery stent placement Troponin normal, patient had no chest pain.  EKG showed LBBB Last saw his cardiologist Dr. Welton Flakes on 01/18/23 and was doing well Continue rosuvastatin, aspirin, metoprolol, isosorbide   OSA on CPAP CPAP nightly      obesity based on BMI: Body mass index is 33.13 kg/m.   DVT prophylaxis: lovenox  IV fluids: NS 75 mL/h continuous IV fluids  Nutrition: SLP eval pending, NPO for now  Central lines / invasive devices: none  Code Status: FULL CODE ACP documentation reviewed: 02/09/23 and none on file in [facility]YNCA  [facility] needs: Home health Barriers to dispo / significant pending items: anticipate d/c home tomorrow if echo ok              Subjective / Brief ROS:  Patient reports feeling tired He is somewhat confused,  slow to answer questions Denies CP/SOB.  Pain controlled.  Denies new weakness..  Reports no concerns w/ urination/defecation.   Family Communication: spoke on phone w/ daughter 02/09/23 5:33 PM      Objective Findings:  Vitals:   02/09/23 0500 02/09/23 0600 02/09/23 1100 02/09/23 1538  BP: (!) 110/94 124/65 130/70 127/74  Pulse:  77 82 83  Resp:  15 16 14   Temp:    98 F (36.7 C)  TempSrc:    Oral  SpO2:  97% 95% 98%  Weight:      Height:        Intake/Output Summary (Last 24 hours) at 02/09/2023 1732 Last data filed at 02/08/2023 2145 Gross per 24 hour  Intake --  Output 500 ml  Net -500 ml   Filed Weights   02/08/23 1839  Weight: 87.5 kg    Examination:  Physical Exam Constitutional:      General: He is not in acute distress. Cardiovascular:     Rate and Rhythm: Normal rate and regular rhythm.  Pulmonary:     Effort: Pulmonary effort is normal.     Breath sounds: Normal breath sounds.  Abdominal:     Palpations: Abdomen is soft.  Skin:    General: Skin is warm and dry.  Neurological:     Mental Status: He is alert. He is disoriented.     Motor: Weakness present.  Psychiatric:        Mood and Affect: Mood normal.        Behavior: Behavior normal.          Scheduled Medications:   aspirin EC  81 mg Oral QHS   enoxaparin (LOVENOX) injection  45 mg Subcutaneous Q24H   escitalopram  10 mg Oral q morning   insulin aspart  0-15 Units Subcutaneous Q4H   mouth rinse  15 mL Mouth Rinse Q2H   pantoprazole  40 mg Oral q morning   phenytoin  300 mg Oral QHS   sodium chloride flush  3 mL Intravenous Q12H    Continuous Infusions:  sodium chloride 75 mL/hr at 02/09/23 1042    PRN Medications:  acetaminophen **OR** acetaminophen, HYDROcodone-acetaminophen, LORazepam, nitroGLYCERIN, ondansetron **OR** ondansetron (ZOFRAN) IV, mouth rinse  Antimicrobials from admission:  Anti-infectives (From admission, onward)    None           Data Reviewed:  I have personally reviewed the following...  CBC: Recent Labs  Lab 02/08/23 1523 02/09/23 0340  WBC 9.3 6.4  HGB 11.8* 12.3*  HCT 36.1* 37.8*  MCV 94.3 94.5  PLT 165 171   Basic  Metabolic Panel: Recent Labs  Lab 02/08/23 1523 02/09/23 0340  NA 139  --   K 3.7  --   CL 103  --   CO2 25  --   GLUCOSE 116*  --   BUN 18  --   CREATININE 0.67 0.61  CALCIUM 8.9  --    GFR: Estimated Creatinine Clearance: 83.2 mL/min (by C-G formula based on SCr of 0.61 mg/dL). Liver Function Tests: No results for input(s): "AST", "ALT", "ALKPHOS", "BILITOT", "PROT", "ALBUMIN" in the last 168 hours. No results for input(s): "LIPASE", "AMYLASE" in the last 168 hours. No results for input(s): "AMMONIA" in the last 168 hours. Coagulation Profile: No results for input(s): "INR", "PROTIME" in the last 168 hours. Cardiac Enzymes: Recent Labs  Lab 02/08/23 1520  CKTOTAL 110   BNP (last 3 results) No results for input(s): "PROBNP" in the last 8760 hours.  HbA1C: No results for input(s): "HGBA1C" in the last 72 hours. CBG: Recent Labs  Lab 02/09/23 0631 02/09/23 1009 02/09/23 1212 02/09/23 1603  GLUCAP 108* 120* 104* 98   Lipid Profile: No results for input(s): "CHOL", "HDL", "LDLCALC", "TRIG", "CHOLHDL", "LDLDIRECT" in the last 72 hours. Thyroid Function Tests: No results for input(s): "TSH", "T4TOTAL", "FREET4", "T3FREE", "THYROIDAB" in the last 72 hours. Anemia Panel: No results for input(s): "VITAMINB12", "FOLATE", "FERRITIN", "TIBC", "IRON", "RETICCTPCT" in the last 72 hours. Most Recent Urinalysis On File:     Component Value Date/Time   COLORURINE YELLOW (A) 02/08/2023 1758   APPEARANCEUR CLEAR (A) 02/08/2023 1758   APPEARANCEUR Clear 08/21/2021 1046   LABSPEC 1.016 02/08/2023 1758   PHURINE 7.0 02/08/2023 1758   GLUCOSEU NEGATIVE 02/08/2023 1758   HGBUR NEGATIVE 02/08/2023 1758   BILIRUBINUR NEGATIVE 02/08/2023 1758   BILIRUBINUR 1 01/17/2023 1522   BILIRUBINUR Negative 08/21/2021 1046   KETONESUR NEGATIVE 02/08/2023 1758   PROTEINUR NEGATIVE 02/08/2023 1758   UROBILINOGEN 0.2 01/17/2023 1522   NITRITE NEGATIVE 02/08/2023 1758   LEUKOCYTESUR NEGATIVE  02/08/2023 1758   Sepsis Labs: @LABRCNTIP (procalcitonin:4,lacticidven:4) Microbiology: No results found for this or any previous visit (from the past 240 hour(s)).    Radiology Studies last 3 days: EEG adult  Result Date: 02/09/2023 Rejeana Brock, MD     02/09/2023  4:03 PM History: 72 year old male with encephalopathy Sedation: None Technique: This EEG was acquired with electrodes placed according to the International 10-20 electrode system (including Fp1, Fp2, F3, F4, C3, C4, P3, P4, O1, O2, T3, T4, T5, T6, A1, A2, Fz, Cz, Pz). The following electrodes were missing or displaced: none. Patient State: Awake and drowsy Background: There is a normally distributed, well-formed posterior dominant rhythm that achieves a maximal frequency of 7 Hz.  There is mild intrusion into the background of generalized irregular delta and theta range activities as well.  No epileptiform discharges were seen. Photic stimulation: Physiologic driving is not performed EEG Abnormalities: 1) generalized irregular slow activity 2) slow posterior dominant rhythm Clinical Interpretation: This EEG is consistent with a generalized nonspecific cerebral dysfunction (encephalopathy). There was no seizure or seizure predisposition recorded on this study. Please note that lack of epileptiform activity on EEG does not preclude the possibility of epilepsy. Ritta Slot, MD [facility] [phone] If 7pm- 7am, please page neurology on call as listed in AMION.   MR BRAIN WO CONTRAST  Result Date: 02/09/2023 CLINICAL DATA:  Altered mental status EXAM: MRI HEAD WITHOUT CONTRAST TECHNIQUE: Multiplanar, multiecho pulse sequences of the brain and surrounding structures were obtained without intravenous contrast. COMPARISON:  11/24/2021 FINDINGS: Brain: No acute infarct, mass effect or extra-axial collection. No chronic microhemorrhage or siderosis. There is multifocal hyperintense T2-weighted signal within the white  matter. Generalized volume loss. The midline structures are normal. Vascular: Normal flow voids. Skull and upper cervical spine: Normal marrow signal. Sinuses/Orbits: Negative. Other: None. IMPRESSION: 1. No acute intracranial abnormality. 2. Findings of chronic microvascular ischemia and volume loss. Electronically Signed   By: Deatra Robinson M.D.   On: 02/09/2023 02:44   DG Hip Unilat With Pelvis 2-3 Views Left  Result Date: 02/08/2023 CLINICAL DATA:  Fall and trauma to the left lower extremity and pain. EXAM: DG HIP (WITH OR WITHOUT PELVIS) 2-3V LEFT; LEFT TIBIA AND FIBULA - 2 VIEW COMPARISON:  None Available. FINDINGS: There is no acute fracture or dislocation. The bones are osteopenic. Mild arthritic changes of the hips and left knee. There is diffuse subcutaneous edema.  No radiopaque foreign object or soft tissue gas. Vascular calcifications. IMPRESSION: 1. No acute fracture or dislocation. 2. Osteopenia. Electronically Signed   By: Elgie Collard M.D.   On: 02/08/2023 20:42   DG Tibia/Fibula Left  Result Date: 02/08/2023 CLINICAL DATA:  Fall and trauma to the left lower extremity and pain. EXAM: DG HIP (WITH OR WITHOUT PELVIS) 2-3V LEFT; LEFT TIBIA AND FIBULA - 2 VIEW COMPARISON:  None Available. FINDINGS: There is no acute fracture or dislocation. The bones are osteopenic. Mild arthritic changes of the hips and left knee. There is diffuse subcutaneous edema. No radiopaque foreign object or soft tissue gas. Vascular calcifications. IMPRESSION: 1. No acute fracture or dislocation. 2. Osteopenia. Electronically Signed   By: Elgie Collard M.D.   On: 02/08/2023 20:42   DG Hand Complete Left  Result Date: 02/08/2023 CLINICAL DATA:  Fall.  4th digit pain.  Bruising. EXAM: LEFT HAND - COMPLETE 3+ VIEW COMPARISON:  Left fifth finger radiographs 10/05/2004 FINDINGS: There is diffuse decreased bone mineralization. 3 mm ulnar negative variance. Mild distal radioulnar joint space narrowing and peripheral  spurring. Minimal thumb carpometacarpal joint space narrowing and peripheral spurring. Moderate fifth DIP and mild second through fourth DIP joint space narrowing. Moderate to severe fifth PIP joint space narrowing and peripheral osteophytosis. Interval healing of the prior fracture at the base of the middle phalanx of fifth finger on remote 2006 radiographs. No acute fracture is seen.  No dislocation. IMPRESSION: 1. No acute fracture. 2. Moderate to severe fifth PIP and moderate fifth DIP osteoarthritis. Electronically Signed   By: Neita Garnet M.D.   On: 02/08/2023 18:48   CT Cervical Spine Wo Contrast  Result Date: 02/08/2023 CLINICAL DATA:  Fall EXAM: CT CERVICAL SPINE WITHOUT CONTRAST TECHNIQUE: Multidetector CT imaging of the cervical spine was performed without intravenous contrast. Multiplanar CT image reconstructions were also generated. RADIATION DOSE REDUCTION: This exam was performed according to the departmental dose-optimization program which includes automated exposure control, adjustment of the mA and/or kV according to patient size and/or use of iterative reconstruction technique. COMPARISON:  09/18/2021 FINDINGS: Alignment: No subluxation Skull base and vertebrae: No acute fracture. No primary bone lesion or focal pathologic process. Soft tissues and spinal canal: No prevertebral fluid or swelling. No visible canal hematoma. Disc levels: Maintained. Early degenerative facet disease bilaterally. No disc herniation. Upper chest: No acute findings Other: None IMPRESSION: No acute bony abnormality. Electronically Signed   By: Charlett Nose M.D.   On: 02/08/2023 17:06   CT HEAD WO CONTRAST ( )  Result Date: 02/08/2023 CLINICAL DATA:  Fall EXAM: CT HEAD WITHOUT CONTRAST TECHNIQUE: Contiguous axial images were obtained from the base of the skull through the vertex without intravenous contrast. RADIATION DOSE REDUCTION: This exam was performed according to the departmental dose-optimization  program which includes automated exposure control, adjustment of the mA and/or kV according to patient size and/or use of iterative reconstruction technique. COMPARISON:  01/15/2022 FINDINGS: Brain: There is atrophy and chronic small vessel disease changes. No acute intracranial abnormality. Specifically, no hemorrhage, hydrocephalus, mass lesion, acute infarction, or significant intracranial injury. Vascular: No hyperdense vessel or unexpected calcification. Skull: No acute calvarial abnormality. Sinuses/Orbits: No acute findings Other: None IMPRESSION: Atrophy, chronic microvascular disease. No acute intracranial abnormality. Electronically Signed   By: Charlett Nose M.D.   On: 02/08/2023 17:05       Sunnie Nielsen, DO [facility] 02/09/2023, 5:32 PM    Dictation software may have been used to generate the above note.  Typos may occur and escape review in typed/dictated notes. Please contact Dr Lyn Hollingshead directly for clarity if needed.  Staff may message me via secure chat in Epic  but this may not receive an immediate response,  please page me for urgent matters!  If 7PM-7AM, please contact night coverage [email or web address]

## 2023-02-09 NOTE — Assessment & Plan Note (Addendum)
Wheelchair dependent Followed at [facility], most recently seen 02/07/2023

## 2023-02-09 NOTE — Assessment & Plan Note (Addendum)
Possible breakthrough seizure, postictal Follow phenobarbital and phenytoin levels Continue home phenobarbital and phenytoin Ativan as needed seizure Seizure precautions Will get EEG Neurology has been consulted

## 2023-02-09 NOTE — Hospital Course (Addendum)
HPI:  Edward Thompson is a 72 y.o. male with medical history significant for CAD s/p stent, seizure disorder, HTN, DM, progressive genetic neuromuscular disease x 6 years being worked up at [facility], now wheelchair-bound  with neurogenic bladder self catheterizing x 2 years with history of ESBL E. 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   Of note, recent visit w/ [facility] neurology 09/30, notes reviewed, Dr. 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   Hospital course / significant events:  10/01: to ED. Hypertensive, no significant abn labs, Trauma workup was done and included CT head C-spine, left hand, left tib-fib and pelvis and left hip all negative for acute injury. Admitted to hospitalist service for observation given possible breakthrough seizure  10/02: EEG generalized slowing. MRI brain nonacute, (+)chronic microvascular ischemia. PT/OT recs for [facility] Neuro recs hold meds for now (phenobarbital, phenytoin) and eval for mechanism which may cause high levels despite same dose for years. Per daughter he is still not at baseline. Echo pending.  10/03: neuro recs for repeat phenobarb and phenytoin levels again tomorrow, working on confirming close outpatient f/u  10/04: neuro recs to resume phenytoin at 230 mg daily and resume phenobarbital at 97.2 mg. Plan follow up for levels this week w/ [facility] - see below. Discussed plan w/ daughter, advised she should expect call from the clinic and to call them Monday if she doesn't hear from them by lunchtime.    Consultants:  Neurology   Procedures/Surgeries: none     ASSESSMENT & PLAN:   Altered mental status, unspecified Unwitnessed fall at home, syncope versus unwitnessed seizure with postictal state Genetic neuromuscular disorder being worked up at [employer] etiology differential includes postictal seizure, polypharmacy/medication effect, concussion effect d/t fall w/ head trauma  Seizure w/u and medication adjustments as below  Continuous cardiac monitoring, neuro checks  Head CT was non-acute and MRI brain also nonacute, (+)chronic microvascular ischemia Aspiration precautions PT OT eval --> home health    Seizure disorder (HCC) Possible breakthrough seizure, postictal EEG --> generalized slowing  MRI brain - no concerns  Elevated/supratherapeutic phenobarbital and phenytoin levels --> high, had held meds per neuro recs, follow levels 10/03  (improving) and tomorrow Phenytoin level: 28.1 on 10/02 --> 21.8 on 10/03 --> 20.6 on 10/04. PLAN: 100mg  dose of phenytoin 10/03 PM, then plan to resume at 230mg  at bedtime 10/04.   Phenobarbital level: 51.9 on 10/02 --> 47.5 on 10/03 --> *** on 10/04. PLAN: resume phenobarbital at 97.2 mg I spoke w/ his outpatient neurology clinic and was directed to [facility] Nurse Navigator, they do not manage seizure medications, I called [facility] neurology and spoke w/ answering service advised he will need f/u levels ideally on Monday for phenobarbital and phenytoin and ideally f/u w/ physician/APP soon after.    Neuromuscular disorder [facility]) Wheelchair dependent Followed at [facility], most recently seen 02/07/2023 Follow as directed    Neurogenic bladder, with intermittent self catheterization Resume self-cath Outpatient follow up    Essential hypertension Continue home antihypertensives, amlodipine metoprolol  DM (diabetes mellitus) (HCC) Resume home meds    CAD s/p coronary artery stent placement Troponin normal,  patient had no chest pain.  EKG showed LBBB Last saw his cardiologist Dr. Welton Flakes on 01/18/23 and was doing well Continue rosuvastatin, aspirin, metoprolol, isosorbide   OSA on CPAP CPAP nightly      obesity based on BMI: Body mass index is 33.13 kg/m.   DVT prophylaxis: lovenox  IV fluids: NS 75 mL/h continuous IV fluids  Nutrition: SLP eval pending, NPO for now  Central lines / invasive devices: none  Code Status: FULL CODE ACP documentation reviewed: 02/09/23 and none on file in [facility]YNCA  [facility] needs: Home health Barriers to dispo / significant pending items: anticipate d/c home tomorrow if lab levels are still trending appropriately

## 2023-02-09 NOTE — Assessment & Plan Note (Deleted)
Troponin normal, patient had no chest pain.  EKG showed LBBB Continue rosuvastatin, aspirin, metoprolol, isosorbide

## 2023-02-09 NOTE — Progress Notes (Signed)
Eeg done 

## 2023-02-09 NOTE — ED Notes (Signed)
Patient attempting to get out of recliner at this time. This RN to ask friend/family at bedside if patient walked at home, and individuals stated "no." Patient given urinal. 200 mL output of urine at this time.

## 2023-02-09 NOTE — Assessment & Plan Note (Signed)
Sliding scale insulin coverage 

## 2023-02-09 NOTE — Progress Notes (Signed)
PHARMACIST - PHYSICIAN COMMUNICATION  CONCERNING:  Enoxaparin (Lovenox) for DVT Prophylaxis    RECOMMENDATION: Patient was prescribed enoxaprin 40mg  q24 hours for VTE prophylaxis.   Filed Weights   02/08/23 1839  Weight: 87.5 kg (193 lb)    Body mass index is 33.13 kg/m.  Estimated Creatinine Clearance: 83.2 mL/min (by C-G formula based on SCr of 0.67 mg/dL).   Based on [facility] policy patient is candidate for enoxaparin 0.5mg /kg TBW SQ every 24 hours based on BMI being >30.  DESCRIPTION: Pharmacy has adjusted enoxaparin dose per [facility] policy.  Patient is now receiving enoxaparin 0.5 mg/kg every 24 hours   Otelia Sergeant, PharmD, [facility] 02/09/2023 12:54 AM

## 2023-02-09 NOTE — Procedures (Signed)
History: 72 year old male with encephalopathy  Sedation: None  Technique: This EEG was acquired with electrodes placed according to the International 10-20 electrode system (including Fp1, Fp2, F3, F4, C3, C4, P3, P4, O1, O2, T3, T4, T5, T6, A1, A2, Fz, Cz, Pz). The following electrodes were missing or displaced: none.  Patient State: Awake and drowsy  Background: There is a normally distributed, well-formed posterior dominant rhythm that achieves a maximal frequency of 7 Hz.  There is mild intrusion into the background of generalized irregular delta and theta range activities as well.  No epileptiform discharges were seen.  Photic stimulation: Physiologic driving is not performed  EEG Abnormalities: 1) generalized irregular slow activity 2) slow posterior dominant rhythm  Clinical Interpretation: This EEG is consistent with a generalized nonspecific cerebral dysfunction (encephalopathy). There was no seizure or seizure predisposition recorded on this study. Please note that lack of epileptiform activity on EEG does not preclude the possibility of epilepsy.   Ritta Slot, MD [facility] [phone]  If 7pm- 7am, please page neurology on call as listed in AMION.

## 2023-02-09 NOTE — Assessment & Plan Note (Addendum)
Unwitnessed fall at home, syncope versus unwitnessed seizure with postictal state Genetic neuromuscular disorder being worked up at [employer] etiology differential includes postictal seizure Head CT was non-acute--will get MRI Will keep n.p.o. until closer to baseline Neurologic checks Aspiration precautions Continuous cardiac monitoring PT OT eval Neurology consulted

## 2023-02-09 NOTE — Assessment & Plan Note (Signed)
CPAP nightly
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
## 2023-02-09 NOTE — Assessment & Plan Note (Addendum)
Troponin normal, patient had no chest pain.  EKG showed LBBB Last saw his cardiologist Dr. Welton Flakes on 01/18/23 and was doing well Continue rosuvastatin, aspirin, metoprolol, isosorbide

## 2023-02-09 NOTE — Evaluation (Addendum)
Occupational Therapy Evaluation Patient Details Name: Edward Thompson MRN: [id number] DOB: 15-Mar-1951 Today's Date: 02/09/2023   History of Present Illness Pt is a 72 year old male presenting to the ED after family found him on the floor with unknown down time, AMS, workup ongoing;     PMH significant for CAD s/p stent, seizure disorder, HTN, DM, progressive genetic neuromuscular disease x 6 years being worked up at [facility], now wheelchair-bound  with neurogenic bladder self catheterizing x 2 years with history of ESBL E. coli UTI in 2023, as well as history of OSA on CPAP, and mild cognitive impairment   Clinical Impression   Chart reviewed, pt greetd in room with PT present. Co tx completed with PT on this date. Pt is oriented to self, grossly oriented to situation, grossly oriented to place and requires cueing for correct month. PTA pt reports he performs MRADLs household distances with mwc, will get into a regular car and take personal mwc to doctors appts. He self propels with his arms per his report. Pt reports requiring assist for LB dressing and IADLs, performs other ADLs grossly with MOD I per his report, will need to confirm PLOF. Pt performs LB dressing with MAX A, simulated toilet transfer to bedside chair with CGA +2 for safety. Pt reports he feels he is close to current baseline with some slight weakness. Pt will benefit from ongoing OT to address deficits and to facilitate optimal ADL completion. OT will follow acutely.       If plan is discharge home, recommend the following: A little help with walking and/or transfers;A little help with bathing/dressing/bathroom;Assistance with cooking/housework;Direct supervision/assist for financial management;Assist for transportation;Help with stairs or ramp for entrance;Direct supervision/assist for medications management    Functional Status Assessment  Patient has had a recent decline in their functional status and demonstrates the ability to  make significant improvements in function in a reasonable and predictable amount of time.  Equipment Recommendations  None recommended by OT;Other (comment) (consider further work up with [facility] therapy for power mobility device as approrpiate)    Recommendations for Other Services       Precautions / Restrictions Precautions Precautions: Fall Restrictions Weight Bearing Restrictions: No      Mobility Bed Mobility Overal bed mobility: Needs Assistance Bed Mobility: Rolling, Sidelying to Sit Rolling: Supervision, Used rails, +2 for safety/equipment Sidelying to sit: Supervision, Used rails, +2 for safety/equipment            Transfers Overall transfer level: Needs assistance   Transfers: Sit to/from Stand, Bed to chair/wheelchair/BSC Sit to Stand: Min assist, +2 physical assistance                  Balance Overall balance assessment: Needs assistance Sitting-balance support: Feet supported Sitting balance-Leahy Scale: Fair     Standing balance support: Bilateral upper extremity supported, During functional activity Standing balance-Leahy Scale: Poor                             ADL either performed or assessed with clinical judgement   ADL Overall ADL's : Needs assistance/impaired                     Lower Body Dressing: Maximal assistance Lower Body Dressing Details (indicate cue type and reason): socks- reports he has assist at baseline Toilet Transfer: Contact guard assist;+2 for safety/equipment;Stand-pivot (+ 2 for lines/leads; anticipate +1 future attempts)  Functional mobility during ADLs: Contact guard assist (wheelchair level transfers which is baseline)       Vision Patient Visual Report: No change from baseline       Perception         Praxis         Pertinent Vitals/Pain Pain Assessment Pain Assessment: No/denies pain     Extremity/Trunk Assessment Upper Extremity Assessment Upper Extremity  Assessment: Generalized weakness   Lower Extremity Assessment Lower Extremity Assessment: Generalized weakness (able to minimally lift both BLE against gravity, noted for no active DF bilaterally)   Cervical / Trunk Assessment Cervical / Trunk Assessment: Normal   Communication Communication Communication: No apparent difficulties Cueing Techniques: Verbal cues;Tactile cues;Visual cues   Cognition Arousal: Alert Behavior During Therapy: Flat affect, WFL for tasks assessed/performed Overall Cognitive Status: No family/caregiver present to determine baseline cognitive functioning Area of Impairment: Problem solving, Awareness, Following commands, Orientation                 Orientation Level:  (grossly oriented to situation; cues required for month)     Following Commands: Follows one step commands consistently   Awareness: Emergent Problem Solving: Slow processing, Requires verbal cues, Requires tactile cues       General Comments  vss throughout; edema noted throughout BLE    Exercises Other Exercises Other Exercises: edu re: role of OT, role of rehab, discharge recommendations, home safety, falls prevention   Shoulder Instructions      Home Living Family/patient expects to be discharged to:: Private residence Living Arrangements: Other relatives;Children Available Help at Discharge: Family Type of Home: House Home Access: Ramped entrance     Home Layout: One level     Bathroom Shower/Tub: Chief Strategy Officer: Standard Bathroom Accessibility: Yes How Accessible: Accessible via wheelchair Home Equipment: Wheelchair - manual;Grab bars - tub/shower;Shower seat          Prior Functioning/Environment Prior Level of Function : Needs assist;History of Falls (last six months)             Mobility Comments: pt reports bed<>mwc transfers via SPT without assist, mwc for mobility, frequent falls ADLs Comments: feeding, grooming with set up-MOD  I, bathing pt reports he uses chair in the tub/shower and can complete transfer with MOD I, dressing with MOD I, self caths standing at toilet however endorses falls when doing this; assist for IADLs from family per pt report        OT Problem List: Decreased strength;Decreased activity tolerance;Impaired balance (sitting and/or standing);Decreased knowledge of precautions;Decreased knowledge of use of DME or AE      OT Treatment/Interventions: Self-care/ADL training;Balance training;Therapeutic exercise;Therapeutic activities;DME and/or AE instruction;Patient/family education    OT Goals(Current goals can be found in the care plan section) Acute Rehab OT Goals Patient Stated Goal: go home OT Goal Formulation: With patient Time For Goal Achievement: 02/23/23 Potential to Achieve Goals: Good ADL Goals Pt Will Perform Grooming: with modified independence;sitting Pt Will Transfer to Toilet: with modified independence Pt Will Perform Toileting - Clothing Manipulation and hygiene: with modified independence  OT Frequency: Min 1X/week    Co-evaluation   Reason for Co-Treatment: For patient/therapist safety;To address functional/ADL transfers PT goals addressed during session: Mobility/safety with mobility;Balance OT goals addressed during session: ADL's and self-care;Proper use of Adaptive equipment and DME      AM-PAC OT "6 Clicks" Daily Activity     Outcome Measure Help from another person eating meals?: None Help from another person taking  care of personal grooming?: A Little Help from another person toileting, which includes using toliet, bedpan, or urinal?: A Lot Help from another person bathing (including washing, rinsing, drying)?: A Little Help from another person to put on and taking off regular upper body clothing?: A Little Help from another person to put on and taking off regular lower body clothing?: A Lot 6 Click Score: 17   End of Session Nurse Communication: Mobility  status  Activity Tolerance: Patient tolerated treatment well Patient left: in chair;with call bell/phone within reach  OT Visit Diagnosis: Other abnormalities of gait and mobility (R26.89)                Time: 8413-2440 OT Time Calculation (min): 22 min Charges:  OT General Charges $OT Visit: 1 Visit OT Evaluation $OT Eval Low Complexity: 1 Low  Oleta Mouse, OTD OTR/L  02/09/23, 3:46 PM

## 2023-02-09 NOTE — ED Notes (Signed)
EEG at bedside.

## 2023-02-09 NOTE — Evaluation (Signed)
Physical Therapy Evaluation Patient Details Name: Edward Thompson MRN: [id number] DOB: 1951-03-08 Today's Date: 02/09/2023  History of Present Illness  Pt is a 72 year old male presenting to the ED after family found him on the floor with unknown down time, AMS, workup ongoing;     PMH significant for CAD s/p stent, seizure disorder, HTN, DM, progressive genetic neuromuscular disease x 6 years being worked up at [facility], now wheelchair-bound  with neurogenic bladder self catheterizing x 2 years with history of ESBL E. coli UTI in 2023, as well as history of OSA on CPAP, and mild cognitive impairment.   Clinical Impression  Pt wakes to voice, oriented to self, reported "PT place" when asked about location, month/year (did initially say September) able to recall he  had a fall prior to admission. Per pt at baseline he is able to stand pivot to [facility] modI at baseline, does not ambulate. Lives with family that assists with IADLs.   Seen with OT to maximize functional mobility. He was able to perform rolling and sidelying to sit with supervision, use of bed rails. Fair sitting balance noted. Sit <> Stand with two person handheld assist minAx2 and take 1 step to the left. With set up assist and CGAx2 he was able to step pivot to recliner in the room.  Overall the patient demonstrated near return to baseline level of functioning but would benefit from skilled PT intervention to maximize safety, function, and independence.         If plan is discharge home, recommend the following: A little help with bathing/dressing/bathroom;A little help with walking and/or transfers;Assistance with cooking/housework;Assist for transportation;Help with stairs or ramp for entrance;Direct supervision/assist for medications management   Can travel by private vehicle        Equipment Recommendations None recommended by PT  Recommendations for Other Services       Functional Status Assessment Patient has had a recent decline  in their functional status and demonstrates the ability to make significant improvements in function in a reasonable and predictable amount of time.     Precautions / Restrictions Precautions Precautions: Fall Restrictions Weight Bearing Restrictions: No      Mobility  Bed Mobility Overal bed mobility: Needs Assistance Bed Mobility: Rolling, Sidelying to Sit Rolling: Supervision, Used rails, +2 for safety/equipment Sidelying to sit: Supervision, Used rails, +2 for safety/equipment            Transfers Overall transfer level: Needs assistance   Transfers: Sit to/from Stand, Bed to chair/wheelchair/BSC Sit to Stand: Min assist, +2 physical assistance   Step pivot transfers: Contact guard assist, +2 safety/equipment       General transfer comment: able to stand from EOB minAx2 bilateral handheld assist and take one step to the L. step pivot to recliner cgaX2 for safety    Ambulation/Gait               General Gait Details: pt does not ambulate at baseline  Stairs            Wheelchair Mobility     Tilt Bed    Modified Rankin (Stroke Patients Only)       Balance   Sitting-balance support: Feet supported Sitting balance-Leahy Scale: Fair     Standing balance support: Bilateral upper extremity supported, During functional activity Standing balance-Leahy Scale: Poor  Pertinent Vitals/Pain Pain Assessment Pain Assessment: No/denies pain    Home Living Family/patient expects to be discharged to:: Private residence Living Arrangements: Other relatives;Children Available Help at Discharge: Family Type of Home: House Home Access: Ramped entrance       Home Layout: One level Home Equipment: Wheelchair - manual;Grab bars - tub/shower;Shower seat      Prior Function Prior Level of Function : Needs assist;History of Falls (last six months)             Mobility Comments: pt reports bed<>mwc  transfers via SPT without assist, mwc for mobility, frequent falls ADLs Comments: feeding, grooming with set up-MOD I, bathing pt reports he uses chair in the tub/shower and can complete transfer with MOD I, dressing with MOD I, self caths standing at toilet however endorses falls when doing this; assist for IADLs from family per pt report     Extremity/Trunk Assessment   Upper Extremity Assessment Upper Extremity Assessment: Generalized weakness    Lower Extremity Assessment Lower Extremity Assessment: Generalized weakness (able to minimally lift both BLE against gravity, noted for no active DF bilaterally)    Cervical / Trunk Assessment Cervical / Trunk Assessment: Normal  Communication   Communication Communication: No apparent difficulties Cueing Techniques: Verbal cues;Tactile cues;Visual cues  Cognition Arousal: Alert Behavior During Therapy: Flat affect, WFL for tasks assessed/performed Overall Cognitive Status: No family/caregiver present to determine baseline cognitive functioning Area of Impairment: Problem solving, Awareness, Following commands, Orientation                 Orientation Level:  (grossly oriented to time, able to report that he was here for a fall)         Awareness: Emergent Problem Solving: Slow processing, Requires verbal cues, Requires tactile cues          General Comments General comments (skin integrity, edema, etc.): vss throughout    Exercises     Assessment/Plan    PT Assessment Patient needs continued PT services  PT Problem List Decreased activity tolerance;Decreased balance;Decreased mobility;Decreased knowledge of use of DME       PT Treatment Interventions DME instruction;Neuromuscular re-education;Gait training;Stair training;Patient/family education;Therapeutic activities;Functional mobility training;Wheelchair mobility training;Therapeutic exercise;Balance training    PT Goals (Current goals can be found in the Care  Plan section)  Acute Rehab PT Goals Patient Stated Goal: to feel better PT Goal Formulation: With patient Time For Goal Achievement: 02/23/23 Potential to Achieve Goals: Fair    Frequency Min 1X/week     Co-evaluation PT/OT/SLP Co-Evaluation/Treatment: Yes Reason for Co-Treatment: For patient/therapist safety;To address functional/ADL transfers PT goals addressed during session: Mobility/safety with mobility;Balance OT goals addressed during session: ADL's and self-care;Proper use of Adaptive equipment and DME       AM-PAC PT "6 Clicks" Mobility  Outcome Measure Help needed turning from your back to your side while in a flat bed without using bedrails?: A Little Help needed moving from lying on your back to sitting on the side of a flat bed without using bedrails?: A Little Help needed moving to and from a bed to a chair (including a wheelchair)?: A Little Help needed standing up from a chair using your arms (e.g., wheelchair or bedside chair)?: A Little Help needed to walk in hospital room?: Total Help needed climbing 3-5 steps with a railing? : Total 6 Click Score: 14    End of Session Equipment Utilized During Treatment: Gait belt Activity Tolerance: Patient tolerated treatment well Patient left: in chair;with call bell/phone  within reach Nurse Communication: Mobility status PT Visit Diagnosis: Other abnormalities of gait and mobility (R26.89);Difficulty in walking, not elsewhere classified (R26.2);Muscle weakness (generalized) (M62.81)    Time: 1610-9604 PT Time Calculation (min) (ACUTE ONLY): 29 min   Charges:   PT Evaluation $PT Eval Moderate Complexity: 1 Mod PT Treatments $Therapeutic Activity: 8-22 mins PT General Charges $$ ACUTE PT VISIT: 1 Visit       Olga Coaster PT, DPT 3:44 PM,02/09/23

## 2023-02-09 NOTE — Assessment & Plan Note (Signed)
Given lethargy, will order every 6 hour in and out catheterization

## 2023-02-09 NOTE — Assessment & Plan Note (Signed)
Continue home antihypertensives, amlodipine metoprolol

## 2023-02-10 DIAGNOSIS — N319 Neuromuscular dysfunction of bladder, unspecified: Secondary | ICD-10-CM | POA: Diagnosis present

## 2023-02-10 DIAGNOSIS — Y92009 Unspecified place in unspecified non-institutional (private) residence as the place of occurrence of the external cause: Secondary | ICD-10-CM | POA: Diagnosis not present

## 2023-02-10 DIAGNOSIS — Z955 Presence of coronary angioplasty implant and graft: Secondary | ICD-10-CM | POA: Diagnosis not present

## 2023-02-10 DIAGNOSIS — W19XXXA Unspecified fall, initial encounter: Secondary | ICD-10-CM | POA: Diagnosis present

## 2023-02-10 DIAGNOSIS — R4182 Altered mental status, unspecified: Secondary | ICD-10-CM | POA: Diagnosis present

## 2023-02-10 DIAGNOSIS — Z7902 Long term (current) use of antithrombotics/antiplatelets: Secondary | ICD-10-CM | POA: Diagnosis not present

## 2023-02-10 DIAGNOSIS — E785 Hyperlipidemia, unspecified: Secondary | ICD-10-CM | POA: Diagnosis present

## 2023-02-10 DIAGNOSIS — Z993 Dependence on wheelchair: Secondary | ICD-10-CM | POA: Diagnosis not present

## 2023-02-10 DIAGNOSIS — M79661 Pain in right lower leg: Secondary | ICD-10-CM | POA: Diagnosis not present

## 2023-02-10 DIAGNOSIS — Z951 Presence of aortocoronary bypass graft: Secondary | ICD-10-CM | POA: Diagnosis not present

## 2023-02-10 DIAGNOSIS — E669 Obesity, unspecified: Secondary | ICD-10-CM | POA: Diagnosis present

## 2023-02-10 DIAGNOSIS — D649 Anemia, unspecified: Secondary | ICD-10-CM | POA: Diagnosis present

## 2023-02-10 DIAGNOSIS — I252 Old myocardial infarction: Secondary | ICD-10-CM | POA: Diagnosis not present

## 2023-02-10 DIAGNOSIS — I2510 Atherosclerotic heart disease of native coronary artery without angina pectoris: Secondary | ICD-10-CM | POA: Diagnosis present

## 2023-02-10 DIAGNOSIS — M79662 Pain in left lower leg: Secondary | ICD-10-CM | POA: Diagnosis not present

## 2023-02-10 DIAGNOSIS — I10 Essential (primary) hypertension: Secondary | ICD-10-CM | POA: Diagnosis present

## 2023-02-10 DIAGNOSIS — T50995A Adverse effect of other drugs, medicaments and biological substances, initial encounter: Secondary | ICD-10-CM | POA: Diagnosis present

## 2023-02-10 DIAGNOSIS — Z7984 Long term (current) use of oral hypoglycemic drugs: Secondary | ICD-10-CM | POA: Diagnosis not present

## 2023-02-10 DIAGNOSIS — Z6833 Body mass index (BMI) 33.0-33.9, adult: Secondary | ICD-10-CM | POA: Diagnosis not present

## 2023-02-10 DIAGNOSIS — G40909 Epilepsy, unspecified, not intractable, without status epilepticus: Principal | ICD-10-CM

## 2023-02-10 DIAGNOSIS — E119 Type 2 diabetes mellitus without complications: Secondary | ICD-10-CM | POA: Diagnosis present

## 2023-02-10 DIAGNOSIS — R296 Repeated falls: Secondary | ICD-10-CM | POA: Diagnosis present

## 2023-02-10 DIAGNOSIS — G709 Myoneural disorder, unspecified: Secondary | ICD-10-CM | POA: Diagnosis present

## 2023-02-10 DIAGNOSIS — Z8249 Family history of ischemic heart disease and other diseases of the circulatory system: Secondary | ICD-10-CM | POA: Diagnosis not present

## 2023-02-10 DIAGNOSIS — G4733 Obstructive sleep apnea (adult) (pediatric): Secondary | ICD-10-CM | POA: Diagnosis present

## 2023-02-10 DIAGNOSIS — Z8619 Personal history of other infectious and parasitic diseases: Secondary | ICD-10-CM | POA: Diagnosis not present

## 2023-02-10 DIAGNOSIS — Z79899 Other long term (current) drug therapy: Secondary | ICD-10-CM | POA: Diagnosis not present

## 2023-02-10 DIAGNOSIS — I447 Left bundle-branch block, unspecified: Secondary | ICD-10-CM | POA: Diagnosis present

## 2023-02-10 LAB — ECHOCARDIOGRAM COMPLETE
Area-P 1/2: 4.4 cm2
Height: 64 in
S' Lateral: 2.9 cm
Weight: 3088 [oz_av]

## 2023-02-10 LAB — PHENOBARBITAL LEVEL: Phenobarbital: 47.5 ug/mL — ABNORMAL HIGH (ref 15.0–40.0)

## 2023-02-10 LAB — PHENYTOIN LEVEL, TOTAL: Phenytoin Lvl: 21.8 ug/mL — ABNORMAL HIGH (ref 10.0–20.0)

## 2023-02-10 LAB — GLUCOSE, CAPILLARY
Glucose-Capillary: 101 mg/dL — ABNORMAL HIGH (ref 70–99)
Glucose-Capillary: 98 mg/dL (ref 70–99)
Glucose-Capillary: 138 mg/dL — ABNORMAL HIGH (ref 70–99)
Glucose-Capillary: 163 mg/dL — ABNORMAL HIGH (ref 70–99)
Glucose-Capillary: 142 mg/dL — ABNORMAL HIGH (ref 70–99)
Glucose-Capillary: 110 mg/dL — ABNORMAL HIGH (ref 70–99)

## 2023-02-10 MED ORDER — INSULIN ASPART 100 UNIT/ML IJ SOLN
0.0000 [IU] | Freq: Three times a day (TID) | INTRAMUSCULAR | Status: DC
  Administered 2023-02-10 – 2023-02-11 (×2): 2 [IU] via SUBCUTANEOUS
  Filled 2023-02-10 (×2): qty 1

## 2023-02-10 MED ORDER — PHENYTOIN SODIUM EXTENDED 100 MG PO CAPS
100.0000 mg | ORAL_CAPSULE | Freq: Once | ORAL | Status: AC
  Administered 2023-02-10: 100 mg via ORAL
  Filled 2023-02-10: qty 1

## 2023-02-10 MED ORDER — ORAL CARE MOUTH RINSE
15.0000 mL | OROMUCOSAL | Status: DC | PRN
  Administered 2023-02-10: 15 mL via OROMUCOSAL

## 2023-02-10 NOTE — Evaluation (Signed)
Clinical/Bedside Swallow Evaluation Patient Details  Name: Edward Thompson MRN: [id number] Date of Birth: 1950/09/23  Today's Date: 02/10/2023 Time: SLP Start Time (ACUTE ONLY): 1015 SLP Stop Time (ACUTE ONLY): 1105 SLP Time Calculation (min) (ACUTE ONLY): 50 min  Past Medical History:  Past Medical History:  Diagnosis Date   CAD (coronary artery disease)    Carotid stenosis, left 08/2017   Diabetes mellitus without complication (HCC)    Foot drop    Fracture of neck (HCC) 2008   fell off a roof. required halo x 4 months. also fractured alot of vertebrae   Heart attack (HCC) 2015   Heart disease    Hepatitis    6th grade    Hyperlipidemia    Hypertension    Myocardial infarction acute (HCC) 2015   Seizures (HCC)    taking phenobarbitol and dilantin. LAST SEIZURE WAS 2016. well controlled on meds   Sleep apnea    USES CPAP   Syncope 2019   Vertigo    Viral meningitis    Past Surgical History:  Past Surgical History:  Procedure Laterality Date   CARDIAC SURGERY     COLONOSCOPY     COLONOSCOPY WITH PROPOFOL N/A 03/11/2021   Procedure: COLONOSCOPY WITH PROPOFOL;  Surgeon: Toledo, Boykin Nearing, MD;  Location: [facility];  Service: Gastroenterology;  Laterality: N/A;   CORONARY ANGIOPLASTY     PATIENT UNAWARE OF THIS   CORONARY ARTERY BYPASS GRAFT  2015   ENDARTERECTOMY Left 08/19/2017   Procedure: ENDARTERECTOMY CAROTID;  Surgeon: Renford Dills, MD;  Location: [facility];  Service: Vascular;  Laterality: Left;   ESOPHAGOGASTRODUODENOSCOPY N/A 03/11/2021   Procedure: ESOPHAGOGASTRODUODENOSCOPY (EGD);  Surgeon: Toledo, Boykin Nearing, MD;  Location: [facility];  Service: Gastroenterology;  Laterality: N/A;  DM   HERNIA REPAIR Right 1985   inguinal   HPI:  Pt is a 72 year old male presenting to the ED after family found him on the floor with unknown down time, AMS, workup ongoing.  PMH significant for CAD s/p stent, seizure disorder, HTN, DM, progressive genetic  neuromuscular disease x 6 years being worked up at [facility], now wheelchair-bound with neurogenic bladder self catheterizing x 2 years with history of ESBL E. coli UTI in 2023, as well as history of OSA on CPAP, and mild cognitive impairment(MCI).   MRI: No acute intracranial abnormality.  2. Findings of Chronic microvascular ischemia and volume loss.    Assessment / Plan / Recommendation  Clinical Impression   Pt seen for BSE today. Pt was awake, verbal -- took his time during conversation. Pt answered basic questions re: self and followed 1 step commands appropriately. Pt has MCI Baseline per chart. Noted min weakness in UEs bilaterally; setup w/ tray/po's needed.  On RA, afebrile, WBC WNL.  Pt appears to present w/ functional oropharyngeal phase swallowing w/ No oropharyngeal phase dysphagia noted, No overt neuromuscular swallowing deficits noted. Pt consumed po trials w/ No overt, clinical s/s of aspiration during po intake.  Pt appears at reduced risk for aspiration following general aspiration precautions and when supported at meals for cutting/chopping foods/meats small for ease of intake(noted min weakness in Bilat. UEs).  Pt does have challenging factors that could impact oropharyngeal swallowing to include deconditioning/weakness w/ min weakness in URs impacting self-feeding abilities, progressive genetic neuromuscular disease x 6 years being worked up at [facility], now wheelchair-bound, and MCI. These factors can increase risk for aspiration, dysphagia as well as decreased oral intake overall.   During po trials,  pt consumed all consistencies feeding self w/ no overt coughing, decline in vocal quality, or change in respiratory presentation during/post trials. O2 sats remained 97% when checked. Oral phase appeared grossly [facility] w/ timely bolus management, mastication, and control of bolus propulsion for A-P transfer for swallowing. Pt is Missing some Dentition which can impact effective mastication of  solids(meats). Oral clearing achieved w/ all trial consistencies -- moistened, soft foods given.  OM Exam appeared [facility] w/ no unilateral weakness noted. Speech Clear. Pt fed self w/ full setup support.  Recommend continue a fairly Regular consistency diet w/ well-Cut/chopped meats and foods for ease, moistened foods; Thin liquids. Recommend general aspiration precautions, tray setup and support sitting up for all oral intake. Reduce distractions during oral intake, meals. Pills WHOLE in Puree for safer, easier swallowing if needed -- 1 at a time w/ water unless difficulty swallowing noted.  Education given on Pills in Puree; food consistencies and easy to eat options; general aspiration precautions to pt. NSG to reconsult if any new needs arise. MD/NSG updated, agreed. Recommend Dietician f/u for support. SLP Visit Diagnosis: Dysphagia, unspecified (R13.10)    Aspiration Risk   (reduced following general aspiration precautions)    Diet Recommendation   Thin;Age appropriate regular;Dysphagia 3 (mechanical soft) (chopped, moistened meats/foods) = continue a fairly Regular consistency diet w/ well-Cut/chopped meats and foods for ease, moistened foods; Thin liquids. Recommend general aspiration precautions, tray setup and support sitting up for all oral intake. Reduce distractions during oral intake, meals.   Medication Administration: Whole meds with puree (as needed per NSG, pt)    Other  Recommendations Recommended Consults:  (Dietician f/u) Oral Care Recommendations: Oral care BID;Staff/trained caregiver to provide oral care (SUPPORT pt)    Recommendations for follow up therapy are one component of a multi-disciplinary discharge planning process, led by the attending physician.  Recommendations may be updated based on patient status, additional functional criteria and insurance authorization.  Follow up Recommendations No SLP follow up      Assistance Recommended at Discharge  FULL for setup   Functional Status Assessment Patient has had a recent decline in their functional status and demonstrates the ability to make significant improvements in function in a reasonable and predictable amount of time.  Frequency and Duration  (n/a)   (n/a)       Prognosis Prognosis for improved oropharyngeal function: Good Barriers to Reach Goals: Cognitive deficits;Time post onset;Severity of deficits Barriers/Prognosis Comment: progressive genetic neuromuscular disease x 6 years being worked up at [facility], now wheelchair-bound      Swallow Study   General Date of Onset: 02/08/23 HPI: Pt is a 72 year old male presenting to the ED after family found him on the floor with unknown down time, AMS, workup ongoing.  PMH significant for CAD s/p stent, seizure disorder, HTN, DM, progressive genetic neuromuscular disease x 6 years being worked up at [facility], now wheelchair-bound  with neurogenic bladder self catheterizing x 2 years with history of ESBL E. coli UTI in 2023, as well as history of OSA on CPAP, and mild cognitive impairment(MCI).   MRI: No acute intracranial abnormality.  2. Findings of Chronic microvascular ischemia and volume loss. Type of Study: Bedside Swallow Evaluation Previous Swallow Assessment: none Diet Prior to this Study: Regular;Thin liquids (Level 0) Temperature Spikes Noted: No (wbc 6.4) Respiratory Status: Room air History of Recent Intubation: No Behavior/Cognition: Alert;Cooperative;Pleasant mood;Distractible;Requires cueing (MCI baseline per chart) Oral Cavity Assessment: Within Functional Limits Oral Care Completed by SLP: Yes  Oral Cavity - Dentition: Missing dentition Vision: Functional for self-feeding Self-Feeding Abilities: Able to feed self;Needs set up Patient Positioning: Upright in bed (full support) Baseline Vocal Quality: Normal Volitional Cough: Strong Volitional Swallow: Able to elicit    Oral/Motor/Sensory Function Overall Oral Motor/Sensory Function: Within  functional limits   Ice Chips Ice chips: Within functional limits Presentation: Spoon (fed 2 trials)   Thin Liquid Thin Liquid: Within functional limits Presentation: Self Fed;Straw (~4-5 ozs total)    Nectar Thick Nectar Thick Liquid: Not tested   Honey Thick Honey Thick Liquid: Not tested   Puree Puree: Within functional limits Presentation: Self Fed;Spoon (10 trials)   Solid     Solid: Within functional limits (softened solids) Presentation: Self Fed (10 trials) Other Comments: took his time w/ mastication d/t missing some Dentition         Jerilynn Som, MS, CCC-SLP Speech Language Pathologist Rehab Services; [facility] [place] [phone] (ascom) Makaiah Terwilliger 02/10/2023,4:54 PM

## 2023-02-10 NOTE — Plan of Care (Signed)
  Problem: Fluid Volume: Goal: Ability to maintain a balanced intake and output will improve Outcome: Progressing   Problem: Metabolic: Goal: Ability to maintain appropriate glucose levels will improve Outcome: Progressing   Problem: Nutritional: Goal: Maintenance of adequate nutrition will improve Outcome: Progressing   Problem: Clinical Measurements: Goal: Will remain free from infection Outcome: Progressing Goal: Respiratory complications will improve Outcome: Progressing   Problem: Nutrition: Goal: Adequate nutrition will be maintained Outcome: Progressing   Problem: Elimination: Goal: Will not experience complications related to urinary retention Outcome: Progressing   Problem: Pain Managment: Goal: General experience of comfort will improve Outcome: Progressing

## 2023-02-10 NOTE — Progress Notes (Signed)
Occupational Therapy Treatment Patient Details Name: Edward Thompson MRN: [id number] DOB: Jun 29, 1950 Today's Date: 02/10/2023   History of present illness Pt is a 72 year old male presenting to the ED after family found him on the floor with unknown down time, AMS, workup ongoing;     PMH significant for CAD s/p stent, seizure disorder, HTN, DM, progressive genetic neuromuscular disease x 6 years being worked up at [facility], now wheelchair-bound  with neurogenic bladder self catheterizing x 2 years with history of ESBL E. coli UTI in 2023, as well as history of OSA on CPAP, and mild cognitive impairment   OT comments  Mr Breech was seen for OT treatment on this date. Upon arrival to room pt reclined in bed, agreeable to tx. Pt requires MIN A don/doff gown in sitting. CGA + HHA to stand at bed and step t/f bed>chair. Pt making good progress toward goals, will continue to follow POC. Discharge recommendation remains appropriate.       If plan is discharge home, recommend the following:  A little help with walking and/or transfers;A little help with bathing/dressing/bathroom;Assistance with cooking/housework;Direct supervision/assist for financial management;Assist for transportation;Help with stairs or ramp for entrance;Direct supervision/assist for medications management   Equipment Recommendations  None recommended by OT;Other (comment)    Recommendations for Other Services      Precautions / Restrictions Precautions Precautions: Fall Restrictions Weight Bearing Restrictions: No       Mobility Bed Mobility Overal bed mobility: Needs Assistance Bed Mobility: Supine to Sit     Supine to sit: Min assist          Transfers Overall transfer level: Needs assistance Equipment used: 1 person hand held assist Transfers: Sit to/from Stand, Bed to chair/wheelchair/BSC Sit to Stand: Contact guard assist     Step pivot transfers: Contact guard assist           Balance Overall balance  assessment: Needs assistance Sitting-balance support: Feet supported Sitting balance-Leahy Scale: Good     Standing balance support: Single extremity supported, During functional activity Standing balance-Leahy Scale: Fair                             ADL either performed or assessed with clinical judgement   ADL Overall ADL's : Needs assistance/impaired                                       General ADL Comments: MIN A don/doff gown in sitting. CGA + HHA for simulated BSC t/f      Cognition Arousal: Alert Behavior During Therapy: Flat affect, WFL for tasks assessed/performed Overall Cognitive Status: No family/caregiver present to determine baseline cognitive functioning                                                     Pertinent Vitals/ Pain       Pain Assessment Pain Assessment: No/denies pain   Frequency  Min 1X/week        Progress Toward Goals  OT Goals(current goals can now be found in the care plan section)  Progress towards OT goals: Progressing toward goals  Acute Rehab OT Goals Patient Stated Goal: to go home OT Goal  Formulation: With patient Time For Goal Achievement: 02/23/23 Potential to Achieve Goals: Good ADL Goals Pt Will Perform Grooming: with modified independence;sitting Pt Will Transfer to Toilet: with modified independence Pt Will Perform Toileting - Clothing Manipulation and hygiene: with modified independence  Plan      Co-evaluation                 AM-PAC OT "6 Clicks" Daily Activity     Outcome Measure   Help from another person eating meals?: None Help from another person taking care of personal grooming?: A Little Help from another person toileting, which includes using toliet, bedpan, or urinal?: A Lot Help from another person bathing (including washing, rinsing, drying)?: A Little Help from another person to put on and taking off regular upper body clothing?: A  Little Help from another person to put on and taking off regular lower body clothing?: A Lot 6 Click Score: 17    End of Session    OT Visit Diagnosis: Other abnormalities of gait and mobility (R26.89)   Activity Tolerance Patient tolerated treatment well   Patient Left in chair;with call bell/phone within reach;with chair alarm set   Nurse Communication Mobility status        Time: 5284-1324 OT Time Calculation (min): 12 min  Charges: OT General Charges $OT Visit: 1 Visit OT Treatments $Self Care/Home Management : 8-22 mins  Kathie Dike, M.S. OTR/L  02/10/23, 4:10 PM  ascom [phone]

## 2023-02-10 NOTE — Progress Notes (Signed)
PROGRESS NOTE    Edward Thompson   ZOX:[id number] DOB: November 28, 1950  DOA: 02/08/2023 Date of Service: 02/10/23 which is hospital day 0  PCP: Sherron Monday, MD    HPI:  Edward Thompson is a 72 y.o. male with medical history significant for CAD s/p stent, seizure disorder, HTN, DM, progressive genetic neuromuscular disease x 6 years being worked up at [facility], now wheelchair-bound  with neurogenic bladder self catheterizing x 2 years with history of ESBL E. 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   Of note, recent visit w/ [facility] neurology 09/30, notes reviewed, Dr. 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   Hospital course / significant events:  10/01: to ED. Hypertensive, no significant abn labs, Trauma workup was done and included CT head C-spine, left hand, left tib-fib and pelvis and left hip all negative for acute injury. Admitted to hospitalist service for observation given possible breakthrough seizure  10/02: EEG generalized slowing. MRI brain nonacute, (+)chronic microvascular ischemia. PT/OT recs for [facility]. Neuro recs hold meds for now (phenobarbital, phenytoin) and eval for mechanism which may cause high levels despite same dose for years. Per daughter he is still not at baseline. Echo pending.  10/03: neuro recs for repeat phenobarb and phenytoin levels again tomorrow, working on confirming close outpatient f/u   Consultants:  Neurology   Procedures/Surgeries: none     ASSESSMENT & PLAN:   Altered mental status, unspecified Unwitnessed  fall at home, syncope versus unwitnessed seizure with postictal state Genetic neuromuscular disorder being worked up at [employer] etiology differential includes postictal seizure, polypharmacy/medication effect, concussion effect d/t fall w/ head trauma  Seizure w/u and medication adjustments as below  Continuous cardiac monitoring, neuro checks  Head CT was non-acute and MRI brain also nonacute, (+)chronic microvascular ischemia Aspiration precautions PT OT eval --> home health    Seizure disorder (HCC) Possible breakthrough seizure, postictal EEG --> generalized slowing  MRI brain - no concerns  Neurology following Elevated/supratherapeutic phenobarbital and phenytoin levels --> high, holding meds per neuro recs, follow levels today (improving) and tomorrow 100mg  dose of phenytoin tonight, then plan to resume at 230mg  at bedtime tomorrow.   Phenobarbital has a considerably longer half-life, plan rechecking his level again tomorrow before deciding on when to restart it. I spoke w/ his outpatient neurology clinic and was directed to [facility] Nurse Navigator Missy Altenburg, I left message 02/10/23 2:19 PM for her re: arranging patient follow-up for Monday to ideally be seen in office but at minimum to follow phenobarb/phenytoin levels.  Ativan as needed seizure Seizure precautions Neuro checks   Neuromuscular disorder [facility]) Wheelchair dependent Followed at [facility], most recently seen 02/07/2023   Neurogenic bladder, with intermittent self catheterization Given lethargy, will order every 6 hour in and out catheterization   Essential hypertension Continue home antihypertensives, amlodipine metoprolol   DM (diabetes mellitus) (HCC) Sliding scale insulin coverage   CAD s/p coronary artery stent placement Troponin normal, patient had no chest pain.  EKG showed LBBB Last saw his cardiologist Dr. Welton Flakes on  GFR: Estimated Creatinine Clearance: 83 mL/min (by C-G formula based on SCr of 0.61 mg/dL). Liver Function Tests: Recent Labs  Lab 02/09/23 2034  AST 21  ALT 14  ALKPHOS 86  BILITOT 0.8  PROT 7.6  ALBUMIN 3.9   No results for input(s): "LIPASE", "AMYLASE" in the last 168 hours. Recent Labs  Lab 02/09/23 2034  AMMONIA 11   Coagulation Profile: Recent Labs  Lab 02/09/23 2034  INR 1.2   Cardiac Enzymes: Recent Labs  Lab 02/08/23 1520  CKTOTAL 110   BNP (last 3 results) No results for input(s): "PROBNP" in the last 8760 hours. HbA1C: No results for input(s): "HGBA1C" in the last 72 hours. CBG: Recent Labs  Lab 02/09/23 2042 02/10/23 0318 02/10/23 0827 02/10/23 1149 02/10/23 1208  GLUCAP 143* 101* 98 138* 163*   Lipid Profile: No results for input(s): "CHOL", "HDL", "LDLCALC", "TRIG", "CHOLHDL", "LDLDIRECT" in the last 72 hours. Thyroid Function Tests: No results for input(s): "TSH", "T4TOTAL", "FREET4", "T3FREE", "THYROIDAB" in the last 72 hours. Anemia Panel: No results  for input(s): "VITAMINB12", "FOLATE", "FERRITIN", "TIBC", "IRON", "RETICCTPCT" in the last 72 hours. Most Recent Urinalysis On File:     Component Value Date/Time   COLORURINE YELLOW (A) 02/08/2023 1758   APPEARANCEUR CLEAR (A) 02/08/2023 1758   APPEARANCEUR Clear 08/21/2021 1046   LABSPEC 1.016 02/08/2023 1758   PHURINE 7.0 02/08/2023 1758   GLUCOSEU NEGATIVE 02/08/2023 1758   HGBUR NEGATIVE 02/08/2023 1758   BILIRUBINUR NEGATIVE 02/08/2023 1758   BILIRUBINUR 1 01/17/2023 1522   BILIRUBINUR Negative 08/21/2021 1046   KETONESUR NEGATIVE 02/08/2023 1758   PROTEINUR NEGATIVE 02/08/2023 1758   UROBILINOGEN 0.2 01/17/2023 1522   NITRITE NEGATIVE 02/08/2023 1758   LEUKOCYTESUR NEGATIVE 02/08/2023 1758   Sepsis Labs: @LABRCNTIP (procalcitonin:4,lacticidven:4) Microbiology: No results found for this or any previous visit (from the past 240 hour(s)).    Radiology Studies last 3 days: ECHOCARDIOGRAM COMPLETE  Result Date: 02/10/2023    ECHOCARDIOGRAM REPORT   Patient Name:   Edward Thompson Date of Exam: 02/09/2023 Medical Rec #:  [id number]      Height:       64.0 in Accession #:    [id number]     Weight:       193.0 lb Date of Birth:  1951-02-17     BSA:          1.927 m Patient Age:    71 years       BP:           118/71 mmHg Patient Gender: M              HR:           89 bpm. Exam Location:  [facility] Procedure: 2D Echo, Cardiac Doppler and Color Doppler Indications:     R55 Syncope  History:         Patient has prior history of Echocardiogram examinations, most                  recent 08/03/2022. CAD and Previous Myocardial Infarction,                  Signs/Symptoms:Syncope; Risk Factors:Diabetes, Hypertension,                  Dyslipidemia and Sleep Apnea.  Sonographer:     Daphine Deutscher RDCS Referring Phys:  [id number] Andris Baumann Diagnosing Phys: Julien Nordmann MD IMPRESSIONS  1. Left ventricular ejection fraction, by estimation, is 60 to 65%.  GFR: Estimated Creatinine Clearance: 83 mL/min (by C-G formula based on SCr of 0.61 mg/dL). Liver Function Tests: Recent Labs  Lab 02/09/23 2034  AST 21  ALT 14  ALKPHOS 86  BILITOT 0.8  PROT 7.6  ALBUMIN 3.9   No results for input(s): "LIPASE", "AMYLASE" in the last 168 hours. Recent Labs  Lab 02/09/23 2034  AMMONIA 11   Coagulation Profile: Recent Labs  Lab 02/09/23 2034  INR 1.2   Cardiac Enzymes: Recent Labs  Lab 02/08/23 1520  CKTOTAL 110   BNP (last 3 results) No results for input(s): "PROBNP" in the last 8760 hours. HbA1C: No results for input(s): "HGBA1C" in the last 72 hours. CBG: Recent Labs  Lab 02/09/23 2042 02/10/23 0318 02/10/23 0827 02/10/23 1149 02/10/23 1208  GLUCAP 143* 101* 98 138* 163*   Lipid Profile: No results for input(s): "CHOL", "HDL", "LDLCALC", "TRIG", "CHOLHDL", "LDLDIRECT" in the last 72 hours. Thyroid Function Tests: No results for input(s): "TSH", "T4TOTAL", "FREET4", "T3FREE", "THYROIDAB" in the last 72 hours. Anemia Panel: No results  for input(s): "VITAMINB12", "FOLATE", "FERRITIN", "TIBC", "IRON", "RETICCTPCT" in the last 72 hours. Most Recent Urinalysis On File:     Component Value Date/Time   COLORURINE YELLOW (A) 02/08/2023 1758   APPEARANCEUR CLEAR (A) 02/08/2023 1758   APPEARANCEUR Clear 08/21/2021 1046   LABSPEC 1.016 02/08/2023 1758   PHURINE 7.0 02/08/2023 1758   GLUCOSEU NEGATIVE 02/08/2023 1758   HGBUR NEGATIVE 02/08/2023 1758   BILIRUBINUR NEGATIVE 02/08/2023 1758   BILIRUBINUR 1 01/17/2023 1522   BILIRUBINUR Negative 08/21/2021 1046   KETONESUR NEGATIVE 02/08/2023 1758   PROTEINUR NEGATIVE 02/08/2023 1758   UROBILINOGEN 0.2 01/17/2023 1522   NITRITE NEGATIVE 02/08/2023 1758   LEUKOCYTESUR NEGATIVE 02/08/2023 1758   Sepsis Labs: @LABRCNTIP (procalcitonin:4,lacticidven:4) Microbiology: No results found for this or any previous visit (from the past 240 hour(s)).    Radiology Studies last 3 days: ECHOCARDIOGRAM COMPLETE  Result Date: 02/10/2023    ECHOCARDIOGRAM REPORT   Patient Name:   Edward Thompson Date of Exam: 02/09/2023 Medical Rec #:  [id number]      Height:       64.0 in Accession #:    [id number]     Weight:       193.0 lb Date of Birth:  1951-02-17     BSA:          1.927 m Patient Age:    71 years       BP:           118/71 mmHg Patient Gender: M              HR:           89 bpm. Exam Location:  [facility] Procedure: 2D Echo, Cardiac Doppler and Color Doppler Indications:     R55 Syncope  History:         Patient has prior history of Echocardiogram examinations, most                  recent 08/03/2022. CAD and Previous Myocardial Infarction,                  Signs/Symptoms:Syncope; Risk Factors:Diabetes, Hypertension,                  Dyslipidemia and Sleep Apnea.  Sonographer:     Daphine Deutscher RDCS Referring Phys:  [id number] Andris Baumann Diagnosing Phys: Julien Nordmann MD IMPRESSIONS  1. Left ventricular ejection fraction, by estimation, is 60 to 65%.  GFR: Estimated Creatinine Clearance: 83 mL/min (by C-G formula based on SCr of 0.61 mg/dL). Liver Function Tests: Recent Labs  Lab 02/09/23 2034  AST 21  ALT 14  ALKPHOS 86  BILITOT 0.8  PROT 7.6  ALBUMIN 3.9   No results for input(s): "LIPASE", "AMYLASE" in the last 168 hours. Recent Labs  Lab 02/09/23 2034  AMMONIA 11   Coagulation Profile: Recent Labs  Lab 02/09/23 2034  INR 1.2   Cardiac Enzymes: Recent Labs  Lab 02/08/23 1520  CKTOTAL 110   BNP (last 3 results) No results for input(s): "PROBNP" in the last 8760 hours. HbA1C: No results for input(s): "HGBA1C" in the last 72 hours. CBG: Recent Labs  Lab 02/09/23 2042 02/10/23 0318 02/10/23 0827 02/10/23 1149 02/10/23 1208  GLUCAP 143* 101* 98 138* 163*   Lipid Profile: No results for input(s): "CHOL", "HDL", "LDLCALC", "TRIG", "CHOLHDL", "LDLDIRECT" in the last 72 hours. Thyroid Function Tests: No results for input(s): "TSH", "T4TOTAL", "FREET4", "T3FREE", "THYROIDAB" in the last 72 hours. Anemia Panel: No results  for input(s): "VITAMINB12", "FOLATE", "FERRITIN", "TIBC", "IRON", "RETICCTPCT" in the last 72 hours. Most Recent Urinalysis On File:     Component Value Date/Time   COLORURINE YELLOW (A) 02/08/2023 1758   APPEARANCEUR CLEAR (A) 02/08/2023 1758   APPEARANCEUR Clear 08/21/2021 1046   LABSPEC 1.016 02/08/2023 1758   PHURINE 7.0 02/08/2023 1758   GLUCOSEU NEGATIVE 02/08/2023 1758   HGBUR NEGATIVE 02/08/2023 1758   BILIRUBINUR NEGATIVE 02/08/2023 1758   BILIRUBINUR 1 01/17/2023 1522   BILIRUBINUR Negative 08/21/2021 1046   KETONESUR NEGATIVE 02/08/2023 1758   PROTEINUR NEGATIVE 02/08/2023 1758   UROBILINOGEN 0.2 01/17/2023 1522   NITRITE NEGATIVE 02/08/2023 1758   LEUKOCYTESUR NEGATIVE 02/08/2023 1758   Sepsis Labs: @LABRCNTIP (procalcitonin:4,lacticidven:4) Microbiology: No results found for this or any previous visit (from the past 240 hour(s)).    Radiology Studies last 3 days: ECHOCARDIOGRAM COMPLETE  Result Date: 02/10/2023    ECHOCARDIOGRAM REPORT   Patient Name:   Edward Thompson Date of Exam: 02/09/2023 Medical Rec #:  [id number]      Height:       64.0 in Accession #:    [id number]     Weight:       193.0 lb Date of Birth:  1951-02-17     BSA:          1.927 m Patient Age:    71 years       BP:           118/71 mmHg Patient Gender: M              HR:           89 bpm. Exam Location:  [facility] Procedure: 2D Echo, Cardiac Doppler and Color Doppler Indications:     R55 Syncope  History:         Patient has prior history of Echocardiogram examinations, most                  recent 08/03/2022. CAD and Previous Myocardial Infarction,                  Signs/Symptoms:Syncope; Risk Factors:Diabetes, Hypertension,                  Dyslipidemia and Sleep Apnea.  Sonographer:     Daphine Deutscher RDCS Referring Phys:  [id number] Andris Baumann Diagnosing Phys: Julien Nordmann MD IMPRESSIONS  1. Left ventricular ejection fraction, by estimation, is 60 to 65%.  GFR: Estimated Creatinine Clearance: 83 mL/min (by C-G formula based on SCr of 0.61 mg/dL). Liver Function Tests: Recent Labs  Lab 02/09/23 2034  AST 21  ALT 14  ALKPHOS 86  BILITOT 0.8  PROT 7.6  ALBUMIN 3.9   No results for input(s): "LIPASE", "AMYLASE" in the last 168 hours. Recent Labs  Lab 02/09/23 2034  AMMONIA 11   Coagulation Profile: Recent Labs  Lab 02/09/23 2034  INR 1.2   Cardiac Enzymes: Recent Labs  Lab 02/08/23 1520  CKTOTAL 110   BNP (last 3 results) No results for input(s): "PROBNP" in the last 8760 hours. HbA1C: No results for input(s): "HGBA1C" in the last 72 hours. CBG: Recent Labs  Lab 02/09/23 2042 02/10/23 0318 02/10/23 0827 02/10/23 1149 02/10/23 1208  GLUCAP 143* 101* 98 138* 163*   Lipid Profile: No results for input(s): "CHOL", "HDL", "LDLCALC", "TRIG", "CHOLHDL", "LDLDIRECT" in the last 72 hours. Thyroid Function Tests: No results for input(s): "TSH", "T4TOTAL", "FREET4", "T3FREE", "THYROIDAB" in the last 72 hours. Anemia Panel: No results  for input(s): "VITAMINB12", "FOLATE", "FERRITIN", "TIBC", "IRON", "RETICCTPCT" in the last 72 hours. Most Recent Urinalysis On File:     Component Value Date/Time   COLORURINE YELLOW (A) 02/08/2023 1758   APPEARANCEUR CLEAR (A) 02/08/2023 1758   APPEARANCEUR Clear 08/21/2021 1046   LABSPEC 1.016 02/08/2023 1758   PHURINE 7.0 02/08/2023 1758   GLUCOSEU NEGATIVE 02/08/2023 1758   HGBUR NEGATIVE 02/08/2023 1758   BILIRUBINUR NEGATIVE 02/08/2023 1758   BILIRUBINUR 1 01/17/2023 1522   BILIRUBINUR Negative 08/21/2021 1046   KETONESUR NEGATIVE 02/08/2023 1758   PROTEINUR NEGATIVE 02/08/2023 1758   UROBILINOGEN 0.2 01/17/2023 1522   NITRITE NEGATIVE 02/08/2023 1758   LEUKOCYTESUR NEGATIVE 02/08/2023 1758   Sepsis Labs: @LABRCNTIP (procalcitonin:4,lacticidven:4) Microbiology: No results found for this or any previous visit (from the past 240 hour(s)).    Radiology Studies last 3 days: ECHOCARDIOGRAM COMPLETE  Result Date: 02/10/2023    ECHOCARDIOGRAM REPORT   Patient Name:   Edward Thompson Date of Exam: 02/09/2023 Medical Rec #:  [id number]      Height:       64.0 in Accession #:    [id number]     Weight:       193.0 lb Date of Birth:  1951-02-17     BSA:          1.927 m Patient Age:    71 years       BP:           118/71 mmHg Patient Gender: M              HR:           89 bpm. Exam Location:  [facility] Procedure: 2D Echo, Cardiac Doppler and Color Doppler Indications:     R55 Syncope  History:         Patient has prior history of Echocardiogram examinations, most                  recent 08/03/2022. CAD and Previous Myocardial Infarction,                  Signs/Symptoms:Syncope; Risk Factors:Diabetes, Hypertension,                  Dyslipidemia and Sleep Apnea.  Sonographer:     Daphine Deutscher RDCS Referring Phys:  [id number] Andris Baumann Diagnosing Phys: Julien Nordmann MD IMPRESSIONS  1. Left ventricular ejection fraction, by estimation, is 60 to 65%.  GFR: Estimated Creatinine Clearance: 83 mL/min (by C-G formula based on SCr of 0.61 mg/dL). Liver Function Tests: Recent Labs  Lab 02/09/23 2034  AST 21  ALT 14  ALKPHOS 86  BILITOT 0.8  PROT 7.6  ALBUMIN 3.9   No results for input(s): "LIPASE", "AMYLASE" in the last 168 hours. Recent Labs  Lab 02/09/23 2034  AMMONIA 11   Coagulation Profile: Recent Labs  Lab 02/09/23 2034  INR 1.2   Cardiac Enzymes: Recent Labs  Lab 02/08/23 1520  CKTOTAL 110   BNP (last 3 results) No results for input(s): "PROBNP" in the last 8760 hours. HbA1C: No results for input(s): "HGBA1C" in the last 72 hours. CBG: Recent Labs  Lab 02/09/23 2042 02/10/23 0318 02/10/23 0827 02/10/23 1149 02/10/23 1208  GLUCAP 143* 101* 98 138* 163*   Lipid Profile: No results for input(s): "CHOL", "HDL", "LDLCALC", "TRIG", "CHOLHDL", "LDLDIRECT" in the last 72 hours. Thyroid Function Tests: No results for input(s): "TSH", "T4TOTAL", "FREET4", "T3FREE", "THYROIDAB" in the last 72 hours. Anemia Panel: No results  for input(s): "VITAMINB12", "FOLATE", "FERRITIN", "TIBC", "IRON", "RETICCTPCT" in the last 72 hours. Most Recent Urinalysis On File:     Component Value Date/Time   COLORURINE YELLOW (A) 02/08/2023 1758   APPEARANCEUR CLEAR (A) 02/08/2023 1758   APPEARANCEUR Clear 08/21/2021 1046   LABSPEC 1.016 02/08/2023 1758   PHURINE 7.0 02/08/2023 1758   GLUCOSEU NEGATIVE 02/08/2023 1758   HGBUR NEGATIVE 02/08/2023 1758   BILIRUBINUR NEGATIVE 02/08/2023 1758   BILIRUBINUR 1 01/17/2023 1522   BILIRUBINUR Negative 08/21/2021 1046   KETONESUR NEGATIVE 02/08/2023 1758   PROTEINUR NEGATIVE 02/08/2023 1758   UROBILINOGEN 0.2 01/17/2023 1522   NITRITE NEGATIVE 02/08/2023 1758   LEUKOCYTESUR NEGATIVE 02/08/2023 1758   Sepsis Labs: @LABRCNTIP (procalcitonin:4,lacticidven:4) Microbiology: No results found for this or any previous visit (from the past 240 hour(s)).    Radiology Studies last 3 days: ECHOCARDIOGRAM COMPLETE  Result Date: 02/10/2023    ECHOCARDIOGRAM REPORT   Patient Name:   Edward Thompson Date of Exam: 02/09/2023 Medical Rec #:  [id number]      Height:       64.0 in Accession #:    [id number]     Weight:       193.0 lb Date of Birth:  1951-02-17     BSA:          1.927 m Patient Age:    71 years       BP:           118/71 mmHg Patient Gender: M              HR:           89 bpm. Exam Location:  [facility] Procedure: 2D Echo, Cardiac Doppler and Color Doppler Indications:     R55 Syncope  History:         Patient has prior history of Echocardiogram examinations, most                  recent 08/03/2022. CAD and Previous Myocardial Infarction,                  Signs/Symptoms:Syncope; Risk Factors:Diabetes, Hypertension,                  Dyslipidemia and Sleep Apnea.  Sonographer:     Daphine Deutscher RDCS Referring Phys:  [id number] Andris Baumann Diagnosing Phys: Julien Nordmann MD IMPRESSIONS  1. Left ventricular ejection fraction, by estimation, is 60 to 65%.

## 2023-02-10 NOTE — Progress Notes (Signed)
Both phenytoin and phenobarbital remain supratheraputic. Will give a 100mg  dose of phenytoin tonight, then plan to resume at 230mg  at bedtime tomorrow.  Phenobarbital has a considerably longer half-life, and therefore I would favor rechecking his level again tomorrow before deciding on when to restart it.  Because phenobarbital is inhibited by phenytoin, it is not clear to me if his phenobarbital dose needs to be reduced as when we reduce his Dilantin level, this may reduce the intubation on phenobarbital and increase metabolization.  Therefore, I would favor reducing his phenytoin dose and then checking frequent phenobarbital levels to ensure that it is not creeping up.  Ritta Slot, MD [facility] [phone]  If 7pm- 7am, please page neurology on call as listed in [facility].

## 2023-02-10 NOTE — Plan of Care (Signed)
Patients mentation improving today.  Up to chair. Ate will today.

## 2023-02-11 DIAGNOSIS — M79662 Pain in left lower leg: Secondary | ICD-10-CM | POA: Diagnosis not present

## 2023-02-11 DIAGNOSIS — R29898 Other symptoms and signs involving the musculoskeletal system: Secondary | ICD-10-CM | POA: Diagnosis not present

## 2023-02-11 DIAGNOSIS — E119 Type 2 diabetes mellitus without complications: Secondary | ICD-10-CM | POA: Diagnosis not present

## 2023-02-11 DIAGNOSIS — N319 Neuromuscular dysfunction of bladder, unspecified: Secondary | ICD-10-CM | POA: Diagnosis not present

## 2023-02-11 DIAGNOSIS — G40909 Epilepsy, unspecified, not intractable, without status epilepticus: Secondary | ICD-10-CM | POA: Diagnosis not present

## 2023-02-11 DIAGNOSIS — M79605 Pain in left leg: Secondary | ICD-10-CM | POA: Diagnosis not present

## 2023-02-11 DIAGNOSIS — E785 Hyperlipidemia, unspecified: Secondary | ICD-10-CM | POA: Diagnosis not present

## 2023-02-11 DIAGNOSIS — R4182 Altered mental status, unspecified: Secondary | ICD-10-CM | POA: Diagnosis not present

## 2023-02-11 DIAGNOSIS — R531 Weakness: Secondary | ICD-10-CM | POA: Diagnosis not present

## 2023-02-11 DIAGNOSIS — E1140 Type 2 diabetes mellitus with diabetic neuropathy, unspecified: Secondary | ICD-10-CM | POA: Diagnosis not present

## 2023-02-11 DIAGNOSIS — M79604 Pain in right leg: Secondary | ICD-10-CM | POA: Diagnosis not present

## 2023-02-11 DIAGNOSIS — M79661 Pain in right lower leg: Secondary | ICD-10-CM | POA: Diagnosis not present

## 2023-02-11 DIAGNOSIS — I10 Essential (primary) hypertension: Secondary | ICD-10-CM | POA: Diagnosis not present

## 2023-02-11 LAB — GLUCOSE, CAPILLARY
Glucose-Capillary: 94 mg/dL (ref 70–99)
Glucose-Capillary: 119 mg/dL — ABNORMAL HIGH (ref 70–99)
Glucose-Capillary: 126 mg/dL — ABNORMAL HIGH (ref 70–99)
Glucose-Capillary: 118 mg/dL — ABNORMAL HIGH (ref 70–99)

## 2023-02-11 LAB — PHENYTOIN LEVEL, FREE AND TOTAL
Phenytoin, Free: 2.5 ug/mL — ABNORMAL HIGH (ref 1.0–2.0)
Phenytoin, Total: 21.7 ug/mL — ABNORMAL HIGH (ref 10.0–20.0)

## 2023-02-11 LAB — PHENOBARBITAL LEVEL: Phenobarbital: 41.3 ug/mL — ABNORMAL HIGH (ref 15.0–40.0)

## 2023-02-11 LAB — PHENYTOIN LEVEL, TOTAL: Phenytoin Lvl: 20.6 ug/mL — ABNORMAL HIGH (ref 10.0–20.0)

## 2023-02-11 MED ORDER — PHENYTOIN SODIUM EXTENDED 200 MG PO CAPS: 200.0000 mg | ORAL_CAPSULE | Freq: Every day | ORAL | 0 refills | Status: DC

## 2023-02-11 MED ORDER — PHENOBARBITAL 97.2 MG PO TABS: 97.2000 mg | ORAL_TABLET | Freq: Every day | ORAL | 0 refills | Status: DC

## 2023-02-11 MED ORDER — PHENYTOIN SODIUM EXTENDED 30 MG PO CAPS: 30.0000 mg | ORAL_CAPSULE | Freq: Every day | ORAL | 0 refills | Status: DC

## 2023-02-11 MED ORDER — METOPROLOL SUCCINATE ER 25 MG PO TB24: 12.5000 mg | ORAL_TABLET | Freq: Every day | ORAL | Status: AC

## 2023-02-11 NOTE — Discharge Summary (Signed)
AMMONIA 11   CBC: Recent Labs  Lab 02/08/23 1523 02/09/23 0340  WBC 9.3 6.4  HGB 11.8* 12.3*  HCT 36.1* 37.8*  MCV 94.3 94.5  PLT 165 171   Cardiac Enzymes: Recent Labs  Lab 02/08/23 1520  CKTOTAL 110   BNP: Invalid input(s): "POCBNP" CBG: Recent Labs  Lab 02/10/23 2155 02/11/23 0626 02/11/23 0906 02/11/23 1141 02/11/23 1634  GLUCAP 110* 94 119* 126* 118*   D-Dimer No results for input(s): "DDIMER" in the last 72 hours. Hgb A1c No results for input(s): "HGBA1C" in the last 72 hours. Lipid Profile No results for input(s): "CHOL", "HDL", "LDLCALC", "TRIG", "CHOLHDL", "LDLDIRECT" in the last 72 hours. Thyroid function studies No results for input(s): "TSH", "T4TOTAL", "T3FREE", "THYROIDAB" in the last 72 hours.  Invalid input(s): "FREET3" Anemia work up No results for input(s): "VITAMINB12", "FOLATE", "FERRITIN", "TIBC", "IRON", "RETICCTPCT" in the last 72 hours. Urinalysis    Component Value Date/Time   COLORURINE YELLOW (A) 02/08/2023 1758   APPEARANCEUR CLEAR (A) 02/08/2023 1758   APPEARANCEUR Clear 08/21/2021 1046   LABSPEC 1.016 02/08/2023 1758   PHURINE 7.0 02/08/2023 1758   GLUCOSEU NEGATIVE 02/08/2023 1758   HGBUR NEGATIVE 02/08/2023 1758   BILIRUBINUR NEGATIVE 02/08/2023 1758   BILIRUBINUR 1 01/17/2023 1522   BILIRUBINUR Negative 08/21/2021 1046   KETONESUR NEGATIVE 02/08/2023 1758   PROTEINUR NEGATIVE 02/08/2023 1758   UROBILINOGEN 0.2 01/17/2023 1522   NITRITE NEGATIVE 02/08/2023 1758   LEUKOCYTESUR NEGATIVE 02/08/2023 1758   Sepsis Labs Recent Labs   Lab 02/08/23 1523 02/09/23 0340  WBC 9.3 6.4   Microbiology No results found for this or any previous visit (from the past 240 hour(s)). Imaging ECHOCARDIOGRAM COMPLETE  Result Date: 02/10/2023    ECHOCARDIOGRAM REPORT   Patient Name:   Edward Thompson Date of Exam: 02/09/2023 Medical Rec #:  [id number]      Height:       64.0 in Accession #:    [id number]     Weight:       193.0 lb Date of Birth:  07-Feb-1951     BSA:          1.927 m Patient Age:    72 years       BP:           118/71 mmHg Patient Gender: M              HR:           89 bpm. Exam Location:  [facility] Procedure: 2D Echo, Cardiac Doppler and Color Doppler Indications:     R55 Syncope  History:         Patient has prior history of Echocardiogram examinations, most                  recent 08/03/2022. CAD and Previous Myocardial Infarction,                  Signs/Symptoms:Syncope; Risk Factors:Diabetes, Hypertension,                  Dyslipidemia and Sleep Apnea.  Sonographer:     Daphine Deutscher RDCS Referring Phys:  [id number] Andris Baumann Diagnosing Phys: Julien Nordmann MD IMPRESSIONS  1. Left ventricular ejection fraction, by estimation, is 60 to 65%. The left ventricle has normal function. The left ventricle has no regional wall motion abnormalities. Left ventricular diastolic parameters are consistent with Grade I diastolic dysfunction (impaired relaxation).  2. Right ventricular systolic function is normal. The  AMMONIA 11   CBC: Recent Labs  Lab 02/08/23 1523 02/09/23 0340  WBC 9.3 6.4  HGB 11.8* 12.3*  HCT 36.1* 37.8*  MCV 94.3 94.5  PLT 165 171   Cardiac Enzymes: Recent Labs  Lab 02/08/23 1520  CKTOTAL 110   BNP: Invalid input(s): "POCBNP" CBG: Recent Labs  Lab 02/10/23 2155 02/11/23 0626 02/11/23 0906 02/11/23 1141 02/11/23 1634  GLUCAP 110* 94 119* 126* 118*   D-Dimer No results for input(s): "DDIMER" in the last 72 hours. Hgb A1c No results for input(s): "HGBA1C" in the last 72 hours. Lipid Profile No results for input(s): "CHOL", "HDL", "LDLCALC", "TRIG", "CHOLHDL", "LDLDIRECT" in the last 72 hours. Thyroid function studies No results for input(s): "TSH", "T4TOTAL", "T3FREE", "THYROIDAB" in the last 72 hours.  Invalid input(s): "FREET3" Anemia work up No results for input(s): "VITAMINB12", "FOLATE", "FERRITIN", "TIBC", "IRON", "RETICCTPCT" in the last 72 hours. Urinalysis    Component Value Date/Time   COLORURINE YELLOW (A) 02/08/2023 1758   APPEARANCEUR CLEAR (A) 02/08/2023 1758   APPEARANCEUR Clear 08/21/2021 1046   LABSPEC 1.016 02/08/2023 1758   PHURINE 7.0 02/08/2023 1758   GLUCOSEU NEGATIVE 02/08/2023 1758   HGBUR NEGATIVE 02/08/2023 1758   BILIRUBINUR NEGATIVE 02/08/2023 1758   BILIRUBINUR 1 01/17/2023 1522   BILIRUBINUR Negative 08/21/2021 1046   KETONESUR NEGATIVE 02/08/2023 1758   PROTEINUR NEGATIVE 02/08/2023 1758   UROBILINOGEN 0.2 01/17/2023 1522   NITRITE NEGATIVE 02/08/2023 1758   LEUKOCYTESUR NEGATIVE 02/08/2023 1758   Sepsis Labs Recent Labs   Lab 02/08/23 1523 02/09/23 0340  WBC 9.3 6.4   Microbiology No results found for this or any previous visit (from the past 240 hour(s)). Imaging ECHOCARDIOGRAM COMPLETE  Result Date: 02/10/2023    ECHOCARDIOGRAM REPORT   Patient Name:   Edward Thompson Date of Exam: 02/09/2023 Medical Rec #:  [id number]      Height:       64.0 in Accession #:    [id number]     Weight:       193.0 lb Date of Birth:  07-Feb-1951     BSA:          1.927 m Patient Age:    72 years       BP:           118/71 mmHg Patient Gender: M              HR:           89 bpm. Exam Location:  [facility] Procedure: 2D Echo, Cardiac Doppler and Color Doppler Indications:     R55 Syncope  History:         Patient has prior history of Echocardiogram examinations, most                  recent 08/03/2022. CAD and Previous Myocardial Infarction,                  Signs/Symptoms:Syncope; Risk Factors:Diabetes, Hypertension,                  Dyslipidemia and Sleep Apnea.  Sonographer:     Daphine Deutscher RDCS Referring Phys:  [id number] Andris Baumann Diagnosing Phys: Julien Nordmann MD IMPRESSIONS  1. Left ventricular ejection fraction, by estimation, is 60 to 65%. The left ventricle has normal function. The left ventricle has no regional wall motion abnormalities. Left ventricular diastolic parameters are consistent with Grade I diastolic dysfunction (impaired relaxation).  2. Right ventricular systolic function is normal. The  AMMONIA 11   CBC: Recent Labs  Lab 02/08/23 1523 02/09/23 0340  WBC 9.3 6.4  HGB 11.8* 12.3*  HCT 36.1* 37.8*  MCV 94.3 94.5  PLT 165 171   Cardiac Enzymes: Recent Labs  Lab 02/08/23 1520  CKTOTAL 110   BNP: Invalid input(s): "POCBNP" CBG: Recent Labs  Lab 02/10/23 2155 02/11/23 0626 02/11/23 0906 02/11/23 1141 02/11/23 1634  GLUCAP 110* 94 119* 126* 118*   D-Dimer No results for input(s): "DDIMER" in the last 72 hours. Hgb A1c No results for input(s): "HGBA1C" in the last 72 hours. Lipid Profile No results for input(s): "CHOL", "HDL", "LDLCALC", "TRIG", "CHOLHDL", "LDLDIRECT" in the last 72 hours. Thyroid function studies No results for input(s): "TSH", "T4TOTAL", "T3FREE", "THYROIDAB" in the last 72 hours.  Invalid input(s): "FREET3" Anemia work up No results for input(s): "VITAMINB12", "FOLATE", "FERRITIN", "TIBC", "IRON", "RETICCTPCT" in the last 72 hours. Urinalysis    Component Value Date/Time   COLORURINE YELLOW (A) 02/08/2023 1758   APPEARANCEUR CLEAR (A) 02/08/2023 1758   APPEARANCEUR Clear 08/21/2021 1046   LABSPEC 1.016 02/08/2023 1758   PHURINE 7.0 02/08/2023 1758   GLUCOSEU NEGATIVE 02/08/2023 1758   HGBUR NEGATIVE 02/08/2023 1758   BILIRUBINUR NEGATIVE 02/08/2023 1758   BILIRUBINUR 1 01/17/2023 1522   BILIRUBINUR Negative 08/21/2021 1046   KETONESUR NEGATIVE 02/08/2023 1758   PROTEINUR NEGATIVE 02/08/2023 1758   UROBILINOGEN 0.2 01/17/2023 1522   NITRITE NEGATIVE 02/08/2023 1758   LEUKOCYTESUR NEGATIVE 02/08/2023 1758   Sepsis Labs Recent Labs   Lab 02/08/23 1523 02/09/23 0340  WBC 9.3 6.4   Microbiology No results found for this or any previous visit (from the past 240 hour(s)). Imaging ECHOCARDIOGRAM COMPLETE  Result Date: 02/10/2023    ECHOCARDIOGRAM REPORT   Patient Name:   Edward Thompson Date of Exam: 02/09/2023 Medical Rec #:  [id number]      Height:       64.0 in Accession #:    [id number]     Weight:       193.0 lb Date of Birth:  07-Feb-1951     BSA:          1.927 m Patient Age:    72 years       BP:           118/71 mmHg Patient Gender: M              HR:           89 bpm. Exam Location:  [facility] Procedure: 2D Echo, Cardiac Doppler and Color Doppler Indications:     R55 Syncope  History:         Patient has prior history of Echocardiogram examinations, most                  recent 08/03/2022. CAD and Previous Myocardial Infarction,                  Signs/Symptoms:Syncope; Risk Factors:Diabetes, Hypertension,                  Dyslipidemia and Sleep Apnea.  Sonographer:     Daphine Deutscher RDCS Referring Phys:  [id number] Andris Baumann Diagnosing Phys: Julien Nordmann MD IMPRESSIONS  1. Left ventricular ejection fraction, by estimation, is 60 to 65%. The left ventricle has normal function. The left ventricle has no regional wall motion abnormalities. Left ventricular diastolic parameters are consistent with Grade I diastolic dysfunction (impaired relaxation).  2. Right ventricular systolic function is normal. The  Physician Discharge Summary   Patient: Edward Thompson MRN: [id number]  DOB: [birth year]   Admit:     Date of Admission: 02/08/2023 Admitted from: home   Discharge: Date of discharge: 02/11/23 Disposition: Home health Condition at discharge: fair  CODE STATUS: FULL CODE     Discharge Physician: Sunnie Nielsen, DO [facility]     PCP: Sherron Monday, MD  Recommendations for Outpatient Follow-up:  Follow up with PCP Sherron Monday, MD in 1-2 weeks Follow up with neurology [facility] asap - see below, attempt made to schedule appt/labs, office is aware via answering service  Please obtain labs/tests: phenytoin level, phenobarbital levels, and consider CBC, CMP in 3-5 days  Please follow up on the following pending results: none PCP AND OTHER OUTPATIENT PROVIDERS: SEE BELOW FOR SPECIFIC DISCHARGE INSTRUCTIONS PRINTED FOR PATIENT IN ADDITION TO GENERIC AVS PATIENT INFO    Discharge Instructions     Diet - low sodium heart healthy   Complete by: As directed    Increase activity slowly   Complete by: As directed          Discharge Diagnoses: Principal Problem:   Altered mental status, unspecified Active Problems:   Seizure disorder (HCC)   Neuromuscular disorder (HCC)   Neurogenic bladder, with intermittent self catheterization   Fall at home, initial encounter   Essential hypertension   DM (diabetes mellitus) (HCC)   CAD s/p coronary artery stent placement   OSA on CPAP   Dependence on wheelchair       Hospital Course:  HPI:  Edward Thompson is a 72 y.o. male with medical history significant for CAD s/p stent, seizure disorder, HTN, DM, progressive genetic neuromuscular disease x 6 years being worked up at [facility], now wheelchair-bound  with neurogenic bladder self catheterizing x 2 years with history of ESBL E. 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   Of note, recent visit w/ [facility] neurology 09/30, notes reviewed, Dr. 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   Hospital course / significant events:  10/01: to ED. Hypertensive, no significant abn labs, Trauma workup was done and included CT head C-spine, left hand, left tib-fib and pelvis and left hip all negative for acute injury. Admitted to hospitalist service for observation given possible breakthrough seizure  10/02: EEG generalized slowing. MRI brain nonacute, (+)chronic microvascular ischemia. PT/OT recs for [facility] Neuro recs hold meds for now (phenobarbital, phenytoin) and eval for mechanism which may cause high levels despite same dose for years. Per daughter he is still not at baseline. Echo pending.  10/03: neuro recs for repeat phenobarb and phenytoin levels again tomorrow, working on confirming close outpatient f/u  10/04: neuro recs to go home on phenytoin at 230 mg daily and phenobarbital at 97.2 mg. Plan follow up for levels this week w/ [facility] - see below. Discussed plan w/ daughter, advised she should expect call from the clinic and to call them Monday if she doesn't hear from them by lunchtime.   Consultants:  Neurology  AMMONIA 11   CBC: Recent Labs  Lab 02/08/23 1523 02/09/23 0340  WBC 9.3 6.4  HGB 11.8* 12.3*  HCT 36.1* 37.8*  MCV 94.3 94.5  PLT 165 171   Cardiac Enzymes: Recent Labs  Lab 02/08/23 1520  CKTOTAL 110   BNP: Invalid input(s): "POCBNP" CBG: Recent Labs  Lab 02/10/23 2155 02/11/23 0626 02/11/23 0906 02/11/23 1141 02/11/23 1634  GLUCAP 110* 94 119* 126* 118*   D-Dimer No results for input(s): "DDIMER" in the last 72 hours. Hgb A1c No results for input(s): "HGBA1C" in the last 72 hours. Lipid Profile No results for input(s): "CHOL", "HDL", "LDLCALC", "TRIG", "CHOLHDL", "LDLDIRECT" in the last 72 hours. Thyroid function studies No results for input(s): "TSH", "T4TOTAL", "T3FREE", "THYROIDAB" in the last 72 hours.  Invalid input(s): "FREET3" Anemia work up No results for input(s): "VITAMINB12", "FOLATE", "FERRITIN", "TIBC", "IRON", "RETICCTPCT" in the last 72 hours. Urinalysis    Component Value Date/Time   COLORURINE YELLOW (A) 02/08/2023 1758   APPEARANCEUR CLEAR (A) 02/08/2023 1758   APPEARANCEUR Clear 08/21/2021 1046   LABSPEC 1.016 02/08/2023 1758   PHURINE 7.0 02/08/2023 1758   GLUCOSEU NEGATIVE 02/08/2023 1758   HGBUR NEGATIVE 02/08/2023 1758   BILIRUBINUR NEGATIVE 02/08/2023 1758   BILIRUBINUR 1 01/17/2023 1522   BILIRUBINUR Negative 08/21/2021 1046   KETONESUR NEGATIVE 02/08/2023 1758   PROTEINUR NEGATIVE 02/08/2023 1758   UROBILINOGEN 0.2 01/17/2023 1522   NITRITE NEGATIVE 02/08/2023 1758   LEUKOCYTESUR NEGATIVE 02/08/2023 1758   Sepsis Labs Recent Labs   Lab 02/08/23 1523 02/09/23 0340  WBC 9.3 6.4   Microbiology No results found for this or any previous visit (from the past 240 hour(s)). Imaging ECHOCARDIOGRAM COMPLETE  Result Date: 02/10/2023    ECHOCARDIOGRAM REPORT   Patient Name:   Edward Thompson Date of Exam: 02/09/2023 Medical Rec #:  [id number]      Height:       64.0 in Accession #:    [id number]     Weight:       193.0 lb Date of Birth:  07-Feb-1951     BSA:          1.927 m Patient Age:    72 years       BP:           118/71 mmHg Patient Gender: M              HR:           89 bpm. Exam Location:  [facility] Procedure: 2D Echo, Cardiac Doppler and Color Doppler Indications:     R55 Syncope  History:         Patient has prior history of Echocardiogram examinations, most                  recent 08/03/2022. CAD and Previous Myocardial Infarction,                  Signs/Symptoms:Syncope; Risk Factors:Diabetes, Hypertension,                  Dyslipidemia and Sleep Apnea.  Sonographer:     Daphine Deutscher RDCS Referring Phys:  [id number] Andris Baumann Diagnosing Phys: Julien Nordmann MD IMPRESSIONS  1. Left ventricular ejection fraction, by estimation, is 60 to 65%. The left ventricle has normal function. The left ventricle has no regional wall motion abnormalities. Left ventricular diastolic parameters are consistent with Grade I diastolic dysfunction (impaired relaxation).  2. Right ventricular systolic function is normal. The  Physician Discharge Summary   Patient: Edward Thompson MRN: [id number]  DOB: [birth year]   Admit:     Date of Admission: 02/08/2023 Admitted from: home   Discharge: Date of discharge: 02/11/23 Disposition: Home health Condition at discharge: fair  CODE STATUS: FULL CODE     Discharge Physician: Sunnie Nielsen, DO [facility]     PCP: Sherron Monday, MD  Recommendations for Outpatient Follow-up:  Follow up with PCP Sherron Monday, MD in 1-2 weeks Follow up with neurology [facility] asap - see below, attempt made to schedule appt/labs, office is aware via answering service  Please obtain labs/tests: phenytoin level, phenobarbital levels, and consider CBC, CMP in 3-5 days  Please follow up on the following pending results: none PCP AND OTHER OUTPATIENT PROVIDERS: SEE BELOW FOR SPECIFIC DISCHARGE INSTRUCTIONS PRINTED FOR PATIENT IN ADDITION TO GENERIC AVS PATIENT INFO    Discharge Instructions     Diet - low sodium heart healthy   Complete by: As directed    Increase activity slowly   Complete by: As directed          Discharge Diagnoses: Principal Problem:   Altered mental status, unspecified Active Problems:   Seizure disorder (HCC)   Neuromuscular disorder (HCC)   Neurogenic bladder, with intermittent self catheterization   Fall at home, initial encounter   Essential hypertension   DM (diabetes mellitus) (HCC)   CAD s/p coronary artery stent placement   OSA on CPAP   Dependence on wheelchair       Hospital Course:  HPI:  Edward Thompson is a 72 y.o. male with medical history significant for CAD s/p stent, seizure disorder, HTN, DM, progressive genetic neuromuscular disease x 6 years being worked up at [facility], now wheelchair-bound  with neurogenic bladder self catheterizing x 2 years with history of ESBL E. 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   Of note, recent visit w/ [facility] neurology 09/30, notes reviewed, Dr. 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   Hospital course / significant events:  10/01: to ED. Hypertensive, no significant abn labs, Trauma workup was done and included CT head C-spine, left hand, left tib-fib and pelvis and left hip all negative for acute injury. Admitted to hospitalist service for observation given possible breakthrough seizure  10/02: EEG generalized slowing. MRI brain nonacute, (+)chronic microvascular ischemia. PT/OT recs for [facility] Neuro recs hold meds for now (phenobarbital, phenytoin) and eval for mechanism which may cause high levels despite same dose for years. Per daughter he is still not at baseline. Echo pending.  10/03: neuro recs for repeat phenobarb and phenytoin levels again tomorrow, working on confirming close outpatient f/u  10/04: neuro recs to go home on phenytoin at 230 mg daily and phenobarbital at 97.2 mg. Plan follow up for levels this week w/ [facility] - see below. Discussed plan w/ daughter, advised she should expect call from the clinic and to call them Monday if she doesn't hear from them by lunchtime.   Consultants:  Neurology

## 2023-02-11 NOTE — TOC Transition Note (Signed)
Transition of Care [facility]) - CM/SW Discharge Note   Patient Details  Name: Edward Thompson MRN: [id number] Date of Birth: 06-06-1950  Transition of Care [facility]) CM/SW Contact:  Truddie Hidden, RN Phone Number: 02/11/2023, 2:30 PM   Clinical Narrative:   Referral accepted by Adelina Mings from [facility] .          Patient Goals and CMS Choice      Discharge Placement                         Discharge Plan and Services Additional resources added to the After Visit Summary for                                       Social Determinants of Health (SDOH) Interventions SDOH Screenings   Food Insecurity: Patient Unable To Answer (02/09/2023)  Housing: Patient Unable To Answer (02/09/2023)  Transportation Needs: Patient Unable To Answer (02/09/2023)  Utilities: Patient Unable To Answer (02/09/2023)  Tobacco Use: Low Risk  (02/09/2023)     Readmission Risk Interventions    08/07/2021    9:31 AM  Readmission Risk Prevention Plan  Transportation Screening Complete  PCP or Specialist Appt within 5-7 Days Complete  Home Care Screening Complete  Medication Review (RN CM) Complete

## 2023-02-11 NOTE — TOC Progression Note (Addendum)
Transition of Care [facility]) - Progression Note    Patient Details  Name: Edward Thompson MRN: [id number] Date of Birth: Aug 11, 1950  Transition of Care [facility]) CM/SW Contact  Truddie Hidden, RN Phone Number: 02/11/2023, 10:11 AM  Clinical Narrative:    Referral sent and declined by [facility] due to being OON.  Referral sent to [facility] from [facility].         Expected Discharge Plan and Services                                               Social Determinants of Health (SDOH) Interventions SDOH Screenings   Food Insecurity: Patient Unable To Answer (02/09/2023)  Housing: Patient Unable To Answer (02/09/2023)  Transportation Needs: Patient Unable To Answer (02/09/2023)  Utilities: Patient Unable To Answer (02/09/2023)  Tobacco Use: Low Risk  (02/09/2023)    Readmission Risk Interventions    08/07/2021    9:31 AM  Readmission Risk Prevention Plan  Transportation Screening Complete  PCP or Specialist Appt within 5-7 Days Complete  Home Care Screening Complete  Medication Review (RN CM) Complete

## 2023-02-11 NOTE — Plan of Care (Signed)

## 2023-02-11 NOTE — Progress Notes (Signed)
Subjective: Continues to be more awake   Exam: Vitals:   02/11/23 0327 02/11/23 0900  BP: (!) 153/85 (!) 150/73  Pulse: 80 80  Resp: 18   Temp: 98.3 F (36.8 C) 98.4 F (36.9 C)  SpO2: 96%    Gen: In bed, NAD Resp: non-labored breathing, no acute distress Abd: soft, nt  Neuro: MS: awake, alert, interactive and appropriate WG:NFAO no nystagmus, VFF Motor: he is able to hold both arms without drift. Sensory:intact to LT  Pertinent Labs: PHB 41 PHT 20.8  Impression: 72 yo M who presented with sedation secondary to supratherapeutic levels of both phenytoin and phenobarbital.  Given how high his levels were, I would favor decreasing both of these.  Recommendations: 1) resume phenytoin at 230mg  daily 2) resume phenobarbital at 97.2mg  3) follow up for levels this week.   Ritta Slot, MD [facility] [phone]  If 7pm- 7am, please page neurology on call as listed in AMION.
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
## 2023-02-12 DIAGNOSIS — I10 Essential (primary) hypertension: Secondary | ICD-10-CM | POA: Diagnosis not present

## 2023-02-12 DIAGNOSIS — E119 Type 2 diabetes mellitus without complications: Secondary | ICD-10-CM | POA: Diagnosis not present

## 2023-02-12 DIAGNOSIS — R29898 Other symptoms and signs involving the musculoskeletal system: Secondary | ICD-10-CM | POA: Diagnosis not present

## 2023-02-12 DIAGNOSIS — G40909 Epilepsy, unspecified, not intractable, without status epilepticus: Secondary | ICD-10-CM | POA: Diagnosis not present

## 2023-02-12 DIAGNOSIS — R531 Weakness: Secondary | ICD-10-CM | POA: Diagnosis not present

## 2023-02-14 DIAGNOSIS — R5381 Other malaise: Secondary | ICD-10-CM | POA: Diagnosis not present

## 2023-02-14 DIAGNOSIS — M79605 Pain in left leg: Secondary | ICD-10-CM | POA: Diagnosis not present

## 2023-02-14 DIAGNOSIS — E785 Hyperlipidemia, unspecified: Secondary | ICD-10-CM | POA: Diagnosis not present

## 2023-02-14 DIAGNOSIS — M6281 Muscle weakness (generalized): Secondary | ICD-10-CM | POA: Diagnosis not present

## 2023-02-14 DIAGNOSIS — R29898 Other symptoms and signs involving the musculoskeletal system: Secondary | ICD-10-CM | POA: Diagnosis not present

## 2023-02-14 DIAGNOSIS — G609 Hereditary and idiopathic neuropathy, unspecified: Secondary | ICD-10-CM | POA: Diagnosis not present

## 2023-02-14 DIAGNOSIS — G40909 Epilepsy, unspecified, not intractable, without status epilepticus: Secondary | ICD-10-CM | POA: Diagnosis not present

## 2023-02-14 DIAGNOSIS — R339 Retention of urine, unspecified: Secondary | ICD-10-CM | POA: Diagnosis not present

## 2023-02-14 DIAGNOSIS — I2510 Atherosclerotic heart disease of native coronary artery without angina pectoris: Secondary | ICD-10-CM | POA: Diagnosis not present

## 2023-02-14 DIAGNOSIS — I10 Essential (primary) hypertension: Secondary | ICD-10-CM | POA: Diagnosis not present

## 2023-02-14 DIAGNOSIS — N319 Neuromuscular dysfunction of bladder, unspecified: Secondary | ICD-10-CM | POA: Diagnosis not present

## 2023-02-14 DIAGNOSIS — Z87440 Personal history of urinary (tract) infections: Secondary | ICD-10-CM | POA: Diagnosis not present

## 2023-02-14 DIAGNOSIS — N390 Urinary tract infection, site not specified: Secondary | ICD-10-CM | POA: Diagnosis not present

## 2023-02-14 DIAGNOSIS — R600 Localized edema: Secondary | ICD-10-CM | POA: Diagnosis not present

## 2023-02-14 DIAGNOSIS — M79604 Pain in right leg: Secondary | ICD-10-CM | POA: Diagnosis not present

## 2023-02-14 DIAGNOSIS — R531 Weakness: Secondary | ICD-10-CM | POA: Diagnosis not present

## 2023-02-14 DIAGNOSIS — E1140 Type 2 diabetes mellitus with diabetic neuropathy, unspecified: Secondary | ICD-10-CM | POA: Diagnosis not present

## 2023-02-14 DIAGNOSIS — R279 Unspecified lack of coordination: Secondary | ICD-10-CM | POA: Diagnosis not present

## 2023-02-14 DIAGNOSIS — K219 Gastro-esophageal reflux disease without esophagitis: Secondary | ICD-10-CM | POA: Diagnosis not present

## 2023-02-15 DIAGNOSIS — M6281 Muscle weakness (generalized): Secondary | ICD-10-CM | POA: Diagnosis not present

## 2023-02-16 ENCOUNTER — Telehealth: Payer: Self-pay | Admitting: Internal Medicine

## 2023-02-16 NOTE — Telephone Encounter (Signed)
Adelina Mings from [facility] left VM that the patient was d/c from [facility] on 10/4 with a home health referral but they did not place any home health orders. Would like to know if TJ would be willing to sign off on PT and OT orders for this patient. Orders can be faxed to [phone].   Please advise.

## 2023-02-18 DIAGNOSIS — G40909 Epilepsy, unspecified, not intractable, without status epilepticus: Secondary | ICD-10-CM | POA: Diagnosis not present

## 2023-02-18 DIAGNOSIS — M6281 Muscle weakness (generalized): Secondary | ICD-10-CM | POA: Diagnosis not present

## 2023-02-18 DIAGNOSIS — I2510 Atherosclerotic heart disease of native coronary artery without angina pectoris: Secondary | ICD-10-CM | POA: Diagnosis not present

## 2023-02-18 DIAGNOSIS — K219 Gastro-esophageal reflux disease without esophagitis: Secondary | ICD-10-CM | POA: Diagnosis not present

## 2023-02-22 DIAGNOSIS — I2510 Atherosclerotic heart disease of native coronary artery without angina pectoris: Secondary | ICD-10-CM | POA: Diagnosis not present

## 2023-02-22 DIAGNOSIS — R600 Localized edema: Secondary | ICD-10-CM | POA: Diagnosis not present

## 2023-02-22 DIAGNOSIS — Z87440 Personal history of urinary (tract) infections: Secondary | ICD-10-CM | POA: Diagnosis not present

## 2023-02-22 DIAGNOSIS — M6281 Muscle weakness (generalized): Secondary | ICD-10-CM | POA: Diagnosis not present

## 2023-02-24 ENCOUNTER — Encounter: Payer: Self-pay | Admitting: Physician Assistant

## 2023-02-24 ENCOUNTER — Ambulatory Visit: Payer: Medicare PPO | Admitting: Physician Assistant

## 2023-02-24 VITALS — BP 109/69 | HR 94

## 2023-02-24 DIAGNOSIS — R339 Retention of urine, unspecified: Secondary | ICD-10-CM

## 2023-02-24 NOTE — Progress Notes (Signed)
02/24/2023 11:21 AM   Edward Thompson 04-23-1951 [id number]  CC: Chief Complaint  Patient presents with   Follow-up   HPI: Edward Thompson is a 72 y.o. male with PMH urinary retention managed with CIC who presents today for annual follow-up.  He is accompanied today by his daughter, Edward Thompson, who contributes to HPI.  Today he reports no acute concerns.  He continues to perform CIC himself between every 2 and 6 hours throughout the day.  He uses an average of 6-8 catheters daily.  The [employer] standard 12 French straight tip size are working well for him.  He has had a few UTIs over the past year, managed by his PCP.  Symptoms typically include malaise and changes in his urine related to odor, consistency, or mucus.  Notably, he has been undergoing genetic testing with [facility] neurolog[facility] for evaluation of a possible inherited neurodegenerative condition.  TTR amyloidosis testing was negative.  PMH: Past Medical History:  Diagnosis Date   CAD (coronary artery disease)    Carotid stenosis, left 08/2017   Diabetes mellitus without complication (HCC)    Foot drop    Fracture of neck (HCC) 2008   fell off a roof. required halo x 4 months. also fractured alot of vertebrae   Heart attack (HCC) 2015   Heart disease    Hepatitis    6th grade    Hyperlipidemia    Hypertension    Myocardial infarction acute (HCC) 2015   Seizures (HCC)    taking phenobarbitol and dilantin. LAST SEIZURE WAS 2016. well controlled on meds   Sleep apnea    USES CPAP   Syncope 2019   Vertigo    Viral meningitis     Surgical History: Past Surgical History:  Procedure Laterality Date   CARDIAC SURGERY     COLONOSCOPY     COLONOSCOPY WITH PROPOFOL N/A 03/11/2021   Procedure: COLONOSCOPY WITH PROPOFOL;  Surgeon: Toledo, Boykin Nearing, MD;  Location: [facility] ENDOSCOPY;  Service: Gastroenterology;  Laterality: N/A;   CORONARY ANGIOPLASTY     PATIENT UNAWARE OF THIS   CORONARY ARTERY BYPASS GRAFT  2015    ENDARTERECTOMY Left 08/19/2017   Procedure: ENDARTERECTOMY CAROTID;  Surgeon: Renford Dills, MD;  Location: [facility] ORS;  Service: Vascular;  Laterality: Left;   ESOPHAGOGASTRODUODENOSCOPY N/A 03/11/2021   Procedure: ESOPHAGOGASTRODUODENOSCOPY (EGD);  Surgeon: Toledo, Boykin Nearing, MD;  Location: [facility] ENDOSCOPY;  Service: Gastroenterology;  Laterality: N/A;  DM   HERNIA REPAIR Right 1985   inguinal    Home Medications:  Allergies as of 02/24/2023   No Known Allergies      Medication List        Accurate as of February 24, 2023 11:21 AM. If you have any questions, ask your nurse or doctor.          Accu-Chek Guide test strip Generic drug: glucose blood USE WITH DIABETIC METER TO TEST BLOOD SUGAR THREE TIMES DAILY   Accu-Chek Guide w/Device Kit   Accu-Chek Softclix Lancets lancets TEST BLOOD SUGAR THREE TIMES DAILY   acetaminophen 500 MG tablet Commonly known as: TYLENOL Take 500 mg by mouth every 6 (six) hours as needed.   aspirin EC 81 MG tablet Take 81 mg by mouth at bedtime.   clopidogrel 75 MG tablet Commonly known as: PLAVIX Take 1 tablet by mouth once daily   escitalopram 10 MG tablet Commonly known as: LEXAPRO TAKE 1 TABLET EVERY MORNING   feeding supplement (GLUCERNA SHAKE) Liqd Take 237  mLs by mouth 2 (two) times daily between meals.   furosemide 20 MG tablet Commonly known as: Lasix Take 1 tablet (20 mg total) by mouth daily.   isosorbide mononitrate 30 MG 24 hr tablet Commonly known as: IMDUR Take 1 tablet (30 mg total) by mouth daily.   latanoprost 0.005 % ophthalmic solution Commonly known as: XALATAN Place 1 drop into both eyes at bedtime.   meclizine 25 MG tablet Commonly known as: ANTIVERT TAKE 1 TABLET FOUR TIMES DAILY   metFORMIN 500 MG tablet Commonly known as: GLUCOPHAGE TAKE 1 TABLET EVERY MORNING   metoprolol succinate 25 MG 24 hr tablet Commonly known as: TOPROL-XL Take 0.5 tablets (12.5 mg total) by mouth daily.    multivitamin tablet Take 1 tablet by mouth daily.   nitroGLYCERIN 0.4 MG SL tablet Commonly known as: NITROSTAT Place 0.4 mg under the tongue every 5 (five) minutes as needed for chest pain.   pantoprazole 40 MG tablet Commonly known as: PROTONIX TAKE 1 TABLET EVERY MORNING   PHENobarbital 97.2 MG tablet Commonly known as: LUMINAL Take 1 tablet (97.2 mg total) by mouth at bedtime.   phenytoin 30 MG ER capsule Commonly known as: Dilantin Take 1 capsule (30 mg total) by mouth daily. Take with 1 capsule 200 mg dose for total daily dose 230 mg   phenytoin 200 MG ER capsule Commonly known as: DILANTIN Take 1 capsule (200 mg total) by mouth daily. Take with 1 capsule 30 mg dose for total daily dose 230 mg   polyethylene glycol 17 g packet Commonly known as: MIRALAX / GLYCOLAX Take 17 g by mouth daily.   PROSTAT PO Take 30 mLs by mouth daily.   rosuvastatin 40 MG tablet Commonly known as: CRESTOR TAKE 1 TABLET AT BEDTIME   senna-docusate 8.6-50 MG tablet Commonly known as: Senokot-S Take 1 tablet by mouth at bedtime.        Allergies:  No Known Allergies  Family History: Family History  Problem Relation Age of Onset   Heart disease Mother    Heart attack Father     Social History:   reports that he has never smoked. He has never used smokeless tobacco. He reports that he does not drink alcohol and does not use drugs.  Physical Exam: BP 109/69   Pulse 94   Constitutional:  Alert and oriented, no acute distress, nontoxic appearing HEENT: [place], AT Cardiovascular: No clubbing, cyanosis, or edema Respiratory: Normal respiratory effort, no increased work of breathing Skin: No rashes, bruises or suspicious lesions Neurologic: In wheelchair Psychiatric: Normal mood and affect  Assessment & Plan:   1. Urinary retention Well-managed with CIC 6-8 times daily.  Continue Coloplast SpeediCath standard 12 French straight tip catheters.  Will see him back next year for  annual follow-up, or sooner if needed.  Return in about 1 year (around 02/24/2024) for Annual follow-up.  Carman Ching, PA-C  [facility] [place] [address] [place], [place] [address] [phone]

## 2023-02-25 DIAGNOSIS — I2510 Atherosclerotic heart disease of native coronary artery without angina pectoris: Secondary | ICD-10-CM | POA: Diagnosis not present

## 2023-02-25 DIAGNOSIS — Z87440 Personal history of urinary (tract) infections: Secondary | ICD-10-CM | POA: Diagnosis not present

## 2023-02-25 DIAGNOSIS — R600 Localized edema: Secondary | ICD-10-CM | POA: Diagnosis not present

## 2023-03-01 DIAGNOSIS — I2510 Atherosclerotic heart disease of native coronary artery without angina pectoris: Secondary | ICD-10-CM | POA: Diagnosis not present

## 2023-03-01 DIAGNOSIS — R600 Localized edema: Secondary | ICD-10-CM | POA: Diagnosis not present

## 2023-03-01 DIAGNOSIS — Z87440 Personal history of urinary (tract) infections: Secondary | ICD-10-CM | POA: Diagnosis not present

## 2023-03-01 DIAGNOSIS — M6281 Muscle weakness (generalized): Secondary | ICD-10-CM | POA: Diagnosis not present

## 2023-03-04 ENCOUNTER — Telehealth: Payer: Self-pay | Admitting: Internal Medicine

## 2023-03-04 DIAGNOSIS — G40909 Epilepsy, unspecified, not intractable, without status epilepticus: Secondary | ICD-10-CM | POA: Diagnosis not present

## 2023-03-04 DIAGNOSIS — N390 Urinary tract infection, site not specified: Secondary | ICD-10-CM | POA: Diagnosis not present

## 2023-03-04 DIAGNOSIS — I2510 Atherosclerotic heart disease of native coronary artery without angina pectoris: Secondary | ICD-10-CM | POA: Diagnosis not present

## 2023-03-04 DIAGNOSIS — E119 Type 2 diabetes mellitus without complications: Secondary | ICD-10-CM | POA: Diagnosis not present

## 2023-03-04 DIAGNOSIS — I252 Old myocardial infarction: Secondary | ICD-10-CM | POA: Diagnosis not present

## 2023-03-04 DIAGNOSIS — I10 Essential (primary) hypertension: Secondary | ICD-10-CM | POA: Diagnosis not present

## 2023-03-04 DIAGNOSIS — K219 Gastro-esophageal reflux disease without esophagitis: Secondary | ICD-10-CM | POA: Diagnosis not present

## 2023-03-04 DIAGNOSIS — E785 Hyperlipidemia, unspecified: Secondary | ICD-10-CM | POA: Diagnosis not present

## 2023-03-04 DIAGNOSIS — G4733 Obstructive sleep apnea (adult) (pediatric): Secondary | ICD-10-CM | POA: Diagnosis not present

## 2023-03-04 NOTE — Telephone Encounter (Signed)
A PT with [facility] left VM requesting PT for 1w7.   Callback # [phone]  Verbal order given to PT, Sony.

## 2023-03-07 DIAGNOSIS — K219 Gastro-esophageal reflux disease without esophagitis: Secondary | ICD-10-CM | POA: Diagnosis not present

## 2023-03-07 DIAGNOSIS — E119 Type 2 diabetes mellitus without complications: Secondary | ICD-10-CM | POA: Diagnosis not present

## 2023-03-07 DIAGNOSIS — I10 Essential (primary) hypertension: Secondary | ICD-10-CM | POA: Diagnosis not present

## 2023-03-07 DIAGNOSIS — G4733 Obstructive sleep apnea (adult) (pediatric): Secondary | ICD-10-CM | POA: Diagnosis not present

## 2023-03-07 DIAGNOSIS — I252 Old myocardial infarction: Secondary | ICD-10-CM | POA: Diagnosis not present

## 2023-03-07 DIAGNOSIS — N390 Urinary tract infection, site not specified: Secondary | ICD-10-CM | POA: Diagnosis not present

## 2023-03-07 DIAGNOSIS — E785 Hyperlipidemia, unspecified: Secondary | ICD-10-CM | POA: Diagnosis not present

## 2023-03-07 DIAGNOSIS — I2510 Atherosclerotic heart disease of native coronary artery without angina pectoris: Secondary | ICD-10-CM | POA: Diagnosis not present

## 2023-03-07 DIAGNOSIS — G40909 Epilepsy, unspecified, not intractable, without status epilepticus: Secondary | ICD-10-CM | POA: Diagnosis not present

## 2023-03-08 DIAGNOSIS — I2510 Atherosclerotic heart disease of native coronary artery without angina pectoris: Secondary | ICD-10-CM | POA: Diagnosis not present

## 2023-03-08 DIAGNOSIS — G40909 Epilepsy, unspecified, not intractable, without status epilepticus: Secondary | ICD-10-CM | POA: Diagnosis not present

## 2023-03-08 DIAGNOSIS — E785 Hyperlipidemia, unspecified: Secondary | ICD-10-CM | POA: Diagnosis not present

## 2023-03-08 DIAGNOSIS — N390 Urinary tract infection, site not specified: Secondary | ICD-10-CM | POA: Diagnosis not present

## 2023-03-08 DIAGNOSIS — G4733 Obstructive sleep apnea (adult) (pediatric): Secondary | ICD-10-CM | POA: Diagnosis not present

## 2023-03-08 DIAGNOSIS — K219 Gastro-esophageal reflux disease without esophagitis: Secondary | ICD-10-CM | POA: Diagnosis not present

## 2023-03-08 DIAGNOSIS — I10 Essential (primary) hypertension: Secondary | ICD-10-CM | POA: Diagnosis not present

## 2023-03-08 DIAGNOSIS — E119 Type 2 diabetes mellitus without complications: Secondary | ICD-10-CM | POA: Diagnosis not present

## 2023-03-08 DIAGNOSIS — I252 Old myocardial infarction: Secondary | ICD-10-CM | POA: Diagnosis not present

## 2023-03-14 DIAGNOSIS — G40909 Epilepsy, unspecified, not intractable, without status epilepticus: Secondary | ICD-10-CM | POA: Diagnosis not present

## 2023-03-14 DIAGNOSIS — N390 Urinary tract infection, site not specified: Secondary | ICD-10-CM | POA: Diagnosis not present

## 2023-03-14 DIAGNOSIS — I2510 Atherosclerotic heart disease of native coronary artery without angina pectoris: Secondary | ICD-10-CM | POA: Diagnosis not present

## 2023-03-14 DIAGNOSIS — E785 Hyperlipidemia, unspecified: Secondary | ICD-10-CM | POA: Diagnosis not present

## 2023-03-14 DIAGNOSIS — E119 Type 2 diabetes mellitus without complications: Secondary | ICD-10-CM | POA: Diagnosis not present

## 2023-03-14 DIAGNOSIS — G4733 Obstructive sleep apnea (adult) (pediatric): Secondary | ICD-10-CM | POA: Diagnosis not present

## 2023-03-14 DIAGNOSIS — I252 Old myocardial infarction: Secondary | ICD-10-CM | POA: Diagnosis not present

## 2023-03-14 DIAGNOSIS — K219 Gastro-esophageal reflux disease without esophagitis: Secondary | ICD-10-CM | POA: Diagnosis not present

## 2023-03-14 DIAGNOSIS — I10 Essential (primary) hypertension: Secondary | ICD-10-CM | POA: Diagnosis not present

## 2023-03-15 DIAGNOSIS — N390 Urinary tract infection, site not specified: Secondary | ICD-10-CM | POA: Diagnosis not present

## 2023-03-15 DIAGNOSIS — E119 Type 2 diabetes mellitus without complications: Secondary | ICD-10-CM | POA: Diagnosis not present

## 2023-03-15 DIAGNOSIS — K219 Gastro-esophageal reflux disease without esophagitis: Secondary | ICD-10-CM | POA: Diagnosis not present

## 2023-03-15 DIAGNOSIS — I252 Old myocardial infarction: Secondary | ICD-10-CM | POA: Diagnosis not present

## 2023-03-15 DIAGNOSIS — G40909 Epilepsy, unspecified, not intractable, without status epilepticus: Secondary | ICD-10-CM | POA: Diagnosis not present

## 2023-03-15 DIAGNOSIS — E785 Hyperlipidemia, unspecified: Secondary | ICD-10-CM | POA: Diagnosis not present

## 2023-03-15 DIAGNOSIS — G4733 Obstructive sleep apnea (adult) (pediatric): Secondary | ICD-10-CM | POA: Diagnosis not present

## 2023-03-15 DIAGNOSIS — I2510 Atherosclerotic heart disease of native coronary artery without angina pectoris: Secondary | ICD-10-CM | POA: Diagnosis not present

## 2023-03-15 DIAGNOSIS — I10 Essential (primary) hypertension: Secondary | ICD-10-CM | POA: Diagnosis not present

## 2023-03-20 DIAGNOSIS — I2510 Atherosclerotic heart disease of native coronary artery without angina pectoris: Secondary | ICD-10-CM | POA: Diagnosis not present

## 2023-03-20 DIAGNOSIS — E785 Hyperlipidemia, unspecified: Secondary | ICD-10-CM | POA: Diagnosis not present

## 2023-03-20 DIAGNOSIS — N390 Urinary tract infection, site not specified: Secondary | ICD-10-CM | POA: Diagnosis not present

## 2023-03-20 DIAGNOSIS — I10 Essential (primary) hypertension: Secondary | ICD-10-CM | POA: Diagnosis not present

## 2023-03-20 DIAGNOSIS — I252 Old myocardial infarction: Secondary | ICD-10-CM | POA: Diagnosis not present

## 2023-03-20 DIAGNOSIS — K219 Gastro-esophageal reflux disease without esophagitis: Secondary | ICD-10-CM | POA: Diagnosis not present

## 2023-03-20 DIAGNOSIS — G40909 Epilepsy, unspecified, not intractable, without status epilepticus: Secondary | ICD-10-CM | POA: Diagnosis not present

## 2023-03-20 DIAGNOSIS — E119 Type 2 diabetes mellitus without complications: Secondary | ICD-10-CM | POA: Diagnosis not present

## 2023-03-20 DIAGNOSIS — G4733 Obstructive sleep apnea (adult) (pediatric): Secondary | ICD-10-CM | POA: Diagnosis not present

## 2023-03-21 DIAGNOSIS — K219 Gastro-esophageal reflux disease without esophagitis: Secondary | ICD-10-CM | POA: Diagnosis not present

## 2023-03-21 DIAGNOSIS — I10 Essential (primary) hypertension: Secondary | ICD-10-CM | POA: Diagnosis not present

## 2023-03-21 DIAGNOSIS — E785 Hyperlipidemia, unspecified: Secondary | ICD-10-CM | POA: Diagnosis not present

## 2023-03-21 DIAGNOSIS — E119 Type 2 diabetes mellitus without complications: Secondary | ICD-10-CM | POA: Diagnosis not present

## 2023-03-21 DIAGNOSIS — G40909 Epilepsy, unspecified, not intractable, without status epilepticus: Secondary | ICD-10-CM | POA: Diagnosis not present

## 2023-03-21 DIAGNOSIS — G4733 Obstructive sleep apnea (adult) (pediatric): Secondary | ICD-10-CM | POA: Diagnosis not present

## 2023-03-21 DIAGNOSIS — I2510 Atherosclerotic heart disease of native coronary artery without angina pectoris: Secondary | ICD-10-CM | POA: Diagnosis not present

## 2023-03-21 DIAGNOSIS — I252 Old myocardial infarction: Secondary | ICD-10-CM | POA: Diagnosis not present

## 2023-03-21 DIAGNOSIS — N390 Urinary tract infection, site not specified: Secondary | ICD-10-CM | POA: Diagnosis not present

## 2023-03-22 ENCOUNTER — Ambulatory Visit: Payer: Medicare PPO | Admitting: Cardiovascular Disease

## 2023-03-22 ENCOUNTER — Other Ambulatory Visit: Payer: Self-pay

## 2023-03-22 ENCOUNTER — Encounter: Payer: Self-pay | Admitting: Cardiovascular Disease

## 2023-03-22 VITALS — BP 117/70 | HR 70 | Ht 64.0 in | Wt 195.0 lb

## 2023-03-22 DIAGNOSIS — R55 Syncope and collapse: Secondary | ICD-10-CM | POA: Diagnosis not present

## 2023-03-22 DIAGNOSIS — Z955 Presence of coronary angioplasty implant and graft: Secondary | ICD-10-CM

## 2023-03-22 DIAGNOSIS — E782 Mixed hyperlipidemia: Secondary | ICD-10-CM

## 2023-03-22 DIAGNOSIS — I10 Essential (primary) hypertension: Secondary | ICD-10-CM

## 2023-03-22 NOTE — Progress Notes (Signed)
Cardiology Office Note   Date:  03/22/2023   ID:  Edward Thompson 12/31/1950, MRN [id number]  PCP:  Edward Monday, MD  Cardiologist:  Adrian Blackwater, MD      History of Present Illness: Edward Thompson is a 72 y.o. male who presents for  Chief Complaint  Patient presents with   Follow-up    Had syncope and levels of seizure meds were too high and went to  [facility]. Feel fine now. Right foot swollen on lasix      Past Medical History:  Diagnosis Date   CAD (coronary artery disease)    Carotid stenosis, left 08/2017   Diabetes mellitus without complication (HCC)    Foot drop    Fracture of neck (HCC) 2008   fell off a roof. required halo x 4 months. also fractured alot of vertebrae   Heart attack (HCC) 2015   Heart disease    Hepatitis    6th grade    Hyperlipidemia    Hypertension    Myocardial infarction acute (HCC) 2015   Seizures (HCC)    taking phenobarbitol and dilantin. LAST SEIZURE WAS 2016. well controlled on meds   Sleep apnea    USES CPAP   Syncope 2019   Vertigo    Viral meningitis      Past Surgical History:  Procedure Laterality Date   CARDIAC SURGERY     COLONOSCOPY     COLONOSCOPY WITH PROPOFOL N/A 03/11/2021   Procedure: COLONOSCOPY WITH PROPOFOL;  Surgeon: Toledo, Boykin Nearing, MD;  Location: [facility] ENDOSCOPY;  Service: Gastroenterology;  Laterality: N/A;   CORONARY ANGIOPLASTY     PATIENT UNAWARE OF THIS   CORONARY ARTERY BYPASS GRAFT  2015   ENDARTERECTOMY Left 08/19/2017   Procedure: ENDARTERECTOMY CAROTID;  Surgeon: Renford Dills, MD;  Location: [facility] ORS;  Service: Vascular;  Laterality: Left;   ESOPHAGOGASTRODUODENOSCOPY N/A 03/11/2021   Procedure: ESOPHAGOGASTRODUODENOSCOPY (EGD);  Surgeon: Toledo, Boykin Nearing, MD;  Location: [facility] ENDOSCOPY;  Service: Gastroenterology;  Laterality: N/A;  DM   HERNIA REPAIR Right 1985   inguinal     Current Outpatient Medications  Medication Sig Dispense Refill   Accu-Chek Softclix Lancets  lancets TEST BLOOD SUGAR THREE TIMES DAILY 300 each 11   acetaminophen (TYLENOL) 500 MG tablet Take 500 mg by mouth every 6 (six) hours as needed.     aspirin EC 81 MG tablet Take 81 mg by mouth at bedtime.     Blood Glucose Monitoring Suppl (ACCU-CHEK GUIDE) w/Device KIT      clopidogrel (PLAVIX) 75 MG tablet Take 1 tablet by mouth once daily 90 tablet 0   escitalopram (LEXAPRO) 10 MG tablet TAKE 1 TABLET EVERY MORNING 90 tablet 3   feeding supplement, GLUCERNA SHAKE, (GLUCERNA SHAKE) LIQD Take 237 mLs by mouth 2 (two) times daily between meals. 1000 mL 12   furosemide (LASIX) 20 MG tablet Take 1 tablet (20 mg total) by mouth daily. 30 tablet 11   glucose blood (ACCU-CHEK GUIDE) test strip USE WITH DIABETIC METER TO TEST BLOOD SUGAR THREE TIMES DAILY 300 strip 3   isosorbide mononitrate (IMDUR) 30 MG 24 hr tablet Take 1 tablet (30 mg total) by mouth daily. 90 tablet 3   latanoprost (XALATAN) 0.005 % ophthalmic solution Place 1 drop into both eyes at bedtime.     meclizine (ANTIVERT) 25 MG tablet TAKE 1 TABLET FOUR TIMES DAILY 360 tablet 3   metFORMIN (GLUCOPHAGE) 500 MG tablet TAKE 1 TABLET EVERY  MORNING 90 tablet 3   metoprolol succinate (TOPROL-XL) 25 MG 24 hr tablet Take 0.5 tablets (12.5 mg total) by mouth daily.     Multiple Vitamin (MULTIVITAMIN) tablet Take 1 tablet by mouth daily.     nitroGLYCERIN (NITROSTAT) 0.4 MG SL tablet Place 0.4 mg under the tongue every 5 (five) minutes as needed for chest pain.     pantoprazole (PROTONIX) 40 MG tablet TAKE 1 TABLET EVERY MORNING 90 tablet 3   PHENobarbital (LUMINAL) 97.2 MG tablet Take 1 tablet (97.2 mg total) by mouth at bedtime. 30 tablet 0   phenytoin (DILANTIN) 200 MG ER capsule Take 1 capsule (200 mg total) by mouth daily. Take with 1 capsule 30 mg dose for total daily dose 230 mg 30 capsule 0   phenytoin (DILANTIN) 30 MG ER capsule Take 1 capsule (30 mg total) by mouth daily. Take with 1 capsule 200 mg dose for total daily dose 230 mg 30  capsule 0   Pollen Extracts (PROSTAT PO) Take 30 mLs by mouth daily.     polyethylene glycol (MIRALAX / GLYCOLAX) 17 g packet Take 17 g by mouth daily. 14 each 0   rosuvastatin (CRESTOR) 40 MG tablet TAKE 1 TABLET AT BEDTIME 90 tablet 3   senna-docusate (SENOKOT-S) 8.6-50 MG tablet Take 1 tablet by mouth at bedtime.     No current facility-administered medications for this visit.    Allergies:   Patient has no known allergies.    Social History:   reports that he has never smoked. He has never used smokeless tobacco. He reports that he does not drink alcohol and does not use drugs.   Family History:  family history includes Heart attack in his father; Heart disease in his mother.    ROS:     Review of Systems  Constitutional: Negative.   HENT: Negative.    Eyes: Negative.   Respiratory: Negative.    Gastrointestinal: Negative.   Genitourinary: Negative.   Musculoskeletal: Negative.   Skin: Negative.   Neurological: Negative.   Endo/Heme/Allergies: Negative.   Psychiatric/Behavioral: Negative.    All other systems reviewed and are negative.     All other systems are reviewed and negative.    PHYSICAL EXAM: VS:  BP 117/70   Pulse 70   Ht 5\' 4"  (1.626 m)   Wt 195 lb (88.5 kg)   SpO2 95%   BMI 33.47 kg/m  , BMI Body mass index is 33.47 kg/m. Last weight:  Wt Readings from Last 3 Encounters:  03/22/23 195 lb (88.5 kg)  02/10/23 191 lb 9.3 oz (86.9 kg)  01/24/23 193 lb (87.5 kg)     Physical Exam Vitals reviewed.  Constitutional:      Appearance: Normal appearance. He is normal weight.  HENT:     Head: Normocephalic.     Nose: Nose normal.     Mouth/Throat:     Mouth: Mucous membranes are moist.  Eyes:     Pupils: Pupils are equal, round, and reactive to light.  Cardiovascular:     Rate and Rhythm: Normal rate and regular rhythm.     Pulses: Normal pulses.     Heart sounds: Normal heart sounds.  Pulmonary:     Effort: Pulmonary effort is normal.   Abdominal:     General: Abdomen is flat. Bowel sounds are normal.  Musculoskeletal:        General: Normal range of motion.     Cervical back: Normal range of motion.  Skin:  General: Skin is warm.  Neurological:     General: No focal deficit present.     Mental Status: He is alert.  Psychiatric:        Mood and Affect: Mood normal.       EKG:   Recent Labs: 02/08/2023: BUN 18; Potassium 3.7; Sodium 139 02/09/2023: ALT 14; Creatinine, Ser 0.61; Hemoglobin 12.3; Platelets 171    Lipid Panel    Component Value Date/Time   CHOL 138 01/14/2023 0911   CHOL 117 09/23/2013 0132   TRIG 50 01/14/2023 0911   TRIG 64 09/23/2013 0132   HDL 57 01/14/2023 0911   HDL 49 09/23/2013 0132   CHOLHDL 2.4 01/14/2023 0911   CHOLHDL 3.5 02/06/2016 0419   VLDL 28 02/06/2016 0419   VLDL 13 09/23/2013 0132   LDLCALC 70 01/14/2023 0911   LDLCALC 55 09/23/2013 0132      Other studies Reviewed: Additional studies/ records that were reviewed today include:  Review of the above records demonstrates:       No data to display            ASSESSMENT AND PLAN:    ICD-10-CM   1. CAD s/p coronary artery stent placement  Z95.5    feeling fine    2. Mixed hyperlipidemia  E78.2     3. Essential hypertension  I10     4. Syncope, unspecified syncope type  R55    resolved       Problem List Items Addressed This Visit       Cardiovascular and Mediastinum   Essential hypertension     Other   Syncope   Hyperlipidemia   CAD s/p coronary artery stent placement - Primary       Disposition:   Return in about 3 months (around 06/22/2023).    Total time spent: 30 minutes  Signed,  Adrian Blackwater, MD  03/22/2023 2:19 PM    [facility]

## 2023-03-23 DIAGNOSIS — G4733 Obstructive sleep apnea (adult) (pediatric): Secondary | ICD-10-CM | POA: Diagnosis not present

## 2023-03-23 DIAGNOSIS — I2510 Atherosclerotic heart disease of native coronary artery without angina pectoris: Secondary | ICD-10-CM | POA: Diagnosis not present

## 2023-03-23 DIAGNOSIS — I252 Old myocardial infarction: Secondary | ICD-10-CM | POA: Diagnosis not present

## 2023-03-23 DIAGNOSIS — K219 Gastro-esophageal reflux disease without esophagitis: Secondary | ICD-10-CM | POA: Diagnosis not present

## 2023-03-23 DIAGNOSIS — E785 Hyperlipidemia, unspecified: Secondary | ICD-10-CM | POA: Diagnosis not present

## 2023-03-23 DIAGNOSIS — N390 Urinary tract infection, site not specified: Secondary | ICD-10-CM | POA: Diagnosis not present

## 2023-03-23 DIAGNOSIS — E119 Type 2 diabetes mellitus without complications: Secondary | ICD-10-CM | POA: Diagnosis not present

## 2023-03-23 DIAGNOSIS — G40909 Epilepsy, unspecified, not intractable, without status epilepticus: Secondary | ICD-10-CM | POA: Diagnosis not present

## 2023-03-23 DIAGNOSIS — I10 Essential (primary) hypertension: Secondary | ICD-10-CM | POA: Diagnosis not present

## 2023-03-28 DIAGNOSIS — I252 Old myocardial infarction: Secondary | ICD-10-CM | POA: Diagnosis not present

## 2023-03-28 DIAGNOSIS — I10 Essential (primary) hypertension: Secondary | ICD-10-CM | POA: Diagnosis not present

## 2023-03-28 DIAGNOSIS — E119 Type 2 diabetes mellitus without complications: Secondary | ICD-10-CM | POA: Diagnosis not present

## 2023-03-28 DIAGNOSIS — K219 Gastro-esophageal reflux disease without esophagitis: Secondary | ICD-10-CM | POA: Diagnosis not present

## 2023-03-28 DIAGNOSIS — E785 Hyperlipidemia, unspecified: Secondary | ICD-10-CM | POA: Diagnosis not present

## 2023-03-28 DIAGNOSIS — I2510 Atherosclerotic heart disease of native coronary artery without angina pectoris: Secondary | ICD-10-CM | POA: Diagnosis not present

## 2023-03-28 DIAGNOSIS — N390 Urinary tract infection, site not specified: Secondary | ICD-10-CM | POA: Diagnosis not present

## 2023-03-28 DIAGNOSIS — G4733 Obstructive sleep apnea (adult) (pediatric): Secondary | ICD-10-CM | POA: Diagnosis not present

## 2023-03-28 DIAGNOSIS — G40909 Epilepsy, unspecified, not intractable, without status epilepticus: Secondary | ICD-10-CM | POA: Diagnosis not present

## 2023-03-30 DIAGNOSIS — I10 Essential (primary) hypertension: Secondary | ICD-10-CM | POA: Diagnosis not present

## 2023-03-30 DIAGNOSIS — E785 Hyperlipidemia, unspecified: Secondary | ICD-10-CM | POA: Diagnosis not present

## 2023-03-30 DIAGNOSIS — N390 Urinary tract infection, site not specified: Secondary | ICD-10-CM | POA: Diagnosis not present

## 2023-03-30 DIAGNOSIS — G40909 Epilepsy, unspecified, not intractable, without status epilepticus: Secondary | ICD-10-CM | POA: Diagnosis not present

## 2023-03-30 DIAGNOSIS — G4733 Obstructive sleep apnea (adult) (pediatric): Secondary | ICD-10-CM | POA: Diagnosis not present

## 2023-03-30 DIAGNOSIS — E119 Type 2 diabetes mellitus without complications: Secondary | ICD-10-CM | POA: Diagnosis not present

## 2023-03-30 DIAGNOSIS — K219 Gastro-esophageal reflux disease without esophagitis: Secondary | ICD-10-CM | POA: Diagnosis not present

## 2023-03-30 DIAGNOSIS — I2510 Atherosclerotic heart disease of native coronary artery without angina pectoris: Secondary | ICD-10-CM | POA: Diagnosis not present

## 2023-03-30 DIAGNOSIS — I252 Old myocardial infarction: Secondary | ICD-10-CM | POA: Diagnosis not present

## 2023-04-01 DIAGNOSIS — E119 Type 2 diabetes mellitus without complications: Secondary | ICD-10-CM | POA: Diagnosis not present

## 2023-04-01 DIAGNOSIS — I2510 Atherosclerotic heart disease of native coronary artery without angina pectoris: Secondary | ICD-10-CM | POA: Diagnosis not present

## 2023-04-01 DIAGNOSIS — K219 Gastro-esophageal reflux disease without esophagitis: Secondary | ICD-10-CM | POA: Diagnosis not present

## 2023-04-01 DIAGNOSIS — I10 Essential (primary) hypertension: Secondary | ICD-10-CM | POA: Diagnosis not present

## 2023-04-01 DIAGNOSIS — E785 Hyperlipidemia, unspecified: Secondary | ICD-10-CM | POA: Diagnosis not present

## 2023-04-01 DIAGNOSIS — I252 Old myocardial infarction: Secondary | ICD-10-CM | POA: Diagnosis not present

## 2023-04-01 DIAGNOSIS — G40909 Epilepsy, unspecified, not intractable, without status epilepticus: Secondary | ICD-10-CM | POA: Diagnosis not present

## 2023-04-01 DIAGNOSIS — N390 Urinary tract infection, site not specified: Secondary | ICD-10-CM | POA: Diagnosis not present

## 2023-04-01 DIAGNOSIS — G4733 Obstructive sleep apnea (adult) (pediatric): Secondary | ICD-10-CM | POA: Diagnosis not present

## 2023-04-05 DIAGNOSIS — G4733 Obstructive sleep apnea (adult) (pediatric): Secondary | ICD-10-CM | POA: Diagnosis not present

## 2023-04-05 DIAGNOSIS — N390 Urinary tract infection, site not specified: Secondary | ICD-10-CM | POA: Diagnosis not present

## 2023-04-05 DIAGNOSIS — E785 Hyperlipidemia, unspecified: Secondary | ICD-10-CM | POA: Diagnosis not present

## 2023-04-05 DIAGNOSIS — K219 Gastro-esophageal reflux disease without esophagitis: Secondary | ICD-10-CM | POA: Diagnosis not present

## 2023-04-05 DIAGNOSIS — I252 Old myocardial infarction: Secondary | ICD-10-CM | POA: Diagnosis not present

## 2023-04-05 DIAGNOSIS — I2510 Atherosclerotic heart disease of native coronary artery without angina pectoris: Secondary | ICD-10-CM | POA: Diagnosis not present

## 2023-04-05 DIAGNOSIS — I10 Essential (primary) hypertension: Secondary | ICD-10-CM | POA: Diagnosis not present

## 2023-04-05 DIAGNOSIS — E119 Type 2 diabetes mellitus without complications: Secondary | ICD-10-CM | POA: Diagnosis not present

## 2023-04-05 DIAGNOSIS — G40909 Epilepsy, unspecified, not intractable, without status epilepticus: Secondary | ICD-10-CM | POA: Diagnosis not present

## 2023-04-11 DIAGNOSIS — E785 Hyperlipidemia, unspecified: Secondary | ICD-10-CM | POA: Diagnosis not present

## 2023-04-11 DIAGNOSIS — G4733 Obstructive sleep apnea (adult) (pediatric): Secondary | ICD-10-CM | POA: Diagnosis not present

## 2023-04-11 DIAGNOSIS — N390 Urinary tract infection, site not specified: Secondary | ICD-10-CM | POA: Diagnosis not present

## 2023-04-11 DIAGNOSIS — I2510 Atherosclerotic heart disease of native coronary artery without angina pectoris: Secondary | ICD-10-CM | POA: Diagnosis not present

## 2023-04-11 DIAGNOSIS — G40909 Epilepsy, unspecified, not intractable, without status epilepticus: Secondary | ICD-10-CM | POA: Diagnosis not present

## 2023-04-11 DIAGNOSIS — I10 Essential (primary) hypertension: Secondary | ICD-10-CM | POA: Diagnosis not present

## 2023-04-11 DIAGNOSIS — I252 Old myocardial infarction: Secondary | ICD-10-CM | POA: Diagnosis not present

## 2023-04-11 DIAGNOSIS — K219 Gastro-esophageal reflux disease without esophagitis: Secondary | ICD-10-CM | POA: Diagnosis not present

## 2023-04-11 DIAGNOSIS — E119 Type 2 diabetes mellitus without complications: Secondary | ICD-10-CM | POA: Diagnosis not present

## 2023-04-13 ENCOUNTER — Telehealth: Payer: Self-pay

## 2023-04-13 NOTE — Telephone Encounter (Signed)
Patient is coming in on 12/5 for blood work and his daughter thinks he has a UTI. He is going to give [place] a urine sample.

## 2023-04-14 ENCOUNTER — Other Ambulatory Visit: Payer: Medicare PPO

## 2023-04-14 ENCOUNTER — Ambulatory Visit: Payer: Medicare PPO | Admitting: Cardiology

## 2023-04-14 DIAGNOSIS — R350 Frequency of micturition: Secondary | ICD-10-CM

## 2023-04-14 DIAGNOSIS — E782 Mixed hyperlipidemia: Secondary | ICD-10-CM | POA: Diagnosis not present

## 2023-04-14 DIAGNOSIS — E119 Type 2 diabetes mellitus without complications: Secondary | ICD-10-CM | POA: Diagnosis not present

## 2023-04-14 LAB — POCT URINALYSIS DIPSTICK
Bilirubin, UA: NEGATIVE
Blood, UA: NEGATIVE
Glucose, UA: NEGATIVE
Ketones, UA: NEGATIVE
Leukocytes, UA: NEGATIVE
Nitrite, UA: NEGATIVE
Protein, UA: NEGATIVE
Spec Grav, UA: 1.01 (ref 1.010–1.025)
Urobilinogen, UA: 0.2 U/dL
pH, UA: 7.5 (ref 5.0–8.0)

## 2023-04-14 NOTE — Progress Notes (Signed)
Patient notified

## 2023-04-15 LAB — LIPID PANEL
Chol/HDL Ratio: 2.1 {ratio} (ref 0.0–5.0)
Cholesterol, Total: 136 mg/dL (ref 100–199)
HDL: 66 mg/dL (ref >39)
LDL Chol Calc (NIH): 58 mg/dL (ref 0–99)
Triglycerides: 54 mg/dL (ref 0–149)
VLDL Cholesterol Cal: 12 mg/dL (ref 5–40)

## 2023-04-15 LAB — HEMOGLOBIN A1C
Est. average glucose Bld gHb Est-mCnc: 120 mg/dL
Hgb A1c MFr Bld: 5.8 % — ABNORMAL HIGH (ref 4.8–5.6)

## 2023-04-17 ENCOUNTER — Other Ambulatory Visit: Payer: Self-pay | Admitting: Cardiovascular Disease

## 2023-04-18 ENCOUNTER — Encounter: Payer: Self-pay | Admitting: Internal Medicine

## 2023-04-18 ENCOUNTER — Ambulatory Visit (INDEPENDENT_AMBULATORY_CARE_PROVIDER_SITE_OTHER): Payer: Medicare PPO | Admitting: Internal Medicine

## 2023-04-18 VITALS — BP 136/84 | HR 76 | Ht 64.0 in | Wt 195.0 lb

## 2023-04-18 DIAGNOSIS — R42 Dizziness and giddiness: Secondary | ICD-10-CM | POA: Diagnosis not present

## 2023-04-18 DIAGNOSIS — E119 Type 2 diabetes mellitus without complications: Secondary | ICD-10-CM

## 2023-04-18 DIAGNOSIS — G40909 Epilepsy, unspecified, not intractable, without status epilepticus: Secondary | ICD-10-CM | POA: Diagnosis not present

## 2023-04-18 LAB — POCT CBG (FASTING - GLUCOSE)-MANUAL ENTRY: Glucose Fasting, POC: 105 mg/dL — AB (ref 70–99)

## 2023-04-18 MED ORDER — PHENOBARBITAL 97.2 MG PO TABS: 97.2000 mg | ORAL_TABLET | Freq: Every day | ORAL | 1 refills | Status: DC

## 2023-04-18 MED ORDER — PHENYTOIN SODIUM EXTENDED 30 MG PO CAPS: 30.0000 mg | ORAL_CAPSULE | Freq: Every day | ORAL | 1 refills | Status: DC

## 2023-04-18 MED ORDER — MECLIZINE HCL 25 MG PO TABS: 25.0000 mg | ORAL_TABLET | Freq: Four times a day (QID) | ORAL | 3 refills | Status: AC

## 2023-04-18 NOTE — Progress Notes (Signed)
Established Patient Office Visit  Subjective:  Patient ID: Edward Thompson, male    DOB: April 04, 1951  Age: 72 y.o. MRN: [id number]  Chief Complaint  Patient presents with   Follow-up    No new complaints, here for lab review and medication refills. Recently hospitalized for AMS due to elevated anticonvulsant levels. Discharged to SNF for further rehab and now at home. Ambulating now, independent of ADLs with normal appetite.  Labs reviewed and notable for well controlled diabetes, A1c at target, lipids also at target. Denies any hypoglycemic episodes and home bg readings have been at target.   No other concerns at this time.   Past Medical History:  Diagnosis Date   CAD (coronary artery disease)    Carotid stenosis, left 08/2017   Diabetes mellitus without complication (HCC)    Foot drop    Fracture of neck (HCC) 2008   fell off a roof. required halo x 4 months. also fractured alot of vertebrae   Heart attack (HCC) 2015   Heart disease    Hepatitis    6th grade    Hyperlipidemia    Hypertension    Myocardial infarction acute (HCC) 2015   Seizures (HCC)    taking phenobarbitol and dilantin. LAST SEIZURE WAS 2016. well controlled on meds   Sleep apnea    USES CPAP   Syncope 2019   Vertigo    Viral meningitis     Past Surgical History:  Procedure Laterality Date   CARDIAC SURGERY     COLONOSCOPY     COLONOSCOPY WITH PROPOFOL N/A 03/11/2021   Procedure: COLONOSCOPY WITH PROPOFOL;  Surgeon: Toledo, Boykin Nearing, MD;  Location: [facility] ENDOSCOPY;  Service: Gastroenterology;  Laterality: N/A;   CORONARY ANGIOPLASTY     PATIENT UNAWARE OF THIS   CORONARY ARTERY BYPASS GRAFT  2015   ENDARTERECTOMY Left 08/19/2017   Procedure: ENDARTERECTOMY CAROTID;  Surgeon: Renford Dills, MD;  Location: [facility] ORS;  Service: Vascular;  Laterality: Left;   ESOPHAGOGASTRODUODENOSCOPY N/A 03/11/2021   Procedure: ESOPHAGOGASTRODUODENOSCOPY (EGD);  Surgeon: Toledo, Boykin Nearing, MD;  Location: [facility]  ENDOSCOPY;  Service: Gastroenterology;  Laterality: N/A;  DM   HERNIA REPAIR Right 1985   inguinal    Social History   Socioeconomic History   Marital status: Widowed    Spouse name: Not on file   Number of children: Not on file   Years of education: Not on file   Highest education level: Not on file  Occupational History   Not on file  Tobacco Use   Smoking status: Never   Smokeless tobacco: Never  Vaping Use   Vaping status: Never Used  Substance and Sexual Activity   Alcohol use: No   Drug use: No   Sexual activity: Not Currently  Other Topics Concern   Not on file  Social History Narrative   Not on file   Social Determinants of Health   Financial Resource Strain: Low Risk  (02/12/2023)   Received [facility]   Overall Financial Resource Strain (CARDIA)    Difficulty of Paying Living Expenses: Not very hard  Food Insecurity: No Food Insecurity (02/12/2023)   Received from [facility]   Hunger Vital Sign    Worried About Running Out of Food in the Last Year: Never true    Ran Out of Food in the Last Year: Never true  Transportation Needs: No Transportation Needs (02/12/2023)   Received from [facility] - Transportation  Lack of Transportation (Medical): No    Lack of Transportation (Non-Medical): No  Physical Activity: Not on file  Stress: Not on file  Social Connections: Not on file  Intimate Partner Violence: Patient Unable To Answer (02/09/2023)   Humiliation, Afraid, Rape, and Kick questionnaire    Fear of Current or Ex-Partner: Patient unable to answer    Emotionally Abused: Patient unable to answer    Physically Abused: Patient unable to answer    Sexually Abused: Patient unable to answer    Family History  Problem Relation Age of Onset   Heart disease Mother    Heart attack Father     No Known Allergies  Outpatient Medications Prior to Visit  Medication Sig   Accu-Chek Softclix Lancets lancets TEST BLOOD SUGAR THREE  TIMES DAILY   acetaminophen (TYLENOL) 500 MG tablet Take 500 mg by mouth every 6 (six) hours as needed.   aspirin EC 81 MG tablet Take 81 mg by mouth at bedtime.   Blood Glucose Monitoring Suppl (ACCU-CHEK GUIDE) w/Device KIT    clopidogrel (PLAVIX) 75 MG tablet Take 1 tablet by mouth once daily   escitalopram (LEXAPRO) 10 MG tablet TAKE 1 TABLET EVERY MORNING   feeding supplement, GLUCERNA SHAKE, (GLUCERNA SHAKE) LIQD Take 237 mLs by mouth 2 (two) times daily between meals.   furosemide (LASIX) 20 MG tablet Take 1 tablet (20 mg total) by mouth daily.   glucose blood (ACCU-CHEK GUIDE) test strip USE WITH DIABETIC METER TO TEST BLOOD SUGAR THREE TIMES DAILY   isosorbide mononitrate (IMDUR) 30 MG 24 hr tablet TAKE 1 TABLET EVERY DAY   latanoprost (XALATAN) 0.005 % ophthalmic solution Place 1 drop into both eyes at bedtime.   metFORMIN (GLUCOPHAGE) 500 MG tablet TAKE 1 TABLET EVERY MORNING   metoprolol succinate (TOPROL-XL) 25 MG 24 hr tablet Take 0.5 tablets (12.5 mg total) by mouth daily.   Multiple Vitamin (MULTIVITAMIN) tablet Take 1 tablet by mouth daily.   nitroGLYCERIN (NITROSTAT) 0.4 MG SL tablet Place 0.4 mg under the tongue every 5 (five) minutes as needed for chest pain.   pantoprazole (PROTONIX) 40 MG tablet TAKE 1 TABLET EVERY MORNING   phenytoin (DILANTIN) 200 MG ER capsule Take 1 capsule (200 mg total) by mouth daily. Take with 1 capsule 30 mg dose for total daily dose 230 mg   Pollen Extracts (PROSTAT PO) Take 30 mLs by mouth daily.   polyethylene glycol (MIRALAX / GLYCOLAX) 17 g packet Take 17 g by mouth daily.   rosuvastatin (CRESTOR) 40 MG tablet TAKE 1 TABLET AT BEDTIME   senna-docusate (SENOKOT-Taisley Mordan) 8.6-50 MG tablet Take 1 tablet by mouth at bedtime.   [DISCONTINUED] meclizine (ANTIVERT) 25 MG tablet TAKE 1 TABLET FOUR TIMES DAILY   [DISCONTINUED] PHENobarbital (LUMINAL) 97.2 MG tablet Take 1 tablet (97.2 mg total) by mouth at bedtime.   [DISCONTINUED] phenytoin (DILANTIN) 30  MG ER capsule Take 1 capsule (30 mg total) by mouth daily. Take with 1 capsule 200 mg dose for total daily dose 230 mg   No facility-administered medications prior to visit.    Review of Systems  Constitutional: Negative.   HENT: Negative.    Eyes: Negative.   Respiratory: Negative.    Gastrointestinal: Negative.   Genitourinary: Negative.   Musculoskeletal: Negative.   Skin: Negative.   Neurological: Negative.   Endo/Heme/Allergies: Negative.   Psychiatric/Behavioral: Negative.    All other systems reviewed and are negative.      Objective:   BP 136/84   Pulse  76   Ht 5\' 4"  (1.626 m)   Wt 195 lb (88.5 kg)   SpO2 98%   BMI 33.47 kg/m   Vitals:   04/18/23 1459  BP: 136/84  Pulse: 76  Height: 5\' 4"  (1.626 m)  Weight: 195 lb (88.5 kg)  SpO2: 98%  BMI (Calculated): 33.46    Physical Exam Vitals reviewed.  Constitutional:      Appearance: Normal appearance. He is normal weight.  HENT:     Head: Normocephalic.     Nose: Nose normal.     Mouth/Throat:     Mouth: Mucous membranes are moist.  Eyes:     Pupils: Pupils are equal, round, and reactive to light.  Cardiovascular:     Rate and Rhythm: Normal rate and regular rhythm.     Pulses: Normal pulses.     Heart sounds: Normal heart sounds.  Pulmonary:     Effort: Pulmonary effort is normal.  Abdominal:     General: Abdomen is flat. Bowel sounds are normal.  Musculoskeletal:        General: Normal range of motion.     Cervical back: Normal range of motion.     Right lower leg: 2+ Edema present.     Left lower leg: 2+ Edema present.  Skin:    General: Skin is warm.  Neurological:     General: No focal deficit present.     Mental Status: He is alert.  Psychiatric:        Mood and Affect: Mood normal.      Results for orders placed or performed in visit on 04/18/23  POCT CBG (Fasting - Glucose)  Result Value Ref Range   Glucose Fasting, POC 105 (A) 70 - 99 mg/dL    Recent Results (from the past  2160 hour(Antonius Hartlage))  CK     Status: None   Collection Time: 02/08/23  3:20 PM  Result Value Ref Range   Total CK 110 49 - 397 U/L    Comment: Performed at [facility], [address] [place], [place] [address]  Basic metabolic panel     Status: Abnormal   Collection Time: 02/08/23  3:23 PM  Result Value Ref Range   Sodium 139 135 - 145 mmol/L   Potassium 3.7 3.5 - 5.1 mmol/L   Chloride 103 98 - 111 mmol/L   CO2 25 22 - 32 mmol/L   Glucose, Bld 116 (H) 70 - 99 mg/dL    Comment: Glucose reference range applies only to samples taken after fasting for at least 8 hours.   BUN 18 8 - 23 mg/dL   Creatinine, Ser 1.91 0.61 - 1.24 mg/dL   Calcium 8.9 8.9 - 47.8 mg/dL   GFR, Estimated >29 >56 mL/min    Comment: (NOTE) Calculated using the CKD-EPI Creatinine Equation (2021)    Anion gap 11 5 - 15    Comment: Performed at [facility], [address] [place], [place] [address]  CBC     Status: Abnormal   Collection Time: 02/08/23  3:23 PM  Result Value Ref Range   WBC 9.3 4.0 - 10.5 K/uL   RBC 3.83 (L) 4.22 - 5.81 MIL/uL   Hemoglobin 11.8 (L) 13.0 - 17.0 g/dL   HCT 65.7 (L) 84.6 - 96.2 %   MCV 94.3 80.0 - 100.0 fL   MCH 30.8 26.0 - 34.0 pg   MCHC 32.7 30.0 - 36.0 g/dL   RDW 95.2 84.1 - 32.4 %  Platelets 165 150 - 400 K/uL   nRBC 0.0 0.0 - 0.2 %    Comment: Performed at [facility], [address] [place], [place] [address]  Troponin I (High Sensitivity)     Status: None   Collection Time: 02/08/23  5:30 PM  Result Value Ref Range   Troponin I (High Sensitivity) <2 <18 ng/L    Comment: (NOTE) Elevated high sensitivity troponin I (hsTnI) values and significant  changes across serial measurements may suggest ACS but many other  chronic and acute conditions are known to elevate hsTnI results.  Refer to the "Links" section for chest pain algorithms and additional  guidance. Performed at [facility], [address] [place], [place] [address]    Urinalysis, Routine w reflex microscopic -Urine, Random     Status: Abnormal   Collection Time: 02/08/23  5:58 PM  Result Value Ref Range   Color, Urine YELLOW (A) YELLOW   APPearance CLEAR (A) CLEAR   Specific Gravity, Urine 1.016 1.005 - 1.030   pH 7.0 5.0 - 8.0   Glucose, UA NEGATIVE NEGATIVE mg/dL   Hgb urine dipstick NEGATIVE NEGATIVE   Bilirubin Urine NEGATIVE NEGATIVE   Ketones, ur NEGATIVE NEGATIVE mg/dL   Protein, ur NEGATIVE NEGATIVE mg/dL   Nitrite NEGATIVE NEGATIVE   Leukocytes,Ua NEGATIVE NEGATIVE    Comment: Performed at [facility], [address] [place], [place] [address]  Phenytoin level, free and total     Status: Abnormal   Collection Time: 02/09/23  3:40 AM  Result Value Ref Range   Phenytoin, Free 2.5 (H) 1.0 - 2.0 ug/mL    Comment: (NOTE)                                Detection Limit = 0.5 Performed At: [facility] [address] [place], [place] [id number] Jolene Schimke MD YS:[id number]    Phenytoin, Total 21.7 (H) 10.0 - 20.0 ug/mL    Comment: (NOTE)                                Detection Limit =  0.8                          <0.8 Indicates None Detected Patient drug level exceeds published reference range.  Evaluate clinically for signs of potential toxicity.   Phenobarbital level     Status: Abnormal   Collection Time: 02/09/23  3:40 AM  Result Value Ref Range   Phenobarbital 51.9 (H) 15.0 - 40.0 ug/mL    Comment: Performed at [facility], [address], [place], [place] [address]  CBC     Status: Abnormal   Collection Time: 02/09/23  3:40 AM  Result Value Ref Range   WBC 6.4 4.0 - 10.5 K/uL   RBC 4.00 (L) 4.22 - 5.81 MIL/uL   Hemoglobin 12.3 (L) 13.0 - 17.0 g/dL   HCT 73.2 (L) 20.2 - 54.2 %   MCV 94.5 80.0 - 100.0 fL   MCH 30.8 26.0 - 34.0 pg   MCHC 32.5 30.0 - 36.0 g/dL   RDW 70.6 23.7 - 62.8 %   Platelets 171 150 - 400 K/uL   nRBC 0.0 0.0 - 0.2 %    Comment: Performed at [facility], [address], [place], [place] [address]  Creatinine, serum     Status: None   Collection Time: 02/09/23  3:40 AM  Result Value Ref Range   Creatinine, Ser 0.61 0.61 - 1.24 mg/dL   GFR, Estimated >64 >40 mL/min    Comment: (NOTE) Calculated using the CKD-EPI Creatinine Equation (2021) Performed at [facility], [address] [place], [place] [address]   CBG monitoring, ED     Status: Abnormal   Collection Time: 02/09/23  6:31 AM  Result Value Ref Range   Glucose-Capillary 108 (H) 70 - 99 mg/dL    Comment: Glucose reference range applies only to samples taken after fasting for at least 8 hours.  CBG monitoring, ED     Status: Abnormal   Collection Time: 02/09/23 10:09 AM  Result Value Ref Range   Glucose-Capillary 120 (H) 70 - 99 mg/dL    Comment: Glucose reference range applies only to samples taken after fasting for at least 8 hours.  Phenytoin level, total     Status: Abnormal   Collection Time: 02/09/23 10:24 AM  Result Value Ref Range   Phenytoin Lvl 28.1 (H) 10.0 - 20.0 ug/mL    Comment: Performed at [facility], [address] [place], [place] [address]  CBG monitoring, ED     Status: Abnormal   Collection Time: 02/09/23 12:12 PM  Result Value Ref Range   Glucose-Capillary 104 (H) 70 - 99 mg/dL    Comment: Glucose reference range applies only to samples taken after fasting for at least 8 hours.  CBG monitoring, ED     Status: None   Collection Time: 02/09/23  4:03 PM  Result Value Ref Range   Glucose-Capillary 98 70 - 99 mg/dL    Comment: Glucose reference range applies only to samples taken after fasting for at least 8 hours.  ECHOCARDIOGRAM COMPLETE     Status: None   Collection Time: 02/09/23  8:12 PM  Result Value Ref Range   Weight 3,088 oz   Height 64 in   BP 127/74 mmHg   Laysa Kimmey' Lateral 2.90 cm   Area-P 1/2 4.40 cm2   Est EF 60 - 65%   Ammonia     Status: None   Collection Time: 02/09/23  8:34 PM  Result Value Ref Range   Ammonia 11 9 -  35 umol/L    Comment: Performed at [facility], [address] [place], [place] [address]  Hepatic function panel     Status: None   Collection Time: 02/09/23  8:34 PM  Result Value Ref Range   Total Protein 7.6 6.5 - 8.1 g/dL   Albumin 3.9 3.5 - 5.0 g/dL   AST 21 15 - 41 U/L   ALT 14 0 - 44 U/L   Alkaline Phosphatase 86 38 - 126 U/L   Total Bilirubin 0.8 0.3 - 1.2 mg/dL   Bilirubin, Direct 0.1 0.0 - 0.2 mg/dL   Indirect Bilirubin 0.7 0.3 - 0.9 mg/dL    Comment: Performed at [facility], [address] [place], [place] [address]  Protime-INR     Status: Abnormal   Collection Time: 02/09/23  8:34 PM  Result Value Ref Range   Prothrombin Time 15.6 (H) 11.4 - 15.2 seconds   INR 1.2 0.8 - 1.2    Comment: (NOTE) INR goal varies based on device and disease states. Performed at [facility], [address], [place], [place] [address]   CBG monitoring, ED     Status: Abnormal   Collection  Time: 02/09/23  8:38 PM  Result Value Ref Range   Glucose-Capillary 161 (H) 70 - 99 mg/dL    Comment: Glucose reference range applies only to samples taken after fasting for at least 8 hours.  CBG monitoring, ED     Status: Abnormal   Collection Time: 02/09/23  8:42 PM  Result Value Ref Range   Glucose-Capillary 143 (H) 70 - 99 mg/dL    Comment: Glucose reference range applies only to samples taken after fasting for at least 8 hours.  Glucose, capillary     Status: Abnormal   Collection Time: 02/10/23  3:18 AM  Result Value Ref Range   Glucose-Capillary 101 (H) 70 - 99 mg/dL    Comment: Glucose reference range applies only to samples taken after fasting for at least 8 hours.  Phenytoin level, total     Status: Abnormal   Collection Time: 02/10/23  6:26 AM  Result Value Ref Range   Phenytoin Lvl 21.8 (H) 10.0 - 20.0 ug/mL    Comment: Performed at [facility], [address] [place], [place] [address]  Phenobarbital level     Status: Abnormal    Collection Time: 02/10/23  6:26 AM  Result Value Ref Range   Phenobarbital 47.5 (H) 15.0 - 40.0 ug/mL    Comment: Performed at [facility], [address] [place], [place] [address]  Glucose, capillary     Status: None   Collection Time: 02/10/23  8:27 AM  Result Value Ref Range   Glucose-Capillary 98 70 - 99 mg/dL    Comment: Glucose reference range applies only to samples taken after fasting for at least 8 hours.  Glucose, capillary     Status: Abnormal   Collection Time: 02/10/23 11:49 AM  Result Value Ref Range   Glucose-Capillary 138 (H) 70 - 99 mg/dL    Comment: Glucose reference range applies only to samples taken after fasting for at least 8 hours.  Glucose, capillary     Status: Abnormal   Collection Time: 02/10/23 12:08 PM  Result Value Ref Range   Glucose-Capillary 163 (H) 70 - 99 mg/dL    Comment: Glucose reference range applies only to samples taken after fasting for at least 8 hours.  Glucose, capillary     Status: Abnormal   Collection Time: 02/10/23  4:20 PM  Result Value Ref Range   Glucose-Capillary 142 (H) 70 - 99 mg/dL    Comment: Glucose reference range applies only to samples taken after fasting for at least 8 hours.  Glucose, capillary     Status: Abnormal   Collection Time: 02/10/23  9:55 PM  Result Value Ref Range   Glucose-Capillary 110 (H) 70 - 99 mg/dL    Comment: Glucose reference range applies only to samples taken after fasting for at least 8 hours.  Glucose, capillary     Status: None   Collection Time: 02/11/23  6:26 AM  Result Value Ref Range   Glucose-Capillary 94 70 - 99 mg/dL    Comment: Glucose reference range applies only to samples taken after fasting for at least 8 hours.  Phenytoin level, total     Status: Abnormal   Collection Time: 02/11/23  6:34 AM  Result Value Ref Range   Phenytoin Lvl 20.6 (H) 10.0 - 20.0 ug/mL    Comment: Performed at [facility], [address] [place], [place] [address]  Phenobarbital level      Status: Abnormal   Collection Time: 02/11/23  6:34 AM  Result Value Ref Range   Phenobarbital 41.3 (H) 15.0 - 40.0 ug/mL    Comment: Performed at [facility], [address] [place], [place] [address]  Glucose, capillary     Status: Abnormal   Collection Time: 02/11/23  9:06 AM  Result Value Ref Range   Glucose-Capillary 119 (H) 70 - 99 mg/dL    Comment: Glucose reference range applies only to samples taken after fasting for at least 8 hours.  Glucose, capillary     Status: Abnormal   Collection Time: 02/11/23 11:41 AM  Result Value Ref Range   Glucose-Capillary 126 (H) 70 - 99 mg/dL    Comment: Glucose reference range applies only to samples taken after fasting for at least 8 hours.  Glucose, capillary     Status: Abnormal   Collection Time: 02/11/23  4:34 PM  Result Value Ref Range   Glucose-Capillary 118 (H) 70 - 99 mg/dL    Comment: Glucose reference range applies only to samples taken after fasting for at least 8 hours.  Lipid panel     Status: None   Collection Time: 04/14/23  9:15 AM  Result Value Ref Range   Cholesterol, Total 136 100 - 199 mg/dL   Triglycerides 54 0 - 149 mg/dL   HDL 66 >84 mg/dL   VLDL Cholesterol Cal 12 5 - 40 mg/dL   LDL Chol Calc (NIH) 58 0 - 99 mg/dL   Chol/HDL Ratio 2.1 0.0 - 5.0 ratio    Comment:                                   T. Chol/HDL Ratio                                             Men  Women                               1/2 Avg.Risk  3.4    3.3                                   Avg.Risk  5.0    4.4                                2X Avg.Risk  9.6    7.1                                3X Avg.Risk 23.4   11.0   Hemoglobin A1c     Status: Abnormal   Collection Time: 04/14/23  9:15 AM  Result Value Ref Range   Hgb A1c MFr Bld 5.8 (H) 4.8 - 5.6 %    Comment:          Prediabetes: 5.7 - 6.4          Diabetes: >6.4          Glycemic control for adults with diabetes: <7.0    Est. average glucose Bld gHb Est-mCnc 120 mg/dL   POCT Urinalysis Dipstick ([address])     Status: None  Collection Time: 04/14/23  9:39 AM  Result Value Ref Range   Color, UA light yellow    Clarity, UA clear    Glucose, UA Negative Negative   Bilirubin, UA neg    Ketones, UA neg    Spec Grav, UA 1.010 1.010 - 1.025   Blood, UA neg    pH, UA 7.5 5.0 - 8.0   Protein, UA Negative Negative   Urobilinogen, UA 0.2 0.2 or 1.0 E.U./dL   Nitrite, UA neg    Leukocytes, UA Negative Negative   Appearance clear    Odor yes   POCT CBG (Fasting - Glucose)     Status: Abnormal   Collection Time: 04/18/23  3:06 PM  Result Value Ref Range   Glucose Fasting, POC 105 (A) 70 - 99 mg/dL      Assessment & Plan:  As per problem list  Problem List Items Addressed This Visit       Nervous and Auditory   Seizure disorder (HCC)   Relevant Medications   PHENobarbital (LUMINAL) 97.2 MG tablet   phenytoin (DILANTIN) 30 MG ER capsule   Other Visit Diagnoses     Diabetes mellitus without complication (HCC)    -  Primary   Relevant Orders   POCT CBG (Fasting - Glucose) (Completed)   Vertigo       Relevant Medications   meclizine (ANTIVERT) 25 MG tablet       Return in about 3 months (around 07/17/2023) for fu with labs prior.   Total time spent: 20 minutes  Luna Fuse, MD  04/18/2023   This document may have been prepared by [facility] Voice Recognition software and as such may include unintentional dictation errors.

## 2023-04-27 DIAGNOSIS — R339 Retention of urine, unspecified: Secondary | ICD-10-CM | POA: Diagnosis not present

## 2023-05-11 DIAGNOSIS — I639 Cerebral infarction, unspecified: Secondary | ICD-10-CM

## 2023-05-11 HISTORY — DX: Cerebral infarction, unspecified: I63.9
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
## 2023-06-13 DIAGNOSIS — R768 Other specified abnormal immunological findings in serum: Secondary | ICD-10-CM | POA: Diagnosis not present

## 2023-06-13 DIAGNOSIS — R531 Weakness: Secondary | ICD-10-CM | POA: Diagnosis not present

## 2023-06-13 DIAGNOSIS — G9340 Encephalopathy, unspecified: Secondary | ICD-10-CM | POA: Diagnosis not present

## 2023-06-13 DIAGNOSIS — I2510 Atherosclerotic heart disease of native coronary artery without angina pectoris: Secondary | ICD-10-CM | POA: Diagnosis not present

## 2023-06-13 DIAGNOSIS — G4733 Obstructive sleep apnea (adult) (pediatric): Secondary | ICD-10-CM | POA: Diagnosis not present

## 2023-06-13 DIAGNOSIS — G40909 Epilepsy, unspecified, not intractable, without status epilepticus: Secondary | ICD-10-CM | POA: Diagnosis not present

## 2023-06-13 DIAGNOSIS — G629 Polyneuropathy, unspecified: Secondary | ICD-10-CM | POA: Diagnosis not present

## 2023-06-13 DIAGNOSIS — R600 Localized edema: Secondary | ICD-10-CM | POA: Diagnosis not present

## 2023-06-13 DIAGNOSIS — I10 Essential (primary) hypertension: Secondary | ICD-10-CM | POA: Diagnosis not present

## 2023-06-23 ENCOUNTER — Ambulatory Visit: Payer: Medicare PPO | Admitting: Cardiovascular Disease

## 2023-06-23 ENCOUNTER — Encounter: Payer: Self-pay | Admitting: Cardiovascular Disease

## 2023-06-23 VITALS — BP 112/68 | HR 73 | Ht 64.0 in | Wt 195.0 lb

## 2023-06-23 DIAGNOSIS — Z955 Presence of coronary angioplasty implant and graft: Secondary | ICD-10-CM

## 2023-06-23 DIAGNOSIS — G4733 Obstructive sleep apnea (adult) (pediatric): Secondary | ICD-10-CM | POA: Diagnosis not present

## 2023-06-23 DIAGNOSIS — I10 Essential (primary) hypertension: Secondary | ICD-10-CM

## 2023-06-23 DIAGNOSIS — R0602 Shortness of breath: Secondary | ICD-10-CM | POA: Diagnosis not present

## 2023-06-23 DIAGNOSIS — I6522 Occlusion and stenosis of left carotid artery: Secondary | ICD-10-CM | POA: Diagnosis not present

## 2023-06-23 NOTE — Progress Notes (Signed)
Cardiology Office Note   Date:  06/23/2023   ID:  Thompson, Edward 1951/02/25, MRN [id number]  PCP:  Sherron Monday, MD  Cardiologist:  Adrian Blackwater, MD      History of Present Illness: Edward Thompson is a 73 y.o. male who presents for  Chief Complaint  Patient presents with   Follow-up    3 month follow up     Doing well, les SOB      Past Medical History:  Diagnosis Date   CAD (coronary artery disease)    Carotid stenosis, left 08/2017   Diabetes mellitus without complication (HCC)    Foot drop    Fracture of neck (HCC) 2008   fell off a roof. required halo x 4 months. also fractured alot of vertebrae   Heart attack (HCC) 2015   Heart disease    Hepatitis    6th grade    Hyperlipidemia    Hypertension    Myocardial infarction acute (HCC) 2015   Seizures (HCC)    taking phenobarbitol and dilantin. LAST SEIZURE WAS 2016. well controlled on meds   Sleep apnea    USES CPAP   Syncope 2019   Vertigo    Viral meningitis      Past Surgical History:  Procedure Laterality Date   CARDIAC SURGERY     COLONOSCOPY     COLONOSCOPY WITH PROPOFOL N/A 03/11/2021   Procedure: COLONOSCOPY WITH PROPOFOL;  Surgeon: Toledo, Boykin Nearing, MD;  Location: [facility];  Service: Gastroenterology;  Laterality: N/A;   CORONARY ANGIOPLASTY     PATIENT UNAWARE OF THIS   CORONARY ARTERY BYPASS GRAFT  2015   ENDARTERECTOMY Left 08/19/2017   Procedure: ENDARTERECTOMY CAROTID;  Surgeon: Renford Dills, MD;  Location: [facility];  Service: Vascular;  Laterality: Left;   ESOPHAGOGASTRODUODENOSCOPY N/A 03/11/2021   Procedure: ESOPHAGOGASTRODUODENOSCOPY (EGD);  Surgeon: Toledo, Boykin Nearing, MD;  Location: [facility];  Service: Gastroenterology;  Laterality: N/A;  DM   HERNIA REPAIR Right 1985   inguinal     Current Outpatient Medications  Medication Sig Dispense Refill   Accu-Chek Softclix Lancets lancets TEST BLOOD SUGAR THREE TIMES DAILY 300 each 11   acetaminophen  (TYLENOL) 500 MG tablet Take 500 mg by mouth every 6 (six) hours as needed.     aspirin EC 81 MG tablet Take 81 mg by mouth at bedtime.     Blood Glucose Monitoring Suppl (ACCU-CHEK GUIDE) w/Device KIT      clopidogrel (PLAVIX) 75 MG tablet Take 1 tablet by mouth once daily 90 tablet 0   escitalopram (LEXAPRO) 10 MG tablet TAKE 1 TABLET EVERY MORNING 90 tablet 3   feeding supplement, GLUCERNA SHAKE, (GLUCERNA SHAKE) LIQD Take 237 mLs by mouth 2 (two) times daily between meals. 1000 mL 12   furosemide (LASIX) 20 MG tablet Take 1 tablet (20 mg total) by mouth daily. 30 tablet 11   glucose blood (ACCU-CHEK GUIDE) test strip USE WITH DIABETIC METER TO TEST BLOOD SUGAR THREE TIMES DAILY 300 strip 3   isosorbide mononitrate (IMDUR) 30 MG 24 hr tablet TAKE 1 TABLET EVERY DAY 90 tablet 3   latanoprost (XALATAN) 0.005 % ophthalmic solution Place 1 drop into both eyes at bedtime.     meclizine (ANTIVERT) 25 MG tablet Take 1 tablet (25 mg total) by mouth in the morning, at noon, in the evening, and at bedtime. TAKE 1 TABLET FOUR TIMES DAILY 360 tablet 3   metFORMIN (GLUCOPHAGE) 500 MG tablet  TAKE 1 TABLET EVERY MORNING 90 tablet 3   metoprolol succinate (TOPROL-XL) 25 MG 24 hr tablet Take 0.5 tablets (12.5 mg total) by mouth daily.     Multiple Vitamin (MULTIVITAMIN) tablet Take 1 tablet by mouth daily.     nitroGLYCERIN (NITROSTAT) 0.4 MG SL tablet Place 0.4 mg under the tongue every 5 (five) minutes as needed for chest pain.     pantoprazole (PROTONIX) 40 MG tablet TAKE 1 TABLET EVERY MORNING 90 tablet 3   PHENobarbital (LUMINAL) 97.2 MG tablet Take 1 tablet (97.2 mg total) by mouth at bedtime. 90 tablet 1   phenytoin (DILANTIN) 200 MG ER capsule Take 1 capsule (200 mg total) by mouth daily. Take with 1 capsule 30 mg dose for total daily dose 230 mg 30 capsule 0   phenytoin (DILANTIN) 30 MG ER capsule Take 1 capsule (30 mg total) by mouth daily. Take with 1 capsule 200 mg dose for total daily dose 230 mg 90  capsule 1   Pollen Extracts (PROSTAT PO) Take 30 mLs by mouth daily.     polyethylene glycol (MIRALAX / GLYCOLAX) 17 g packet Take 17 g by mouth daily. 14 each 0   rosuvastatin (CRESTOR) 40 MG tablet TAKE 1 TABLET AT BEDTIME 90 tablet 3   senna-docusate (SENOKOT-S) 8.6-50 MG tablet Take 1 tablet by mouth at bedtime.     No current facility-administered medications for this visit.    Allergies:   Patient has no known allergies.    Social History:   reports that he has never smoked. He has never used smokeless tobacco. He reports that he does not drink alcohol and does not use drugs.   Family History:  family history includes Heart attack in his father; Heart disease in his mother.    ROS:     Review of Systems  Constitutional: Negative.   HENT: Negative.    Eyes: Negative.   Respiratory: Negative.    Gastrointestinal: Negative.   Genitourinary: Negative.   Musculoskeletal: Negative.   Skin: Negative.   Neurological: Negative.   Endo/Heme/Allergies: Negative.   Psychiatric/Behavioral: Negative.    All other systems reviewed and are negative.     All other systems are reviewed and negative.    PHYSICAL EXAM: VS:  BP 112/68   Pulse 73   Ht 5\' 4"  (1.626 m)   Wt 195 lb (88.5 kg)   SpO2 94%   BMI 33.47 kg/m  , BMI Body mass index is 33.47 kg/m. Last weight:  Wt Readings from Last 3 Encounters:  06/23/23 195 lb (88.5 kg)  04/18/23 195 lb (88.5 kg)  03/22/23 195 lb (88.5 kg)     Physical Exam Vitals reviewed.  Constitutional:      Appearance: Normal appearance. He is normal weight.  HENT:     Head: Normocephalic.     Nose: Nose normal.     Mouth/Throat:     Mouth: Mucous membranes are moist.  Eyes:     Pupils: Pupils are equal, round, and reactive to light.  Cardiovascular:     Rate and Rhythm: Normal rate and regular rhythm.     Pulses: Normal pulses.     Heart sounds: Normal heart sounds.  Pulmonary:     Effort: Pulmonary effort is normal.  Abdominal:      General: Abdomen is flat. Bowel sounds are normal.  Musculoskeletal:        General: Normal range of motion.     Cervical back: Normal range of motion.  Skin:    General: Skin is warm.  Neurological:     General: No focal deficit present.     Mental Status: He is alert.  Psychiatric:        Mood and Affect: Mood normal.       EKG:   Recent Labs: 02/08/2023: BUN 18; Potassium 3.7; Sodium 139 02/09/2023: ALT 14; Creatinine, Ser 0.61; Hemoglobin 12.3; Platelets 171    Lipid Panel    Component Value Date/Time   CHOL 136 04/14/2023 0915   CHOL 117 09/23/2013 0132   TRIG 54 04/14/2023 0915   TRIG 64 09/23/2013 0132   HDL 66 04/14/2023 0915   HDL 49 09/23/2013 0132   CHOLHDL 2.1 04/14/2023 0915   CHOLHDL 3.5 02/06/2016 0419   VLDL 28 02/06/2016 0419   VLDL 13 09/23/2013 0132   LDLCALC 58 04/14/2023 0915   LDLCALC 55 09/23/2013 0132      Other studies Reviewed: Additional studies/ records that were reviewed today include:  Review of the above records demonstrates:       No data to display            ASSESSMENT AND PLAN:    ICD-10-CM   1. Stenosis of left carotid artery  I65.22 MYOCARDIAL PERFUSION IMAGING    2. Essential hypertension  I10 MYOCARDIAL PERFUSION IMAGING    3. Symptomatic stenosis of left carotid artery  I65.22 MYOCARDIAL PERFUSION IMAGING    4. OSA on CPAP  G47.33 MYOCARDIAL PERFUSION IMAGING    5. Primary hypertension  I10 MYOCARDIAL PERFUSION IMAGING    6. CAD s/p coronary artery stent placement  Z95.5 MYOCARDIAL PERFUSION IMAGING   Had no stress test for a while advise stress test as had PCI    7. Shortness of breath  R06.02 MYOCARDIAL PERFUSION IMAGING   less sob, but needs  new mask for cpap       Problem List Items Addressed This Visit       Cardiovascular and Mediastinum   Carotid stenosis - Primary   Relevant Orders   MYOCARDIAL PERFUSION IMAGING   Essential hypertension   Relevant Orders   MYOCARDIAL PERFUSION  IMAGING   Symptomatic carotid artery stenosis   Relevant Orders   MYOCARDIAL PERFUSION IMAGING     Respiratory   OSA on CPAP   Relevant Orders   MYOCARDIAL PERFUSION IMAGING     Other   CAD s/p coronary artery stent placement   Relevant Orders   MYOCARDIAL PERFUSION IMAGING   Other Visit Diagnoses       Primary hypertension       Relevant Orders   MYOCARDIAL PERFUSION IMAGING     Shortness of breath       less sob, but needs  new mask for cpap   Relevant Orders   MYOCARDIAL PERFUSION IMAGING          Disposition:   Return in about 5 weeks (around 07/28/2023) for stress test and f/u.    Total time spent: 30 minutes  Signed,  Adrian Blackwater, MD  06/23/2023 2:16 PM    [facility]

## 2023-07-11 DIAGNOSIS — G629 Polyneuropathy, unspecified: Secondary | ICD-10-CM | POA: Diagnosis not present

## 2023-07-18 ENCOUNTER — Ambulatory Visit (INDEPENDENT_AMBULATORY_CARE_PROVIDER_SITE_OTHER): Payer: Medicare PPO

## 2023-07-18 DIAGNOSIS — I6522 Occlusion and stenosis of left carotid artery: Secondary | ICD-10-CM | POA: Diagnosis not present

## 2023-07-18 DIAGNOSIS — R0602 Shortness of breath: Secondary | ICD-10-CM

## 2023-07-18 DIAGNOSIS — I10 Essential (primary) hypertension: Secondary | ICD-10-CM

## 2023-07-18 DIAGNOSIS — G4733 Obstructive sleep apnea (adult) (pediatric): Secondary | ICD-10-CM

## 2023-07-18 DIAGNOSIS — Z955 Presence of coronary angioplasty implant and graft: Secondary | ICD-10-CM | POA: Diagnosis not present

## 2023-07-18 MED ORDER — TECHNETIUM TC 99M SESTAMIBI GENERIC - CARDIOLITE
30.9000 | Freq: Once | INTRAVENOUS | Status: AC | PRN
  Administered 2023-07-18: 30.9 via INTRAVENOUS

## 2023-07-18 MED ORDER — TECHNETIUM TC 99M SESTAMIBI GENERIC - CARDIOLITE
10.5000 | Freq: Once | INTRAVENOUS | Status: AC | PRN
  Administered 2023-07-18: 10.5 via INTRAVENOUS

## 2023-07-21 DIAGNOSIS — R339 Retention of urine, unspecified: Secondary | ICD-10-CM | POA: Diagnosis not present

## 2023-07-22 ENCOUNTER — Other Ambulatory Visit

## 2023-07-22 DIAGNOSIS — E119 Type 2 diabetes mellitus without complications: Secondary | ICD-10-CM

## 2023-07-23 LAB — HEMOGLOBIN A1C
Est. average glucose Bld gHb Est-mCnc: 117 mg/dL
Hgb A1c MFr Bld: 5.7 % — ABNORMAL HIGH (ref 4.8–5.6)

## 2023-07-26 ENCOUNTER — Encounter: Payer: Self-pay | Admitting: Internal Medicine

## 2023-07-26 ENCOUNTER — Ambulatory Visit: Payer: Medicare PPO | Admitting: Internal Medicine

## 2023-07-26 VITALS — BP 110/73 | HR 67 | Ht 64.0 in | Wt 195.0 lb

## 2023-07-26 DIAGNOSIS — I10 Essential (primary) hypertension: Secondary | ICD-10-CM | POA: Diagnosis not present

## 2023-07-26 DIAGNOSIS — R350 Frequency of micturition: Secondary | ICD-10-CM

## 2023-07-26 DIAGNOSIS — N3000 Acute cystitis without hematuria: Secondary | ICD-10-CM | POA: Diagnosis not present

## 2023-07-26 DIAGNOSIS — E119 Type 2 diabetes mellitus without complications: Secondary | ICD-10-CM

## 2023-07-26 DIAGNOSIS — G40909 Epilepsy, unspecified, not intractable, without status epilepticus: Secondary | ICD-10-CM | POA: Diagnosis not present

## 2023-07-26 DIAGNOSIS — N3090 Cystitis, unspecified without hematuria: Secondary | ICD-10-CM | POA: Insufficient documentation

## 2023-07-26 LAB — POCT URINALYSIS DIPSTICK
Bilirubin, UA: NEGATIVE
Blood, UA: NEGATIVE
Glucose, UA: NEGATIVE
Ketones, UA: NEGATIVE
Protein, UA: NEGATIVE
Spec Grav, UA: 1.015 (ref 1.010–1.025)
Urobilinogen, UA: 0.2 U/dL
pH, UA: 6 (ref 5.0–8.0)

## 2023-07-26 LAB — POCT CBG (FASTING - GLUCOSE)-MANUAL ENTRY: Glucose Fasting, POC: 107 mg/dL — AB (ref 70–99)

## 2023-07-26 MED ORDER — SULFAMETHOXAZOLE-TRIMETHOPRIM 800-160 MG PO TABS
1.0000 | ORAL_TABLET | Freq: Two times a day (BID) | ORAL | 0 refills | Status: AC
Start: 2023-07-26 — End: 2023-08-02

## 2023-07-26 NOTE — Progress Notes (Signed)
 Established Patient Office Visit  Subjective:  Patient ID: Edward Thompson, male    DOB: [birth year]  Age: 73 y.o. MRN: [id number]  Chief Complaint  Patient presents with   Follow-up    3 month follow up  Labs done prior      No new complaints except urinary frequency and malodorous urine. Also here for lab review and medication refills. Labs reviewed and notable for well controlled diabetes, A1c at target. Rarely checks his blood glucose. No recent seizures.     No other concerns at this time.   Past Medical History:  Diagnosis Date   CAD (coronary artery disease)    Carotid stenosis, left 08/2017   Diabetes mellitus without complication (HCC)    Foot drop    Fracture of neck (HCC) 2008   fell off a roof. required halo x 4 months. also fractured alot of vertebrae   Heart attack (HCC) 2015   Heart disease    Hepatitis    6th grade    Hyperlipidemia    Hypertension    Myocardial infarction acute (HCC) 2015   Seizures (HCC)    taking phenobarbitol and dilantin. LAST SEIZURE WAS 2016. well controlled on meds   Sleep apnea    USES CPAP   Syncope 2019   Vertigo    Viral meningitis     Past Surgical History:  Procedure Laterality Date   CARDIAC SURGERY     COLONOSCOPY     COLONOSCOPY WITH PROPOFOL N/A 03/11/2021   Procedure: COLONOSCOPY WITH PROPOFOL;  Surgeon: Toledo, Boykin Nearing, MD;  Location: [facility];  Service: Gastroenterology;  Laterality: N/A;   CORONARY ANGIOPLASTY     PATIENT UNAWARE OF THIS   CORONARY ARTERY BYPASS GRAFT  2015   ENDARTERECTOMY Left 08/19/2017   Procedure: ENDARTERECTOMY CAROTID;  Surgeon: Renford Dills, MD;  Location: [facility];  Service: Vascular;  Laterality: Left;   ESOPHAGOGASTRODUODENOSCOPY N/A 03/11/2021   Procedure: ESOPHAGOGASTRODUODENOSCOPY (EGD);  Surgeon: Toledo, Boykin Nearing, MD;  Location: [facility];  Service: Gastroenterology;  Laterality: N/A;  DM   HERNIA REPAIR Right 1985   inguinal    Social History    Socioeconomic History   Marital status: Widowed    Spouse name: Not on file   Number of children: Not on file   Years of education: Not on file   Highest education level: Not on file  Occupational History   Not on file  Tobacco Use   Smoking status: Never   Smokeless tobacco: Never  Vaping Use   Vaping status: Never Used  Substance and Sexual Activity   Alcohol use: No   Drug use: No   Sexual activity: Not Currently  Other Topics Concern   Not on file  Social History Narrative   Not on file   Social Drivers of Health   Financial Resource Strain: Low Risk  (02/12/2023)   Received from [facility]   Overall Financial Resource Strain (CARDIA)    Difficulty of Paying Living Expenses: Not very hard  Food Insecurity: No Food Insecurity (02/12/2023)   Received from [facility]   Hunger Vital Sign    Worried About Running Out of Food in the Last Year: Never true    Ran Out of Food in the Last Year: Never true  Transportation Needs: No Transportation Needs (02/12/2023)   Received from [facility] - Transportation    Lack of Transportation (Medical): No    Lack of Transportation (Non-Medical):  No  Physical Activity: Not on file  Stress: Not on file  Social Connections: Not on file  Intimate Partner Violence: Patient Unable To Answer (02/09/2023)   Humiliation, Afraid, Rape, and Kick questionnaire    Fear of Current or Ex-Partner: Patient unable to answer    Emotionally Abused: Patient unable to answer    Physically Abused: Patient unable to answer    Sexually Abused: Patient unable to answer    Family History  Problem Relation Age of Onset   Heart disease Mother    Heart attack Father     No Known Allergies  Outpatient Medications Prior to Visit  Medication Sig   Accu-Chek Softclix Lancets lancets TEST BLOOD SUGAR THREE TIMES DAILY   acetaminophen (TYLENOL) 500 MG tablet Take 500 mg by mouth every 6 (six) hours as needed.   aspirin EC 81 MG  tablet Take 81 mg by mouth at bedtime.   Blood Glucose Monitoring Suppl (ACCU-CHEK GUIDE) w/Device KIT    clopidogrel (PLAVIX) 75 MG tablet Take 1 tablet by mouth once daily   escitalopram (LEXAPRO) 10 MG tablet TAKE 1 TABLET EVERY MORNING   feeding supplement, GLUCERNA SHAKE, (GLUCERNA SHAKE) LIQD Take 237 mLs by mouth 2 (two) times daily between meals.   furosemide (LASIX) 20 MG tablet Take 1 tablet (20 mg total) by mouth daily.   glucose blood (ACCU-CHEK GUIDE) test strip USE WITH DIABETIC METER TO TEST BLOOD SUGAR THREE TIMES DAILY   isosorbide mononitrate (IMDUR) 30 MG 24 hr tablet TAKE 1 TABLET EVERY DAY   latanoprost (XALATAN) 0.005 % ophthalmic solution Place 1 drop into both eyes at bedtime.   meclizine (ANTIVERT) 25 MG tablet Take 1 tablet (25 mg total) by mouth in the morning, at noon, in the evening, and at bedtime. TAKE 1 TABLET FOUR TIMES DAILY   metFORMIN (GLUCOPHAGE) 500 MG tablet TAKE 1 TABLET EVERY MORNING   metoprolol succinate (TOPROL-XL) 25 MG 24 hr tablet Take 0.5 tablets (12.5 mg total) by mouth daily.   Multiple Vitamin (MULTIVITAMIN) tablet Take 1 tablet by mouth daily.   nitroGLYCERIN (NITROSTAT) 0.4 MG SL tablet Place 0.4 mg under the tongue every 5 (five) minutes as needed for chest pain.   pantoprazole (PROTONIX) 40 MG tablet TAKE 1 TABLET EVERY MORNING   PHENobarbital (LUMINAL) 97.2 MG tablet Take 1 tablet (97.2 mg total) by mouth at bedtime.   phenytoin (DILANTIN) 200 MG ER capsule Take 1 capsule (200 mg total) by mouth daily. Take with 1 capsule 30 mg dose for total daily dose 230 mg   phenytoin (DILANTIN) 30 MG ER capsule Take 1 capsule (30 mg total) by mouth daily. Take with 1 capsule 200 mg dose for total daily dose 230 mg   Pollen Extracts (PROSTAT PO) Take 30 mLs by mouth daily.   polyethylene glycol (MIRALAX / GLYCOLAX) 17 g packet Take 17 g by mouth daily.   rosuvastatin (CRESTOR) 40 MG tablet TAKE 1 TABLET AT BEDTIME   senna-docusate (SENOKOT-Kennede Lusk) 8.6-50 MG  tablet Take 1 tablet by mouth at bedtime.   No facility-administered medications prior to visit.    Review of Systems  Constitutional: Negative.   HENT: Negative.    Eyes: Negative.   Respiratory: Negative.    Gastrointestinal: Negative.   Genitourinary:  Positive for urgency.  Musculoskeletal: Negative.   Skin: Negative.   Neurological: Negative.   Endo/Heme/Allergies: Negative.   Psychiatric/Behavioral: Negative.    All other systems reviewed and are negative.      Objective:  BP 110/73   Pulse 67   Ht 5\' 4"  (1.626 m)   Wt 195 lb (88.5 kg)   SpO2 98%   BMI 33.47 kg/m   Vitals:   07/26/23 1508  BP: 110/73  Pulse: 67  Height: 5\' 4"  (1.626 m)  Weight: 195 lb (88.5 kg)  SpO2: 98%  BMI (Calculated): 33.46    Physical Exam Vitals reviewed.  Constitutional:      Appearance: Normal appearance. He is normal weight.  HENT:     Head: Normocephalic.     Nose: Nose normal.     Mouth/Throat:     Mouth: Mucous membranes are moist.  Eyes:     Pupils: Pupils are equal, round, and reactive to light.  Cardiovascular:     Rate and Rhythm: Normal rate and regular rhythm.     Pulses: Normal pulses.     Heart sounds: Normal heart sounds.  Pulmonary:     Effort: Pulmonary effort is normal.  Abdominal:     General: Abdomen is flat. Bowel sounds are normal.  Musculoskeletal:        General: Normal range of motion.     Cervical back: Normal range of motion.  Skin:    General: Skin is warm.  Neurological:     General: No focal deficit present.     Mental Status: He is alert.  Psychiatric:        Mood and Affect: Mood normal.      Results for orders placed or performed in visit on 07/26/23  POCT CBG (Fasting - Glucose)  Result Value Ref Range   Glucose Fasting, POC 107 (A) 70 - 99 mg/dL    Recent Results (from the past 2160 hours)  Hemoglobin A1c     Status: Abnormal   Collection Time: 07/22/23  8:54 AM  Result Value Ref Range   Hgb A1c MFr Bld 5.7 (H) 4.8 -  5.6 %    Comment:          Prediabetes: 5.7 - 6.4          Diabetes: >6.4          Glycemic control for adults with diabetes: <7.0    Est. average glucose Bld gHb Est-mCnc 117 mg/dL  POCT CBG (Fasting - Glucose)     Status: Abnormal   Collection Time: 07/26/23  3:14 PM  Result Value Ref Range   Glucose Fasting, POC 107 (A) 70 - 99 mg/dL      Assessment & Plan:  As per problem list  Problem List Items Addressed This Visit       Nervous and Auditory   Seizure disorder (HCC)   Relevant Orders   Phenobarbital   Other Visit Diagnoses       Diabetes mellitus without complication (HCC)    -  Primary   Relevant Orders   POCT CBG (Fasting - Glucose) (Completed)   Lipid panel     Urinary frequency       Relevant Orders   POCT Urinalysis Dipstick (16109)     Primary hypertension       Relevant Orders   Comprehensive metabolic panel       Return in about 3 months (around 10/26/2023).   Total time spent: 20 minutes  Luna Fuse, MD  07/26/2023   This document may have been prepared by [facility] Voice Recognition software and as such may include unintentional dictation errors.

## 2023-07-27 DIAGNOSIS — N3000 Acute cystitis without hematuria: Secondary | ICD-10-CM | POA: Diagnosis not present

## 2023-07-28 ENCOUNTER — Ambulatory Visit: Payer: Medicare PPO | Admitting: Cardiovascular Disease

## 2023-07-28 ENCOUNTER — Encounter: Payer: Self-pay | Admitting: Cardiovascular Disease

## 2023-07-28 VITALS — BP 110/64 | HR 73 | Ht 64.0 in | Wt 195.0 lb

## 2023-07-28 DIAGNOSIS — R55 Syncope and collapse: Secondary | ICD-10-CM | POA: Diagnosis not present

## 2023-07-28 DIAGNOSIS — I6522 Occlusion and stenosis of left carotid artery: Secondary | ICD-10-CM | POA: Diagnosis not present

## 2023-07-28 DIAGNOSIS — I10 Essential (primary) hypertension: Secondary | ICD-10-CM

## 2023-07-28 DIAGNOSIS — E782 Mixed hyperlipidemia: Secondary | ICD-10-CM | POA: Diagnosis not present

## 2023-07-28 DIAGNOSIS — E119 Type 2 diabetes mellitus without complications: Secondary | ICD-10-CM

## 2023-07-28 NOTE — Progress Notes (Signed)
 Cardiology Office Note   Date:  07/28/2023   ID:  Edward Thompson, Edward Thompson [birth year], MRN [id number]  PCP:  Sherron Monday, MD  Cardiologist:  Adrian Blackwater, MD      History of Present Illness: Edward Thompson is a 73 y.o. male who presents for  Chief Complaint  Patient presents with   Follow-up    5 Nst Results    No chest pain or SOB      Past Medical History:  Diagnosis Date   CAD (coronary artery disease)    Carotid stenosis, left 08/2017   Diabetes mellitus without complication (HCC)    Foot drop    Fracture of neck (HCC) 2008   fell off a roof. required halo x 4 months. also fractured alot of vertebrae   Heart attack (HCC) 2015   Heart disease    Hepatitis    6th grade    Hyperlipidemia    Hypertension    Myocardial infarction acute (HCC) 2015   Seizures (HCC)    taking phenobarbitol and dilantin. LAST SEIZURE WAS 2016. well controlled on meds   Sleep apnea    USES CPAP   Syncope 2019   Vertigo    Viral meningitis      Past Surgical History:  Procedure Laterality Date   CARDIAC SURGERY     COLONOSCOPY     COLONOSCOPY WITH PROPOFOL N/A 03/11/2021   Procedure: COLONOSCOPY WITH PROPOFOL;  Surgeon: Toledo, Boykin Nearing, MD;  Location: [facility];  Service: Gastroenterology;  Laterality: N/A;   CORONARY ANGIOPLASTY     PATIENT UNAWARE OF THIS   CORONARY ARTERY BYPASS GRAFT  2015   ENDARTERECTOMY Left 08/19/2017   Procedure: ENDARTERECTOMY CAROTID;  Surgeon: Renford Dills, MD;  Location: [facility];  Service: Vascular;  Laterality: Left;   ESOPHAGOGASTRODUODENOSCOPY N/A 03/11/2021   Procedure: ESOPHAGOGASTRODUODENOSCOPY (EGD);  Surgeon: Toledo, Boykin Nearing, MD;  Location: [facility];  Service: Gastroenterology;  Laterality: N/A;  DM   HERNIA REPAIR Right 1985   inguinal     Current Outpatient Medications  Medication Sig Dispense Refill   Accu-Chek Softclix Lancets lancets TEST BLOOD SUGAR THREE TIMES DAILY 300 each 11   acetaminophen  (TYLENOL) 500 MG tablet Take 500 mg by mouth every 6 (six) hours as needed.     aspirin EC 81 MG tablet Take 81 mg by mouth at bedtime.     Blood Glucose Monitoring Suppl (ACCU-CHEK GUIDE) w/Device KIT      clopidogrel (PLAVIX) 75 MG tablet Take 1 tablet by mouth once daily 90 tablet 0   escitalopram (LEXAPRO) 10 MG tablet TAKE 1 TABLET EVERY MORNING 90 tablet 3   feeding supplement, GLUCERNA SHAKE, (GLUCERNA SHAKE) LIQD Take 237 mLs by mouth 2 (two) times daily between meals. 1000 mL 12   furosemide (LASIX) 20 MG tablet Take 1 tablet (20 mg total) by mouth daily. 30 tablet 11   glucose blood (ACCU-CHEK GUIDE) test strip USE WITH DIABETIC METER TO TEST BLOOD SUGAR THREE TIMES DAILY 300 strip 3   isosorbide mononitrate (IMDUR) 30 MG 24 hr tablet TAKE 1 TABLET EVERY DAY 90 tablet 3   latanoprost (XALATAN) 0.005 % ophthalmic solution Place 1 drop into both eyes at bedtime.     meclizine (ANTIVERT) 25 MG tablet Take 1 tablet (25 mg total) by mouth in the morning, at noon, in the evening, and at bedtime. TAKE 1 TABLET FOUR TIMES DAILY 360 tablet 3   metFORMIN (GLUCOPHAGE) 500 MG tablet TAKE  1 TABLET EVERY MORNING 90 tablet 3   metoprolol succinate (TOPROL-XL) 25 MG 24 hr tablet Take 0.5 tablets (12.5 mg total) by mouth daily.     Multiple Vitamin (MULTIVITAMIN) tablet Take 1 tablet by mouth daily.     nitroGLYCERIN (NITROSTAT) 0.4 MG SL tablet Place 0.4 mg under the tongue every 5 (five) minutes as needed for chest pain.     pantoprazole (PROTONIX) 40 MG tablet TAKE 1 TABLET EVERY MORNING 90 tablet 3   PHENobarbital (LUMINAL) 97.2 MG tablet Take 1 tablet (97.2 mg total) by mouth at bedtime. 90 tablet 1   phenytoin (DILANTIN) 200 MG ER capsule Take 1 capsule (200 mg total) by mouth daily. Take with 1 capsule 30 mg dose for total daily dose 230 mg 30 capsule 0   phenytoin (DILANTIN) 30 MG ER capsule Take 1 capsule (30 mg total) by mouth daily. Take with 1 capsule 200 mg dose for total daily dose 230 mg 90  capsule 1   Pollen Extracts (PROSTAT PO) Take 30 mLs by mouth daily.     polyethylene glycol (MIRALAX / GLYCOLAX) 17 g packet Take 17 g by mouth daily. 14 each 0   rosuvastatin (CRESTOR) 40 MG tablet TAKE 1 TABLET AT BEDTIME 90 tablet 3   senna-docusate (SENOKOT-S) 8.6-50 MG tablet Take 1 tablet by mouth at bedtime.     sulfamethoxazole-trimethoprim (BACTRIM DS) 800-160 MG tablet Take 1 tablet by mouth 2 (two) times daily for 7 days. 14 tablet 0   No current facility-administered medications for this visit.    Allergies:   Patient has no known allergies.    Social History:   reports that he has never smoked. He has never used smokeless tobacco. He reports that he does not drink alcohol and does not use drugs.   Family History:  family history includes Heart attack in his father; Heart disease in his mother.    ROS:     Review of Systems  Constitutional: Negative.   HENT: Negative.    Eyes: Negative.   Respiratory: Negative.    Gastrointestinal: Negative.   Genitourinary: Negative.   Musculoskeletal: Negative.   Skin: Negative.   Neurological: Negative.   Endo/Heme/Allergies: Negative.   Psychiatric/Behavioral: Negative.    All other systems reviewed and are negative.     All other systems are reviewed and negative.    PHYSICAL EXAM: VS:  BP 110/64   Pulse 73   Ht 5\' 4"  (1.626 m)   Wt 195 lb (88.5 kg)   SpO2 95%   BMI 33.47 kg/m  , BMI Body mass index is 33.47 kg/m. Last weight:  Wt Readings from Last 3 Encounters:  07/28/23 195 lb (88.5 kg)  07/26/23 195 lb (88.5 kg)  06/23/23 195 lb (88.5 kg)     Physical Exam Vitals reviewed.  Constitutional:      Appearance: Normal appearance. He is normal weight.  HENT:     Head: Normocephalic.     Nose: Nose normal.     Mouth/Throat:     Mouth: Mucous membranes are moist.  Eyes:     Pupils: Pupils are equal, round, and reactive to light.  Cardiovascular:     Rate and Rhythm: Normal rate and regular rhythm.      Pulses: Normal pulses.     Heart sounds: Normal heart sounds.  Pulmonary:     Effort: Pulmonary effort is normal.  Abdominal:     General: Abdomen is flat. Bowel sounds are normal.  Musculoskeletal:  General: Normal range of motion.     Cervical back: Normal range of motion.  Skin:    General: Skin is warm.  Neurological:     General: No focal deficit present.     Mental Status: He is alert.  Psychiatric:        Mood and Affect: Mood normal.       EKG:   Recent Labs: 02/08/2023: BUN 18; Potassium 3.7; Sodium 139 02/09/2023: ALT 14; Creatinine, Ser 0.61; Hemoglobin 12.3; Platelets 171    Lipid Panel    Component Value Date/Time   CHOL 136 04/14/2023 0915   CHOL 117 09/23/2013 0132   TRIG 54 04/14/2023 0915   TRIG 64 09/23/2013 0132   HDL 66 04/14/2023 0915   HDL 49 09/23/2013 0132   CHOLHDL 2.1 04/14/2023 0915   CHOLHDL 3.5 02/06/2016 0419   VLDL 28 02/06/2016 0419   VLDL 13 09/23/2013 0132   LDLCALC 58 04/14/2023 0915   LDLCALC 55 09/23/2013 0132      Other studies Reviewed: Additional studies/ records that were reviewed today include:  Review of the above records demonstrates:       No data to display            ASSESSMENT AND PLAN:    ICD-10-CM   1. Primary hypertension  I10     2. Diabetes mellitus without complication (HCC)  E11.9     3. Essential hypertension  I10     4. Stenosis of left carotid artery  I65.22     5. Mixed hyperlipidemia  E78.2     6. Syncope, unspecified syncope type  R55    Stresss test showed no evidence of ischaemia with  normal LVEF. Doing well, no chest pain.       Problem List Items Addressed This Visit       Cardiovascular and Mediastinum   Carotid stenosis   Essential hypertension     Other   Syncope   Hyperlipidemia   Other Visit Diagnoses       Primary hypertension    -  Primary     Diabetes mellitus without complication (HCC)              Disposition:   Return in about 3 months  (around 10/28/2023).    Total time spent: 30 minutes  Signed,  Adrian Blackwater, MD  07/28/2023 3:33 PM    [facility]

## 2023-07-31 LAB — URINE CULTURE

## 2023-09-26 ENCOUNTER — Other Ambulatory Visit: Payer: Self-pay

## 2023-09-28 ENCOUNTER — Telehealth: Payer: Self-pay | Admitting: Internal Medicine

## 2023-09-28 NOTE — Telephone Encounter (Signed)
 Patient's daughter, Constancia Delton, left VM that she has a question about his medication. Did not give any further info.

## 2023-10-03 ENCOUNTER — Other Ambulatory Visit: Payer: Self-pay

## 2023-10-03 DIAGNOSIS — G40909 Epilepsy, unspecified, not intractable, without status epilepticus: Secondary | ICD-10-CM

## 2023-10-04 ENCOUNTER — Other Ambulatory Visit: Payer: Self-pay

## 2023-10-06 ENCOUNTER — Other Ambulatory Visit: Payer: Self-pay

## 2023-10-06 DIAGNOSIS — G40909 Epilepsy, unspecified, not intractable, without status epilepticus: Secondary | ICD-10-CM

## 2023-10-07 MED ORDER — PHENYTOIN SODIUM EXTENDED 200 MG PO CAPS: 200.0000 mg | ORAL_CAPSULE | Freq: Every day | ORAL | 0 refills | Status: DC

## 2023-10-07 MED ORDER — PHENYTOIN SODIUM EXTENDED 30 MG PO CAPS: 30.0000 mg | ORAL_CAPSULE | Freq: Every day | ORAL | 1 refills | Status: DC

## 2023-10-14 DIAGNOSIS — R339 Retention of urine, unspecified: Secondary | ICD-10-CM | POA: Diagnosis not present

## 2023-10-26 ENCOUNTER — Other Ambulatory Visit

## 2023-10-26 DIAGNOSIS — I10 Essential (primary) hypertension: Secondary | ICD-10-CM | POA: Diagnosis not present

## 2023-10-26 DIAGNOSIS — E119 Type 2 diabetes mellitus without complications: Secondary | ICD-10-CM | POA: Diagnosis not present

## 2023-10-26 DIAGNOSIS — G40909 Epilepsy, unspecified, not intractable, without status epilepticus: Secondary | ICD-10-CM | POA: Diagnosis not present

## 2023-10-26 LAB — HEMOGLOBIN A1C
Est. average glucose Bld gHb Est-mCnc: 117 mg/dL
Hgb A1c MFr Bld: 5.7 % — ABNORMAL HIGH (ref 4.8–5.6)

## 2023-10-27 LAB — COMPREHENSIVE METABOLIC PANEL WITH GFR
ALT: 11 IU/L (ref 0–44)
AST: 11 IU/L (ref 0–40)
Albumin: 4.1 g/dL (ref 3.8–4.8)
Alkaline Phosphatase: 102 IU/L (ref 44–121)
BUN/Creatinine Ratio: 20 (ref 10–24)
BUN: 11 mg/dL (ref 8–27)
Bilirubin Total: <0.2 mg/dL (ref 0.0–1.2)
CO2: 22 mmol/L (ref 20–29)
Calcium: 8.9 mg/dL (ref 8.6–10.2)
Chloride: 98 mmol/L (ref 96–106)
Creatinine, Ser: 0.55 mg/dL — ABNORMAL LOW (ref 0.76–1.27)
Globulin, Total: 2.7 g/dL (ref 1.5–4.5)
Glucose: 114 mg/dL — ABNORMAL HIGH (ref 70–99)
Potassium: 4 mmol/L (ref 3.5–5.2)
Sodium: 133 mmol/L — ABNORMAL LOW (ref 134–144)
Total Protein: 6.8 g/dL (ref 6.0–8.5)
eGFR: 105 mL/min/{1.73_m2} (ref >59)

## 2023-10-27 LAB — LIPID PANEL
Chol/HDL Ratio: 2.1 ratio (ref 0.0–5.0)
Cholesterol, Total: 133 mg/dL (ref 100–199)
HDL: 62 mg/dL (ref >39)
LDL Chol Calc (NIH): 61 mg/dL (ref 0–99)
Triglycerides: 43 mg/dL (ref 0–149)
VLDL Cholesterol Cal: 10 mg/dL (ref 5–40)

## 2023-10-27 LAB — PHENOBARBITAL LEVEL: Phenobarbital, Serum: 24 ug/mL (ref 15–40)

## 2023-10-28 ENCOUNTER — Encounter: Payer: Self-pay | Admitting: Cardiovascular Disease

## 2023-10-28 ENCOUNTER — Ambulatory Visit: Admitting: Cardiovascular Disease

## 2023-10-28 VITALS — BP 105/62 | HR 83 | Ht 64.0 in | Wt 195.0 lb

## 2023-10-28 DIAGNOSIS — E782 Mixed hyperlipidemia: Secondary | ICD-10-CM | POA: Diagnosis not present

## 2023-10-28 DIAGNOSIS — S20469A Insect bite (nonvenomous) of unspecified back wall of thorax, initial encounter: Secondary | ICD-10-CM | POA: Diagnosis not present

## 2023-10-28 DIAGNOSIS — Z955 Presence of coronary angioplasty implant and graft: Secondary | ICD-10-CM

## 2023-10-28 DIAGNOSIS — I252 Old myocardial infarction: Secondary | ICD-10-CM | POA: Diagnosis not present

## 2023-10-28 DIAGNOSIS — I2510 Atherosclerotic heart disease of native coronary artery without angina pectoris: Secondary | ICD-10-CM

## 2023-10-28 DIAGNOSIS — R609 Edema, unspecified: Secondary | ICD-10-CM

## 2023-10-28 DIAGNOSIS — L089 Local infection of the skin and subcutaneous tissue, unspecified: Secondary | ICD-10-CM | POA: Diagnosis not present

## 2023-10-28 DIAGNOSIS — Z0130 Encounter for examination of blood pressure without abnormal findings: Secondary | ICD-10-CM

## 2023-10-28 DIAGNOSIS — W57XXXA Bitten or stung by nonvenomous insect and other nonvenomous arthropods, initial encounter: Secondary | ICD-10-CM | POA: Diagnosis not present

## 2023-10-28 MED ORDER — PANTOPRAZOLE SODIUM 40 MG PO TBEC: 40.0000 mg | DELAYED_RELEASE_TABLET | Freq: Every morning | ORAL | 3 refills | Status: AC

## 2023-10-28 NOTE — Progress Notes (Signed)
 Cardiology Office Note   Date:  10/28/2023   ID:  Zakkary, Thibault [birth year], MRN [id number]  PCP:  Shari Daughters, MD  Cardiologist:  Debborah Fairly, MD      History of Present Illness: Edward Thompson is a 73 y.o. male who presents for  Chief Complaint  Patient presents with   Follow-up    3 month follow up    Swelling of legs, but no SOB or Chest pains.      Past Medical History:  Diagnosis Date   CAD (coronary artery disease)    Carotid stenosis, left 08/2017   Diabetes mellitus without complication (HCC)    Foot drop    Fracture of neck (HCC) 2008   fell off a roof. required halo x 4 months. also fractured alot of vertebrae   Heart attack (HCC) 2015   Heart disease    Hepatitis    6th grade    Hyperlipidemia    Hypertension    Myocardial infarction acute (HCC) 2015   Seizures (HCC)    taking phenobarbitol and dilantin . LAST SEIZURE WAS 2016. well controlled on meds   Sleep apnea    USES CPAP   Syncope 2019   Vertigo    Viral meningitis      Past Surgical History:  Procedure Laterality Date   CARDIAC SURGERY     COLONOSCOPY     COLONOSCOPY WITH PROPOFOL  N/A 03/11/2021   Procedure: COLONOSCOPY WITH PROPOFOL ;  Surgeon: Toledo, Alphonsus Jeans, MD;  Location: [facility];  Service: Gastroenterology;  Laterality: N/A;   CORONARY ANGIOPLASTY     PATIENT UNAWARE OF THIS   CORONARY ARTERY BYPASS GRAFT  2015   ENDARTERECTOMY Left 08/19/2017   Procedure: ENDARTERECTOMY CAROTID;  Surgeon: Jackquelyn Mass, MD;  Location: [facility];  Service: Vascular;  Laterality: Left;   ESOPHAGOGASTRODUODENOSCOPY N/A 03/11/2021   Procedure: ESOPHAGOGASTRODUODENOSCOPY (EGD);  Surgeon: Toledo, Alphonsus Jeans, MD;  Location: [facility];  Service: Gastroenterology;  Laterality: N/A;  DM   HERNIA REPAIR Right 1985   inguinal     Current Outpatient Medications  Medication Sig Dispense Refill   acetaminophen  (TYLENOL ) 500 MG tablet Take 500 mg by mouth every 6 (six)  hours as needed.     aspirin  EC 81 MG tablet Take 81 mg by mouth at bedtime.     clopidogrel  (PLAVIX ) 75 MG tablet Take 1 tablet by mouth once daily 90 tablet 0   furosemide  (LASIX ) 20 MG tablet Take 1 tablet (20 mg total) by mouth daily. 30 tablet 11   glucose blood (ACCU-CHEK GUIDE) test strip USE WITH DIABETIC METER TO TEST BLOOD SUGAR THREE TIMES DAILY 300 strip 3   isosorbide  mononitrate (IMDUR ) 30 MG 24 hr tablet TAKE 1 TABLET EVERY DAY 90 tablet 3   meclizine  (ANTIVERT ) 25 MG tablet Take 1 tablet (25 mg total) by mouth in the morning, at noon, in the evening, and at bedtime. TAKE 1 TABLET FOUR TIMES DAILY 360 tablet 3   metFORMIN  (GLUCOPHAGE ) 500 MG tablet TAKE 1 TABLET EVERY MORNING 90 tablet 3   metoprolol  succinate (TOPROL -XL) 25 MG 24 hr tablet Take 0.5 tablets (12.5 mg total) by mouth daily.     phenytoin  (DILANTIN ) 200 MG ER capsule Take 1 capsule (200 mg total) by mouth daily. Take with 1 capsule 30 mg dose for total daily dose 230 mg 30 capsule 0   phenytoin  (DILANTIN ) 30 MG ER capsule Take 1 capsule (30 mg total) by mouth daily. Take  with 1 capsule 200 mg dose for total daily dose 230 mg 90 capsule 1   senna-docusate (SENOKOT-S) 8.6-50 MG tablet Take 1 tablet by mouth at bedtime.     Accu-Chek Softclix Lancets lancets TEST BLOOD SUGAR THREE TIMES DAILY 300 each 11   Blood Glucose Monitoring Suppl (ACCU-CHEK GUIDE) w/Device KIT      nitroGLYCERIN  (NITROSTAT ) 0.4 MG SL tablet Place 0.4 mg under the tongue every 5 (five) minutes as needed for chest pain.     pantoprazole  (PROTONIX ) 40 MG tablet Take 1 tablet (40 mg total) by mouth every morning. 90 tablet 3   PHENobarbital  (LUMINAL) 97.2 MG tablet Take 1 tablet (97.2 mg total) by mouth at bedtime. 90 tablet 1   phenytoin  (DILANTIN ) 100 MG ER capsule Take 100 mg by mouth 3 (three) times daily. (Patient not taking: Reported on 10/28/2023)     rosuvastatin  (CRESTOR ) 40 MG tablet TAKE 1 TABLET AT BEDTIME 90 tablet 3   No current  facility-administered medications for this visit.    Allergies:   Patient has no known allergies.    Social History:   reports that he has never smoked. He has never used smokeless tobacco. He reports that he does not drink alcohol and does not use drugs.   Family History:  family history includes Heart attack in his father; Heart disease in his mother.    ROS:     Review of Systems  Constitutional: Negative.   HENT: Negative.    Eyes: Negative.   Respiratory: Negative.    Gastrointestinal: Negative.   Genitourinary: Negative.   Musculoskeletal: Negative.   Skin: Negative.   Neurological: Negative.   Endo/Heme/Allergies: Negative.   Psychiatric/Behavioral: Negative.    All other systems reviewed and are negative.     All other systems are reviewed and negative.    PHYSICAL EXAM: VS:  BP 105/62   Pulse 83   Ht 5' 4 (1.626 m)   Wt 195 lb (88.5 kg)   SpO2 96%   BMI 33.47 kg/m  , BMI Body mass index is 33.47 kg/m. Last weight:  Wt Readings from Last 3 Encounters:  10/28/23 195 lb (88.5 kg)  07/28/23 195 lb (88.5 kg)  07/26/23 195 lb (88.5 kg)     Physical Exam Vitals reviewed.  Constitutional:      Appearance: Normal appearance. He is normal weight.  HENT:     Head: Normocephalic.     Nose: Nose normal.     Mouth/Throat:     Mouth: Mucous membranes are moist.   Eyes:     Pupils: Pupils are equal, round, and reactive to light.    Cardiovascular:     Rate and Rhythm: Normal rate and regular rhythm.     Pulses: Normal pulses.     Heart sounds: Normal heart sounds.  Pulmonary:     Effort: Pulmonary effort is normal.  Abdominal:     General: Abdomen is flat. Bowel sounds are normal.   Musculoskeletal:        General: Normal range of motion.     Cervical back: Normal range of motion.   Skin:    General: Skin is warm.   Neurological:     General: No focal deficit present.     Mental Status: He is alert.   Psychiatric:        Mood and Affect:  Mood normal.       EKG:   Recent Labs: 02/09/2023: Hemoglobin 12.3; Platelets 171 10/26/2023: ALT 11; BUN  11; Creatinine, Ser 0.55; Potassium 4.0; Sodium 133    Lipid Panel    Component Value Date/Time   CHOL 133 10/26/2023 0913   CHOL 117 09/23/2013 0132   TRIG 43 10/26/2023 0913   TRIG 64 09/23/2013 0132   HDL 62 10/26/2023 0913   HDL 49 09/23/2013 0132   CHOLHDL 2.1 10/26/2023 0913   CHOLHDL 3.5 02/06/2016 0419   VLDL 28 02/06/2016 0419   VLDL 13 09/23/2013 0132   LDLCALC 61 10/26/2023 0913   LDLCALC 55 09/23/2013 0132      Other studies Reviewed: Additional studies/ records that were reviewed today include:  Review of the above records demonstrates:       No data to display            ASSESSMENT AND PLAN:    ICD-10-CM   1. Coronary artery disease involving native coronary artery of native heart without angina pectoris  I25.10 pantoprazole  (PROTONIX ) 40 MG tablet    2. Old myocardial infarction  I25.2 pantoprazole  (PROTONIX ) 40 MG tablet    3. CAD s/p coronary artery stent placement  Z95.5 pantoprazole  (PROTONIX ) 40 MG tablet   No chest pain or SOB. 3/25 stress test normal    4. History of non-ST elevation myocardial infarction (NSTEMI)  I25.2 pantoprazole  (PROTONIX ) 40 MG tablet    5. Mixed hyperlipidemia  E78.2 pantoprazole  (PROTONIX ) 40 MG tablet    6. Edema, unspecified type  R60.9 pantoprazole  (PROTONIX ) 40 MG tablet   improved with stockings.       Problem List Items Addressed This Visit       Cardiovascular and Mediastinum   CAD (coronary artery disease) - Primary   Relevant Medications   pantoprazole  (PROTONIX ) 40 MG tablet   Old myocardial infarction   Relevant Medications   pantoprazole  (PROTONIX ) 40 MG tablet     Other   Hyperlipidemia   Relevant Medications   pantoprazole  (PROTONIX ) 40 MG tablet   History of non-ST elevation myocardial infarction (NSTEMI)   Relevant Medications   pantoprazole  (PROTONIX ) 40 MG tablet   CAD  s/p coronary artery stent placement   Relevant Medications   pantoprazole  (PROTONIX ) 40 MG tablet   Other Visit Diagnoses       Edema, unspecified type       improved with stockings.   Relevant Medications   pantoprazole  (PROTONIX ) 40 MG tablet          Disposition:   Return in about 4 months (around 02/27/2024).    Total time spent: 30 minutes  Signed,  Debborah Fairly, MD  10/28/2023 3:32 PM    [facility]

## 2023-10-31 ENCOUNTER — Encounter: Payer: Self-pay | Admitting: Internal Medicine

## 2023-10-31 ENCOUNTER — Ambulatory Visit: Payer: Self-pay | Admitting: Internal Medicine

## 2023-10-31 ENCOUNTER — Ambulatory Visit (INDEPENDENT_AMBULATORY_CARE_PROVIDER_SITE_OTHER): Admitting: Internal Medicine

## 2023-10-31 VITALS — BP 121/78 | HR 82 | Temp 98.1°F | Ht 64.0 in | Wt 195.0 lb

## 2023-10-31 DIAGNOSIS — E782 Mixed hyperlipidemia: Secondary | ICD-10-CM

## 2023-10-31 DIAGNOSIS — N401 Enlarged prostate with lower urinary tract symptoms: Secondary | ICD-10-CM | POA: Diagnosis not present

## 2023-10-31 DIAGNOSIS — G40909 Epilepsy, unspecified, not intractable, without status epilepticus: Secondary | ICD-10-CM

## 2023-10-31 DIAGNOSIS — E119 Type 2 diabetes mellitus without complications: Secondary | ICD-10-CM

## 2023-10-31 DIAGNOSIS — R350 Frequency of micturition: Secondary | ICD-10-CM | POA: Diagnosis not present

## 2023-10-31 DIAGNOSIS — I10 Essential (primary) hypertension: Secondary | ICD-10-CM

## 2023-10-31 LAB — POCT CBG (FASTING - GLUCOSE)-MANUAL ENTRY: Glucose Fasting, POC: 117 mg/dL — AB (ref 70–99)

## 2023-10-31 MED ORDER — METFORMIN HCL 500 MG PO TABS: 500.0000 mg | ORAL_TABLET | Freq: Every morning | ORAL | 1 refills | Status: AC

## 2023-10-31 MED ORDER — PHENYTOIN SODIUM EXTENDED 200 MG PO CAPS: 200.0000 mg | ORAL_CAPSULE | Freq: Every day | ORAL | 3 refills | Status: DC

## 2023-10-31 MED ORDER — PHENOBARBITAL 97.2 MG PO TABS: 97.2000 mg | ORAL_TABLET | Freq: Every day | ORAL | 3 refills | Status: AC

## 2023-10-31 NOTE — Progress Notes (Unsigned)
 Established Patient Office Visit  Subjective:  Patient ID: Edward Thompson, male    DOB: 07-19-1950  Age: 73 y.o. MRN: [id number]  Chief Complaint  Patient presents with  . Follow-up    3 month follow up    No new complaints, here for lab review and medication refills. Labs reviewed and notable for well controlled diabetes, A1c at target, lipids at target with unremarkable cmp. Denies any hypoglycemic episodes and home bg readings have been at target.     No other concerns at this time.   Past Medical History:  Diagnosis Date  . CAD (coronary artery disease)   . Carotid stenosis, left 08/2017  . Diabetes mellitus without complication (HCC)   . Foot drop   . Fracture of neck (HCC) 2008   fell off a roof. required halo x 4 months. also fractured alot of vertebrae  . Heart attack (HCC) 2015  . Heart disease   . Hepatitis    6th grade   . Hyperlipidemia   . Hypertension   . Myocardial infarction acute (HCC) 2015  . Seizures (HCC)    taking phenobarbitol and dilantin . LAST SEIZURE WAS 2016. well controlled on meds  . Sleep apnea    USES CPAP  . Syncope 2019  . Vertigo   . Viral meningitis     Past Surgical History:  Procedure Laterality Date  . CARDIAC SURGERY    . COLONOSCOPY    . COLONOSCOPY WITH PROPOFOL  N/A 03/11/2021   Procedure: COLONOSCOPY WITH PROPOFOL ;  Surgeon: Toledo, Ladell POUR, MD;  Location: [facility];  Service: Gastroenterology;  Laterality: N/A;  . CORONARY ANGIOPLASTY     PATIENT UNAWARE OF THIS  . CORONARY ARTERY BYPASS GRAFT  2015  . ENDARTERECTOMY Left 08/19/2017   Procedure: ENDARTERECTOMY CAROTID;  Surgeon: Jama Cordella MATSU, MD;  Location: [facility] ORS;  Service: Vascular;  Laterality: Left;  . ESOPHAGOGASTRODUODENOSCOPY N/A 03/11/2021   Procedure: ESOPHAGOGASTRODUODENOSCOPY (EGD);  Surgeon: Toledo, Ladell POUR, MD;  Location: [facility] ENDOSCOPY;  Service: Gastroenterology;  Laterality: N/A;  DM  . HERNIA REPAIR Right 1985   inguinal    Social  History   Socioeconomic History  . Marital status: Widowed    Spouse name: Not on file  . Number of children: Not on file  . Years of education: Not on file  . Highest education level: Not on file  Occupational History  . Not on file  Tobacco Use  . Smoking status: Never  . Smokeless tobacco: Never  Vaping Use  . Vaping status: Never Used  Substance and Sexual Activity  . Alcohol use: No  . Drug use: No  . Sexual activity: Not Currently  Other Topics Concern  . Not on file  Social History Narrative  . Not on file   Social Drivers of Health   Financial Resource Strain: Low Risk  (02/12/2023)   Received from [facility]   Overall Financial Resource Strain (CARDIA)   . Difficulty of Paying Living Expenses: Not very hard  Food Insecurity: No Food Insecurity (02/12/2023)   [facility]   Hunger Vital Sign   . Within the past 12 months, you worried that your food would run out before you got the money to buy more.: Never true   . Within the past 12 months, the food you bought just didn't last and you didn't have money to get more.: Never true  Transportation Needs: No Transportation Needs (02/12/2023)   [facility] from [facility]  PRAPARE - Transportation   . Lack of Transportation (Medical): No   . Lack of Transportation (Non-Medical): No  Physical Activity: Not on file  Stress: Not on file  Social Connections: Not on file  Intimate Partner Violence: Patient Unable To Answer (02/09/2023)   Humiliation, Afraid, Rape, and Kick questionnaire   . Fear of Current or Ex-Partner: Patient unable to answer   . Emotionally Abused: Patient unable to answer   . Physically Abused: Patient unable to answer   . Sexually Abused: Patient unable to answer    Family History  Problem Relation Age of Onset  . Heart disease Mother   . Heart attack Father     No Known Allergies  Outpatient Medications Prior to Visit  Medication Sig  . Accu-Chek Softclix Lancets  lancets TEST BLOOD SUGAR THREE TIMES DAILY  . acetaminophen  (TYLENOL ) 500 MG tablet Take 500 mg by mouth every 6 (six) hours as needed.  . aspirin  EC 81 MG tablet Take 81 mg by mouth at bedtime.  . Blood Glucose Monitoring Suppl (ACCU-CHEK GUIDE) w/Device KIT   . clopidogrel  (PLAVIX ) 75 MG tablet Take 1 tablet by mouth once daily  . doxycycline (ADOXA) 100 MG tablet Take 100 mg by mouth 2 (two) times daily.  . furosemide  (LASIX ) 20 MG tablet Take 1 tablet (20 mg total) by mouth daily.  SABRA glucose blood (ACCU-CHEK GUIDE) test strip USE WITH DIABETIC METER TO TEST BLOOD SUGAR THREE TIMES DAILY  . isosorbide  mononitrate (IMDUR ) 30 MG 24 hr tablet TAKE 1 TABLET EVERY DAY  . meclizine  (ANTIVERT ) 25 MG tablet Take 1 tablet (25 mg total) by mouth in the morning, at noon, in the evening, and at bedtime. TAKE 1 TABLET FOUR TIMES DAILY  . metoprolol  succinate (TOPROL -XL) 25 MG 24 hr tablet Take 0.5 tablets (12.5 mg total) by mouth daily.  . nitroGLYCERIN  (NITROSTAT ) 0.4 MG SL tablet Place 0.4 mg under the tongue every 5 (five) minutes as needed for chest pain.  . pantoprazole  (PROTONIX ) 40 MG tablet Take 1 tablet (40 mg total) by mouth every morning.  . phenytoin  (DILANTIN ) 100 MG ER capsule Take 100 mg by mouth 3 (three) times daily.  . phenytoin  (DILANTIN ) 30 MG ER capsule Take 1 capsule (30 mg total) by mouth daily. Take with 1 capsule 200 mg dose for total daily dose 230 mg  . rosuvastatin  (CRESTOR ) 40 MG tablet TAKE 1 TABLET AT BEDTIME  . senna-docusate (SENOKOT-Genavive Kubicki) 8.6-50 MG tablet Take 1 tablet by mouth at bedtime.  . [DISCONTINUED] metFORMIN  (GLUCOPHAGE ) 500 MG tablet TAKE 1 TABLET EVERY MORNING  . [DISCONTINUED] PHENobarbital  (LUMINAL) 97.2 MG tablet Take 1 tablet (97.2 mg total) by mouth at bedtime.  . [DISCONTINUED] phenytoin  (DILANTIN ) 200 MG ER capsule Take 1 capsule (200 mg total) by mouth daily. Take with 1 capsule 30 mg dose for total daily dose 230 mg   No facility-administered medications  prior to visit.    Review of Systems  Constitutional: Negative.   HENT: Negative.    Eyes: Negative.   Respiratory: Negative.    Gastrointestinal: Negative.   Genitourinary:  Positive for urgency.  Musculoskeletal: Negative.   Skin: Negative.   Neurological: Negative.   Endo/Heme/Allergies: Negative.   Psychiatric/Behavioral: Negative.    All other systems reviewed and are negative.      Objective:   BP 121/78   Pulse 82   Temp 98.1 F (36.7 C)   Ht 5' 4 (1.626 m)   Wt 195 lb (88.5 kg)  SpO2 96%   BMI 33.47 kg/m   Vitals:   10/31/23 1332  BP: 121/78  Pulse: 82  Temp: 98.1 F (36.7 C)  Height: 5' 4 (1.626 m)  Weight: 195 lb (88.5 kg)  SpO2: 96%  BMI (Calculated): 33.46    Physical Exam Vitals reviewed.  Constitutional:      Appearance: Normal appearance. He is normal weight.  HENT:     Head: Normocephalic.     Nose: Nose normal.     Mouth/Throat:     Mouth: Mucous membranes are moist.   Eyes:     Pupils: Pupils are equal, round, and reactive to light.    Cardiovascular:     Rate and Rhythm: Normal rate and regular rhythm.     Pulses: Normal pulses.     Heart sounds: Normal heart sounds.  Pulmonary:     Effort: Pulmonary effort is normal.  Abdominal:     General: Abdomen is flat. Bowel sounds are normal.   Musculoskeletal:        General: Normal range of motion.     Cervical back: Normal range of motion.   Skin:    General: Skin is warm.   Neurological:     General: No focal deficit present.     Mental Status: He is alert.   Psychiatric:        Mood and Affect: Mood normal.     Results for orders placed or performed in visit on 10/31/23  POCT CBG (Fasting - Glucose)  Result Value Ref Range   Glucose Fasting, POC 117 (A) 70 - 99 mg/dL    Recent Results (from the past 2160 hours)  Phenobarbital      Status: None   Collection Time: 10/26/23  9:13 AM  Result Value Ref Range   Phenobarbital , Serum 24 15 - 40 ug/mL    Comment:                                  Detection Limit = 3  Lipid panel     Status: None   Collection Time: 10/26/23  9:13 AM  Result Value Ref Range   Cholesterol, Total 133 100 - 199 mg/dL   Triglycerides 43 0 - 149 mg/dL   HDL 62 >60 mg/dL   VLDL Cholesterol Cal 10 5 - 40 mg/dL   LDL Chol Calc (NIH) 61 0 - 99 mg/dL   Chol/HDL Ratio 2.1 0.0 - 5.0 ratio    Comment:                                   T. Chol/HDL Ratio                                             Men  Women                               1/2 Avg.Risk  3.4    3.3                                   Avg.Risk  5.0    4.4  2X Avg.Risk  9.6    7.1                                3X Avg.Risk 23.4   11.0   Comprehensive metabolic panel     Status: Abnormal   Collection Time: 10/26/23  9:13 AM  Result Value Ref Range   Glucose 114 (H) 70 - 99 mg/dL   BUN 11 8 - 27 mg/dL   Creatinine, Ser 9.44 (L) 0.76 - 1.27 mg/dL   eGFR 894 >40 fO/fpw/8.26   BUN/Creatinine Ratio 20 10 - 24   Sodium 133 (L) 134 - 144 mmol/L   Potassium 4.0 3.5 - 5.2 mmol/L   Chloride 98 96 - 106 mmol/L   CO2 22 20 - 29 mmol/L   Calcium  8.9 8.6 - 10.2 mg/dL   Total Protein 6.8 6.0 - 8.5 g/dL   Albumin 4.1 3.8 - 4.8 g/dL   Globulin, Total 2.7 1.5 - 4.5 g/dL   Bilirubin Total <9.7 0.0 - 1.2 mg/dL   Alkaline Phosphatase 102 44 - 121 IU/L   AST 11 0 - 40 IU/L   ALT 11 0 - 44 IU/L  Hemoglobin A1c     Status: Abnormal   Collection Time: 10/26/23  9:17 AM  Result Value Ref Range   Hgb A1c MFr Bld 5.7 (H) 4.8 - 5.6 %    Comment:          Prediabetes: 5.7 - 6.4          Diabetes: >6.4          Glycemic control for adults with diabetes: <7.0    Est. average glucose Bld gHb Est-mCnc 117 mg/dL  POCT CBG (Fasting - Glucose)     Status: Abnormal   Collection Time: 10/31/23  1:36 PM  Result Value Ref Range   Glucose Fasting, POC 117 (A) 70 - 99 mg/dL      Assessment & Plan:  As per problem list.  Problem List Items Addressed This Visit        Nervous and Auditory   Seizure disorder (HCC)   Relevant Medications   PHENobarbital  (LUMINAL) 97.2 MG tablet   phenytoin  (DILANTIN ) 200 MG ER capsule     Other   Hyperlipidemia   Other Visit Diagnoses       Diabetes mellitus without complication (HCC)    -  Primary   Relevant Medications   metFORMIN  (GLUCOPHAGE ) 500 MG tablet   Other Relevant Orders   POCT CBG (Fasting - Glucose) (Completed)   Hemoglobin A1c   CBC With Diff/Platelet     Benign prostatic hyperplasia with urinary frequency       Relevant Orders   Lipid panel   PSA     Primary hypertension       Relevant Orders   Comprehensive metabolic panel with GFR       Return in about 3 months (around 01/31/2024) for awv with labs prior.   Total time spent: 20 minutes  Sherrill Cinderella Perry, MD  10/31/2023   This document may have been prepared by [facility] Voice Recognition software and as such may include unintentional dictation errors.

## 2023-11-03 ENCOUNTER — Other Ambulatory Visit: Payer: Self-pay

## 2023-11-03 DIAGNOSIS — E7800 Pure hypercholesterolemia, unspecified: Secondary | ICD-10-CM

## 2023-11-03 MED ORDER — ROSUVASTATIN CALCIUM 40 MG PO TABS: 40.0000 mg | ORAL_TABLET | Freq: Every day | ORAL | 3 refills | Status: DC

## 2023-11-30 DIAGNOSIS — D8989 Other specified disorders involving the immune mechanism, not elsewhere classified: Secondary | ICD-10-CM | POA: Diagnosis not present

## 2023-12-12 DIAGNOSIS — G629 Polyneuropathy, unspecified: Secondary | ICD-10-CM | POA: Diagnosis not present

## 2023-12-14 DIAGNOSIS — D8989 Other specified disorders involving the immune mechanism, not elsewhere classified: Secondary | ICD-10-CM | POA: Diagnosis not present

## 2023-12-17 ENCOUNTER — Other Ambulatory Visit: Payer: Self-pay | Admitting: Cardiovascular Disease

## 2023-12-17 ENCOUNTER — Other Ambulatory Visit: Payer: Self-pay | Admitting: Internal Medicine

## 2023-12-28 ENCOUNTER — Other Ambulatory Visit: Payer: Self-pay | Admitting: Cardiovascular Disease

## 2023-12-28 DIAGNOSIS — G40909 Epilepsy, unspecified, not intractable, without status epilepticus: Secondary | ICD-10-CM

## 2023-12-28 DIAGNOSIS — I2510 Atherosclerotic heart disease of native coronary artery without angina pectoris: Secondary | ICD-10-CM

## 2023-12-28 DIAGNOSIS — Z955 Presence of coronary angioplasty implant and graft: Secondary | ICD-10-CM

## 2023-12-28 DIAGNOSIS — Z9889 Other specified postprocedural states: Secondary | ICD-10-CM

## 2023-12-28 DIAGNOSIS — G4733 Obstructive sleep apnea (adult) (pediatric): Secondary | ICD-10-CM

## 2023-12-28 DIAGNOSIS — R55 Syncope and collapse: Secondary | ICD-10-CM

## 2023-12-28 DIAGNOSIS — E782 Mixed hyperlipidemia: Secondary | ICD-10-CM

## 2024-01-01 ENCOUNTER — Other Ambulatory Visit: Payer: Self-pay

## 2024-01-01 ENCOUNTER — Emergency Department

## 2024-01-01 ENCOUNTER — Inpatient Hospital Stay
Admission: EM | Admit: 2024-01-01 | Discharge: 2024-01-04 | DRG: 064 | Disposition: A | Discharge status: Home / Self Care | Attending: Internal Medicine | Admitting: Internal Medicine

## 2024-01-01 ENCOUNTER — Inpatient Hospital Stay

## 2024-01-01 DIAGNOSIS — I252 Old myocardial infarction: Secondary | ICD-10-CM

## 2024-01-01 DIAGNOSIS — Z7982 Long term (current) use of aspirin: Secondary | ICD-10-CM

## 2024-01-01 DIAGNOSIS — R471 Dysarthria and anarthria: Secondary | ICD-10-CM | POA: Diagnosis present

## 2024-01-01 DIAGNOSIS — I2510 Atherosclerotic heart disease of native coronary artery without angina pectoris: Secondary | ICD-10-CM | POA: Diagnosis present

## 2024-01-01 DIAGNOSIS — R531 Weakness: Secondary | ICD-10-CM | POA: Diagnosis not present

## 2024-01-01 DIAGNOSIS — G8191 Hemiplegia, unspecified affecting right dominant side: Secondary | ICD-10-CM | POA: Diagnosis present

## 2024-01-01 DIAGNOSIS — Z951 Presence of aortocoronary bypass graft: Secondary | ICD-10-CM | POA: Diagnosis not present

## 2024-01-01 DIAGNOSIS — N3090 Cystitis, unspecified without hematuria: Secondary | ICD-10-CM | POA: Diagnosis not present

## 2024-01-01 DIAGNOSIS — E1151 Type 2 diabetes mellitus with diabetic peripheral angiopathy without gangrene: Secondary | ICD-10-CM | POA: Diagnosis not present

## 2024-01-01 DIAGNOSIS — I6389 Other cerebral infarction: Secondary | ICD-10-CM | POA: Diagnosis not present

## 2024-01-01 DIAGNOSIS — R29810 Facial weakness: Principal | ICD-10-CM | POA: Diagnosis present

## 2024-01-01 DIAGNOSIS — Z993 Dependence on wheelchair: Secondary | ICD-10-CM

## 2024-01-01 DIAGNOSIS — E119 Type 2 diabetes mellitus without complications: Secondary | ICD-10-CM | POA: Diagnosis not present

## 2024-01-01 DIAGNOSIS — R1310 Dysphagia, unspecified: Secondary | ICD-10-CM | POA: Diagnosis present

## 2024-01-01 DIAGNOSIS — I10 Essential (primary) hypertension: Secondary | ICD-10-CM | POA: Diagnosis not present

## 2024-01-01 DIAGNOSIS — I639 Cerebral infarction, unspecified: Secondary | ICD-10-CM | POA: Diagnosis present

## 2024-01-01 DIAGNOSIS — N319 Neuromuscular dysfunction of bladder, unspecified: Secondary | ICD-10-CM | POA: Diagnosis present

## 2024-01-01 DIAGNOSIS — M21371 Foot drop, right foot: Secondary | ICD-10-CM | POA: Diagnosis present

## 2024-01-01 DIAGNOSIS — I651 Occlusion and stenosis of basilar artery: Secondary | ICD-10-CM | POA: Diagnosis not present

## 2024-01-01 DIAGNOSIS — Z955 Presence of coronary angioplasty implant and graft: Secondary | ICD-10-CM | POA: Diagnosis not present

## 2024-01-01 DIAGNOSIS — Z7902 Long term (current) use of antithrombotics/antiplatelets: Secondary | ICD-10-CM

## 2024-01-01 DIAGNOSIS — Z8249 Family history of ischemic heart disease and other diseases of the circulatory system: Secondary | ICD-10-CM | POA: Diagnosis not present

## 2024-01-01 DIAGNOSIS — R4701 Aphasia: Secondary | ICD-10-CM | POA: Diagnosis present

## 2024-01-01 DIAGNOSIS — G709 Myoneural disorder, unspecified: Secondary | ICD-10-CM | POA: Diagnosis not present

## 2024-01-01 DIAGNOSIS — Z7984 Long term (current) use of oral hypoglycemic drugs: Secondary | ICD-10-CM

## 2024-01-01 DIAGNOSIS — E785 Hyperlipidemia, unspecified: Secondary | ICD-10-CM | POA: Diagnosis present

## 2024-01-01 DIAGNOSIS — G40909 Epilepsy, unspecified, not intractable, without status epilepticus: Secondary | ICD-10-CM | POA: Diagnosis present

## 2024-01-01 DIAGNOSIS — Z79899 Other long term (current) drug therapy: Secondary | ICD-10-CM

## 2024-01-01 DIAGNOSIS — M21372 Foot drop, left foot: Secondary | ICD-10-CM | POA: Diagnosis present

## 2024-01-01 DIAGNOSIS — G9341 Metabolic encephalopathy: Secondary | ICD-10-CM | POA: Diagnosis not present

## 2024-01-01 DIAGNOSIS — I6381 Other cerebral infarction due to occlusion or stenosis of small artery: Secondary | ICD-10-CM | POA: Diagnosis not present

## 2024-01-01 DIAGNOSIS — I6503 Occlusion and stenosis of bilateral vertebral arteries: Secondary | ICD-10-CM | POA: Diagnosis not present

## 2024-01-01 DIAGNOSIS — I7090 Unspecified atherosclerosis: Secondary | ICD-10-CM | POA: Diagnosis not present

## 2024-01-01 DIAGNOSIS — B961 Klebsiella pneumoniae [K. pneumoniae] as the cause of diseases classified elsewhere: Secondary | ICD-10-CM | POA: Diagnosis present

## 2024-01-01 DIAGNOSIS — I6523 Occlusion and stenosis of bilateral carotid arteries: Secondary | ICD-10-CM | POA: Diagnosis not present

## 2024-01-01 DIAGNOSIS — R4781 Slurred speech: Secondary | ICD-10-CM | POA: Diagnosis not present

## 2024-01-01 DIAGNOSIS — R29818 Other symptoms and signs involving the nervous system: Secondary | ICD-10-CM | POA: Diagnosis not present

## 2024-01-01 DIAGNOSIS — I6782 Cerebral ischemia: Secondary | ICD-10-CM | POA: Diagnosis not present

## 2024-01-01 LAB — CBC
HCT: 37.2 % — ABNORMAL LOW (ref 39.0–52.0)
Hemoglobin: 12.2 g/dL — ABNORMAL LOW (ref 13.0–17.0)
MCH: 29.1 pg (ref 26.0–34.0)
MCHC: 32.8 g/dL (ref 30.0–36.0)
MCV: 88.8 fL (ref 80.0–100.0)
Platelets: 196 K/uL (ref 150–400)
RBC: 4.19 MIL/uL — ABNORMAL LOW (ref 4.22–5.81)
RDW: 14.6 % (ref 11.5–15.5)
WBC: 8.1 K/uL (ref 4.0–10.5)
nRBC: 0 % (ref 0.0–0.2)

## 2024-01-01 LAB — DIFFERENTIAL
Abs Immature Granulocytes: 0.04 K/uL (ref 0.00–0.07)
Basophils Absolute: 0 K/uL (ref 0.0–0.1)
Basophils Relative: 0 %
Eosinophils Absolute: 0.1 K/uL (ref 0.0–0.5)
Eosinophils Relative: 1 %
Immature Granulocytes: 1 %
Lymphocytes Relative: 18 %
Lymphs Abs: 1.5 K/uL (ref 0.7–4.0)
Monocytes Absolute: 0.8 K/uL (ref 0.1–1.0)
Monocytes Relative: 10 %
Neutro Abs: 5.7 K/uL (ref 1.7–7.7)
Neutrophils Relative %: 70 %

## 2024-01-01 LAB — URINALYSIS, ROUTINE W REFLEX MICROSCOPIC
Bilirubin Urine: NEGATIVE
Glucose, UA: NEGATIVE mg/dL
Ketones, ur: 5 mg/dL — AB
Nitrite: NEGATIVE
Protein, ur: NEGATIVE mg/dL
Specific Gravity, Urine: 1.017 (ref 1.005–1.030)
WBC, UA: >50 WBC/hpf (ref 0–5)
pH: 6 (ref 5.0–8.0)

## 2024-01-01 LAB — COMPREHENSIVE METABOLIC PANEL WITH GFR
ALT: 12 U/L (ref 0–44)
AST: 16 U/L (ref 15–41)
Albumin: 4.1 g/dL (ref 3.5–5.0)
Alkaline Phosphatase: 83 U/L (ref 38–126)
Anion gap: 9 (ref 5–15)
BUN: 15 mg/dL (ref 8–23)
CO2: 23 mmol/L (ref 22–32)
Calcium: 9.1 mg/dL (ref 8.9–10.3)
Chloride: 103 mmol/L (ref 98–111)
Creatinine, Ser: 0.67 mg/dL (ref 0.61–1.24)
GFR, Estimated: >60 mL/min (ref >60)
Glucose, Bld: 136 mg/dL — ABNORMAL HIGH (ref 70–99)
Potassium: 3.6 mmol/L (ref 3.5–5.1)
Sodium: 135 mmol/L (ref 135–145)
Total Bilirubin: 0.6 mg/dL (ref 0.0–1.2)
Total Protein: 7.7 g/dL (ref 6.5–8.1)

## 2024-01-01 LAB — PROTIME-INR
INR: 1.2 (ref 0.8–1.2)
Prothrombin Time: 16.2 s — ABNORMAL HIGH (ref 11.4–15.2)

## 2024-01-01 LAB — AMMONIA: Ammonia: <13 umol/L (ref 9–35)

## 2024-01-01 LAB — ETHANOL: Alcohol, Ethyl (B): <15 mg/dL (ref <15)

## 2024-01-01 LAB — TSH: TSH: 2.232 u[IU]/mL (ref 0.350–4.500)

## 2024-01-01 LAB — PHENYTOIN LEVEL, TOTAL: Phenytoin Lvl: 14.3 ug/mL (ref 10.0–20.0)

## 2024-01-01 LAB — CBG MONITORING, ED: Glucose-Capillary: 138 mg/dL — ABNORMAL HIGH (ref 70–99)

## 2024-01-01 LAB — APTT: aPTT: 34 s (ref 24–36)

## 2024-01-01 MED ORDER — PANTOPRAZOLE SODIUM 40 MG IV SOLR
40.0000 mg | INTRAVENOUS | Status: DC
  Administered 2024-01-01 – 2024-01-03 (×3): 40 mg via INTRAVENOUS
  Filled 2024-01-01 (×3): qty 10

## 2024-01-01 MED ORDER — LABETALOL HCL 5 MG/ML IV SOLN: 10.0000 mg | INTRAVENOUS | Status: DC | PRN

## 2024-01-01 MED ORDER — CLOPIDOGREL BISULFATE 75 MG PO TABS
75.0000 mg | ORAL_TABLET | Freq: Every day | ORAL | Status: DC
  Administered 2024-01-01 – 2024-01-04 (×3): 75 mg via ORAL
  Filled 2024-01-01 (×3): qty 1

## 2024-01-01 MED ORDER — PHENYTOIN SODIUM EXTENDED 30 MG PO CAPS: 30.0000 mg | ORAL_CAPSULE | Freq: Every day | ORAL | Status: DC

## 2024-01-01 MED ORDER — ACETAMINOPHEN 650 MG RE SUPP: 650.0000 mg | RECTAL | Status: DC | PRN

## 2024-01-01 MED ORDER — ROSUVASTATIN CALCIUM 20 MG PO TABS
40.0000 mg | ORAL_TABLET | Freq: Every day | ORAL | Status: DC
  Administered 2024-01-02 – 2024-01-03 (×2): 40 mg via ORAL
  Filled 2024-01-01: qty 4
  Filled 2024-01-01: qty 2

## 2024-01-01 MED ORDER — ACETAMINOPHEN 160 MG/5ML PO SOLN
650.0000 mg | ORAL | Status: DC | PRN
Start: 2024-01-01 — End: 2024-01-04

## 2024-01-01 MED ORDER — PHENOBARBITAL 32.4 MG PO TABS: 97.2000 mg | ORAL_TABLET | Freq: Every day | ORAL | Status: DC

## 2024-01-01 MED ORDER — ACETAMINOPHEN 325 MG PO TABS
650.0000 mg | ORAL_TABLET | ORAL | Status: DC | PRN
Start: 2024-01-01 — End: 2024-01-04

## 2024-01-01 MED ORDER — PHENYTOIN SODIUM EXTENDED 30 MG PO CAPS
230.0000 mg | ORAL_CAPSULE | Freq: Every day | ORAL | Status: DC
  Administered 2024-01-03 – 2024-01-04 (×2): 230 mg via ORAL
  Filled 2024-01-01 (×3): qty 1

## 2024-01-01 MED ORDER — SENNOSIDES-DOCUSATE SODIUM 8.6-50 MG PO TABS: 1.0000 | ORAL_TABLET | Freq: Every evening | ORAL | Status: DC | PRN

## 2024-01-01 MED ORDER — ROSUVASTATIN CALCIUM 20 MG PO TABS: 40.0000 mg | ORAL_TABLET | Freq: Every day | ORAL | Status: DC

## 2024-01-01 MED ORDER — ASPIRIN 81 MG PO TBEC
81.0000 mg | DELAYED_RELEASE_TABLET | Freq: Every day | ORAL | Status: DC
  Administered 2024-01-01 – 2024-01-04 (×3): 81 mg via ORAL
  Filled 2024-01-01 (×3): qty 1

## 2024-01-01 MED ORDER — FUROSEMIDE 40 MG PO TABS: 20.0000 mg | ORAL_TABLET | Freq: Every day | ORAL | Status: DC

## 2024-01-01 MED ORDER — SENNOSIDES-DOCUSATE SODIUM 8.6-50 MG PO TABS
1.0000 | ORAL_TABLET | Freq: Every day | ORAL | Status: DC
  Administered 2024-01-02 – 2024-01-03 (×2): 1 via ORAL
  Filled 2024-01-01 (×2): qty 1

## 2024-01-01 MED ORDER — SODIUM CHLORIDE 0.9 % IV SOLN
1.0000 g | INTRAVENOUS | Status: DC
  Administered 2024-01-02: 1 g via INTRAVENOUS
  Filled 2024-01-01: qty 10

## 2024-01-01 MED ORDER — IOHEXOL 350 MG/ML SOLN
100.0000 mL | Freq: Once | INTRAVENOUS | Status: AC | PRN
  Administered 2024-01-01: 100 mL via INTRAVENOUS

## 2024-01-01 MED ORDER — SODIUM CHLORIDE 0.9 % IV SOLN: INTRAVENOUS | Status: DC

## 2024-01-01 MED ORDER — INSULIN ASPART 100 UNIT/ML IJ SOLN
0.0000 [IU] | Freq: Three times a day (TID) | INTRAMUSCULAR | Status: DC
  Administered 2024-01-02: 3 [IU] via SUBCUTANEOUS
  Administered 2024-01-03 – 2024-01-04 (×3): 2 [IU] via SUBCUTANEOUS
  Filled 2024-01-01 (×3): qty 1

## 2024-01-01 MED ORDER — SODIUM CHLORIDE 0.9 % IV SOLN
1.0000 g | Freq: Once | INTRAVENOUS | Status: AC
  Administered 2024-01-01: 1 g via INTRAVENOUS
  Filled 2024-01-01: qty 10

## 2024-01-01 MED ORDER — ENOXAPARIN SODIUM 40 MG/0.4ML IJ SOSY
40.0000 mg | PREFILLED_SYRINGE | INTRAMUSCULAR | Status: DC
  Administered 2024-01-01 – 2024-01-03 (×3): 40 mg via SUBCUTANEOUS
  Filled 2024-01-01 (×3): qty 0.4

## 2024-01-01 MED ORDER — SODIUM CHLORIDE 0.9 % IV SOLN: INTRAVENOUS | Status: AC

## 2024-01-01 MED ORDER — STROKE: EARLY STAGES OF RECOVERY BOOK: Freq: Once | Status: DC

## 2024-01-01 MED ORDER — PANTOPRAZOLE SODIUM 40 MG PO TBEC: 40.0000 mg | DELAYED_RELEASE_TABLET | Freq: Every morning | ORAL | Status: DC

## 2024-01-01 MED ORDER — METOPROLOL SUCCINATE ER 25 MG PO TB24: 12.5000 mg | ORAL_TABLET | Freq: Every day | ORAL | Status: DC

## 2024-01-01 MED ORDER — PHENOBARBITAL 32.4 MG PO TABS
97.2000 mg | ORAL_TABLET | Freq: Every day | ORAL | Status: DC
  Administered 2024-01-02 – 2024-01-03 (×2): 97.2 mg via ORAL
  Filled 2024-01-01: qty 1
  Filled 2024-01-01: qty 3

## 2024-01-01 MED ORDER — MECLIZINE HCL 25 MG PO TABS: 25.0000 mg | ORAL_TABLET | Freq: Three times a day (TID) | ORAL | Status: DC | PRN

## 2024-01-01 MED ORDER — SODIUM CHLORIDE 0.9% FLUSH
3.0000 mL | Freq: Once | INTRAVENOUS | Status: AC
  Administered 2024-01-01: 3 mL via INTRAVENOUS

## 2024-01-01 NOTE — ED Notes (Signed)
 Pt in CT at this time.

## 2024-01-01 NOTE — H&P (Signed)
 History and Physical    Edward Thompson FMW:[id number] DOB: 03/10/1951 DOA: 01/01/2024  PCP: Albina GORMAN Dine, MD (Confirm with patient/family/NH records and if not entered, this has to be entered at [facility] point of entry) Patient coming from: Home  I have personally briefly reviewed patient's old medical records in [facility]  Chief Complaint: Right sided weakness, right side facial droop  HPI: Edward Thompson is a 73 y.o. 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  Patient was last known yesterday evening.  This morning, patient was found to be having a weaker right hand and facial droop and his speech was slurred as well.  And around noontime family called EMS.  At baseline, patient has been wheelchair-bound for last 6 years for progressively worsening of bilateral lower extremity weakness.  Currently, patient mentation  in and out as per daughter at bedside, but he does complain about right-sided weakness however his speech was very slurred and not very discernible.  Confirmed with daughter that patient takes aspirin  and Plavix  at baseline.  No seizure activity was seen by family member today.  ED Course: Afebrile, no tachycardia none hypotension blood pressure 140/60 O2 saturation 98% on room air.  CT head showed subacute left thalamus and basal ganglia infarct and MRI showed 2.2 x 1.0 cm left basal ganglia and corona radiata stroke.  Blood work showed WBC 8.1 hemoglobin 12.2 BUN 15 creatinine 0.6, UA showed 3+ WBC.  Patient was given ceftriaxone  in the ED.  Review of Systems: Unable to perform, patient has significant  slurred speech.  Past Medical History:  Diagnosis Date   CAD (coronary artery disease)    Carotid stenosis, left 08/2017   Diabetes mellitus without complication (HCC)    Foot drop    Fracture of neck (HCC) 2008   fell off a roof. required halo x 4 months. also fractured alot of vertebrae   Heart attack (HCC) 2015   Heart disease    Hepatitis    6th grade    Hyperlipidemia    Hypertension    Myocardial infarction acute (HCC) 2015   Seizures (HCC)    taking phenobarbitol and dilantin . LAST SEIZURE WAS 2016. well controlled on meds   Sleep apnea    USES CPAP   Syncope 2019   Vertigo    Viral meningitis     Past Surgical History:  Procedure Laterality Date   CARDIAC SURGERY     COLONOSCOPY     COLONOSCOPY WITH PROPOFOL  N/A 03/11/2021   Procedure: COLONOSCOPY WITH PROPOFOL ;  Surgeon: Toledo, Ladell POUR, MD;  Location: [facility] ENDOSCOPY;  Service: Gastroenterology;  Laterality: N/A;   CORONARY ANGIOPLASTY     PATIENT UNAWARE OF THIS   CORONARY ARTERY BYPASS GRAFT  2015   ENDARTERECTOMY Left 08/19/2017   Procedure: ENDARTERECTOMY CAROTID;  Surgeon: Jama Cordella MATSU, MD;  Location: [facility] ORS;  Service: Vascular;  Laterality: Left;   ESOPHAGOGASTRODUODENOSCOPY N/A 03/11/2021   Procedure: ESOPHAGOGASTRODUODENOSCOPY (EGD);  Surgeon: Toledo, Ladell POUR, MD;  Location: [facility] ENDOSCOPY;  Service: Gastroenterology;  Laterality: N/A;  DM   HERNIA REPAIR Right 1985   inguinal     reports that he has never smoked. He has never used smokeless  tobacco. He reports that he does not drink alcohol and does not use drugs.  No Known Allergies  Family History  Problem Relation Age of Onset   Heart disease Mother    Heart attack Father      Prior to Admission medications   Medication Sig Start Date End Date Taking? Authorizing Provider  Accu-Chek Softclix Lancets lancets TEST BLOOD SUGAR THREE TIMES DAILY 08/27/22   Fernand Fredy RAMAN, MD  acetaminophen  (TYLENOL ) 500 MG tablet Take 500 mg by mouth  every 6 (six) hours as needed.    [provider]  aspirin  EC 81 MG tablet Take 81 mg by mouth at bedtime.    [provider]  Blood Glucose Monitoring Suppl (ACCU-CHEK GUIDE) w/Device KIT  04/24/21   [provider]  clopidogrel  (PLAVIX ) 75 MG tablet TAKE 1 TABLET EVERY DAY 12/19/23   Scoggins, Hospital doctor, NP  doxycycline (ADOXA) 100 MG tablet Take 100 mg by mouth 2 (two) times daily.    [provider]  furosemide  (LASIX ) 20 MG tablet Take 1 tablet by mouth once daily 12/29/23   Fernand Alter A, MD  glucose blood (ACCU-CHEK GUIDE) test strip USE WITH DIABETIC METER TO TEST BLOOD SUGAR THREE TIMES DAILY 08/27/22   Fernand Fredy RAMAN, MD  isosorbide  mononitrate (IMDUR ) 30 MG 24 hr tablet TAKE 1 TABLET EVERY DAY 04/18/23   Fernand Alter LABOR, MD  meclizine  (ANTIVERT ) 25 MG tablet Take 1 tablet (25 mg total) by mouth in the morning, at noon, in the evening, and at bedtime. TAKE 1 TABLET FOUR TIMES DAILY 04/18/23 04/12/24  Albina RAMAN Dine, MD  metFORMIN  (GLUCOPHAGE ) 500 MG tablet Take 1 tablet (500 mg total) by mouth every morning. 10/31/23   Albina RAMAN Dine, MD  metoprolol  succinate (TOPROL -XL) 25 MG 24 hr tablet Take 0.5 tablets (12.5 mg total) by mouth daily. 02/11/23   Alexander, Natalie, DO  nitroGLYCERIN  (NITROSTAT ) 0.4 MG SL tablet Place 0.4 mg under the tongue every 5 (five) minutes as needed for chest pain. 08/22/13   [provider]  pantoprazole  (PROTONIX ) 40 MG tablet Take 1 tablet (40 mg total) by mouth every morning. 10/28/23   Fernand Alter LABOR, MD  PHENobarbital  (LUMINAL) 97.2 MG tablet Take 1 tablet (97.2 mg total) by mouth at bedtime. 10/31/23 10/30/24  Albina RAMAN Dine, MD  phenytoin  (DILANTIN ) 100 MG ER capsule Take 100 mg by mouth 3 (three) times daily. 04/05/23   [provider]  phenytoin  (DILANTIN ) 200 MG ER capsule Take 1 capsule (200 mg total) by mouth daily. Take with 1 capsule 30 mg dose for total daily dose 230 mg 10/31/23 10/30/24   Tejan-Sie, S Ahmed, MD  phenytoin  (DILANTIN ) 30 MG ER capsule Take 1 capsule (30 mg total) by mouth daily. Take with 1 capsule 200 mg dose for total daily dose 230 mg 10/07/23 04/04/24  Albina RAMAN Dine, MD  rosuvastatin  (CRESTOR ) 40 MG tablet Take 1 tablet (40 mg total) by mouth at bedtime. 11/03/23   Fernand Alter LABOR, MD  senna-docusate (SENOKOT-S) 8.6-50 MG tablet Take 1 tablet by mouth at bedtime. 08/10/21   Dickie Begun, MD    Physical Exam: Vitals:   01/01/24 1318 01/01/24 1530  BP: (!) 143/67 131/73  Pulse: 96 81  Resp: 18 17  Temp: 98.2 F (36.8 C)   TempSrc: Oral   SpO2: 99% 100%  Weight: 88.5 kg   Height: 5' 4 (1.626 m)     Constitutional: NAD, calm, comfortable Vitals:   01/01/24 1318 01/01/24  1530  BP: (!) 143/67 131/73  Pulse: 96 81  Resp: 18 17  Temp: 98.2 F (36.8 C)   TempSrc: Oral   SpO2: 99% 100%  Weight: 88.5 kg   Height: 5' 4 (1.626 m)    Eyes: PERRL, lids and conjunctivae normal ENMT: Mucous membranes are moist. Posterior pharynx clear of any exudate or lesions.Normal dentition.  Neck: normal, supple, no masses, no thyromegaly Respiratory: clear to auscultation bilaterally, no wheezing, no crackles. Normal respiratory effort. No accessory muscle use.  Cardiovascular: Regular rate and rhythm, no murmurs / rubs / gallops. No extremity edema. 2+ pedal pulses. No carotid bruits.  Abdomen: no tenderness, no masses palpated. No hepatosplenomegaly. Bowel sounds positive.  Musculoskeletal: no clubbing / cyanosis. No joint deformity upper and lower extremities. Good ROM, no contractures. Normal muscle tone.  Skin: no rashes, lesions, ulcers. No induration Neurologic: Right-sided facial droop and right-sided tongue deviation. Sensation intact, DTR normal.  4/5 on right arm and right hand compared to 5/5 on the left side, muscle strength on lower extremity 4/5 bilaterally.  Psychiatric: Normal judgment and insight. Alert and oriented x 3. Normal mood.   Labs on  Admission: I have personally reviewed following labs and imaging studies  CBC: Recent Labs  Lab 01/01/24 1321  WBC 8.1  NEUTROABS 5.7  HGB 12.2*  HCT 37.2*  MCV 88.8  PLT 196   Basic Metabolic Panel: Recent Labs  Lab 01/01/24 1321  NA 135  K 3.6  CL 103  CO2 23  GLUCOSE 136*  BUN 15  CREATININE 0.67  CALCIUM  9.1   GFR: Estimated Creatinine Clearance: 82.5 mL/min (by C-G formula based on SCr of 0.67 mg/dL). Liver Function Tests: Recent Labs  Lab 01/01/24 1321  AST 16  ALT 12  ALKPHOS 83  BILITOT 0.6  PROT 7.7  ALBUMIN 4.1   No results for input(s): LIPASE, AMYLASE in the last 168 hours. No results for input(s): AMMONIA in the last 168 hours. Coagulation Profile: Recent Labs  Lab 01/01/24 1321  INR 1.2   Cardiac Enzymes: No results for input(s): CKTOTAL, CKMB, CKMBINDEX, TROPONINI in the last 168 hours. BNP (last 3 results) No results for input(s): PROBNP in the last 8760 hours. HbA1C: No results for input(s): HGBA1C in the last 72 hours. CBG: Recent Labs  Lab 01/01/24 1314  GLUCAP 138*   Lipid Profile: No results for input(s): CHOL, HDL, LDLCALC, TRIG, CHOLHDL, LDLDIRECT in the last 72 hours. Thyroid  Function Tests: No results for input(s): TSH, T4TOTAL, FREET4, T3FREE, THYROIDAB in the last 72 hours. Anemia Panel: No results for input(s): VITAMINB12, FOLATE, FERRITIN, TIBC, IRON, RETICCTPCT in the last 72 hours. Urine analysis:    Component Value Date/Time   COLORURINE YELLOW (A) 01/01/2024 1504   APPEARANCEUR HAZY (A) 01/01/2024 1504   APPEARANCEUR Clear 08/21/2021 1046   LABSPEC 1.017 01/01/2024 1504   PHURINE 6.0 01/01/2024 1504   GLUCOSEU NEGATIVE 01/01/2024 1504   HGBUR SMALL (A) 01/01/2024 1504   BILIRUBINUR NEGATIVE 01/01/2024 1504   BILIRUBINUR Negative 07/26/2023 1602   BILIRUBINUR Negative 08/21/2021 1046   KETONESUR 5 (A) 01/01/2024 1504   PROTEINUR NEGATIVE 01/01/2024 1504    UROBILINOGEN 0.2 07/26/2023 1602   NITRITE NEGATIVE 01/01/2024 1504   LEUKOCYTESUR MODERATE (A) 01/01/2024 1504    Radiological Exams on Admission: MR BRAIN WO CONTRAST Result Date: 01/01/2024 CLINICAL DATA:  Provided history: Right-sided facial droop. Aphasia. Evaluate CVA. EXAM: MRI HEAD WITHOUT CONTRAST TECHNIQUE: Multiplanar, multiecho pulse sequences of the brain and surrounding structures were  obtained without intravenous contrast. COMPARISON:  Head CT 01/01/2024.  Brain MRI 02/09/2023. FINDINGS: The examination is intermittently motion degraded. Most notably, the axial T2 sequence is moderate-to-severely motion degraded, the axial FLAIR sequence is severely motion degraded, the axial SWI sequence is moderately motion degraded and the axial T1 sequence is moderately motion degraded. Within this limitation, findings are as follows. Brain: Generalized cerebral atrophy. 2.2 x 1.0 cm acute infarct within the left basal ganglia/corona radiata. Moderate multifocal T2 FLAIR hyperintense signal abnormality elsewhere in the cerebral white matter, nonspecific but compatible with chronic small vessel ischemic disease. No evidence of an intracranial mass. No chronic intracranial blood products. No extra-axial fluid collection. No midline shift. Vascular: Maintained flow voids within the proximal large arterial vessels. Skull and upper cervical spine: No focal worrisome marrow lesion. Sinuses/Orbits: No mass or acute finding within the imaged orbits. Minimal mucosal thickening within the right maxillary sinus. IMPRESSION: 1. Significantly motion degraded examination. Within this limitation, findings are as follows. 2. 2.2 x 1.0 cm acute infarct within the left basal ganglia/corona radiata. 3. Moderate background cerebral white matter chronic small vessel ischemic disease. 4. Generalized cerebral atrophy. 5. Minor right maxillary sinus mucosal thickening. Electronically Signed   By: Rockey Childs D.O.   On: 01/01/2024  16:50   CT HEAD WO CONTRAST Result Date: 01/01/2024 CLINICAL DATA:  Aphasia. EXAM: CT HEAD WITHOUT CONTRAST TECHNIQUE: Contiguous axial images were obtained from the base of the skull through the vertex without intravenous contrast. RADIATION DOSE REDUCTION: This exam was performed according to the departmental dose-optimization program which includes automated exposure control, adjustment of the mA and/or kV according to patient size and/or use of iterative reconstruction technique. COMPARISON:  02/08/2023 FINDINGS: Brain: No evidence of intracranial hemorrhage, hydrocephalus, extra-axial collection, or mass lesion/mass effect. Mild chronic small vessel disease noted. An ill-defined area of decreased attenuation is seen involving the left thalamus, basal ganglia, and deep periventricular white matter. This was not seen on previous study, and likely represents a subacute infarct. Vascular:  No hyperdense vessel or other acute findings. Skull: No evidence of fracture or other significant bone abnormality. Sinuses/Orbits:  No acute findings. Other: None. IMPRESSION: Probable subacute infarct involving the left thalamus, basal ganglia, and deep periventricular white matter. Consider brain MRI without and with contrast for further evaluation. Cerebral atrophy and chronic small vessel disease. Electronically Signed   By: Norleen DELENA Kil M.D.   On: 01/01/2024 14:20    EKG: Ordered  Assessment/Plan Principal Problem:   Stroke [facility])  (please populate well all problems here in Problem List. (For example, if patient is on BP meds at home and you resume or decide to hold them, it is a problem that needs to be her. Same for CAD, COPD, HLD and so on)  Acute right-sided paresis Acute dysarthria Left-sided basal ganglia and corona radiata stroke, likely thrombotic - Continue aspirin  Plavix  - Ordered CTA head and neck -Allow permissive hypertension - Echocardiogram - Will hold patient n.p.o. for now, as there is  suspicion patient also has dysphagia.  Continue IV fluid - Speech evaluation, PT OT evaluation  Acute metabolic encephalopathy - Likely from the concurrent UTI - Continue ceftriaxone  - Other DDx, Dilantin  level within normal limits and the family did not observe any seizure activity, and family reported patient has been seizure-free for at least several years, currently there is low suspicion for breakthrough seizure.  Chronic neurogenic bladder Chronic urinary retention on self-catheterization - For now, bladder scan every shift and straight cath for  volumes more than 250 mL.  Peripheral edema - Chronic as per family - Hold off Lasix  today for permissive hypertension, consider resume Lasix  tomorrow. - Echo is pending - Check TSH  IIDM - Hold off metformin  - Start SSI  Chronic ambulation impairment wheelchair-bound progressive genetic neuromuscular disease - Will have to evaluate this tomorrow  Total time spent on patient care 75 minutes  DVT prophylaxis: Lovenox  Code Status: Full code Family Communication: Daughter at bedside Disposition Plan: Patient is sick with surgical bed significant functional impairment, expect prolonged hospital stay for extensive PT OT evaluation, expect more than 2 midnight hospital stays despite patient discharge to acute rehab Consults called: None Admission status: PCU admit   Cort ONEIDA Mana MD [facility] Pager [phone]  If 7PM-7AM, please contact night-coverage [email or web address] Password [email or web address]  01/01/2024, 5:36 PM

## 2024-01-01 NOTE — ED Notes (Addendum)
 LKW 2030 12/31/2023. Established by daughter and patient at bedside. This is when the pt was going to make his dinner.

## 2024-01-01 NOTE — ED Notes (Signed)
 Patient explained to RN that he normally uses a self in-out catheter to relieve urine. RN and NT at bedside to perform In-out cath to obtain urine. returned.

## 2024-01-01 NOTE — ED Notes (Signed)
 Patients daughter gave more information to MD that patient may have been normal before church around 0930am 01/01/2024.

## 2024-01-01 NOTE — ED Triage Notes (Signed)
 Pt presents to the ED via POV from home with daughter. Pt has a new right sided facial droop as well as slurred speech.  Spoke with Claudene, MD and orders placed accordingly. CBG 138

## 2024-01-01 NOTE — ED Triage Notes (Signed)
 Pt comes via EMS from home with possible UTI and pt had slurred speech. Pt is Alert and able to respond. Pt answering all questions correctly. Pt states he just felt tired. Daughter also said deficit on right side but pt performed with EMS ok. No slurring with EMS. No droop noted.   Unsure last known well. Daughter came to get pt and found him like this.   147/78 80 CBG 161 99.1 95% RA

## 2024-01-01 NOTE — ED Provider Notes (Addendum)
 [facility] Provider Note    Event Date/Time   First MD Initiated Contact with Patient 01/01/24 1401     (approximate) after initial CT head   History   Aphasia   HPI  Edward Thompson is a 73 y.o. male who presents to the ED for evaluation of Aphasia   I reviewed [facility] neurology clinic visit from 3 weeks ago.  History of CAD, epilepsy on phenobarbital  and phenytoin , remote dens fracture, polyneuropathy.  DAPT with aspirin  and Plavix  without AC.  Patient presents due to slurred speech and right-sided facial droop.  Daughter provides vast majority of history at the bedside.  She went to see the patient this morning to take him to church, like normal, but he reported not feeling well and refused to go.  He was fluent at that time.  This was around 9:30 AM  When she went to check on him after church, he had presenting symptoms with right-sided facial droop, slurred speech for which they bring patient to the ED.  They report patient had a fall last week where he fell on his bottom and was able to get himself back into his wheelchair.  He lives at home with family and is wheelchair dependent.  Physical Exam   Triage Vital Signs: ED Triage Vitals [01/01/24 1318]  Encounter Vitals Group     BP (!) 143/67     Girls Systolic BP Percentile      Girls Diastolic BP Percentile      Boys Systolic BP Percentile      Boys Diastolic BP Percentile      Pulse Rate 96     Resp 18     Temp 98.2 F (36.8 C)     Temp Source Oral     SpO2 99 %     Weight 195 lb (88.5 kg)     Height 5' 4 (1.626 m)     Head Circumference      Peak Flow      Pain Score 0     Pain Loc      Pain Education      Exclude from Growth Chart     Most recent vital signs: Vitals:   01/01/24 1318  BP: (!) 143/67  Pulse: 96  Resp: 18  Temp: 98.2 F (36.8 C)  SpO2: 99%    General: Awake, no distress.  Right-sided facial asymmetry and slurred speech are obvious.  Dysarthria without  clear aphasia CV:  Good peripheral perfusion.  Resp:  Normal effort.  Abd:  No distention.  MSK:  No deformity noted.  Bilateral lower extremity edema and likely chronic venous stasis dermatitis symmetrically. Neuro:  No focal deficits appreciated. Other:     ED Results / Procedures / Treatments   Labs (all labs ordered are listed, but only abnormal results are displayed) Labs Reviewed  PROTIME-INR - Abnormal; Notable for the following components:      Result Value   Prothrombin Time 16.2 (*)    All other components within normal limits  CBC - Abnormal; Notable for the following components:   RBC 4.19 (*)    Hemoglobin 12.2 (*)    HCT 37.2 (*)    All other components within normal limits  COMPREHENSIVE METABOLIC PANEL WITH GFR - Abnormal; Notable for the following components:   Glucose, Bld 136 (*)    All other components within normal limits  URINALYSIS, ROUTINE W REFLEX MICROSCOPIC - Abnormal; Notable for the following components:  Color, Urine YELLOW (*)    APPearance HAZY (*)    Hgb urine dipstick SMALL (*)    Ketones, ur 5 (*)    Leukocytes,Ua MODERATE (*)    Bacteria, UA MANY (*)    All other components within normal limits  CBG MONITORING, ED - Abnormal; Notable for the following components:   Glucose-Capillary 138 (*)    All other components within normal limits  APTT  DIFFERENTIAL  ETHANOL  PHENYTOIN  LEVEL, TOTAL  PHENOBARBITAL  LEVEL  CBG MONITORING, ED    EKG   RADIOLOGY CT head interpreted by me without ICH  Official radiology report(s): CT HEAD WO CONTRAST Result Date: 01/01/2024 CLINICAL DATA:  Aphasia. EXAM: CT HEAD WITHOUT CONTRAST TECHNIQUE: Contiguous axial images were obtained from the base of the skull through the vertex without intravenous contrast. RADIATION DOSE REDUCTION: This exam was performed according to the departmental dose-optimization program which includes automated exposure control, adjustment of the mA and/or kV according to  patient size and/or use of iterative reconstruction technique. COMPARISON:  02/08/2023 FINDINGS: Brain: No evidence of intracranial hemorrhage, hydrocephalus, extra-axial collection, or mass lesion/mass effect. Mild chronic small vessel disease noted. An ill-defined area of decreased attenuation is seen involving the left thalamus, basal ganglia, and deep periventricular white matter. This was not seen on previous study, and likely represents a subacute infarct. Vascular:  No hyperdense vessel or other acute findings. Skull: No evidence of fracture or other significant bone abnormality. Sinuses/Orbits:  No acute findings. Other: None. IMPRESSION: Probable subacute infarct involving the left thalamus, basal ganglia, and deep periventricular white matter. Consider brain MRI without and with contrast for further evaluation. Cerebral atrophy and chronic small vessel disease. Electronically Signed   By: Norleen DELENA Kil M.D.   On: 01/01/2024 14:20    PROCEDURES and INTERVENTIONS:  .Critical Care  Performed by: Claudene Rover, MD Authorized by: Claudene Rover, MD   Critical care provider statement:    Critical care time (minutes):  30   Critical care time was exclusive of:  Separately billable procedures and treating other patients   Critical care was necessary to treat or prevent imminent or life-threatening deterioration of the following conditions:  CNS failure or compromise   Critical care was time spent personally by me on the following activities:  Development of treatment plan with patient or surrogate, discussions with consultants, evaluation of patient's response to treatment, examination of patient, ordering and review of laboratory studies, ordering and review of radiographic studies, ordering and performing treatments and interventions, pulse oximetry, re-evaluation of patient's condition and review of old charts   Medications  cefTRIAXone  (ROCEPHIN ) 1 g in sodium chloride  0.9 % 100 mL IVPB (has no  administration in time range)  sodium chloride  flush (NS) 0.9 % injection 3 mL (3 mLs Intravenous Given 01/01/24 1407)     IMPRESSION / MDM / ASSESSMENT AND PLAN / ED COURSE  I reviewed the triage vital signs and the nursing notes.  Differential diagnosis includes, but is not limited to, ICH, CVA, medication toxicity, metabolic encephalopathy, Bell's palsy  {Patient presents with symptoms of an acute illness or injury that is potentially life-threatening.  Patient presents with a few hours of right sided facial droop and dysarthria but outside the interventional window.  No ICH on CT head, which questions subacute ischemic infarct.  MRI brain pending at time of signout to oncoming physician with anticipation of admission for stroke workup.  Urine does have infectious features and will be sent for culture.  He does  have history of UTI.  While component of his presentation may be related to metabolic encephalopathy, considering the focal nature of his symptoms I have a higher suspicion for CVA.  Blood work is generally unrevealing without significant metabolic derangements, phenytoin  level is therapeutic, awaiting phenobarbital  level.  Clinical Course as of 01/01/24 1535  Sun Jan 01, 2024  1407 I consult with Dr. Voncile.  Recommends MRI, phenobarbital  and phenytoin  levels [DS]    Clinical Course User Index [DS] Claudene Rover, MD     FINAL CLINICAL IMPRESSION(S) / ED DIAGNOSES   Final diagnoses:  Facial droop  Dysarthria     Rx / DC Orders   ED Discharge Orders     None        Note:  This document was prepared using Dragon voice recognition software and may include unintentional dictation errors.   Claudene Rover, MD 01/01/24 1535    Claudene Rover, MD 01/19/24 [phone]

## 2024-01-02 ENCOUNTER — Inpatient Hospital Stay (HOSPITAL_COMMUNITY)
Admit: 2024-01-02 | Discharge: 2024-01-02 | Disposition: A | Discharge status: Home / Self Care | Attending: Internal Medicine

## 2024-01-02 DIAGNOSIS — I6389 Other cerebral infarction: Secondary | ICD-10-CM

## 2024-01-02 DIAGNOSIS — E1151 Type 2 diabetes mellitus with diabetic peripheral angiopathy without gangrene: Secondary | ICD-10-CM | POA: Diagnosis not present

## 2024-01-02 DIAGNOSIS — I639 Cerebral infarction, unspecified: Secondary | ICD-10-CM | POA: Diagnosis not present

## 2024-01-02 DIAGNOSIS — I7090 Unspecified atherosclerosis: Secondary | ICD-10-CM

## 2024-01-02 LAB — ECHOCARDIOGRAM COMPLETE
AR max vel: 3.89 cm2
AV Area VTI: 3.58 cm2
AV Area mean vel: 3.82 cm2
AV Mean grad: 2 mmHg
AV Peak grad: 3.2 mmHg
Ao pk vel: 0.9 m/s
Area-P 1/2: 4.15 cm2
Calc EF: 59.8 %
Height: 64 in
MV VTI: 3.64 cm2
S' Lateral: 2.3 cm
Single Plane A2C EF: 69.4 %
Single Plane A4C EF: 56.9 %
Weight: 3120 [oz_av]

## 2024-01-02 LAB — GLUCOSE, CAPILLARY
Glucose-Capillary: 95 mg/dL (ref 70–99)
Glucose-Capillary: 86 mg/dL (ref 70–99)
Glucose-Capillary: 172 mg/dL — ABNORMAL HIGH (ref 70–99)

## 2024-01-02 LAB — LIPID PANEL
Cholesterol: 112 mg/dL (ref 0–200)
HDL: 53 mg/dL (ref >40)
LDL Cholesterol: 50 mg/dL (ref 0–99)
Total CHOL/HDL Ratio: 2.1 ratio
Triglycerides: 45 mg/dL (ref <150)
VLDL: 9 mg/dL (ref 0–40)

## 2024-01-02 LAB — PHENOBARBITAL LEVEL: Phenobarbital: 24.2 ug/mL (ref 15.0–40.0)

## 2024-01-02 MED ORDER — ORAL CARE MOUTH RINSE
15.0000 mL | OROMUCOSAL | Status: DC | PRN
Start: 2024-01-02 — End: 2024-01-04

## 2024-01-02 MED ORDER — SODIUM CHLORIDE 0.9 % IV SOLN
2.0000 g | INTRAVENOUS | Status: DC
  Administered 2024-01-03 – 2024-01-04 (×2): 2 g via INTRAVENOUS
  Filled 2024-01-02 (×2): qty 20

## 2024-01-02 NOTE — Evaluation (Signed)
 Occupational Therapy Evaluation Patient Details Name: Edward Thompson MRN: [id number] DOB: 11-05-1950 Today's Date: 01/02/2024   History of Present Illness   Edward Thompson is a 73 y.o. 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 01/01/24: acute infarct within the left basal ganglia/corona  radiata.    Clinical Impressions Edward Thompson was seen for OT evaluation this date. Prior to hospital admission, pt was MOD I for w/c t/fs and ADLs. Pt lives with son and daughter in law. R shoulder strength deficits noted. Pt currently requires CGA +RW for bed>chair t/f and sit<>stand at chair height. SUPERVISION don B shoes in sitting. Educated on HEP for RUE strengthening. Pt would benefit from skilled OT to address noted impairments and functional limitations (see below for any additional details). Upon hospital discharge, recommend OT follow up and initial 24/7 supervision.    If plan is discharge home, recommend the following:   A little help with bathing/dressing/bathroom;Help with stairs or ramp for entrance     Functional Status Assessment   Patient has had a recent decline in their functional status and demonstrates the ability to make significant improvements in function in a reasonable and predictable amount of time.     Equipment Recommendations   BSC/3in1     Recommendations for Other Services         Precautions/Restrictions   Precautions Precautions: Fall Recall of Precautions/Restrictions: Intact Restrictions Weight Bearing Restrictions Per Provider Order: No     Mobility Bed Mobility                General bed mobility comments: not tested    Transfers Overall transfer level: Needs assistance Equipment used: Rolling walker (2 wheels) Transfers: Sit to/from Stand Sit to Stand: Contact guard assist                  Balance Overall balance assessment: Needs assistance Sitting-balance support: No upper extremity supported, Feet supported Sitting balance-Leahy Scale: Fair     Standing balance support: Bilateral upper extremity supported Standing balance-Leahy Scale: Fair                             ADL either performed or assessed with clinical judgement   ADL Overall ADL's : Needs assistance/impaired                                       General ADL Comments: CGA + RW for simulated BSC t/f. SUPERVISION don B shoes in sitting.     Vision         Perception         Praxis         Pertinent Vitals/Pain Pain Assessment Pain Assessment: No/denies pain     Extremity/Trunk Assessment Upper Extremity Assessment Upper Extremity Assessment: RUE deficits/detail RUE Deficits / Details: 3-/5 R shoulder flexion. 4+/5 elbow flexion/extension RUE Sensation: WNL   Lower Extremity Assessment Lower Extremity Assessment: Generalized weakness RLE Deficits / Details: Pt reports weakness greater than baseline. Difficult to assess accuracy of RLE weakness to LLE due to baseline functional weakness  and history RLE Sensation: decreased light touch       Communication Communication Communication: Impaired Factors Affecting Communication: Reduced clarity of speech   Cognition Arousal: Alert Behavior During Therapy: WFL for tasks assessed/performed Cognition: No apparent impairments                               Following commands: Intact       Cueing  General Comments   Cueing Techniques: Verbal cues      Exercises     Shoulder Instructions      Home Living Family/patient expects to be discharged to:: Private  residence Living Arrangements: Other relatives;Children Available Help at Discharge: Family;Available 24 hours/day Type of Home: House Home Access: Ramped entrance     Home Layout: One level     Bathroom Shower/Tub: Chief Strategy Officer: Standard Bathroom Accessibility: Yes How Accessible: Accessible via wheelchair Home Equipment: Wheelchair - manual;Grab bars - tub/shower;Shower seat          Prior Functioning/Environment Prior Level of Function : Independent/Modified Independent             Mobility Comments: pt reports manual w/c t/f independently via stand pivot. ADLs Comments: mod-I for ADL's. Family assists with IADL's.    OT Problem List: Decreased strength;Decreased activity tolerance;Impaired UE functional use   OT Treatment/Interventions: Self-care/ADL training;Therapeutic exercise;Neuromuscular education;Energy conservation;DME and/or AE instruction;Therapeutic activities      OT Goals(Current goals can be found in the care plan section)   Acute Rehab OT Goals Patient Stated Goal: to go home OT Goal Formulation: With patient Time For Goal Achievement: 01/16/24 Potential to Achieve Goals: Good ADL Goals Pt Will Perform Grooming: with modified independence;sitting Pt Will Perform Lower Body Dressing: with modified independence;sitting/lateral leans Pt Will Transfer to Toilet: with modified independence;stand pivot transfer;bedside commode   OT Frequency:  Min 3X/week    Co-evaluation              AM-PAC OT 6 Clicks Daily Activity     Outcome Measure Help from another person eating meals?: None Help from another person taking care of personal grooming?: A Little Help from another person toileting, which includes using toliet, bedpan, or urinal?: A Little Help from another person bathing (including washing, rinsing, drying)?: A Little Help from another person to put on and taking off regular upper body clothing?: None Help from  another person to put on and taking off regular lower body clothing?: A Little 6 Click Score: 20   End of Session Equipment Utilized During Treatment: Gait belt Nurse Communication: Mobility status  Activity Tolerance: Patient tolerated treatment well Patient left: in chair;with call bell/phone within reach;with chair alarm set  OT Visit Diagnosis: Other abnormalities of gait and mobility (R26.89);Muscle weakness (generalized) (M62.81)                Time: 9097-9081 OT Time Calculation (min): 16 min Charges:  OT General Charges $OT Visit: 1 Visit OT Evaluation $OT Eval Low Complexity: 1 Low  Elston Slot, M.S. OTR/L  01/02/24, 11:16 AM  ascom [phone]

## 2024-01-02 NOTE — Evaluation (Signed)
 Physical Therapy Evaluation Patient Details Name: Edward Thompson MRN: [id number] DOB: 1951-05-04 Today's Date: 01/02/2024  History of Present Illness  Edward Thompson is a 73 y.o. 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 01/01/24: acute infarct within the left basal ganglia/corona  radiata.   Clinical Impression  Pt admitted with above diagnosis. Pt currently with functional limitations due to the deficits listed below (see PT Problem List). Pt received upright in bed agreeable to PT. Pt with difficulty with speech due to R facial droop but able to accurately articulate PLOF per EMR and pt's daughter corroborating pt's subjective report post session. PTA pt is mod-I with manual w/c mobility with stand pivot transfers without AD. Set up assist fro ADL's and family support with IADL's. Always has 24/7 support at home.   To date, pt is able to exit bed with bed features with increased time. Set up assist to don shoes. Pt able to lean anterior to Velcro straps. Pt does have some diminished sensation to LT along medial knee and dysmetria with RUE FTN. RAMPS intact bilaterally. Unable to get true accuracy of RLE weakness compared to LLE due to baseline strength deficits from [facility]. Is able to stand at bedside with bouts of momentum and CGA without support and stand/step pivot to recliner. OT entering room at this time. PT able to demo x3 STS at supervision level to RW at recliner. Does have LUE bias to utilize likely due to RUE weakness from CVA but completes without issue. Pt left in care of OT. Anticipate pt is not far from baseline despite R  sided weakness from CVA. PT to recommend f/u recs to address acute strength deficits to maximize return to PLOF. Continue to encourage 24/7 assist/supervision from family. Educated pt and daughter on recommendations.      If plan is discharge home, recommend the following: A little help with walking and/or transfers;A little help with bathing/dressing/bathroom;Assistance with cooking/housework;Assist for transportation;Help with stairs or ramp for entrance   Can travel by private vehicle        Equipment Recommendations None recommended by PT  Recommendations for Other Services       Functional Status Assessment Patient has had a recent decline in their functional status and demonstrates the ability to make significant improvements in function in a reasonable and predictable amount of time.     Precautions / Restrictions Precautions Precautions: Fall Recall of Precautions/Restrictions: Intact Restrictions Weight Bearing Restrictions Per Provider Order: No      Mobility  Bed Mobility Overal bed mobility: Needs Assistance Bed Mobility: Supine to Sit     Supine to sit: HOB elevated, Used rails     General bed mobility comments: increased time Patient Response: Cooperative, Flat affect  Transfers Overall transfer level: Needs assistance Equipment used: None, Rolling walker (2 wheels) Transfers: Sit to/from Stand, Bed to chair/wheelchair/BSC Sit to Stand: Contact guard assist           General transfer comment: no physical assist for STS at bedside and transfer to recliner. x3 STS at recliner using RW.    Ambulation/Gait               General Gait Details: does not  ambulate at baseline  Stairs            Wheelchair Mobility     Tilt Bed Tilt Bed Patient Response: Cooperative, Flat affect  Modified Rankin (Stroke Patients Only)       Balance Overall balance assessment: Needs assistance Sitting-balance support: No upper extremity supported,  Feet supported Sitting balance-Leahy Scale: Fair       Standing balance-Leahy Scale: Fair Standing balance comment: can stand without UE support at bedside statically                             Pertinent Vitals/Pain Pain Assessment Pain Assessment: No/denies pain    Home Living Family/patient expects to be discharged to:: Private residence Living Arrangements: Other relatives;Children Available Help at Discharge: Family;Available 24 hours/day Type of Home: House Home Access: Ramped entrance       Home Layout: One level Home Equipment: Wheelchair - manual;Grab bars - tub/shower;Shower seat      Prior Function               Mobility Comments: pt reports manual w/c t/f independently via stand pivot. ADLs Comments: mod-I for ADL's. Family assists with IADL's.     Extremity/Trunk Assessment   Upper Extremity Assessment Upper Extremity Assessment: RUE deficits/detail RUE Deficits / Details: dysmetria with FTN RUE Sensation: WNL    Lower Extremity Assessment Lower Extremity Assessment: Generalized weakness;RLE deficits/detail RLE Deficits / Details: Pt reports weakness greater than baseline. Difficult to assess accuracy of RLE weakness to LLE due to baseline functional weakness and history RLE Sensation: decreased light touch       Communication   Communication Communication: No apparent difficulties    Cognition Arousal: Alert Behavior During Therapy: WFL for tasks assessed/performed   PT - Cognitive impairments: No apparent impairments                         Following commands: Intact       Cueing Cueing Techniques: Verbal cues     General Comments      Exercises     Assessment/Plan    PT Assessment Patient needs continued PT services  PT Problem List Decreased strength;Decreased mobility;Decreased activity tolerance;Decreased balance       PT Treatment Interventions DME instruction;Therapeutic exercise;Balance  training;Neuromuscular re-education;Functional mobility training;Therapeutic activities;Patient/family education    PT Goals (Current goals can be found in the Care Plan section)  Acute Rehab PT Goals Patient Stated Goal: to improve R sided strength PT Goal Formulation: With patient Time For Goal Achievement: 01/16/24 Potential to Achieve Goals: Good    Frequency Min 4X/week     Co-evaluation               AM-PAC PT 6 Clicks Mobility  Outcome Measure Help needed turning from your back to your side while in a flat bed without using bedrails?: A Little Help needed moving from lying on your back to sitting on the side of a flat bed without using bedrails?: A Little Help needed moving to and from a bed to a chair (including a wheelchair)?: A Little Help needed standing up from a chair using your arms (e.g., wheelchair or bedside chair)?: A Little Help needed to walk in hospital room?: A Lot Help needed climbing 3-5 steps with a railing? : Total 6 Click Score: 15    End of Session Equipment Utilized During Treatment: Gait belt Activity Tolerance: Patient tolerated treatment  well Patient left: in chair;with call bell/phone within reach (in care of OT) Nurse Communication: Mobility status PT Visit Diagnosis: Muscle weakness (generalized) (M62.81);Other abnormalities of gait and mobility (R26.89);History of falling (Z91.81)    Time: 9152-9091 PT Time Calculation (min) (ACUTE ONLY): 21 min   Charges:   PT Evaluation $PT Eval Low Complexity: 1 Low   PT General Charges $$ ACUTE PT VISIT: 1 Visit         Dorina HERO. Fairly IV, PT, DPT Physical Therapist- [place]  [facility] 01/02/2024, 10:37 AM

## 2024-01-02 NOTE — Plan of Care (Signed)

## 2024-01-02 NOTE — Evaluation (Signed)
 Clinical/Bedside Swallow Evaluation Patient Details  Name: Edward Thompson MRN: [id number] Date of Birth: October 31, 1950  Today's Date: 01/02/2024 Time: SLP Start Time (ACUTE ONLY): 1200 SLP Stop Time (ACUTE ONLY): 1215 SLP Time Calculation (min) (ACUTE ONLY): 15 min  Past Medical History:  Past Medical History:  Diagnosis Date   CAD (coronary artery disease)    Carotid stenosis, left 08/2017   Diabetes mellitus without complication (HCC)    Foot drop    Fracture of neck (HCC) 2008   fell off a roof. required halo x 4 months. also fractured alot of vertebrae   Heart attack (HCC) 2015   Heart disease    Hepatitis    6th grade    Hyperlipidemia    Hypertension    Myocardial infarction acute (HCC) 2015   Seizures (HCC)    taking phenobarbitol and dilantin . LAST SEIZURE WAS 2016. well controlled on meds   Sleep apnea    USES CPAP   Syncope 2019   Vertigo    Viral meningitis    Past Surgical History:  Past Surgical History:  Procedure Laterality Date   CARDIAC SURGERY     COLONOSCOPY     COLONOSCOPY WITH PROPOFOL  N/A 03/11/2021   Procedure: COLONOSCOPY WITH PROPOFOL ;  Surgeon: Toledo, Ladell POUR, MD;  Location: [facility] ENDOSCOPY;  Service: Gastroenterology;  Laterality: N/A;   CORONARY ANGIOPLASTY     PATIENT UNAWARE OF THIS   CORONARY ARTERY BYPASS GRAFT  2015   ENDARTERECTOMY Left 08/19/2017   Procedure: ENDARTERECTOMY CAROTID;  Surgeon: Jama Cordella MATSU, MD;  Location: [facility] ORS;  Service: Vascular;  Laterality: Left;   ESOPHAGOGASTRODUODENOSCOPY N/A 03/11/2021   Procedure: ESOPHAGOGASTRODUODENOSCOPY (EGD);  Surgeon: Toledo, Ladell POUR, MD;  Location: [facility] ENDOSCOPY;  Service: Gastroenterology;  Laterality: N/A;  DM   HERNIA REPAIR Right 1985   inguinal   HPI:  Edward Thompson is a 73 y.o. male with medical history significant of CAD status post stenting on aspirin  and Plavix , seizure disorder, HTN, IIDM, progressive genetic neuromuscular disease with chronic bilateral lower  extremity weakness wheelchair-bound, neurogenic bladder on self cath 2 times a day, mild cognitive impairment, brought in by family member for evaluation of acute right-sided weakness right-sided facial droop and slurred speech. MRI: Significantly motion degraded examination. Within this  limitation, findings are as follows.  2. 2.2 x 1.0 cm acute infarct within the left basal ganglia/corona  radiata.  3. Moderate background cerebral white matter chronic small vessel  ischemic disease.  4. Generalized cerebral atrophy.  5. Minor right maxillary sinus mucosal thickening.    Assessment / Plan / Recommendation  Clinical Impression  Bedside swallow evaluation completed in the setting of concerns for impaired oral manipulation and increased risk for aspiration secondary to acute CVA. Right facial weakness present, with continual anterior loss of oral secretions on the right. Pt seen with trials of thin liquid via straw, pt already aware of aligning straw to left to facilitate delivery to oral cavity. Mastication of regular solid prolonged and effortful. Pt reported impaired sensation, efficiency with mastication. Education shared regarding initiation of chopped solids diet to aid oral phase. Pt reported understanding.  Recommend dys 2 chopped solids and thin liquids with supervision/assist. Given acute CVA (with oral motor impairment), baseline MCI, need for assistance with PO intake, and overall deconditioning, pt is at increased risk for aspiration; therefore, recommend STRICT aspiration precautions (slow rate, small bites, elevated HOB, and alert for PO intake). Physician and RN aware of recommendations. SLP will continue to follow.  SLP Visit Diagnosis: Dysphagia, oropharyngeal phase (R13.12)    Aspiration Risk  Mild aspiration risk    Diet Recommendation   Dysphagia 2 (chopped);Thin  Medication Administration: Whole meds with puree    Other  Recommendations Oral Care Recommendations: Oral care  QID;Staff/trained caregiver to provide oral care     Assistance Recommended at Discharge Intermittent Supervision/Assistance (in regards to motor speech support)  Functional Status Assessment Patient has had a recent decline in their functional status and demonstrates the ability to make significant improvements in function in a reasonable and predictable amount of time.  Frequency and Duration min 2x/week  2 weeks       Prognosis Prognosis for improved oropharyngeal function: Fair Barriers to Reach Goals: Cognitive deficits;Severity of deficits      Swallow Study   General Date of Onset: 01/02/24 HPI: Edward Thompson is a 73 y.o. 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 MRI: Significantly motion degraded examination. Within this  limitation, findings are as follows.  2. 2.2 x 1.0 cm acute infarct within the left basal ganglia/corona  radiata.  3. Moderate background cerebral white matter chronic small vessel  ischemic disease.  4. Generalized cerebral atrophy.  5. Minor right maxillary sinus mucosal thickening. Type of Study: Bedside Swallow Evaluation Previous Swallow Assessment: BSE in Oct 2024, with rec for thin liquids and regular solids Diet Prior to this Study: NPO Temperature Spikes Noted: No Respiratory Status: Room air History of Recent Intubation: No Behavior/Cognition: Alert;Cooperative Oral Cavity Assessment: Within Functional Limits Oral Care Completed by SLP: Recent completion by staff Oral Cavity - Dentition: Adequate natural dentition Vision: Functional for self-feeding Self-Feeding Abilities: Needs  assist;Needs set up Patient Positioning: Upright in bed Baseline Vocal Quality: Low vocal intensity Volitional Cough: Strong Volitional Swallow: Able to elicit    Oral/Motor/Sensory Function Overall Oral Motor/Sensory Function: Moderate impairment Facial ROM: Reduced right Facial Symmetry: Abnormal symmetry right Facial Strength: Reduced right Lingual ROM:  (grossly symmetrical - improved from initial H&P report) Lingual Symmetry: Within Functional Limits Lingual Strength: Within Functional Limits Mandible: Within Functional Limits   Ice Chips Ice chips: Not tested   Thin Liquid Thin Liquid: Within functional limits Presentation: Straw;Self Fed    Nectar Thick Nectar Thick Liquid: Not tested   Honey Thick Honey Thick Liquid: Not tested   Puree Puree: Not tested   Solid     Solid: Impaired Presentation: Self Fed Oral Phase Impairments: Impaired mastication Oral Phase Functional Implications: Impaired mastication;Prolonged oral transit Pharyngeal Phase Impairments:  (none)     [place] Sherol Sabas Clapp, MS, CCC-SLP Speech Language Pathologist Rehab Services; [facility] [place] [phone] (ascom)   [place] J Clapp 01/02/2024,1:28 PM

## 2024-01-02 NOTE — Progress Notes (Signed)
 Progress Note   Patient: Edward Thompson FMW:[id number] DOB: 06/14/1950 DOA: 01/01/2024     1 DOS: the patient was seen and examined on 01/02/2024   Brief hospital course:  HPI on admission: Edward Thompson is a 73 y.o. 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 ... See H&P for full HPI on admission & ED course.  Patient was found to have an acute CVA of the left basal ganglia / corona radiata on MRI brain.  UA was also consisted with infection.  Patient admitted for further evaluation and management of stroke and UTI.  Further hospital course and management as outlined below.   Consults: Neurology   Assessment and Plan:  Acute right-sided paresis Acute dysarthria Left-sided basal ganglia and corona radiata stroke, likely thrombotic MRI brain was motion degraded but showed 2.2 x 1.0 cm acute infarct within the left basal ganglia / corona Radiata, generalized atrophy, moderate chronic small vessel ischemic disease. CTA head/neck showed significant vascular disease:  1. Chronically occluded right P1. 2. Severe left paraclinoid ICA stenosis. 3. Severe bilateral intradural vertebral artery stenosis. 4. Moderate basilar artery stenosis ... Pt denies known hx of A-fib/flutter or having heart palpitations. --Neurology consulted --Follow pending Echo --Continue aspirin  Plavix  --Telemetry --Permissive hypertension for now --Resumed on diet per SLP --PT/OT recommend [facility] PT/OT --Stop IV fluids --Neuro checks   Acute metabolic encephalopathy Due to UTI - Continue ceftriaxone  - Other DDx, Dilantin  level within normal  limits and the family did not observe any seizure activity, and family reported patient has been seizure-free for at least several years, currently there is low suspicion for breakthrough seizure.   Chronic neurogenic bladder Chronic urinary retention on self-catheterization - For now, bladder scan every shift and straight cath for volumes more than 250 mL.   Peripheral edema - Chronic as per family. Pt reports this is stable at baseline. - Hold off Lasix  today for permissive hypertension - Echo is pending   Insulin -dependent type 2 diabetes - Hold off metformin  - Start SSI   Chronic ambulation impairment wheelchair-bound progressive genetic neuromuscular disease --PT/OT consulted as part of stroke work up      Subjective: Pt seen awake resting in bed today. He reports speech slurring and weakness are about that same, he does not note significant change or improvement today.  No other acute complaints.  Says his swelling looks about how it usually does.   Physical Exam: Vitals:   01/01/24 1948 01/02/24 0400 01/02/24 0731 01/02/24 1215  BP: 120/69 (!) 151/83 (!) 140/67 133/70  Pulse: 84 89 85 88  Resp: 18 16 16 18   Temp: 97.9 F (36.6 C) 98.1 F (36.7 C) 98.3 F (36.8 C) 98.2 F (36.8 C)  TempSrc: Oral Oral    SpO2: 97% 96% 96% 97%  Weight:      Height:       General exam: awake, alert, no acute distress, chronically ill appearing HEENT: poor dentition, moist mucus membranes, hearing grossly normal  Respiratory system: CTAB diminished bases, no wheezes, rales or rhonchi, normal respiratory effort. Cardiovascular system: normal S1/S2, RRR, +bilateral  pedal edema.   Gastrointestinal system: soft, NT, ND, no HSM felt, +bowel sounds. Central nervous system: A&O x3.  Right-sided weakness, slurred speech, facial droop Skin: dry, intact, normal temperature Psychiatry: normal mood, flat affect, judgement and insight appear normal   Data Reviewed:  Notable labs --   Micro  -- urine culture pending  Echo - EF 55-60%, grade 1 DD, no interatrial shunt detected  Imaging -- MRI brain and CTA head/neck as reviewed above in A&P  Family Communication: Pt declines for me to call anyone when asked.  Disposition: Status is: Inpatient Remains inpatient appropriate because: ongoing evaluation and remains on IV antibiotics pending urine culture   Planned Discharge Destination: Home with Home Health    Time spent: 45 minutes  Author: Burnard DELENA Cunning, DO 01/02/2024 3:42 PM  For on call review [email or web address].

## 2024-01-02 NOTE — Consult Note (Signed)
 NEUROLOGY CONSULT NOTE   Date of service: January 02, 2024 Patient Name: Edward Thompson MRN:  [id number] DOB:  1950/07/22 Chief Complaint: Right sided weakness Requesting Provider: Fausto Burnard LABOR, DO  History of Present Illness  Edward Thompson is a 73 y.o. male who has a past medical history of CAD (coronary artery disease), Carotid stenosis, left (08/2017), Diabetes mellitus without complication (HCC), Foot drop, Fracture of neck (HCC) (2008), Heart attack (HCC) (2015), Heart disease, Hepatitis, Hyperlipidemia, Hypertension, Myocardial infarction acute (HCC) (2015), Seizures (HCC), Sleep apnea, Syncope (2019), Vertigo, and Viral meningitis. As well as epilepsy (on phenobarbital  and phenytoin ) who presented to the hospital yesterday with new onset of slurred speech, right facial droop and right sided weakness. Vitals on arrival to the ED: BP 147/78; HR 80; CBG 161; Temp 99.1; 95% RA. Daughter states that he has not had a seizure for several years and that no seizure was witnessed prior to presentation.   Additional history from EDP note has been reviewed: She went to see the patient this morning to take him to church, like normal, but he reported not feeling well and refused to go.  He was fluent at that time.  This was around 9:30 AM. When she went to check on him after church, he had presenting symptoms with right-sided facial droop, slurred speech for which they bring patient to the ED. They report patient had a fall last week where he fell on his bottom and was able to get himself back into his wheelchair. He lives at home with family and is wheelchair dependent.  Daughter states at bedside today that the patient is also able to ambulate with a walker, but has chronic bilateral foot drop since he was in his 20's. She states that he never was conclusively diagnosed regarding the cause of his foot drop. He also has had chronic BLE edema for many years. She states that he has been diagnosed with  a genetic condition that to the best of her memory may account for his cardiac condition, his edema and his bilateral foot drop, but she is unable to recall the condition, or if any genetic syndrome was conclusively diagnosed. She does endorse that he had a positive genetic test at some point and that she will try to find the paperwork at home and bring it in. One of the patient's siblings as well as his mother suffered from heart conditions and lower extremity edema, and one of them also had chronic foot drop.    ROS  As per HPI.   Past History   Past Medical History:  Diagnosis Date   CAD (coronary artery disease)    Carotid stenosis, left 08/2017   Diabetes mellitus without complication (HCC)    Foot drop    Fracture of neck (HCC) 2008   fell off a roof. required halo x 4 months. also fractured alot of vertebrae   Heart attack (HCC) 2015   Heart disease    Hepatitis    6th grade    Hyperlipidemia    Hypertension    Myocardial infarction acute (HCC) 2015   Seizures (HCC)    taking phenobarbitol and dilantin . LAST SEIZURE WAS 2016. well controlled on meds   Sleep apnea    USES CPAP   Syncope 2019   Vertigo    Viral meningitis     Past Surgical History:  Procedure Laterality Date   CARDIAC SURGERY     COLONOSCOPY     COLONOSCOPY WITH PROPOFOL  N/A  03/11/2021   Procedure: COLONOSCOPY WITH PROPOFOL ;  Surgeon: Toledo, Ladell POUR, MD;  Location: [facility] ENDOSCOPY;  Service: Gastroenterology;  Laterality: N/A;   CORONARY ANGIOPLASTY     PATIENT UNAWARE OF THIS   CORONARY ARTERY BYPASS GRAFT  2015   ENDARTERECTOMY Left 08/19/2017   Procedure: ENDARTERECTOMY CAROTID;  Surgeon: Jama Cordella MATSU, MD;  Location: [facility] ORS;  Service: Vascular;  Laterality: Left;   ESOPHAGOGASTRODUODENOSCOPY N/A 03/11/2021   Procedure: ESOPHAGOGASTRODUODENOSCOPY (EGD);  Surgeon: Toledo, Ladell POUR, MD;  Location: [facility] ENDOSCOPY;  Service: Gastroenterology;  Laterality: N/A;  DM   HERNIA REPAIR Right 1985    inguinal    Family History: Family History  Problem Relation Age of Onset   Heart disease Mother    Heart attack Father     Social History  reports that he has never smoked. He has never used smokeless tobacco. He reports that he does not drink alcohol and does not use drugs.  No Known Allergies  Medications   Current Facility-Administered Medications:     stroke: early stages of recovery book, , Does not apply, Once, Laurita Manor T, MD   0.9 %  sodium chloride  infusion, , Intravenous, Continuous, Laurita Manor T, MD, Last Rate: 100 mL/hr at 01/02/24 0555, New Bag at 01/02/24 0555   acetaminophen  (TYLENOL ) tablet 650 mg, 650 mg, Oral, Q4H PRN **OR** acetaminophen  (TYLENOL ) 160 MG/5ML solution 650 mg, 650 mg, Per Tube, Q4H PRN **OR** acetaminophen  (TYLENOL ) suppository 650 mg, 650 mg, Rectal, Q4H PRN, Laurita Manor DASEN, MD   aspirin  EC tablet 81 mg, 81 mg, Oral, Daily, Laurita, Ping T, MD, 81 mg at 01/01/24 1747   [START ON 01/03/2024] cefTRIAXone  (ROCEPHIN ) 2 g in sodium chloride  0.9 % 100 mL IVPB, 2 g, Intravenous, Q24H, Griffith, Kelly A, DO   clopidogrel  (PLAVIX ) tablet 75 mg, 75 mg, Oral, Daily, Laurita, Ping T, MD, 75 mg at 01/01/24 1747   enoxaparin  (LOVENOX ) injection 40 mg, 40 mg, Subcutaneous, Q24H, Zhang, Ping T, MD, 40 mg at 01/01/24 2230   insulin  aspart (novoLOG ) injection 0-15 Units, 0-15 Units, Subcutaneous, TID WC, Zhang, Manor T, MD   labetalol  (NORMODYNE ) injection 10 mg, 10 mg, Intravenous, Q4H PRN, Laurita Manor T, MD   Oral care mouth rinse, 15 mL, Mouth Rinse, PRN, Mansy, Jan A, MD   pantoprazole  (PROTONIX ) injection 40 mg, 40 mg, Intravenous, Q24H, Zhang, Ping T, MD, 40 mg at 01/01/24 1749   PHENobarbital  (LUMINAL) tablet 97.2 mg, 97.2 mg, Oral, QHS, Zhang, Ping T, MD   phenytoin  (DILANTIN ) ER capsule 230 mg, 230 mg, Oral, Daily, Zhang, Ping T, MD   rosuvastatin  (CRESTOR ) tablet 40 mg, 40 mg, Oral, QHS, Zhang, Manor DASEN, MD   senna-docusate (Senokot-S) tablet 1 tablet, 1 tablet,  Oral, QHS, Laurita Manor T, MD  No current facility-administered medications on file prior to encounter.   Current Outpatient Medications on File Prior to Encounter  Medication Sig Dispense Refill   acetaminophen  (TYLENOL ) 500 MG tablet Take 500 mg by mouth every 6 (six) hours as needed.     aspirin  EC 81 MG tablet Take 81 mg by mouth at bedtime.     clopidogrel  (PLAVIX ) 75 MG tablet TAKE 1 TABLET EVERY DAY 90 tablet 0   furosemide  (LASIX ) 20 MG tablet Take 1 tablet by mouth once daily 30 tablet 0   isosorbide  mononitrate (IMDUR ) 30 MG 24 hr tablet TAKE 1 TABLET EVERY DAY 90 tablet 3   meclizine  (ANTIVERT ) 25 MG tablet Take 1 tablet (25 mg total)  by mouth in the morning, at noon, in the evening, and at bedtime. TAKE 1 TABLET FOUR TIMES DAILY 360 tablet 3   metFORMIN  (GLUCOPHAGE ) 500 MG tablet Take 1 tablet (500 mg total) by mouth every morning. 90 tablet 1   metoprolol  succinate (TOPROL -XL) 25 MG 24 hr tablet Take 0.5 tablets (12.5 mg total) by mouth daily.     nitroGLYCERIN  (NITROSTAT ) 0.4 MG SL tablet Place 0.4 mg under the tongue every 5 (five) minutes as needed for chest pain.     pantoprazole  (PROTONIX ) 40 MG tablet Take 1 tablet (40 mg total) by mouth every morning. 90 tablet 3   PHENobarbital  (LUMINAL) 97.2 MG tablet Take 1 tablet (97.2 mg total) by mouth at bedtime. 90 tablet 3   phenytoin  (DILANTIN ) 200 MG ER capsule Take 1 capsule (200 mg total) by mouth daily. Take with 1 capsule 30 mg dose for total daily dose 230 mg 90 capsule 3   phenytoin  (DILANTIN ) 30 MG ER capsule Take 1 capsule (30 mg total) by mouth daily. Take with 1 capsule 200 mg dose for total daily dose 230 mg 90 capsule 1   rosuvastatin  (CRESTOR ) 40 MG tablet Take 1 tablet (40 mg total) by mouth at bedtime. 90 tablet 3   senna-docusate (SENOKOT-S) 8.6-50 MG tablet Take 1 tablet by mouth at bedtime.     Accu-Chek Softclix Lancets lancets TEST BLOOD SUGAR THREE TIMES DAILY 300 each 11   Blood Glucose Monitoring Suppl  (ACCU-CHEK GUIDE) w/Device KIT      doxycycline (ADOXA) 100 MG tablet Take 100 mg by mouth 2 (two) times daily. Has not started yet     glucose blood (ACCU-CHEK GUIDE) test strip USE WITH DIABETIC METER TO TEST BLOOD SUGAR THREE TIMES DAILY 300 strip 3     Vitals   Vitals:   01/01/24 1948 01/02/24 0400 01/02/24 0731 01/02/24 1215  BP: 120/69 (!) 151/83 (!) 140/67 133/70  Pulse: 84 89 85 88  Resp: 18 16 16 18   Temp: 97.9 F (36.6 C) 98.1 F (36.7 C) 98.3 F (36.8 C) 98.2 F (36.8 C)  TempSrc: Oral Oral    SpO2: 97% 96% 96% 97%  Weight:      Height:        Body mass index is 33.47 kg/m.   Physical Exam   Constitutional: Elderly-appearing male in NAD Psych: Affect appropriate to situation.  Eyes: No scleral injection.  HENT: No OP obstruction. Coarse facial features.  Head: Normocephalic.  Respiratory: Effort normal, non-labored breathing.  Skin: Discolored skin patches to BUE Ext: Prominent nonpitting edema with pink discoloration of skin to BLE below knees, including the feet.   Neurologic Examination   Mental Status: Awake and alert. Fully oriented x 5. Thought content appropriate. Speech fluent, but with dysarthria. No naming deficits.  Able to follow all commands without difficulty. Cranial Nerves: II: Temporal visual fields intact with no extinction to DSS. PERRL. III,IV, VI: Mild to moderate bilateral ptosis. EOMI. No nystagmus. V: Temp sensation decreased on the right.  VII: Mild right facial droop VIII: Hearing intact to voice IX,X: Hypophonic speech XI: Symmetric XII: Mild dysarthria Motor: RUE: 4+/5 LUE: 4+/5 RLE: 4+/5 proximally, with bilateral foot drop (ADF 2/5, APF 3/5) LLE: 4+/5 proximally, with bilateral foot drop (ADF 2/5, APF 3/5) Sensory: Decreased temp sensation to RUE and RLE.  Deep Tendon Reflexes: Hypoactive (trace to 1+) throughout Cerebellar: No ataxia with FNF bilaterally Gait: Deferred   Labs/Imaging/Neurodiagnostic studies    CBC:  Recent Labs  Lab 01/01/24 1321  WBC 8.1  NEUTROABS 5.7  HGB 12.2*  HCT 37.2*  MCV 88.8  PLT 196   Basic Metabolic Panel:  Lab Results  Component Value Date   NA 135 01/01/2024   K 3.6 01/01/2024   CO2 23 01/01/2024   GLUCOSE 136 (H) 01/01/2024   BUN 15 01/01/2024   CREATININE 0.67 01/01/2024   CALCIUM  9.1 01/01/2024   GFRNONAA >60 01/01/2024   GFRAA >60 01/04/2020   Lipid Panel:  Lab Results  Component Value Date   LDLCALC 50 01/02/2024   HgbA1c:  Lab Results  Component Value Date   HGBA1C 5.7 (H) 10/26/2023   Urine Drug Screen:     Component Value Date/Time   LABOPIA NONE DETECTED 01/04/2020 1945   COCAINSCRNUR NONE DETECTED 01/04/2020 1945   LABBENZ NONE DETECTED 01/04/2020 1945   AMPHETMU NONE DETECTED 01/04/2020 1945   THCU NONE DETECTED 01/04/2020 1945   LABBARB POSITIVE (A) 01/04/2020 1945    Alcohol Level     Component Value Date/Time   ETH <15 01/01/2024 1411   INR  Lab Results  Component Value Date   INR 1.2 01/01/2024   APTT  Lab Results  Component Value Date   APTT 34 01/01/2024   AED levels:  Lab Results  Component Value Date   PHENYTOIN  14.3 01/01/2024   TTE: 1. Left ventricular ejection fraction, by estimation, is 55 to 60%. The  left ventricle has normal function. The left ventricle has no regional  wall motion abnormalities. There is mild left ventricular hypertrophy.  Left ventricular diastolic parameters  are consistent with Grade I diastolic dysfunction (impaired relaxation).   2. Right ventricular systolic function is normal. The right ventricular  size is normal. Tricuspid regurgitation signal is inadequate for assessing  PA pressure.   3. Left atrial size was mildly dilated.   4. The mitral valve is normal in structure. No evidence of mitral valve  regurgitation. No evidence of mitral stenosis.   5. The aortic valve is normal in structure. Aortic valve regurgitation is  trivial. Aortic valve  sclerosis/calcification is present, without any  evidence of aortic stenosis.   6. Aortic dilatation noted. There is moderate dilatation of the ascending  aorta, measuring 44 mm.   ASSESSMENT  Edward Thompson is a 73 y.o. male presenting with an acute left basal ganglia/corona radiata ischemic infarct. He has multiple stroke risk factors, as documented in the HPI above.  - Exam reveals dysarthria, bilateral ptosis (chronic), right facial droop, bilateral foot drop, decreased temp sensation to RUE and RLE, as well as reflexes that are hypoactive (trace to 1+) throughout - CT head: Probable subacute infarct involving the left thalamus, basal ganglia, and deep periventricular white matter. Cerebral atrophy and chronic small vessel disease. - CTA of head and neck:  Chronically occluded right P1. Severe left paraclinoid ICA stenosis. Severe bilateral intradural vertebral artery stenosis. Moderate basilar artery stenosis.  - MRI brain: 2.2 x 1.0 cm acute infarct within the left basal ganglia/corona radiata. Moderate background cerebral white matter chronic small vessel ischemic disease. Generalized cerebral atrophy. - TTE as above. No mural thrombus or valvular vegetation is mentioned in the report.  - EKG: Sinus rhythm; RBBB and LAFB; Probable lateral infarct, old. - Impression: Acute left basal ganglia/corona radiata ischemic infarct. DDx for underlying etiology includes chronic small vessel disease, cardioembolic and atherosclerosis with thrombus formation and distal embolization.   RECOMMENDATIONS  - Continue his home doses of phenytoin  and phenobarbital  - Glycemic control -  Continue home dosing regimen of ASA, Plavix  and rosuvastatin  - HgbA1c, fasting lipid panel - PT consult, OT consult, Speech consult - BP management per standard protocol. Out of the permissive HTN time window.  - Risk factor modification - Telemetry monitoring - Frequent neuro checks - NPO until passes stroke swallow  screen  ______________________________________________________________________    Bonney SHARK, Cristalle Rohm, MD [facility]

## 2024-01-02 NOTE — Plan of Care (Signed)
 Problem: Education: Goal: Knowledge of disease or condition will improve 01/02/2024 0558 by Vicci Verla CROME, RN Outcome: Progressing 01/02/2024 0500 by Vicci Verla CROME, RN Outcome: Progressing Goal: Knowledge of secondary prevention will improve (MUST DOCUMENT ALL) 01/02/2024 0558 by Vicci Verla CROME, RN Outcome: Progressing 01/02/2024 0500 by Vicci Verla CROME, RN Outcome: Progressing Goal: Knowledge of patient specific risk factors will improve (DELETE if not current risk factor) 01/02/2024 0558 by Vicci Verla CROME, RN Outcome: Progressing 01/02/2024 0500 by Vicci Verla CROME, RN Outcome: Progressing   Problem: Ischemic Stroke/TIA Tissue Perfusion: Goal: Complications of ischemic stroke/TIA will be minimized 01/02/2024 0558 by Vicci Verla CROME, RN Outcome: Progressing 01/02/2024 0500 by Vicci Verla CROME, RN Outcome: Progressing   Problem: Coping: Goal: Will verbalize positive feelings about self 01/02/2024 0558 by Vicci Verla CROME, RN Outcome: Progressing 01/02/2024 0500 by Vicci Verla CROME, RN Outcome: Progressing Goal: Will identify appropriate support needs 01/02/2024 0558 by Vicci Verla CROME, RN Outcome: Progressing 01/02/2024 0500 by Vicci Verla CROME, RN Outcome: Progressing   Problem: Health Behavior/Discharge Planning: Goal: Ability to manage health-related needs will improve 01/02/2024 0558 by Vicci Verla CROME, RN Outcome: Progressing 01/02/2024 0500 by Vicci Verla CROME, RN Outcome: Progressing Goal: Goals will be collaboratively established with patient/family 01/02/2024 0558 by Vicci Verla CROME, RN Outcome: Progressing 01/02/2024 0500 by Vicci Verla CROME, RN Outcome: Progressing   Problem: Self-Care: Goal: Ability to participate in self-care as condition permits will improve 01/02/2024 0558 by Vicci Verla CROME, RN Outcome: Progressing 01/02/2024 0500 by Vicci Verla CROME, RN Outcome: Progressing Goal: Verbalization of feelings and  concerns over difficulty with self-care will improve 01/02/2024 0558 by Vicci Verla CROME, RN Outcome: Progressing 01/02/2024 0500 by Vicci Verla CROME, RN Outcome: Progressing Goal: Ability to communicate needs accurately will improve 01/02/2024 0558 by Vicci Verla CROME, RN Outcome: Progressing 01/02/2024 0500 by Vicci Verla CROME, RN Outcome: Progressing   Problem: Nutrition: Goal: Risk of aspiration will decrease 01/02/2024 0558 by Vicci Verla CROME, RN Outcome: Progressing 01/02/2024 0500 by Vicci Verla CROME, RN Outcome: Progressing Goal: Dietary intake will improve 01/02/2024 0558 by Vicci Verla CROME, RN Outcome: Progressing 01/02/2024 0500 by Vicci Verla CROME, RN Outcome: Progressing   Problem: Education: Goal: Ability to describe self-care measures that may prevent or decrease complications (Diabetes Survival Skills Education) will improve 01/02/2024 0558 by Vicci Verla CROME, RN Outcome: Progressing 01/02/2024 0500 by Vicci Verla CROME, RN Outcome: Progressing Goal: Individualized Educational Video(s) 01/02/2024 0558 by Vicci Verla CROME, RN Outcome: Progressing 01/02/2024 0500 by Vicci Verla CROME, RN Outcome: Progressing   Problem: Coping: Goal: Ability to adjust to condition or change in health will improve 01/02/2024 0558 by Vicci Verla CROME, RN Outcome: Progressing 01/02/2024 0500 by Vicci Verla CROME, RN Outcome: Progressing   Problem: Fluid Volume: Goal: Ability to maintain a balanced intake and output will improve 01/02/2024 0558 by Vicci Verla CROME, RN Outcome: Progressing 01/02/2024 0500 by Vicci Verla CROME, RN Outcome: Progressing   Problem: Health Behavior/Discharge Planning: Goal: Ability to identify and utilize available resources and services will improve 01/02/2024 0558 by Vicci Verla CROME, RN Outcome: Progressing 01/02/2024 0500 by Vicci Verla CROME, RN Outcome: Progressing Goal: Ability to manage health-related needs will  improve 01/02/2024 0558 by Vicci Verla CROME, RN Outcome: Progressing 01/02/2024 0500 by Vicci Verla CROME, RN Outcome: Progressing   Problem: Metabolic: Goal: Ability to maintain appropriate glucose levels will improve 01/02/2024 0558 by Vicci Verla CROME, RN Outcome: Progressing 01/02/2024 0500 by Vicci Verla CROME, RN Outcome: Progressing  Problem: Nutritional: Goal: Maintenance of adequate nutrition will improve 01/02/2024 0558 by Vicci Verla CROME, RN Outcome: Progressing 01/02/2024 0500 by Vicci Verla CROME, RN Outcome: Progressing Goal: Progress toward achieving an optimal weight will improve 01/02/2024 0558 by Vicci Verla CROME, RN Outcome: Progressing 01/02/2024 0500 by Vicci Verla CROME, RN Outcome: Progressing   Problem: Skin Integrity: Goal: Risk for impaired skin integrity will decrease 01/02/2024 0558 by Vicci Verla CROME, RN Outcome: Progressing 01/02/2024 0500 by Vicci Verla CROME, RN Outcome: Progressing   Problem: Tissue Perfusion: Goal: Adequacy of tissue perfusion will improve 01/02/2024 0558 by Vicci Verla CROME, RN Outcome: Progressing 01/02/2024 0500 by Vicci Verla CROME, RN Outcome: Progressing   Problem: Education: Goal: Knowledge of General Education information will improve Description: Including pain rating scale, medication(s)/side effects and non-pharmacologic comfort measures 01/02/2024 0558 by Vicci Verla CROME, RN Outcome: Progressing 01/02/2024 0500 by Vicci Verla CROME, RN Outcome: Progressing   Problem: Health Behavior/Discharge Planning: Goal: Ability to manage health-related needs will improve 01/02/2024 0558 by Vicci Verla CROME, RN Outcome: Progressing 01/02/2024 0500 by Vicci Verla CROME, RN Outcome: Progressing   Problem: Clinical Measurements: Goal: Ability to maintain clinical measurements within normal limits will improve 01/02/2024 0558 by Vicci Verla CROME, RN Outcome: Progressing 01/02/2024 0500 by Vicci Verla CROME, RN Outcome: Progressing Goal: Will remain free from infection 01/02/2024 0558 by Vicci Verla CROME, RN Outcome: Progressing 01/02/2024 0500 by Vicci Verla CROME, RN Outcome: Progressing Goal: Diagnostic test results will improve 01/02/2024 0558 by Vicci Verla CROME, RN Outcome: Progressing 01/02/2024 0500 by Vicci Verla CROME, RN Outcome: Progressing Goal: Respiratory complications will improve 01/02/2024 0558 by Vicci Verla CROME, RN Outcome: Progressing 01/02/2024 0500 by Vicci Verla CROME, RN Outcome: Progressing Goal: Cardiovascular complication will be avoided 01/02/2024 0558 by Vicci Verla CROME, RN Outcome: Progressing 01/02/2024 0500 by Vicci Verla CROME, RN Outcome: Progressing   Problem: Activity: Goal: Risk for activity intolerance will decrease 01/02/2024 0558 by Vicci Verla CROME, RN Outcome: Progressing 01/02/2024 0500 by Vicci Verla CROME, RN Outcome: Progressing   Problem: Nutrition: Goal: Adequate nutrition will be maintained 01/02/2024 0558 by Vicci Verla CROME, RN Outcome: Progressing 01/02/2024 0500 by Vicci Verla CROME, RN Outcome: Progressing   Problem: Coping: Goal: Level of anxiety will decrease 01/02/2024 0558 by Vicci Verla CROME, RN Outcome: Progressing 01/02/2024 0500 by Vicci Verla CROME, RN Outcome: Progressing   Problem: Elimination: Goal: Will not experience complications related to bowel motility 01/02/2024 0558 by Vicci Verla CROME, RN Outcome: Progressing 01/02/2024 0500 by Vicci Verla CROME, RN Outcome: Progressing Goal: Will not experience complications related to urinary retention 01/02/2024 0558 by Vicci Verla CROME, RN Outcome: Progressing 01/02/2024 0500 by Vicci Verla CROME, RN Outcome: Progressing   Problem: Pain Managment: Goal: General experience of comfort will improve and/or be controlled 01/02/2024 0558 by Vicci Verla CROME, RN Outcome: Progressing 01/02/2024 0500 by Vicci Verla CROME, RN Outcome:  Progressing   Problem: Safety: Goal: Ability to remain free from injury will improve 01/02/2024 0558 by Vicci Verla CROME, RN Outcome: Progressing 01/02/2024 0500 by Vicci Verla CROME, RN Outcome: Progressing   Problem: Skin Integrity: Goal: Risk for impaired skin integrity will decrease 01/02/2024 0558 by Vicci Verla CROME, RN Outcome: Progressing 01/02/2024 0500 by Vicci Verla CROME, RN Outcome: Progressing
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
## 2024-01-03 ENCOUNTER — Other Ambulatory Visit: Payer: Self-pay

## 2024-01-03 DIAGNOSIS — I639 Cerebral infarction, unspecified: Secondary | ICD-10-CM | POA: Diagnosis not present

## 2024-01-03 DIAGNOSIS — N3090 Cystitis, unspecified without hematuria: Secondary | ICD-10-CM | POA: Diagnosis not present

## 2024-01-03 LAB — GLUCOSE, CAPILLARY
Glucose-Capillary: 98 mg/dL (ref 70–99)
Glucose-Capillary: 134 mg/dL — ABNORMAL HIGH (ref 70–99)
Glucose-Capillary: 142 mg/dL — ABNORMAL HIGH (ref 70–99)
Glucose-Capillary: 151 mg/dL — ABNORMAL HIGH (ref 70–99)

## 2024-01-03 MED ORDER — FUROSEMIDE 20 MG PO TABS
20.0000 mg | ORAL_TABLET | Freq: Every day | ORAL | Status: DC
  Administered 2024-01-03 – 2024-01-04 (×2): 20 mg via ORAL
  Filled 2024-01-03 (×2): qty 1

## 2024-01-03 MED ORDER — ISOSORBIDE MONONITRATE ER 30 MG PO TB24
30.0000 mg | ORAL_TABLET | Freq: Every day | ORAL | Status: DC
Start: 2024-01-03 — End: 2024-01-04
  Administered 2024-01-03 – 2024-01-04 (×2): 30 mg via ORAL
  Filled 2024-01-03 (×2): qty 1

## 2024-01-03 MED ORDER — METOPROLOL SUCCINATE ER 25 MG PO TB24
12.5000 mg | ORAL_TABLET | Freq: Every day | ORAL | Status: DC
  Administered 2024-01-03 – 2024-01-04 (×2): 12.5 mg via ORAL
  Filled 2024-01-03 (×2): qty 1

## 2024-01-03 MED ORDER — ORAL CARE MOUTH RINSE: 15.0000 mL | OROMUCOSAL | Status: DC | PRN

## 2024-01-03 NOTE — Progress Notes (Addendum)
 Progress Note   Patient: Edward Thompson FMW:[id number] DOB: 31-Jul-1950 DOA: 01/01/2024     2 DOS: the patient was seen and examined on 01/03/2024   Brief hospital course:  HPI on admission: Edward Thompson is a 73 y.o. 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 ... See H&P for full HPI on admission & ED course.  Patient was found to have an acute CVA of the left basal ganglia / corona radiata on MRI brain.  UA was also consisted with infection.  Patient admitted for further evaluation and management of stroke and UTI.  Further hospital course and management as outlined below.   Consults: Neurology   Assessment and Plan:  Acute right-sided paresis Acute dysarthria Left-sided basal ganglia and corona radiata stroke, likely thrombotic MRI brain was motion degraded but showed 2.2 x 1.0 cm acute infarct within the left basal ganglia / corona Radiata, generalized atrophy, moderate chronic small vessel ischemic disease. CTA head/neck showed significant vascular disease:  1. Chronically occluded right P1. 2. Severe left paraclinoid ICA stenosis. 3. Severe bilateral intradural vertebral artery stenosis. 4. Moderate basilar artery stenosis ... Pt denies known hx of A-fib/flutter or having heart palpitations. --Neurology consulted --Follow pending Echo --Continue aspirin  Plavix  --Telemetry --Permissive hypertension for now --Resumed on diet per SLP --SLP recommends continued SLP post-d/c --PT/OT recommend [facility] PT/OT --Neuro checks --Optimize glycemic control, risk factor modification   Acute metabolic  encephalopathy Due to UTI - Continue ceftriaxone  & follow urine culture (+GNR's) - Other DDx, Dilantin  level within normal limits and the family did not observe any seizure activity, and family reported patient has been seizure-free for at least several years, currently there is low suspicion for breakthrough seizure.  Hx of Hypertension - BP meds initially held for permissive HTN. --Resume home Lasix , Imdur , metoprolol  --Monitor BP & titrate regimen  Hx of CAD s/p stent - stable no chest pain --Continue ASA, Plavix , statin, beta blocker  Hx of Seizure disorder --Continue home phenytoin , phenobarbital    Chronic neurogenic bladder Chronic urinary retention on self-catheterization - For now, bladder scan every shift and straight cath for volumes more than 250 mL.   Peripheral edema - Chronic as per family. Pt reports this is stable at baseline. - Resume Lasix     Insulin -dependent type 2 diabetes - Hold off metformin  - Sliding scale Novolog  - Titrate regimen   Chronic ambulation impairment wheelchair-bound progressive genetic neuromuscular disease --PT/OT -- recommending HH --Fall precautions      Subjective: Pt seen up in recliner this AM. He reports feeling fair.  Denies feeling much improvement in right side weakness.  Speech slurring is improved since yesterday, better able to understand him.  No other acute complaints.    Physical Exam: Vitals:   01/03/24 0432 01/03/24 0708 01/03/24 1043 01/03/24 1550  BP: (!) 155/80 135/69 119/77 131/64  Pulse: 88 84 92 (!) 102  Resp: 18 20 (!) 22 20  Temp: 98.4 F (36.9 C) 98.3 F (36.8 C) 98.4 F (36.9 C) 98.2 F (36.8 C)  TempSrc: Oral     SpO2: 92% 95% 98% 95%  Weight:      Height:       General exam: awake, alert, no acute  distress, chronically ill appearing HEENT: poor dentition, moist mucus membranes, hearing grossly normal  Respiratory system: on room air, normal respiratory effort. Cardiovascular system: normal  S1/S2, RRR, +bilateral  pedal edema stable Gastrointestinal system: soft, NT, ND, no HSM felt, +bowel sounds. Central nervous system: A&O x3. Right-sided weakness, slurred speech is somewhat improved today, right facial droop Skin: dry, intact, normal temperature Psychiatry: normal mood, flat affect, judgement and insight appear normal   Data Reviewed:  Notable labs --   Normal lipid panel  Micro -- urine culture -- +gram neg rods -- PENDING  Echo - EF 55-60%, grade 1 DD, no interatrial shunt detected  Imaging -- MRI brain and CTA head/neck as reviewed above in A&P  Family Communication: Pt declines for me to call anyone when asked.  Disposition: Status is: Inpatient Remains inpatient appropriate because: ongoing evaluation, remains on IV antibiotics pending urine culture   Planned Discharge Destination: Home with Home Health    Time spent: 40 minutes  Author: Burnard DELENA Cunning, DO 01/03/2024 4:24 PM  For on call review [email or web address].

## 2024-01-03 NOTE — Plan of Care (Signed)

## 2024-01-03 NOTE — Progress Notes (Signed)
 Physical Therapy Treatment Patient Details Name: Edward Thompson MRN: [id number] DOB: 05/18/1950 Today's Date: 01/03/2024   History of Present Illness Edward Thompson is a 73 y.o. 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 01/01/24: acute infarct within the left basal ganglia/corona  radiata.    PT Comments  Pt received upright in recliner agreeable to PT. Pt making good progress in POC with STS and Stand pivot from recliner to manual w/c at CGA. Pt able to self propel manual w/c ~180' using LE's reporting this is his baseline with w/c mobility. Pt's cadence does diminish the last 1/4 of the bout with increased WOB. HR and SPO2 are WNL. Pt just endorses fatigue especially with RLE. Pt returns to room returning to recliner via stand pivot at supervision. Pt with safe use of w/c brakes prior to t/f then completing x3 STS to RW working on RLE strength. Pt visibly fatigued. Pt left in recliner with all needs in reach. D/c recs remain appropriate as pt is progressing well towards PLOF.    If plan is discharge home, recommend the following: A little help with walking and/or transfers;A little help with bathing/dressing/bathroom;Assistance with cooking/housework;Assist for transportation;Help with stairs or ramp for entrance   Can travel by private vehicle        Equipment Recommendations  None recommended by PT    Recommendations for Other Services       Precautions / Restrictions Precautions Precautions: Fall Recall of Precautions/Restrictions: Intact Restrictions Weight Bearing Restrictions Per Provider Order: No      Mobility  Bed Mobility               General bed mobility comments: not tested Patient Response: Cooperative, Flat affect  Transfers Overall transfer level: Needs assistance   Transfers: Sit to/from Stand, Bed to chair/wheelchair/BSC Sit to Stand: Contact guard assist Stand pivot transfers: Contact guard assist         General transfer comment: utilization of UE's on arm rests then pivots to sitting in recliner    Ambulation/Gait Ambulation/Gait assistance: Supervision Gait Distance (Feet): 180 Feet Assistive device: Pushed wheelchair         General Gait Details: self propelled manual w/c using LE's   Stairs             Wheelchair Mobility     Tilt Bed Tilt Bed Patient Response: Cooperative, Flat affect  Modified Rankin (Stroke Patients Only)       Balance Overall balance assessment: Needs assistance Sitting-balance support: No upper extremity supported, Feet supported Sitting balance-Leahy Scale: Fair     Standing balance support: Bilateral upper extremity supported Standing balance-Leahy Scale: Fair Standing balance comment: can stand without UE support at bedside statically                            Communication Communication Communication: Impaired Factors Affecting Communication: Reduced clarity of speech  Cognition Arousal: Alert Behavior During Therapy: WFL for tasks assessed/performed   PT - Cognitive impairments: No apparent impairments                         Following commands: Intact  Cueing Cueing Techniques: Verbal cues  Exercises Other Exercises Other Exercises: x3 STS with RLE under BOS and LLE slightly anterior to work on RLE strengthening.    General Comments        Pertinent Vitals/Pain Pain Assessment Pain Assessment: No/denies pain    Home Living                          Prior Function            PT Goals (current goals can now be found in the care plan  section) Acute Rehab PT Goals Patient Stated Goal: to improve R sided strength PT Goal Formulation: With patient Time For Goal Achievement: 01/16/24 Potential to Achieve Goals: Good Progress towards PT goals: Progressing toward goals    Frequency    Min 4X/week      PT Plan      Co-evaluation              AM-PAC PT 6 Clicks Mobility   Outcome Measure  Help needed turning from your back to your side while in a flat bed without using bedrails?: A Little Help needed moving from lying on your back to sitting on the side of a flat bed without using bedrails?: A Little Help needed moving to and from a bed to a chair (including a wheelchair)?: A Little Help needed standing up from a chair using your arms (e.g., wheelchair or bedside chair)?: A Little Help needed to walk in hospital room?: A Lot Help needed climbing 3-5 steps with a railing? : Total 6 Click Score: 15    End of Session Equipment Utilized During Treatment: Gait belt Activity Tolerance: Patient tolerated treatment well Patient left: in chair;with call bell/phone within reach;with chair alarm set Nurse Communication: Mobility status PT Visit Diagnosis: Muscle weakness (generalized) (M62.81);Other abnormalities of gait and mobility (R26.89);History of falling (Z91.81)     Time: 1016-1040 PT Time Calculation (min) (ACUTE ONLY): 24 min  Charges:    $Gait Training: 8-22 mins $Therapeutic Activity: 8-22 mins PT General Charges $$ ACUTE PT VISIT: 1 Visit                     Dorina HERO. Fairly IV, PT, DPT Physical Therapist- [place]  [facility] 01/03/2024, 12:25 PM

## 2024-01-03 NOTE — Progress Notes (Signed)
 Speech Language Pathology Treatment: Dysphagia  Patient Details Name: Edward Thompson MRN: [id number] DOB: 08-08-1950 Today's Date: 01/03/2024 Time: 8859-8779 SLP Time Calculation (min) (ACUTE ONLY): 40 min  Assessment / Plan / Recommendation Clinical Impression  Pt seen for ongoing assessment of swallowing and toleration of diet this session. Pt is on a Dysphagia level 2 (MINCED foods) diet w/ thin liquids. NSG reported good toleration of the diet consistency w/ no clinical s/s of aspiration noted at breakfast meal; pt feeds self w/ setup per report.  Noted pt continues to exhibit MOD decreased R labial-facial tone at rest; Dysarthria+. Pt has mild cognitive impairment  at Baseline per chart.  Pt on RA, afebrile. WBC WNL.   Pt appears to present w/ Oral phase dysphagia w/ functional pharyngeal phase swallowing; sensorimotor deficits noted impacting R side during bolus awareness, management, and clearing. Pt consumed po trials w/ No overt, clinical s/s of aspiration during po trials.  Pt appears at reduced risk for aspiration when following general aspiration precautions, using a Dysphagia consistency diet(MINCED Foods), and w/ Supervision at meals to aid follow-through w/ swallowing strategies/precautions. Pt does have challenging factors that could impact oropharyngeal swallowing to include new acute infarct of L basal ganglia/corona radiata, deconditioning/weakness, progressive genetic neuromuscular disease, and Mild cognitive impairment. These factors can increase risk for dysphagia as well as decreased oral intake overall.   During po trials, pt consumed all trials w/ no overt coughing, decline in vocal quality, or change in respiratory presentation during/post trials. O2 sats remained in upper 90s during. Oral phase appeared grossly [facility] w/ bolus management and control of bolus propulsion for A-P transfer for swallowing w/ thin liquids via straw. However, w/ increased textured (food) trials, pt  exhibited increased oral phase time and bolus management effort, decreased sensation/awareness of bolus residue resulting in R pocketing/buccal residue and anterior spillage inconsistently. Pt was CUED TO USE LINGUAL and/or FINGER SWEEPING in R buccal area to clear food residue intermittently b/t bites- this was more noticeable w/ increased textured food trials/Minced foods. Oral clearing was more Timely w/ purees and liquids but  achieved overall w/ all trial consistencies given Time and moistening foods. Also educated and Cued pt on using the strategy of ALTERNATING foods/liquids to aid oral clearing.  Pt fed self w/ setup support.   Recommend continue a MINCED foods consistency diet(Dysphagia level 2) w/ well-moistened foods; Thin liquids -- monitor straw use, and pt should Hold Cup when drinking. Recommend general aspiration precautions including Small bites/sips Slowly w/ R oral clearing b/t bites using LINGUAL and/or FINGER SWEEPING. Alternate foods/liquids during meals. FULL tray setup at meals. Sitting up in chair Best for eating meals. Pills WHOLE vs Crushed in Puree for safer, easier swallowing -- pt has been doing this successfully w/ NSG, and it was encouraged for D/C also.  Education given on the swallowing/oral clearing strategies; Pills in Puree; food consistencies and easy to eat options; general aspiration precautions to pt. ST services to continue to monitor pt's status and toleration of diet while admitted for any needs; Dysarthria tx to follow at D/C. NSG to reconsult if any new needs arise. NSG updated, agreed. MD updated. Recommend Dietician f/u for support. Palliative Care f/u for GOC; education and support.         HPI HPI: Edward Thompson is a 73 y.o. male with medical history significant of CAD status post stenting on aspirin  and Plavix , seizure disorder, HTN, IIDM, progressive genetic neuromuscular disease with chronic bilateral lower extremity weakness  wheelchair-bound,  neurogenic bladder on self cath 2 times a day, mild cognitive impairment, brought in by family member for evaluation of acute right-sided weakness right-sided facial droop and slurred speech. MRI: Significantly motion degraded examination. Within this  limitation, findings are as follows.  2. 2.2 x 1.0 cm acute infarct within the left basal ganglia/corona  radiata.  3. Moderate background cerebral white matter chronic small vessel  ischemic disease.  4. Generalized cerebral atrophy.  5. Minor right maxillary sinus mucosal thickening.      SLP Plan  Continue with current plan of care          Recommendations  Diet recommendations: Dysphagia 2 (fine chop);Thin liquid (gravies to moisten) Liquids provided via: Cup;Straw (monitor) Medication Administration: Whole meds with puree (vs need to CRUSH in Puree) Supervision: Patient able to self feed;Intermittent supervision to cue for compensatory strategies (setup) Compensations: Minimize environmental distractions;Small sips/bites;Slow rate;Lingual sweep for clearance of pocketing;Monitor for anterior loss;Follow solids with liquid Postural Changes and/or Swallow Maneuvers: Out of bed for meals;Seated upright 90 degrees;Upright 30-60 min after meal                 (Dietician f/u) Oral care BID;Oral care before and after PO;Staff/trained caregiver to provide oral care (support pt)   Intermittent Supervision/Assistance (for follow through w/ strategies/precautions) Dysphagia, oral phase (R13.11)     Continue with current plan of care      Comer Portugal, MS, CCC-SLP Speech Language Pathologist Rehab Services; [facility] [phone] (ascom) Durga Saldarriaga  01/03/2024, 4:27 PM

## 2024-01-03 NOTE — Progress Notes (Signed)
 Occupational Therapy Treatment Patient Details Name: Edward Thompson MRN: [id number] DOB: Mar 17, 1951 Today's Date: 01/03/2024   History of present illness BRONCO MCGRORY is a 73 y.o. 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 01/01/24: acute infarct within the left basal ganglia/corona  radiata.   OT comments  Upon entering the room, pt seated in recliner chair and agreeable to OT intervention. Pt having difficulty maintain oral secretions in mouth and needing assistance to change gown. Min A to change gown while seated in recliner chair. Pt needing set up A to utilize R UE to wash face, chest, and hands. Pt performs AROM exercises with R UE against gravity in all planes of movement. Pt then performing chair push ups with weight bearing through B UEs x 5 reps each. Pt returning to sit in recliner chair with call bell and all needed items within reach.      If plan is discharge home, recommend the following:  A little help with bathing/dressing/bathroom;Help with stairs or ramp for entrance   Equipment Recommendations  BSC/3in1       Precautions / Restrictions Precautions Precautions: Fall Recall of Precautions/Restrictions: Intact       Mobility Bed Mobility               General bed mobility comments: seated in recliner chair    Transfers Overall transfer level: Needs assistance Equipment used: Rolling walker (2 wheels), None Transfers: Sit to/from Stand Sit to Stand: Contact guard assist, Min assist                 Balance   Sitting-balance support: No upper extremity supported, Feet  supported Sitting balance-Leahy Scale: Fair     Standing balance support: Bilateral upper extremity supported Standing balance-Leahy Scale: Fair                             ADL either performed or assessed with clinical judgement   ADL Overall ADL's : Needs assistance/impaired     Grooming: Wash/dry hands;Wash/dry face;Set up;Sitting           Upper Body Dressing : Minimal assistance;Sitting Upper Body Dressing Details (indicate cue type and reason): change hospital gown                         Vision Patient Visual Report: No change from baseline           Communication Communication Communication: Impaired Factors Affecting Communication: Reduced clarity of speech   Cognition Arousal: Alert Behavior During Therapy: WFL for tasks assessed/performed Cognition: No apparent impairments                               Following commands: Intact        Cueing   Cueing Techniques: Verbal cues             Pertinent Vitals/ Pain       Pain Assessment Pain Assessment: No/denies pain         Frequency  Min 3X/week        Progress Toward Goals  OT Goals(current goals can now be found in the care plan section)  Progress towards OT goals: Progressing toward goals      AM-PAC OT 6 Clicks Daily Activity     Outcome Measure   Help from another person eating meals?: None Help from another person taking care of personal grooming?: A Little Help from another person toileting, which includes using toliet, bedpan, or urinal?: A Little Help from another person bathing (including washing, rinsing, drying)?: A Little Help from another person to put on and taking off regular upper body clothing?: None Help from another person to put on and taking off regular lower body clothing?: A Little 6 Click Score: 20    End of Session    OT Visit Diagnosis: Other abnormalities of gait and mobility (R26.89);Muscle weakness  (generalized) (M62.81)   Activity Tolerance Patient tolerated treatment well   Patient Left in chair;with call bell/phone within reach;with chair alarm set   Nurse Communication          Time: 8872-8849 OT Time Calculation (min): 23 min  Charges: OT General Charges $OT Visit: 1 Visit OT Treatments $Self Care/Home Management : 8-22 mins $Therapeutic Exercise: 8-22 mins  Izetta Claude, MS, OTR/L , CBIS ascom [phone]  01/03/24, 2:52 PM

## 2024-01-03 NOTE — Care Management Important Message (Signed)
 Important Message  Patient Details  Name: Edward Thompson MRN: [id number] Date of Birth: Dec 23, 1950   Important Message Given:  Yes - Medicare IM     [name] 01/03/2024, 1:41 PM

## 2024-01-04 DIAGNOSIS — I639 Cerebral infarction, unspecified: Secondary | ICD-10-CM | POA: Diagnosis not present

## 2024-01-04 LAB — CBC
HCT: 34.6 % — ABNORMAL LOW (ref 39.0–52.0)
Hemoglobin: 11.4 g/dL — ABNORMAL LOW (ref 13.0–17.0)
MCH: 29 pg (ref 26.0–34.0)
MCHC: 32.9 g/dL (ref 30.0–36.0)
MCV: 88 fL (ref 80.0–100.0)
Platelets: 181 K/uL (ref 150–400)
RBC: 3.93 MIL/uL — ABNORMAL LOW (ref 4.22–5.81)
RDW: 14.7 % (ref 11.5–15.5)
WBC: 8.3 K/uL (ref 4.0–10.5)
nRBC: 0 % (ref 0.0–0.2)

## 2024-01-04 LAB — GLUCOSE, CAPILLARY
Glucose-Capillary: 110 mg/dL — ABNORMAL HIGH (ref 70–99)
Glucose-Capillary: 127 mg/dL — ABNORMAL HIGH (ref 70–99)

## 2024-01-04 LAB — URINE CULTURE: Culture: >=100000 — AB

## 2024-01-04 LAB — BASIC METABOLIC PANEL WITH GFR
Anion gap: 9 (ref 5–15)
BUN: 15 mg/dL (ref 8–23)
CO2: 24 mmol/L (ref 22–32)
Calcium: 8.6 mg/dL — ABNORMAL LOW (ref 8.9–10.3)
Chloride: 104 mmol/L (ref 98–111)
Creatinine, Ser: 0.65 mg/dL (ref 0.61–1.24)
GFR, Estimated: >60 mL/min (ref >60)
Glucose, Bld: 110 mg/dL — ABNORMAL HIGH (ref 70–99)
Potassium: 3.8 mmol/L (ref 3.5–5.1)
Sodium: 137 mmol/L (ref 135–145)

## 2024-01-04 MED ORDER — CEPHALEXIN 500 MG PO CAPS: 500.0000 mg | ORAL_CAPSULE | Freq: Three times a day (TID) | ORAL | 0 refills | Status: AC

## 2024-01-04 MED ORDER — PANTOPRAZOLE SODIUM 40 MG PO TBEC
40.0000 mg | DELAYED_RELEASE_TABLET | Freq: Every day | ORAL | Status: DC
  Administered 2024-01-04: 40 mg via ORAL
  Filled 2024-01-04: qty 1

## 2024-01-04 NOTE — Plan of Care (Signed)

## 2024-01-04 NOTE — Plan of Care (Signed)
 Problem: Education: Goal: Knowledge of disease or condition will improve 01/04/2024 0148 by Mick Donzell CROME, RN Outcome: Progressing 01/04/2024 0148 by Mick Donzell CROME, RN Outcome: Progressing Goal: Knowledge of secondary prevention will improve (MUST DOCUMENT ALL) Outcome: Progressing Goal: Knowledge of patient specific risk factors will improve (DELETE if not current risk factor) Outcome: Progressing   Problem: Ischemic Stroke/TIA Tissue Perfusion: Goal: Complications of ischemic stroke/TIA will be minimized 01/04/2024 0148 by Mick Donzell CROME, RN Outcome: Progressing 01/04/2024 0148 by Mick Donzell CROME, RN Outcome: Progressing   Problem: Coping: Goal: Will verbalize positive feelings about self 01/04/2024 0148 by Mick Donzell CROME, RN Outcome: Progressing 01/04/2024 0148 by Mick Donzell CROME, RN Outcome: Progressing Goal: Will identify appropriate support needs Outcome: Progressing   Problem: Health Behavior/Discharge Planning: Goal: Ability to manage health-related needs will improve 01/04/2024 0148 by Mick Donzell CROME, RN Outcome: Progressing 01/04/2024 0148 by Mick Donzell CROME, RN Outcome: Progressing Goal: Goals will be collaboratively established with patient/family Outcome: Progressing   Problem: Self-Care: Goal: Ability to participate in self-care as condition permits will improve 01/04/2024 0148 by Mick Donzell CROME, RN Outcome: Progressing 01/04/2024 0148 by Mick Donzell CROME, RN Outcome: Progressing Goal: Verbalization of feelings and concerns over difficulty with self-care will improve Outcome: Progressing Goal: Ability to communicate needs accurately will improve Outcome: Progressing   Problem: Nutrition: Goal: Risk of aspiration will decrease Outcome: Progressing Goal: Dietary intake will improve Outcome: Progressing   Problem: Education: Goal: Ability to describe self-care measures that may prevent or decrease complications (Diabetes Survival Skills Education) will  improve 01/04/2024 0148 by Mick Donzell CROME, RN Outcome: Progressing 01/04/2024 0148 by Mick Donzell CROME, RN Outcome: Progressing Goal: Individualized Educational Video(s) Outcome: Progressing   Problem: Coping: Goal: Ability to adjust to condition or change in health will improve 01/04/2024 0148 by Mick Donzell CROME, RN Outcome: Progressing 01/04/2024 0148 by Mick Donzell CROME, RN Outcome: Progressing   Problem: Fluid Volume: Goal: Ability to maintain a balanced intake and output will improve 01/04/2024 0148 by Mick Donzell CROME, RN Outcome: Progressing 01/04/2024 0148 by Mick Donzell CROME, RN Outcome: Progressing   Problem: Health Behavior/Discharge Planning: Goal: Ability to identify and utilize available resources and services will improve Outcome: Progressing Goal: Ability to manage health-related needs will improve Outcome: Progressing   Problem: Metabolic: Goal: Ability to maintain appropriate glucose levels will improve Outcome: Progressing   Problem: Nutritional: Goal: Maintenance of adequate nutrition will improve 01/04/2024 0148 by Mick Donzell CROME, RN Outcome: Progressing 01/04/2024 0148 by Mick Donzell CROME, RN Outcome: Progressing Goal: Progress toward achieving an optimal weight will improve Outcome: Progressing   Problem: Skin Integrity: Goal: Risk for impaired skin integrity will decrease 01/04/2024 0148 by Mick Donzell CROME, RN Outcome: Progressing 01/04/2024 0148 by Mick Donzell CROME, RN Outcome: Progressing   Problem: Tissue Perfusion: Goal: Adequacy of tissue perfusion will improve Outcome: Progressing   Problem: Education: Goal: Knowledge of General Education information will improve Description: Including pain rating scale, medication(s)/side effects and non-pharmacologic comfort measures Outcome: Progressing   Problem: Health Behavior/Discharge Planning: Goal: Ability to manage health-related needs will improve Outcome: Progressing   Problem: Clinical  Measurements: Goal: Ability to maintain clinical measurements within normal limits will improve Outcome: Progressing Goal: Will remain free from infection Outcome: Progressing Goal: Diagnostic test results will improve Outcome: Progressing Goal: Respiratory complications will improve Outcome: Progressing Goal: Cardiovascular complication will be avoided Outcome: Progressing   Problem: Activity: Goal: Risk for activity intolerance will decrease Outcome: Progressing   Problem: Nutrition: Goal: Adequate  nutrition will be maintained Outcome: Progressing   Problem: Coping: Goal: Level of anxiety will decrease Outcome: Progressing   Problem: Elimination: Goal: Will not experience complications related to bowel motility Outcome: Progressing Goal: Will not experience complications related to urinary retention Outcome: Progressing   Problem: Pain Managment: Goal: General experience of comfort will improve and/or be controlled Outcome: Progressing   Problem: Safety: Goal: Ability to remain free from injury will improve Outcome: Progressing   Problem: Skin Integrity: Goal: Risk for impaired skin integrity will decrease Outcome: Progressing

## 2024-01-04 NOTE — Progress Notes (Signed)
 Speech Language Pathology Treatment: Dysphagia (Ed.)  Patient Details Name: Edward Thompson MRN: [id number] DOB: 1950/11/24 Today's Date: 01/04/2024 Time: 1100-1135 SLP Time Calculation (min) (ACUTE ONLY): 35 min  Assessment / Plan / Recommendation Clinical Impression  Pt seen for ongoing assessment of toleration of diet; Education on general aspiration precautions/strategies w/ the Dysphagia level 2(MINCED FOODS) diet, thin liquids. NSG reported pt did well w/ his Breakfast meal this morning- No overt s/s of aspiration reported. Pt denied any swallowing issues during the meal. Education completed w/ Brother present while pt was working w/ OT- pt is d/t Discharge today per NSG/Brother. Pt lives in the home w/ Family. Pt on RA, afebrile.   Education was provided to pt's Brother on: the Dysphagia diet- level 2(MINCED FOODS); general swallowing and oral phase Dysphagia s/p his stroke; risk for aspiration/choking w/ food not cleared from the mouth; the need to follow general aspiration precautions and swallowing strategies including use of Tongue and Finger sweeping to clear Right side of mouth between bites/end of meals. Also gave recommendation for safer swallowing of Pills by use of a PUREE, for Pill swallowing.  Pt has a Baseline of progressive genetic neuromuscular disease per chart; now w/ new stroke impairing his R side resulting in Oral weakness and dysarthria. He is on a Dysphagia level 2 (MINCED) diet w/ thin liquids w/ No overt s/s of aspiration reported by pt/NSG.    Discussed w/ Brother the information above; need for Prep support of the Foods at meals and Supervision for follow through w/ the aspiration precautions and swallowing strategies as needed(esp. R oral clearing cues). Need for Pills in PUREE for safer swallowing. Discussed that following these precautions/strategies reduces risk for aspiration/choking- Brother agreed.   Recommend continue a MINCED foods consistency  diet(Dysphagia level 2) w/ well-moistened foods; Thin liquids -- monitor straw use, and pt should Hold Cup when drinking. Recommend general aspiration precautions including Small bites/sips Slowly w/ R oral clearing b/t bites using LINGUAL and/or FINGER SWEEPING. Alternate foods/liquids during meals. Moisten foods well w/ gravies, soups. FULL tray setup at meals. Sitting up in chair Best for eating meals. Supervision at meals to support pt. Pills WHOLE vs Crushed in Puree for safer, easier swallowing -- pt has been doing this successfully w/ NSG, and it was encouraged for D/C also.  Education given on the swallowing/oral clearing strategies; Pills in Puree; food consistencies and easy to eat options; general aspiration precautions to Brother/NSG/pt. ST services to continue to monitor pt's status and toleration of diet while admitted for any needs; ST Dysarthria/Dysphagia tx to follow at D/C. NSG updated, agreed. MD updated. Recommend Dietician f/u for support. Palliative Care f/u for GOC; education and support.  Handouts on above given to NSG/on chart for Discharge. Precautions posted in chart.       HPI HPI: Edward Thompson is a 73 y.o. 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 MRI: Significantly motion degraded examination. Within this  limitation, findings are as follows.  2. 2.2 x 1.0 cm acute infarct within the left basal ganglia/corona  radiata.  3. Moderate background cerebral white matter chronic small vessel  ischemic disease.  4. Generalized cerebral atrophy.  5. Minor right maxillary  sinus mucosal thickening.      SLP Plan   Continue with current plan of care          Recommendations  Diet recommendations: Dysphagia 2 (fine chop);Thin liquid Liquids provided via: Cup;Straw (monitor) Medication Administration: Whole meds with puree (may need to CRUSH in Puree) Supervision: Patient able to self feed;Intermittent supervision to cue for compensatory strategies (setup) Compensations: Minimize environmental distractions;Small sips/bites;Slow rate;Lingual sweep for clearance of pocketing;Monitor for anterior loss;Follow solids with liquid (Finger sweep as needed along w/ lingual sweep) Postural Changes and/or Swallow Maneuvers: Out of bed for meals;Seated upright 90 degrees;Upright 30-60 min after meal                 (Dietician f/u; Palliative Care f/u for GOC, support) Oral care BID;Oral care before and after PO;Staff/trained caregiver to provide oral care (support pt w/)   Intermittent Supervision/Assistance (for follow through w/ strategies/precautions) Dysphagia, oral phase (R13.11)     Continue with current plan of care      Comer Portugal, MS, CCC-SLP Speech Language Pathologist Rehab Services; [facility] [place] [phone] (ascom) Devonn Giampietro  01/04/2024, 12:20 PM

## 2024-01-04 NOTE — Progress Notes (Signed)
 Physical Therapy Treatment Patient Details Name: Edward Thompson MRN: [id number] DOB: 09-05-1950 Today's Date: 01/04/2024   History of Present Illness Edward Thompson is a 73 y.o. 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 01/01/24: acute infarct within the left basal ganglia/corona  radiata.    PT Comments  Pt received upright in recliner agreeable to PT. Reports d/c back home later today. Pt able to grab urinal with RUE and urinate seated in recliner independently. Provided manual w/c with supervision for SPT to manual w/c. Able to self propel again using LE's around NSG unit at supervision with good turning ability. Returns to recliner via SPT at supervision with all needs in reach. Education provided on general time lines for typical recovery due to R sided weakness. Discussed process to receive OPPT referral if pt still dealing with R sided deficits after HHPT is complete. Pt understanding of education. All needs in reach. D/c recs remain appropriate.     If plan is discharge home, recommend the following: A little help with walking and/or transfers;A little help with bathing/dressing/bathroom;Assistance with cooking/housework;Assist for transportation;Help with stairs or ramp for entrance   Can travel by private vehicle        Equipment Recommendations  None recommended by PT    Recommendations for Other Services       Precautions / Restrictions Precautions Precautions: Fall Recall of Precautions/Restrictions: Intact Restrictions Weight Bearing Restrictions Per Provider Order: No     Mobility  Bed Mobility                General bed mobility comments: not tested Patient Response: Cooperative, Flat affect  Transfers Overall transfer level: Needs assistance Equipment used: None Transfers: Bed to chair/wheelchair/BSC     Step pivot transfers: Supervision       General transfer comment: UE support on arm rest and w/c arm support to complete transfer recliner to w/c and w/c back to recliner    Ambulation/Gait Ambulation/Gait assistance: Supervision Gait Distance (Feet): 180 Feet Assistive device: Pushed wheelchair         General Gait Details: self propelled manual w/c using LE's. Fatigues 3/4 of the lap around NSG station but does not require stopping rest break   Stairs             Wheelchair Mobility     Tilt Bed Tilt Bed Patient Response: Cooperative, Flat affect  Modified Rankin (Stroke Patients Only)       Balance Overall balance assessment: Needs assistance Sitting-balance support: No upper extremity supported, Feet supported Sitting balance-Leahy Scale: Good     Standing balance support: Bilateral upper extremity supported Standing balance-Leahy Scale: Good                              Communication Communication Communication: Impaired Factors Affecting Communication: Reduced clarity of speech  Cognition Arousal: Alert Behavior During Therapy: WFL for tasks assessed/performed   PT - Cognitive impairments: No apparent impairments                         Following commands: Intact      Cueing Cueing Techniques:  Verbal cues  Exercises Other Exercises Other Exercises: discussed timelines for most likely positive prognosis for return to R sided weakness (first 6 months). Discussed importance of advocating for himself at PCP f/u if weakness persists for OPPT order.    General Comments        Pertinent Vitals/Pain Pain Assessment Pain Assessment: No/denies pain    Home Living                          Prior  Function            PT Goals (current goals can now be found in the care plan section) Acute Rehab PT Goals Patient Stated Goal: to improve R sided strength PT Goal Formulation: With patient Time For Goal Achievement: 01/16/24 Potential to Achieve Goals: Good Progress towards PT goals: Progressing toward goals    Frequency    Min 4X/week      PT Plan      Co-evaluation              AM-PAC PT 6 Clicks Mobility   Outcome Measure  Help needed turning from your back to your side while in a flat bed without using bedrails?: A Little Help needed moving from lying on your back to sitting on the side of a flat bed without using bedrails?: A Little Help needed moving to and from a bed to a chair (including a wheelchair)?: A Little Help needed standing up from a chair using your arms (e.g., wheelchair or bedside chair)?: A Little Help needed to walk in hospital room?: A Lot Help needed climbing 3-5 steps with a railing? : Total 6 Click Score: 15    End of Session Equipment Utilized During Treatment: Gait belt Activity Tolerance: Patient tolerated treatment well Patient left: in chair;with call bell/phone within reach;with chair alarm set Nurse Communication: Mobility status PT Visit Diagnosis: Muscle weakness (generalized) (M62.81);Other abnormalities of gait and mobility (R26.89);History of falling (Z91.81)     Time: 1355-1420 PT Time Calculation (min) (ACUTE ONLY): 25 min  Charges:    $Gait Training: 8-22 mins $Self Care/Home Management: 8-22 PT General Charges $$ ACUTE PT VISIT: 1 Visit                     Dorina HERO. Fairly IV, PT, DPT Physical Therapist- [place]  [facility] 01/04/2024, 2:30 PM

## 2024-01-04 NOTE — TOC Transition Note (Signed)
 Transition of Care [facility]) - Discharge Note   Patient Details  Name: Edward Thompson MRN: [id number] Date of Birth: 1950/08/03  Transition of Care [facility] CM/SW Contact:  Dalia GORMAN Fuse, RN Phone Number: 01/04/2024, 1:12 PM   Clinical Narrative:     Recs received for PT/OT/SLP. TOC spoke with the patients daughter [name] and she confirmed the patient used [facility] in the past. [name] would like to use them again. TOC sent referral to  [facility] for PT/OT/SLP and Larraine accepted. No other TOC Needs.          Patient Goals and CMS Choice            Discharge Placement                       Discharge Plan and Services Additional resources added to the After Visit Summary for                                       Social Drivers of Health (SDOH) Interventions SDOH Screenings   Food Insecurity: No Food Insecurity (01/01/2024)  Housing: Unknown (01/01/2024)  Transportation Needs: No Transportation Needs (01/01/2024)  Utilities: Not At Risk (01/01/2024)  Financial Resource Strain: Low Risk  (02/12/2023)   Received from [facility]  Social Connections: Unknown (01/03/2024)  Tobacco Use: Low Risk  (12/12/2023)   [facility]     Readmission Risk Interventions    08/07/2021    9:31 AM  Readmission Risk Prevention Plan  Transportation Screening Complete  PCP or Specialist Appt within 5-7 Days Complete  Home Care Screening Complete  Medication Review (RN CM) Complete

## 2024-01-04 NOTE — Discharge Summary (Signed)
 Physician Discharge Summary   Patient: Edward Thompson MRN: [id number] DOB: Jan 25, 1951  Admit date:     01/01/2024  Discharge date: 01/04/24  Discharge Physician: AIDA CHO   PCP: Albina GORMAN Dine, MD   Recommendations at discharge:   Follow-up with PCP in 1 week Follow-up with Dr. Davie, neurologist, in 1 month   Discharge Diagnoses: Principal Problem:   Stroke [facility]) Active Problems:   Cystitis  Resolved Problems:   * No resolved hospital problems. *  Hospital Course:  Edward Thompson is a 73 y.o. 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  He was found to have acute stroke involving the left basal ganglia/corona radiata.  Workup also reviewed acute UTI.    Assessment and Plan:   Acute right-sided hemiparesis Acute dysarthria Left-sided basal ganglia and corona radiata stroke, likely thrombotic MRI brain was motion degraded but showed 2.2 x 1.0 cm acute infarct within the left basal ganglia / corona Radiata, generalized atrophy, moderate chronic small vessel ischemic disease. CTA head/neck showed significant vascular disease:  1. Chronically occluded right P1. 2. Severe left paraclinoid ICA stenosis. 3. Severe bilateral intradural vertebral artery stenosis. 4. Moderate basilar artery stenosis ... Continue aspirin , Plavix  and statin Dysphagia 2 diet recommended by speech therapist for dysphagia 2D echo showed normal EF, grade 1 diastolic dysfunction and no evidence of interatrial shunt. PT recommended home health therapy   Acute UTI Urine culture showed Klebsiella pneumoniae.  Patient was  treated with 4 days of IV ceftriaxone .  He will be discharged on Keflex  (based on sensitivity report) for 3 more days to complete 7 days of treatment.    Acute metabolic encephalopathy Improved.    Hx of Hypertension - BP meds initially held for permissive HTN. --Resume home Lasix , Imdur , metoprolol  --Monitor BP & titrate regimen   Hx of CAD s/p stent - stable no chest pain --Continue ASA, Plavix , statin, beta blocker   Hx of Seizure disorder --Continue home phenytoin , phenobarbital    Comorbidities include: Hypertension CAD s/p coronary stent Chronic neurogenic bladder Chronic urinary retention on self-catheterization Type II DM Neuromuscular disease with ambulatory dysfunction Chronic pedal edema   His condition has improved and he is stable for discharge to home today.  Discharge plan was discussed with Charlie, his brother at the bedside.  Plan of care was also discussed with [name], daughter, over the phone.       Consultants: Neurologist Procedures performed: None Disposition: Home health Diet recommendation:  Discharge Diet Orders (From admission, onward)     Start     Ordered   01/04/24 0000  Diet - low sodium heart healthy        01/04/24 1054   01/04/24 0000  Diet Carb Modified        01/04/24 1054           Dysphagia type 2 thin Liquid DISCHARGE MEDICATION: Allergies as of 01/04/2024   No Known Allergies      Medication List     STOP taking these medications    doxycycline 100 MG tablet Commonly known as: ADOXA       TAKE these medications    Accu-Chek Guide test strip Generic drug: glucose  blood USE WITH DIABETIC METER TO TEST BLOOD SUGAR THREE TIMES DAILY   Accu-Chek Guide w/Device Kit   Accu-Chek Softclix Lancets lancets TEST BLOOD SUGAR THREE TIMES DAILY   acetaminophen  500 MG tablet Commonly known as: TYLENOL  Take 500 mg by mouth every 6 (six) hours as needed.   aspirin  EC 81 MG tablet Take 81 mg by mouth at bedtime.    cephALEXin  500 MG capsule Commonly known as: KEFLEX  Take 1 capsule (500 mg total) by mouth 3 (three) times daily for 3 days. Start taking on: January 05, 2024   clopidogrel  75 MG tablet Commonly known as: PLAVIX  TAKE 1 TABLET EVERY DAY   furosemide  20 MG tablet Commonly known as: LASIX  Take 1 tablet by mouth once daily   isosorbide  mononitrate 30 MG 24 hr tablet Commonly known as: IMDUR  TAKE 1 TABLET EVERY DAY   meclizine  25 MG tablet Commonly known as: ANTIVERT  Take 1 tablet (25 mg total) by mouth in the morning, at noon, in the evening, and at bedtime. TAKE 1 TABLET FOUR TIMES DAILY   metFORMIN  500 MG tablet Commonly known as: GLUCOPHAGE  Take 1 tablet (500 mg total) by mouth every morning.   metoprolol  succinate 25 MG 24 hr tablet Commonly known as: TOPROL -XL Take 0.5 tablets (12.5 mg total) by mouth daily.   nitroGLYCERIN  0.4 MG SL tablet Commonly known as: NITROSTAT  Place 0.4 mg under the tongue every 5 (five) minutes as needed for chest pain.   pantoprazole  40 MG tablet Commonly known as: PROTONIX  Take 1 tablet (40 mg total) by mouth every morning.   PHENobarbital  97.2 MG tablet Commonly known as: LUMINAL Take 1 tablet (97.2 mg total) by mouth at bedtime.   phenytoin  30 MG ER capsule Commonly known as: Dilantin  Take 1 capsule (30 mg total) by mouth daily. Take with 1 capsule 200 mg dose for total daily dose 230 mg   phenytoin  200 MG ER capsule Commonly known as: DILANTIN  Take 1 capsule (200 mg total) by mouth daily. Take with 1 capsule 30 mg dose for total daily dose 230 mg   rosuvastatin  40 MG tablet Commonly known as: CRESTOR  Take 1 tablet (40 mg total) by mouth at bedtime.   senna-docusate 8.6-50 MG tablet Commonly known as: Senokot-S Take 1 tablet by mouth at bedtime.        Discharge Exam: Filed Weights   01/01/24 1318  Weight: 88.5 kg   GEN: NAD SKIN: Warm and dry EYES: No pallor or icterus ENT: MMM CV: RRR PULM: CTA B ABD: soft, ND,  NT, +BS CNS: AAO x 3, non focal, mild slurred speech EXT: Bilateral pedal edema, no tenderness   Condition at discharge: good  The results of significant diagnostics from this hospitalization (including imaging, microbiology, ancillary and laboratory) are listed below for reference.   Imaging Studies: ECHOCARDIOGRAM COMPLETE Result Date: 01/02/2024    ECHOCARDIOGRAM REPORT   Patient Name:   HASTEN SWEITZER Faerber Date of Exam: 01/02/2024 Medical Rec #:  [id number]      Height:       64.0 in Accession #:    [id number]     Weight:       195.0 lb Date of Birth:  23-Sep-1950     BSA:          1.935 m Patient Age:    72 years       BP:           140/67 mmHg Patient Gender: M  HR:           83 bpm. Exam Location:  [facility] Procedure: 2D Echo, Cardiac Doppler and Color Doppler (Both Spectral and Color            Flow Doppler were utilized during procedure). Indications:     Stroke I63.9  History:         Patient has prior history of Echocardiogram examinations, most                  recent 02/09/2023. Stroke.  Sonographer:     Rosina Dunk Referring Phys:  [id number] CORT ONEIDA MANA Diagnosing Phys: Deatrice Cage MD IMPRESSIONS  1. Left ventricular ejection fraction, by estimation, is 55 to 60%. The left ventricle has normal function. The left ventricle has no regional wall motion abnormalities. There is mild left ventricular hypertrophy. Left ventricular diastolic parameters are consistent with Grade I diastolic dysfunction (impaired relaxation).  2. Right ventricular systolic function is normal. The right ventricular size is normal. Tricuspid regurgitation signal is inadequate for assessing PA pressure.  3. Left atrial size was mildly dilated.  4. The mitral valve is normal in structure. No evidence of mitral valve regurgitation. No evidence of mitral stenosis.  5. The aortic valve is normal in structure. Aortic valve regurgitation is trivial. Aortic valve sclerosis/calcification is present, without any  evidence of aortic stenosis.  6. Aortic dilatation noted. There is moderate dilatation of the ascending aorta, measuring 44 mm. FINDINGS  Left Ventricle: Left ventricular ejection fraction, by estimation, is 55 to 60%. The left ventricle has normal function. The left ventricle has no regional wall motion abnormalities. The left ventricular internal cavity size was normal in size. There is  mild left ventricular hypertrophy. Left ventricular diastolic parameters are consistent with Grade I diastolic dysfunction (impaired relaxation). Right Ventricle: The right ventricular size is normal. No increase in right ventricular wall thickness. Right ventricular systolic function is normal. Tricuspid regurgitation signal is inadequate for assessing PA pressure. Left Atrium: Left atrial size was mildly dilated. Right Atrium: Right atrial size was normal in size. Pericardium: There is no evidence of pericardial effusion. Mitral Valve: The mitral valve is normal in structure. No evidence of mitral valve regurgitation. No evidence of mitral valve stenosis. MV peak gradient, 5.1 mmHg. The mean mitral valve gradient is 2.0 mmHg. Tricuspid Valve: The tricuspid valve is normal in structure. Tricuspid valve regurgitation is not demonstrated. No evidence of tricuspid stenosis. Aortic Valve: The aortic valve is normal in structure. Aortic valve regurgitation is trivial. Aortic valve sclerosis/calcification is present, without any evidence of aortic stenosis. Aortic valve mean gradient measures 2.0 mmHg. Aortic valve peak gradient measures 3.2 mmHg. Aortic valve area, by VTI measures 3.58 cm. Pulmonic Valve: The pulmonic valve was normal in structure. Pulmonic valve regurgitation is not visualized. No evidence of pulmonic stenosis. Aorta: Aortic dilatation noted. There is moderate dilatation of the ascending aorta, measuring 44 mm. Venous: The inferior vena cava was not well visualized. IAS/Shunts: No atrial level shunt detected by  color flow Doppler.  LEFT VENTRICLE PLAX 2D LVIDd:         3.20 cm     Diastology LVIDs:         2.30 cm     LV e' medial:    5.51 cm/s LV PW:         1.30 cm     LV E/e' medial:  10.7 LV IVS:        1.10 cm  LV e' lateral:   5.26 cm/s LVOT diam:     2.30 cm     LV E/e' lateral: 11.3 LV SV:         72 LV SV Index:   37 LVOT Area:     4.15 cm  LV Volumes (MOD) LV vol d, MOD A2C: 50.7 ml LV vol d, MOD A4C: 55.5 ml LV vol s, MOD A2C: 15.5 ml LV vol s, MOD A4C: 23.9 ml LV SV MOD A2C:     35.2 ml LV SV MOD A4C:     55.5 ml LV SV MOD BP:      33.8 ml RIGHT VENTRICLE RV Basal diam:  3.20 cm RV Mid diam:    3.20 cm RV S prime:     8.51 cm/s TAPSE (M-mode): 1.3 cm LEFT ATRIUM             Index        RIGHT ATRIUM           Index LA Vol (A2C):   84.5 ml 43.66 ml/m  RA Area:     15.80 cm LA Vol (A4C):   63.8 ml 32.96 ml/m  RA Volume:   34.90 ml  18.03 ml/m LA Biplane Vol: 74.2 ml 38.34 ml/m  AORTIC VALVE                    PULMONIC VALVE AV Area (Vmax):    3.89 cm     PV Vmax:        0.98 m/s AV Area (Vmean):   3.82 cm     PV Vmean:       67.400 cm/s AV Area (VTI):     3.58 cm     PV VTI:         0.210 m AV Vmax:           89.50 cm/s   PV Peak grad:   3.8 mmHg AV Vmean:          61.600 cm/s  PV Mean grad:   2.0 mmHg AV VTI:            0.201 m      RVOT Peak grad: 3 mmHg AV Peak Grad:      3.2 mmHg AV Mean Grad:      2.0 mmHg LVOT Vmax:         83.80 cm/s LVOT Vmean:        56.600 cm/s LVOT VTI:          0.173 m LVOT/AV VTI ratio: 0.86  AORTA Ao Root diam: 4.40 cm Ao Asc diam:  4.40 cm MITRAL VALVE MV Area (PHT): 4.15 cm     SHUNTS MV Area VTI:   3.64 cm     Systemic VTI:  0.17 m MV Peak grad:  5.1 mmHg     Systemic Diam: 2.30 cm MV Mean grad:  2.0 mmHg     Pulmonic VTI:  0.234 m MV Vmax:       1.13 m/s MV Vmean:      75.1 cm/s MV Decel Time: 183 msec MV E velocity: 59.20 cm/s MV A velocity: 113.00 cm/s MV E/A ratio:  0.52 Deatrice Cage MD Electronically signed by Deatrice Cage MD Signature Date/Time:  01/02/2024/11:24:47 AM    Final    CT ANGIO HEAD NECK W WO CM Result Date: 01/01/2024 CLINICAL DATA:  Neuro deficit, acute, stroke suspected EXAM: CT ANGIOGRAPHY HEAD AND NECK WITH AND WITHOUT CONTRAST TECHNIQUE:  Multidetector CT imaging of the head and neck was performed using the standard protocol during bolus administration of intravenous contrast. Multiplanar CT image reconstructions and MIPs were obtained to evaluate the vascular anatomy. Carotid stenosis measurements (when applicable) are obtained utilizing NASCET criteria, using the distal internal carotid diameter as the denominator. RADIATION DOSE REDUCTION: This exam was performed according to the departmental dose-optimization program which includes automated exposure control, adjustment of the mA and/or kV according to patient size and/or use of iterative reconstruction technique. CONTRAST:  OMNIPAQUE  IOHEXOL  350 MG/ML SOLN COMPARISON:  Same day MRI head and CT head. CTA head/neck August 21, 2017. FINDINGS: CTA NECK FINDINGS Aortic arch: Great vessel origins are patent without significant stenosis. Right carotid system: No evidence of dissection, stenosis (50% or greater), or occlusion. Left carotid system: No evidence of dissection, stenosis (50% or greater), or occlusion. Vertebral arteries: Codominant. No evidence of dissection, stenosis (50% or greater), or occlusion. Mild narrowing of the left vertebral artery origin. Skeleton: No evidence of acute abnormality. Remote appearing compression fractures of multiple upper th the oracic vertebral bodies with mild height loss. Other neck: No acute abnormality. Upper chest: Small layering bilateral pleural effusions. Patchy dependent opacities in the lung apices. Review of the MIP images confirms the above findings CTA HEAD FINDINGS Anterior circulation: Hypoplastic left A1 ACA. Otherwise, bilateral intracranial ICAs, MCAs, and ACAs are patent. Severe left and mild right paraclinoid ICA stenosis.  Posterior circulation: Bilateral intradural vertebral arteries, basilar artery are patent. Severe bilateral intradural vertebral artery stenosis. Moderate basilar artery stenosis. The right P1 PCA is chronically occluded. Left PCA is patent without proximal hemodynamically significant stenosis. Self E limbus please self E please LVAD Venous sinuses: As permitted by contrast timing, patent. Review of the MIP images confirms the above findings IMPRESSION: 1. Chronically occluded right P1. 2. Severe left paraclinoid ICA stenosis. 3. Severe bilateral intradural vertebral artery stenosis. 4. Moderate basilar artery stenosis. 5. Small layering bilateral pleural effusions. Patchy dependent opacities in the lung apices which could represent atelectasis, aspiration or pneumonia. Electronically Signed   By: Gilmore GORMAN Molt M.D.   On: 01/01/2024 19:29   MR BRAIN WO CONTRAST Result Date: 01/01/2024 CLINICAL DATA:  Provided history: Right-sided facial droop. Aphasia. Evaluate CVA. EXAM: MRI HEAD WITHOUT CONTRAST TECHNIQUE: Multiplanar, multiecho pulse sequences of the brain and surrounding structures were obtained without intravenous contrast. COMPARISON:  Head CT 01/01/2024.  Brain MRI 02/09/2023. FINDINGS: The examination is intermittently motion degraded. Most notably, the axial T2 sequence is moderate-to-severely motion degraded, the axial FLAIR sequence is severely motion degraded, the axial SWI sequence is moderately motion degraded and the axial T1 sequence is moderately motion degraded. Within this limitation, findings are as follows. Brain: Generalized cerebral atrophy. 2.2 x 1.0 cm acute infarct within the left basal ganglia/corona radiata. Moderate multifocal T2 FLAIR hyperintense signal abnormality elsewhere in the cerebral white matter, nonspecific but compatible with chronic small vessel ischemic disease. No evidence of an intracranial mass. No chronic intracranial blood products. No extra-axial fluid  collection. No midline shift. Vascular: Maintained flow voids within the proximal large arterial vessels. Skull and upper cervical spine: No focal worrisome marrow lesion. Sinuses/Orbits: No mass or acute finding within the imaged orbits. Minimal mucosal thickening within the right maxillary sinus. IMPRESSION: 1. Significantly motion degraded examination. Within this limitation, findings are as follows. 2. 2.2 x 1.0 cm acute infarct within the left basal ganglia/corona radiata. 3. Moderate background cerebral white matter chronic small vessel ischemic disease. 4. Generalized cerebral atrophy.  5. Minor right maxillary sinus mucosal thickening. Electronically Signed   By: Rockey Childs D.O.   On: 01/01/2024 16:50   CT HEAD WO CONTRAST Result Date: 01/01/2024 CLINICAL DATA:  Aphasia. EXAM: CT HEAD WITHOUT CONTRAST TECHNIQUE: Contiguous axial images were obtained from the base of the skull through the vertex without intravenous contrast. RADIATION DOSE REDUCTION: This exam was performed according to the departmental dose-optimization program which includes automated exposure control, adjustment of the mA and/or kV according to patient size and/or use of iterative reconstruction technique. COMPARISON:  02/08/2023 FINDINGS: Brain: No evidence of intracranial hemorrhage, hydrocephalus, extra-axial collection, or mass lesion/mass effect. Mild chronic small vessel disease noted. An ill-defined area of decreased attenuation is seen involving the left thalamus, basal ganglia, and deep periventricular white matter. This was not seen on previous study, and likely represents a subacute infarct. Vascular:  No hyperdense vessel or other acute findings. Skull: No evidence of fracture or other significant bone abnormality. Sinuses/Orbits:  No acute findings. Other: None. IMPRESSION: Probable subacute infarct involving the left thalamus, basal ganglia, and deep periventricular white matter. Consider brain MRI without and with  contrast for further evaluation. Cerebral atrophy and chronic small vessel disease. Electronically Signed   By: Norleen DELENA Kil M.D.   On: 01/01/2024 14:20    Microbiology: Results for orders placed or performed during the hospital encounter of 01/01/24  Urine Culture     Status: Abnormal   Collection Time: 01/01/24  3:04 PM   Specimen: Urine, Clean Catch  Result Value Ref Range Status   Specimen Description   Final    URINE, CLEAN CATCH Performed at [facility], [address] [place], [place] [address]    Special Requests   Final    NONE Performed at [facility], [address], [place], [place] [address]    Culture >=100,000 COLONIES/mL KLEBSIELLA PNEUMONIAE (A)  Final   Report Status 01/04/2024 FINAL  Final   Organism ID, Bacteria KLEBSIELLA PNEUMONIAE (A)  Final      Susceptibility   Klebsiella pneumoniae - MIC*    AMPICILLIN RESISTANT Resistant     CEFAZOLIN  (URINE) Value in next row Sensitive      <=1 SENSITIVEThis is a modified FDA-approved test that has been validated and its performance characteristics determined by the reporting laboratory.  This laboratory is certified under the Clinical Laboratory Improvement Amendments CLIA as qualified to perform high complexity clinical laboratory testing.    CEFEPIME  Value in next row Sensitive      <=1 SENSITIVEThis is a modified FDA-approved test that has been validated and its performance characteristics determined by the reporting laboratory.  This laboratory is certified under the Clinical Laboratory Improvement Amendments CLIA as qualified to perform high complexity clinical laboratory testing.    ERTAPENEM Value in next row Sensitive      <=1 SENSITIVEThis is a modified FDA-approved test that has been validated and its performance characteristics determined by the reporting laboratory.  This laboratory is certified under the Clinical Laboratory Improvement Amendments CLIA as qualified to perform high complexity  clinical laboratory testing.    CEFTRIAXONE  Value in next row Sensitive      <=1 SENSITIVEThis is a modified FDA-approved test that has been validated and its performance characteristics determined by the reporting laboratory.  This laboratory is certified under the Clinical Laboratory Improvement Amendments CLIA as qualified to perform high complexity clinical laboratory testing.    CIPROFLOXACIN  Value in next row Sensitive      <=1 SENSITIVEThis is  a modified FDA-approved test that has been validated and its performance characteristics determined by the reporting laboratory.  This laboratory is certified under the Clinical Laboratory Improvement Amendments CLIA as qualified to perform high complexity clinical laboratory testing.    GENTAMICIN Value in next row Sensitive      <=1 SENSITIVEThis is a modified FDA-approved test that has been validated and its performance characteristics determined by the reporting laboratory.  This laboratory is certified under the Clinical Laboratory Improvement Amendments CLIA as qualified to perform high complexity clinical laboratory testing.    NITROFURANTOIN  Value in next row Intermediate      <=1 SENSITIVEThis is a modified FDA-approved test that has been validated and its performance characteristics determined by the reporting laboratory.  This laboratory is certified under the Clinical Laboratory Improvement Amendments CLIA as qualified to perform high complexity clinical laboratory testing.    TRIMETH /SULFA  Value in next row Sensitive      <=1 SENSITIVEThis is a modified FDA-approved test that has been validated and its performance characteristics determined by the reporting laboratory.  This laboratory is certified under the Clinical Laboratory Improvement Amendments CLIA as qualified to perform high complexity clinical laboratory testing.    AMPICILLIN/SULBACTAM Value in next row Sensitive      <=1 SENSITIVEThis is a modified FDA-approved test that has been  validated and its performance characteristics determined by the reporting laboratory.  This laboratory is certified under the Clinical Laboratory Improvement Amendments CLIA as qualified to perform high complexity clinical laboratory testing.    PIP/TAZO Value in next row Sensitive ug/mL     <=4 SENSITIVEThis is a modified FDA-approved test that has been validated and its performance characteristics determined by the reporting laboratory.  This laboratory is certified under the Clinical Laboratory Improvement Amendments CLIA as qualified to perform high complexity clinical laboratory testing.    MEROPENEM  Value in next row Sensitive      <=4 SENSITIVEThis is a modified FDA-approved test that has been validated and its performance characteristics determined by the reporting laboratory.  This laboratory is certified under the Clinical Laboratory Improvement Amendments CLIA as qualified to perform high complexity clinical laboratory testing.    * >=100,000 COLONIES/mL KLEBSIELLA PNEUMONIAE    Labs: CBC: Recent Labs  Lab 01/01/24 1321 01/04/24 0331  WBC 8.1 8.3  NEUTROABS 5.7  --   HGB 12.2* 11.4*  HCT 37.2* 34.6*  MCV 88.8 88.0  PLT 196 181   Basic Metabolic Panel: Recent Labs  Lab 01/01/24 1321 01/04/24 0331  NA 135 137  K 3.6 3.8  CL 103 104  CO2 23 24  GLUCOSE 136* 110*  BUN 15 15  CREATININE 0.67 0.65  CALCIUM  9.1 8.6*   Liver Function Tests: Recent Labs  Lab 01/01/24 1321  AST 16  ALT 12  ALKPHOS 83  BILITOT 0.6  PROT 7.7  ALBUMIN 4.1   CBG: Recent Labs  Lab 01/03/24 0707 01/03/24 1043 01/03/24 1634 01/03/24 1952 01/04/24 0746  GLUCAP 98 134* 142* 151* 110*    Discharge time spent: greater than 30 minutes.  Signed: AIDA CHO, MD [facility] 01/04/2024

## 2024-01-04 NOTE — Progress Notes (Signed)
 PHARMACIST - PHYSICIAN COMMUNICATION  CONCERNING: IV to Oral Route Change Policy  RECOMMENDATION: This patient is receiving pantoprazole  by the intravenous route.  Based on criteria approved by the Pharmacy and Therapeutics Committee, the intravenous medication(s) is/are being converted to the equivalent oral dose form(s).  DESCRIPTION: These criteria include: The patient is eating (either orally or via tube) and/or has been taking other orally administered medications for a least 24 hours The patient has no evidence of active gastrointestinal bleeding or impaired GI absorption (gastrectomy, short bowel, patient on TNA or NPO).  If you have questions about this conversion, please contact the Pharmacy Department  []   [phone] )  [facility] [x]   [phone] )  [facility] []   [phone] )  [facility] []   [phone] )  [facility] []   [phone] )  [facility]    Thank you for involving pharmacy in this patient's care.   Damien Napoleon, PharmD Clinical Pharmacist 01/04/2024 10:02 AM

## 2024-01-04 NOTE — Progress Notes (Signed)
 Occupational Therapy Treatment Patient Details Name: Edward Thompson MRN: [id number] DOB: [birth year] Today's Date: 01/04/2024   History of present illness Edward Thompson is a 73 y.o. 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 01/01/24: acute infarct within the left basal ganglia/corona  radiata.   OT comments  Edward Thompson was seen for OT treatment on this date. Upon arrival to room pt seated EOB, agreeable to tx. Pt requires SUPERVISION don B shoes in sitting and SUP bed>chair step pivot t/f to simulate home w/c t/f. Reviewed HEP and yellow theraband provided with handout. Pt making good progress toward goals, will continue to follow POC. Discharge recommendation remains appropriate.        If plan is discharge home, recommend the following:  A little help with bathing/dressing/bathroom;Help with stairs or ramp for entrance   Equipment Recommendations  BSC/3in1    Recommendations for Other Services      Precautions / Restrictions Precautions Precautions: Fall Recall of Precautions/Restrictions: Intact Restrictions Weight Bearing Restrictions Per Provider Order: No       Mobility Bed Mobility               General bed mobility comments: not tested    Transfers Overall transfer level: Needs assistance Equipment used: None Transfers: Bed to chair/wheelchair/BSC       Step pivot transfers: Supervision           Balance Overall balance assessment: Needs assistance Sitting-balance support: No upper extremity supported, Feet supported Sitting balance-Leahy Scale: Good     Standing balance support:  Bilateral upper extremity supported Standing balance-Leahy Scale: Good                             ADL either performed or assessed with clinical judgement   ADL Overall ADL's : Needs assistance/impaired                                       General ADL Comments: SUPERVISION don B shoes in sitting and simulated w/c t/f.    Extremity/Trunk Assessment              Occupational psychologist Communication: Impaired Factors Affecting Communication: Reduced clarity of speech   Cognition Arousal: Alert Behavior During Therapy: WFL for tasks assessed/performed Cognition: No apparent impairments                               Following commands: Intact        Cueing   Cueing Techniques: Verbal cues  Exercises Exercises: General Upper Extremity General Exercises - Upper Extremity Shoulder Flexion: AROM, Strengthening, Right, 5 reps, Seated, Theraband Theraband Level (Shoulder Flexion): Level 1 (Yellow) Shoulder Horizontal ABduction: AROM, Strengthening, Right, 5 reps, Seated, Theraband Theraband Level (Shoulder Horizontal Abduction): Level 1 (Yellow) Elbow Flexion: AROM, Strengthening, Right, 5 reps, Seated, Theraband Theraband Level (Elbow Flexion): Level 1 (Yellow)    Shoulder Instructions  General Comments      Pertinent Vitals/ Pain       Pain Assessment Pain Assessment: No/denies pain   Frequency  Min 3X/week        Progress Toward Goals  OT Goals(current goals can now be found in the care plan section)  Progress towards OT goals: Progressing toward goals  Acute Rehab OT Goals OT Goal Formulation: With patient Time For Goal Achievement: 01/16/24 Potential to Achieve Goals: Good ADL Goals Pt Will Perform Grooming: with modified independence;sitting Pt Will Perform Lower Body Dressing: with modified independence;sitting/lateral leans Pt Will Transfer to  Toilet: with modified independence;stand pivot transfer;bedside commode  Plan      Co-evaluation                 AM-PAC OT 6 Clicks Daily Activity     Outcome Measure   Help from another person eating meals?: None Help from another person taking care of personal grooming?: A Little Help from another person toileting, which includes using toliet, bedpan, or urinal?: A Little Help from another person bathing (including washing, rinsing, drying)?: A Little Help from another person to put on and taking off regular upper body clothing?: None Help from another person to put on and taking off regular lower body clothing?: A Little 6 Click Score: 20    End of Session    OT Visit Diagnosis: Other abnormalities of gait and mobility (R26.89);Muscle weakness (generalized) (M62.81)   Activity Tolerance Patient tolerated treatment well   Patient Left in chair;with call bell/phone within reach;with chair alarm set   Nurse Communication          Time: 8883-8861 OT Time Calculation (min): 22 min  Charges: OT General Charges $OT Visit: 1 Visit OT Treatments $Self Care/Home Management : 8-22 mins  Elston Slot, M.S. OTR/L  01/04/24, 12:14 PM  ascom [phone]

## 2024-01-05 ENCOUNTER — Telehealth: Payer: Self-pay

## 2024-01-05 NOTE — Transitions of Care (Post Inpatient/ED Visit) (Addendum)
   01/05/2024  Name: ZAVIYAR RAHAL MRN: [id number] DOB: [birth year]  Today's TOC FU Call Status: Today's TOC FU Call Status:: Unsuccessful Call (1st Attempt) Unsuccessful Call (1st Attempt) Date: 01/05/24 declines further call attempts at this time, spoke with daughter.  Attempted to reach the patient regarding the most recent Inpatient/ED visit. Spoke with daughter who works and declines as patient doesn't talk on the phone.  Encouraged to contact PCP for hospital follow up and she said she would call.  Follow Up Plan: No further outreach attempts will be made at this time. We have been unable to contact the patient.  Richerd Fish, RN, BSN, CCM [facility], [facility] RN Care Manager Direct Dial: [phone]

## 2024-01-06 DIAGNOSIS — R339 Retention of urine, unspecified: Secondary | ICD-10-CM | POA: Diagnosis not present

## 2024-01-11 ENCOUNTER — Telehealth: Payer: Self-pay

## 2024-01-11 DIAGNOSIS — I10 Essential (primary) hypertension: Secondary | ICD-10-CM | POA: Diagnosis not present

## 2024-01-11 DIAGNOSIS — I69322 Dysarthria following cerebral infarction: Secondary | ICD-10-CM | POA: Diagnosis not present

## 2024-01-11 DIAGNOSIS — G3184 Mild cognitive impairment, so stated: Secondary | ICD-10-CM | POA: Diagnosis not present

## 2024-01-11 DIAGNOSIS — N390 Urinary tract infection, site not specified: Secondary | ICD-10-CM | POA: Diagnosis not present

## 2024-01-11 DIAGNOSIS — E119 Type 2 diabetes mellitus without complications: Secondary | ICD-10-CM | POA: Diagnosis not present

## 2024-01-11 DIAGNOSIS — G9349 Other encephalopathy: Secondary | ICD-10-CM | POA: Diagnosis not present

## 2024-01-11 DIAGNOSIS — I69351 Hemiplegia and hemiparesis following cerebral infarction affecting right dominant side: Secondary | ICD-10-CM | POA: Diagnosis not present

## 2024-01-11 DIAGNOSIS — I2510 Atherosclerotic heart disease of native coronary artery without angina pectoris: Secondary | ICD-10-CM | POA: Diagnosis not present

## 2024-01-11 DIAGNOSIS — I69391 Dysphagia following cerebral infarction: Secondary | ICD-10-CM | POA: Diagnosis not present

## 2024-01-11 NOTE — Telephone Encounter (Signed)
 [facility] is requesting verbal orders for pt to receive PT once a week for 7 weeks.

## 2024-01-12 DIAGNOSIS — G3184 Mild cognitive impairment, so stated: Secondary | ICD-10-CM | POA: Diagnosis not present

## 2024-01-12 DIAGNOSIS — G9349 Other encephalopathy: Secondary | ICD-10-CM | POA: Diagnosis not present

## 2024-01-12 DIAGNOSIS — I2510 Atherosclerotic heart disease of native coronary artery without angina pectoris: Secondary | ICD-10-CM | POA: Diagnosis not present

## 2024-01-12 DIAGNOSIS — I69322 Dysarthria following cerebral infarction: Secondary | ICD-10-CM | POA: Diagnosis not present

## 2024-01-12 DIAGNOSIS — N390 Urinary tract infection, site not specified: Secondary | ICD-10-CM | POA: Diagnosis not present

## 2024-01-12 DIAGNOSIS — E119 Type 2 diabetes mellitus without complications: Secondary | ICD-10-CM | POA: Diagnosis not present

## 2024-01-12 DIAGNOSIS — I69351 Hemiplegia and hemiparesis following cerebral infarction affecting right dominant side: Secondary | ICD-10-CM | POA: Diagnosis not present

## 2024-01-12 DIAGNOSIS — I10 Essential (primary) hypertension: Secondary | ICD-10-CM | POA: Diagnosis not present

## 2024-01-12 DIAGNOSIS — I69391 Dysphagia following cerebral infarction: Secondary | ICD-10-CM | POA: Diagnosis not present

## 2024-01-12 NOTE — Telephone Encounter (Signed)
 [place]an OT with [facility] called asking for verbal order for 1w X 1w, skip a week then 1w X 5w [phone]

## 2024-01-16 ENCOUNTER — Encounter: Payer: Self-pay | Admitting: Internal Medicine

## 2024-01-16 ENCOUNTER — Ambulatory Visit (INDEPENDENT_AMBULATORY_CARE_PROVIDER_SITE_OTHER): Admitting: Internal Medicine

## 2024-01-16 VITALS — BP 120/68 | HR 81 | Ht 64.0 in | Wt 195.0 lb

## 2024-01-16 DIAGNOSIS — I69391 Dysphagia following cerebral infarction: Secondary | ICD-10-CM | POA: Diagnosis not present

## 2024-01-16 DIAGNOSIS — E119 Type 2 diabetes mellitus without complications: Secondary | ICD-10-CM

## 2024-01-16 DIAGNOSIS — I6389 Other cerebral infarction: Secondary | ICD-10-CM

## 2024-01-16 DIAGNOSIS — G3184 Mild cognitive impairment, so stated: Secondary | ICD-10-CM | POA: Diagnosis not present

## 2024-01-16 DIAGNOSIS — I69351 Hemiplegia and hemiparesis following cerebral infarction affecting right dominant side: Secondary | ICD-10-CM | POA: Diagnosis not present

## 2024-01-16 DIAGNOSIS — N319 Neuromuscular dysfunction of bladder, unspecified: Secondary | ICD-10-CM | POA: Diagnosis not present

## 2024-01-16 DIAGNOSIS — N390 Urinary tract infection, site not specified: Secondary | ICD-10-CM | POA: Diagnosis not present

## 2024-01-16 DIAGNOSIS — I10 Essential (primary) hypertension: Secondary | ICD-10-CM | POA: Diagnosis not present

## 2024-01-16 DIAGNOSIS — I2510 Atherosclerotic heart disease of native coronary artery without angina pectoris: Secondary | ICD-10-CM | POA: Diagnosis not present

## 2024-01-16 DIAGNOSIS — G9349 Other encephalopathy: Secondary | ICD-10-CM | POA: Diagnosis not present

## 2024-01-16 DIAGNOSIS — I69322 Dysarthria following cerebral infarction: Secondary | ICD-10-CM | POA: Diagnosis not present

## 2024-01-16 LAB — POCT CBG (FASTING - GLUCOSE)-MANUAL ENTRY: Glucose Fasting, POC: 105 mg/dL — AB (ref 70–99)

## 2024-01-16 NOTE — Addendum Note (Signed)
 Addended by: BRYCE MARKER AHMAD on: 01/16/2024 03:44 PM   Modules accepted: Orders

## 2024-01-16 NOTE — Progress Notes (Signed)
 Established Patient Office Visit  Subjective:  Patient ID: Edward Thompson, male    DOB: 1951-02-15  Age: 73 y.o. MRN: [id number]  Chief Complaint  Patient presents with   Follow-up    Hospital follow up    Hospital f/u for acute stroke about 2 wks ago, discharged home with home health nursing and rehab. Strength and speech have improved since discharge.    No other concerns at this time.   Past Medical History:  Diagnosis Date   CAD (coronary artery disease)    Carotid stenosis, left 08/2017   Diabetes mellitus without complication (HCC)    Foot drop    Fracture of neck (HCC) 2008   fell off a roof. required halo x 4 months. also fractured alot of vertebrae   Heart attack (HCC) 2015   Heart disease    Hepatitis    6th grade    Hyperlipidemia    Hypertension    Myocardial infarction acute (HCC) 2015   Seizures (HCC)    taking phenobarbitol and dilantin . LAST SEIZURE WAS 2016. well controlled on meds   Sleep apnea    USES CPAP   Syncope 2019   Vertigo    Viral meningitis     Past Surgical History:  Procedure Laterality Date   CARDIAC SURGERY     COLONOSCOPY     COLONOSCOPY WITH PROPOFOL  N/A 03/11/2021   Procedure: COLONOSCOPY WITH PROPOFOL ;  Surgeon: Toledo, Ladell POUR, MD;  Location: [facility] ENDOSCOPY;  Service: Gastroenterology;  Laterality: N/A;   CORONARY ANGIOPLASTY     PATIENT UNAWARE OF THIS   CORONARY ARTERY BYPASS GRAFT  2015   ENDARTERECTOMY Left 08/19/2017   Procedure: ENDARTERECTOMY CAROTID;  Surgeon: Jama Cordella MATSU, MD;  Location: [facility] ORS;  Service: Vascular;  Laterality: Left;   ESOPHAGOGASTRODUODENOSCOPY N/A 03/11/2021   Procedure: ESOPHAGOGASTRODUODENOSCOPY (EGD);  Surgeon: Toledo, Ladell POUR, MD;  Location: [facility] ENDOSCOPY;  Service: Gastroenterology;  Laterality: N/A;  DM   HERNIA REPAIR Right 1985   inguinal    Social History   Socioeconomic History   Marital status: Widowed    Spouse name: Not on file   Number of children: Not on file    Years of education: Not on file   Highest education level: Not on file  Occupational History   Not on file  Tobacco Use   Smoking status: Never   Smokeless tobacco: Never  Vaping Use   Vaping status: Never Used  Substance and Sexual Activity   Alcohol use: No   Drug use: No   Sexual activity: Not Currently  Other Topics Concern   Not on file  Social History Narrative   Not on file   Social Drivers of Health   Financial Resource Strain: Low Risk  (02/12/2023)   Received from [facility]   Overall Financial Resource Strain (CARDIA)    Difficulty of Paying Living Expenses: Not very hard  Food Insecurity: No Food Insecurity (01/01/2024)   Hunger Vital Sign    Worried About Running Out of Food in the Last Year: Never true    Ran Out of Food in the Last Year: Never true  Transportation Needs: No Transportation Needs (01/01/2024)   PRAPARE - Administrator, Civil Service (Medical): No    Lack of Transportation (Non-Medical): No  Physical Activity: Not on file  Stress: Not on file  Social Connections: Unknown (01/03/2024)   Social Connection and Isolation Panel    Frequency of Communication with Friends and Family:  Not on file    Frequency of Social Gatherings with Friends and Family: Not on file    Attends Religious Services: More than 4 times per year    Active Member of Clubs or Organizations: Not on file    Attends Banker Meetings: Not on file    Marital Status: Not on file  Intimate Partner Violence: Unknown (01/01/2024)   Humiliation, Afraid, Rape, and Kick questionnaire    Fear of Current or Ex-Partner: Patient declined    Emotionally Abused: Patient declined    Physically Abused: Not on file    Sexually Abused: Patient declined    Family History  Problem Relation Age of Onset   Heart disease Mother    Heart attack Father     No Known Allergies  Outpatient Medications Prior to Visit  Medication Sig   Accu-Chek Softclix Lancets  lancets TEST BLOOD SUGAR THREE TIMES DAILY   acetaminophen  (TYLENOL ) 500 MG tablet Take 500 mg by mouth every 6 (six) hours as needed.   aspirin  EC 81 MG tablet Take 81 mg by mouth at bedtime.   Blood Glucose Monitoring Suppl (ACCU-CHEK GUIDE) w/Device KIT    clopidogrel  (PLAVIX ) 75 MG tablet TAKE 1 TABLET EVERY DAY   furosemide  (LASIX ) 20 MG tablet Take 1 tablet by mouth once daily   glucose blood (ACCU-CHEK GUIDE) test strip USE WITH DIABETIC METER TO TEST BLOOD SUGAR THREE TIMES DAILY   isosorbide  mononitrate (IMDUR ) 30 MG 24 hr tablet TAKE 1 TABLET EVERY DAY   meclizine  (ANTIVERT ) 25 MG tablet Take 1 tablet (25 mg total) by mouth in the morning, at noon, in the evening, and at bedtime. TAKE 1 TABLET FOUR TIMES DAILY   metFORMIN  (GLUCOPHAGE ) 500 MG tablet Take 1 tablet (500 mg total) by mouth every morning.   metoprolol  succinate (TOPROL -XL) 25 MG 24 hr tablet Take 0.5 tablets (12.5 mg total) by mouth daily.   nitroGLYCERIN  (NITROSTAT ) 0.4 MG SL tablet Place 0.4 mg under the tongue every 5 (five) minutes as needed for chest pain.   pantoprazole  (PROTONIX ) 40 MG tablet Take 1 tablet (40 mg total) by mouth every morning.   PHENobarbital  (LUMINAL) 97.2 MG tablet Take 1 tablet (97.2 mg total) by mouth at bedtime.   phenytoin  (DILANTIN ) 200 MG ER capsule Take 1 capsule (200 mg total) by mouth daily. Take with 1 capsule 30 mg dose for total daily dose 230 mg   phenytoin  (DILANTIN ) 30 MG ER capsule Take 1 capsule (30 mg total) by mouth daily. Take with 1 capsule 200 mg dose for total daily dose 230 mg   rosuvastatin  (CRESTOR ) 40 MG tablet Take 1 tablet (40 mg total) by mouth at bedtime.   senna-docusate (SENOKOT-Kadi Hession) 8.6-50 MG tablet Take 1 tablet by mouth at bedtime.   No facility-administered medications prior to visit.    Review of Systems  Constitutional: Negative.   HENT: Negative.    Eyes: Negative.   Respiratory: Negative.    Gastrointestinal: Negative.   Genitourinary:  Positive for  urgency.  Musculoskeletal: Negative.   Skin: Negative.   Neurological:  Positive for speech change and focal weakness.  Endo/Heme/Allergies: Negative.   Psychiatric/Behavioral: Negative.    All other systems reviewed and are negative.      Objective:   BP 120/68   Pulse 81   Ht 5' 4 (1.626 m)   Wt 195 lb (88.5 kg)   SpO2 95%   BMI 33.47 kg/m   Vitals:   01/16/24 1516  BP:  120/68  Pulse: 81  Height: 5' 4 (1.626 m)  Weight: 195 lb (88.5 kg)  SpO2: 95%  BMI (Calculated): 33.46    Physical Exam Vitals reviewed.  Constitutional:      Appearance: Normal appearance. He is normal weight.  HENT:     Head: Normocephalic.     Nose: Nose normal.     Mouth/Throat:     Mouth: Mucous membranes are moist.  Eyes:     Pupils: Pupils are equal, round, and reactive to light.  Cardiovascular:     Rate and Rhythm: Normal rate and regular rhythm.     Pulses: Normal pulses.     Heart sounds: Normal heart sounds.  Pulmonary:     Effort: Pulmonary effort is normal.  Abdominal:     General: Abdomen is flat. Bowel sounds are normal.  Musculoskeletal:        General: Normal range of motion.     Cervical back: Normal range of motion.  Skin:    General: Skin is warm.  Neurological:     General: No focal deficit present.     Mental Status: He is alert.     Motor: Weakness (right arm 4/5) present.     Deep Tendon Reflexes:     Reflex Scores:      Tricep reflexes are 1+ on the right side and 1+ on the left side.      Bicep reflexes are 1+ on the right side and 1+ on the left side.      Brachioradialis reflexes are 1+ on the right side and 1+ on the left side.      Patellar reflexes are 1+ on the right side and 1+ on the left side. Psychiatric:        Mood and Affect: Mood normal.      Results for orders placed or performed in visit on 01/16/24  POCT CBG (Fasting - Glucose)  Result Value Ref Range   Glucose Fasting, POC 105 (A) 70 - 99 mg/dL        Assessment & Plan:   Edward Thompson was seen today for follow-up.  Diabetes mellitus without complication (HCC) -     POCT CBG (Fasting - Glucose)  Cerebrovascular accident (CVA) due to other mechanism [facility])    Problem List Items Addressed This Visit       Cardiovascular and Mediastinum   Stroke [facility])   Other Visit Diagnoses       Diabetes mellitus without complication (HCC)    -  Primary   Relevant Orders   POCT CBG (Fasting - Glucose) (Completed)       Return if symptoms worsen or fail to improve.   Total time spent: 30 minutes  Sherrill Cinderella Perry, MD  01/16/2024   This document may have been prepared by [facility] Voice Recognition software and as such may include unintentional dictation errors.

## 2024-01-18 ENCOUNTER — Telehealth: Payer: Self-pay

## 2024-01-18 DIAGNOSIS — I69391 Dysphagia following cerebral infarction: Secondary | ICD-10-CM | POA: Diagnosis not present

## 2024-01-18 DIAGNOSIS — I10 Essential (primary) hypertension: Secondary | ICD-10-CM | POA: Diagnosis not present

## 2024-01-18 DIAGNOSIS — G3184 Mild cognitive impairment, so stated: Secondary | ICD-10-CM | POA: Diagnosis not present

## 2024-01-18 DIAGNOSIS — I2510 Atherosclerotic heart disease of native coronary artery without angina pectoris: Secondary | ICD-10-CM | POA: Diagnosis not present

## 2024-01-18 DIAGNOSIS — N390 Urinary tract infection, site not specified: Secondary | ICD-10-CM | POA: Diagnosis not present

## 2024-01-18 DIAGNOSIS — E119 Type 2 diabetes mellitus without complications: Secondary | ICD-10-CM | POA: Diagnosis not present

## 2024-01-18 DIAGNOSIS — G9349 Other encephalopathy: Secondary | ICD-10-CM | POA: Diagnosis not present

## 2024-01-18 DIAGNOSIS — I69322 Dysarthria following cerebral infarction: Secondary | ICD-10-CM | POA: Diagnosis not present

## 2024-01-18 DIAGNOSIS — I69351 Hemiplegia and hemiparesis following cerebral infarction affecting right dominant side: Secondary | ICD-10-CM | POA: Diagnosis not present

## 2024-01-18 NOTE — Telephone Encounter (Signed)
 [facility] called because pt has an elevated temperature of 99.8. Concerned about pt as he self catheters and has frequent UTI. Home health was also worried about possible aspirating. Homehealth is requesting a nurse.

## 2024-01-20 NOTE — Telephone Encounter (Signed)
 Edward Thompson informed - ao

## 2024-01-23 DIAGNOSIS — I69391 Dysphagia following cerebral infarction: Secondary | ICD-10-CM | POA: Diagnosis not present

## 2024-01-23 DIAGNOSIS — I69322 Dysarthria following cerebral infarction: Secondary | ICD-10-CM | POA: Diagnosis not present

## 2024-01-23 DIAGNOSIS — I69351 Hemiplegia and hemiparesis following cerebral infarction affecting right dominant side: Secondary | ICD-10-CM | POA: Diagnosis not present

## 2024-01-23 DIAGNOSIS — I10 Essential (primary) hypertension: Secondary | ICD-10-CM | POA: Diagnosis not present

## 2024-01-23 DIAGNOSIS — I2510 Atherosclerotic heart disease of native coronary artery without angina pectoris: Secondary | ICD-10-CM | POA: Diagnosis not present

## 2024-01-23 DIAGNOSIS — G3184 Mild cognitive impairment, so stated: Secondary | ICD-10-CM | POA: Diagnosis not present

## 2024-01-23 DIAGNOSIS — E119 Type 2 diabetes mellitus without complications: Secondary | ICD-10-CM | POA: Diagnosis not present

## 2024-01-23 DIAGNOSIS — N390 Urinary tract infection, site not specified: Secondary | ICD-10-CM | POA: Diagnosis not present

## 2024-01-23 DIAGNOSIS — G9349 Other encephalopathy: Secondary | ICD-10-CM | POA: Diagnosis not present

## 2024-01-27 ENCOUNTER — Other Ambulatory Visit

## 2024-01-27 DIAGNOSIS — I69351 Hemiplegia and hemiparesis following cerebral infarction affecting right dominant side: Secondary | ICD-10-CM | POA: Diagnosis not present

## 2024-01-27 DIAGNOSIS — I69322 Dysarthria following cerebral infarction: Secondary | ICD-10-CM | POA: Diagnosis not present

## 2024-01-27 DIAGNOSIS — N401 Enlarged prostate with lower urinary tract symptoms: Secondary | ICD-10-CM | POA: Diagnosis not present

## 2024-01-27 DIAGNOSIS — I69391 Dysphagia following cerebral infarction: Secondary | ICD-10-CM | POA: Diagnosis not present

## 2024-01-27 DIAGNOSIS — N390 Urinary tract infection, site not specified: Secondary | ICD-10-CM | POA: Diagnosis not present

## 2024-01-27 DIAGNOSIS — G3184 Mild cognitive impairment, so stated: Secondary | ICD-10-CM | POA: Diagnosis not present

## 2024-01-27 DIAGNOSIS — R350 Frequency of micturition: Secondary | ICD-10-CM | POA: Diagnosis not present

## 2024-01-27 DIAGNOSIS — G9349 Other encephalopathy: Secondary | ICD-10-CM | POA: Diagnosis not present

## 2024-01-27 DIAGNOSIS — I2510 Atherosclerotic heart disease of native coronary artery without angina pectoris: Secondary | ICD-10-CM | POA: Diagnosis not present

## 2024-01-27 DIAGNOSIS — I10 Essential (primary) hypertension: Secondary | ICD-10-CM | POA: Diagnosis not present

## 2024-01-27 DIAGNOSIS — E119 Type 2 diabetes mellitus without complications: Secondary | ICD-10-CM | POA: Diagnosis not present

## 2024-01-28 LAB — COMPREHENSIVE METABOLIC PANEL WITH GFR
ALT: 12 IU/L (ref 0–44)
AST: 13 IU/L (ref 0–40)
Albumin: 4.2 g/dL (ref 3.8–4.8)
Alkaline Phosphatase: 92 IU/L (ref 47–123)
BUN/Creatinine Ratio: 18 (ref 10–24)
BUN: 12 mg/dL (ref 8–27)
Bilirubin Total: 0.3 mg/dL (ref 0.0–1.2)
CO2: 20 mmol/L (ref 20–29)
Calcium: 9.2 mg/dL (ref 8.6–10.2)
Chloride: 98 mmol/L (ref 96–106)
Creatinine, Ser: 0.67 mg/dL — ABNORMAL LOW (ref 0.76–1.27)
Globulin, Total: 2.6 g/dL (ref 1.5–4.5)
Glucose: 82 mg/dL (ref 70–99)
Potassium: 4.2 mmol/L (ref 3.5–5.2)
Sodium: 134 mmol/L (ref 134–144)
Total Protein: 6.8 g/dL (ref 6.0–8.5)
eGFR: 99 mL/min/1.73 (ref >59)

## 2024-01-28 LAB — CBC WITH DIFF/PLATELET
Basophils Absolute: 0 x10E3/uL (ref 0.0–0.2)
Basos: 1 %
EOS (ABSOLUTE): 0.2 x10E3/uL (ref 0.0–0.4)
Eos: 3 %
Hematocrit: 37.3 % — ABNORMAL LOW (ref 37.5–51.0)
Hemoglobin: 12.2 g/dL — ABNORMAL LOW (ref 13.0–17.7)
Immature Grans (Abs): 0 x10E3/uL (ref 0.0–0.1)
Immature Granulocytes: 0 %
Lymphocytes Absolute: 1.9 x10E3/uL (ref 0.7–3.1)
Lymphs: 31 %
MCH: 30.1 pg (ref 26.6–33.0)
MCHC: 32.7 g/dL (ref 31.5–35.7)
MCV: 92 fL (ref 79–97)
Monocytes Absolute: 0.8 x10E3/uL (ref 0.1–0.9)
Monocytes: 12 %
Neutrophils Absolute: 3.2 x10E3/uL (ref 1.4–7.0)
Neutrophils: 52 %
Platelets: 190 x10E3/uL (ref 150–450)
RBC: 4.05 x10E6/uL — ABNORMAL LOW (ref 4.14–5.80)
RDW: 13.9 % (ref 11.6–15.4)
WBC: 6.1 x10E3/uL (ref 3.4–10.8)

## 2024-01-28 LAB — PSA: Prostate Specific Ag, Serum: 2.6 ng/mL (ref 0.0–4.0)

## 2024-01-30 DIAGNOSIS — G9349 Other encephalopathy: Secondary | ICD-10-CM | POA: Diagnosis not present

## 2024-01-30 DIAGNOSIS — I69322 Dysarthria following cerebral infarction: Secondary | ICD-10-CM | POA: Diagnosis not present

## 2024-01-30 DIAGNOSIS — I69351 Hemiplegia and hemiparesis following cerebral infarction affecting right dominant side: Secondary | ICD-10-CM | POA: Diagnosis not present

## 2024-01-30 DIAGNOSIS — I69391 Dysphagia following cerebral infarction: Secondary | ICD-10-CM | POA: Diagnosis not present

## 2024-01-30 DIAGNOSIS — I2510 Atherosclerotic heart disease of native coronary artery without angina pectoris: Secondary | ICD-10-CM | POA: Diagnosis not present

## 2024-01-30 DIAGNOSIS — G3184 Mild cognitive impairment, so stated: Secondary | ICD-10-CM | POA: Diagnosis not present

## 2024-01-30 DIAGNOSIS — E119 Type 2 diabetes mellitus without complications: Secondary | ICD-10-CM | POA: Diagnosis not present

## 2024-01-30 DIAGNOSIS — I10 Essential (primary) hypertension: Secondary | ICD-10-CM | POA: Diagnosis not present

## 2024-01-30 DIAGNOSIS — N390 Urinary tract infection, site not specified: Secondary | ICD-10-CM | POA: Diagnosis not present

## 2024-01-31 ENCOUNTER — Ambulatory Visit (INDEPENDENT_AMBULATORY_CARE_PROVIDER_SITE_OTHER): Admitting: Internal Medicine

## 2024-01-31 ENCOUNTER — Ambulatory Visit: Payer: Self-pay | Admitting: Internal Medicine

## 2024-01-31 VITALS — BP 116/62 | HR 78 | Temp 97.9°F | Ht 64.0 in | Wt 195.0 lb

## 2024-01-31 DIAGNOSIS — Z739 Problem related to life management difficulty, unspecified: Secondary | ICD-10-CM | POA: Diagnosis not present

## 2024-01-31 DIAGNOSIS — Z1331 Encounter for screening for depression: Secondary | ICD-10-CM | POA: Diagnosis not present

## 2024-01-31 DIAGNOSIS — Z0130 Encounter for examination of blood pressure without abnormal findings: Secondary | ICD-10-CM | POA: Diagnosis not present

## 2024-01-31 DIAGNOSIS — N401 Enlarged prostate with lower urinary tract symptoms: Secondary | ICD-10-CM | POA: Diagnosis not present

## 2024-01-31 DIAGNOSIS — Z1389 Encounter for screening for other disorder: Secondary | ICD-10-CM

## 2024-01-31 DIAGNOSIS — R350 Frequency of micturition: Secondary | ICD-10-CM

## 2024-01-31 DIAGNOSIS — Z0001 Encounter for general adult medical examination with abnormal findings: Secondary | ICD-10-CM

## 2024-01-31 DIAGNOSIS — E119 Type 2 diabetes mellitus without complications: Secondary | ICD-10-CM | POA: Diagnosis not present

## 2024-01-31 LAB — POCT URINALYSIS DIPSTICK
Bilirubin, UA: NEGATIVE
Blood, UA: NEGATIVE
Glucose, UA: NEGATIVE
Ketones, UA: NEGATIVE
Nitrite, UA: NEGATIVE
Protein, UA: NEGATIVE
Spec Grav, UA: 1.01 (ref 1.010–1.025)
Urobilinogen, UA: 0.2 U/dL
pH, UA: 6 (ref 5.0–8.0)

## 2024-01-31 LAB — POCT CBG (FASTING - GLUCOSE)-MANUAL ENTRY: Glucose Fasting, POC: 76 mg/dL (ref 70–99)

## 2024-01-31 NOTE — Progress Notes (Signed)
 Established Patient Office Visit  Subjective:  Patient ID: Edward Thompson, male    DOB: 10-Jun-1950  Age: 73 y.o. MRN: [id number]  Chief Complaint  Patient presents with   Annual Exam    3 month AWV lab results     No new complaints, here for AWV refer to quality metrics and scanned documents. Established bowel regularity with Miralax . Labs reviewed and notable for improvement in hgb and stable cmp.     No other concerns at this time.   Past Medical History:  Diagnosis Date   CAD (coronary artery disease)    Carotid stenosis, left 08/2017   Diabetes mellitus without complication (HCC)    Foot drop    Fracture of neck (HCC) 2008   fell off a roof. required halo x 4 months. also fractured alot of vertebrae   Heart attack (HCC) 2015   Heart disease    Hepatitis    6th grade    Hyperlipidemia    Hypertension    Myocardial infarction acute (HCC) 2015   Seizures (HCC)    taking phenobarbitol and dilantin . LAST SEIZURE WAS 2016. well controlled on meds   Sleep apnea    USES CPAP   Syncope 2019   Vertigo    Viral meningitis     Past Surgical History:  Procedure Laterality Date   CARDIAC SURGERY     COLONOSCOPY     COLONOSCOPY WITH PROPOFOL  N/A 03/11/2021   Procedure: COLONOSCOPY WITH PROPOFOL ;  Surgeon: Toledo, Ladell POUR, MD;  Location: [facility];  Service: Gastroenterology;  Laterality: N/A;   CORONARY ANGIOPLASTY     PATIENT UNAWARE OF THIS   CORONARY ARTERY BYPASS GRAFT  2015   ENDARTERECTOMY Left 08/19/2017   Procedure: ENDARTERECTOMY CAROTID;  Surgeon: Jama Cordella MATSU, MD;  Location: [facility];  Service: Vascular;  Laterality: Left;   ESOPHAGOGASTRODUODENOSCOPY N/A 03/11/2021   Procedure: ESOPHAGOGASTRODUODENOSCOPY (EGD);  Surgeon: Toledo, Ladell POUR, MD;  Location: [facility];  Service: Gastroenterology;  Laterality: N/A;  DM   HERNIA REPAIR Right 1985   inguinal    Social History   Socioeconomic History   Marital status: Widowed    Spouse name:  Not on file   Number of children: Not on file   Years of education: Not on file   Highest education level: Not on file  Occupational History   Not on file  Tobacco Use   Smoking status: Never   Smokeless tobacco: Never  Vaping Use   Vaping status: Never Used  Substance and Sexual Activity   Alcohol use: No   Drug use: No   Sexual activity: Not Currently  Other Topics Concern   Not on file  Social History Narrative   Not on file   Social Drivers of Health   Financial Resource Strain: Low Risk  (02/12/2023)   Received from [facility]   Overall Financial Resource Strain (CARDIA)    Difficulty of Paying Living Expenses: Not very hard  Food Insecurity: No Food Insecurity (01/01/2024)   Hunger Vital Sign    Worried About Running Out of Food in the Last Year: Never true    Ran Out of Food in the Last Year: Never true  Transportation Needs: No Transportation Needs (01/01/2024)   PRAPARE - Administrator, Civil Service (Medical): No    Lack of Transportation (Non-Medical): No  Physical Activity: Not on file  Stress: Not on file  Social Connections: Unknown (01/03/2024)   Social Connection and Isolation Panel  Frequency of Communication with Friends and Family: Not on file    Frequency of Social Gatherings with Friends and Family: Not on file    Attends Religious Services: More than 4 times per year    Active Member of [employer] or Organizations: Not on file    Attends Banker Meetings: Not on file    Marital Status: Not on file  Intimate Partner Violence: Unknown (01/01/2024)   Humiliation, Afraid, Rape, and Kick questionnaire    Fear of Current or Ex-Partner: Patient declined    Emotionally Abused: Patient declined    Physically Abused: Not on file    Sexually Abused: Patient declined    Family History  Problem Relation Age of Onset   Heart disease Mother    Heart attack Father     No Known Allergies  Outpatient Medications Prior to Visit   Medication Sig   Accu-Chek Softclix Lancets lancets TEST BLOOD SUGAR THREE TIMES DAILY   acetaminophen  (TYLENOL ) 500 MG tablet Take 500 mg by mouth every 6 (six) hours as needed.   aspirin  EC 81 MG tablet Take 81 mg by mouth at bedtime.   Blood Glucose Monitoring Suppl (ACCU-CHEK GUIDE) w/Device KIT    clopidogrel  (PLAVIX ) 75 MG tablet TAKE 1 TABLET EVERY DAY   furosemide  (LASIX ) 20 MG tablet Take 1 tablet by mouth once daily   glucose blood (ACCU-CHEK GUIDE) test strip USE WITH DIABETIC METER TO TEST BLOOD SUGAR THREE TIMES DAILY   isosorbide  mononitrate (IMDUR ) 30 MG 24 hr tablet TAKE 1 TABLET EVERY DAY   meclizine  (ANTIVERT ) 25 MG tablet Take 1 tablet (25 mg total) by mouth in the morning, at noon, in the evening, and at bedtime. TAKE 1 TABLET FOUR TIMES DAILY   metFORMIN  (GLUCOPHAGE ) 500 MG tablet Take 1 tablet (500 mg total) by mouth every morning.   metoprolol  succinate (TOPROL -XL) 25 MG 24 hr tablet Take 0.5 tablets (12.5 mg total) by mouth daily.   nitroGLYCERIN  (NITROSTAT ) 0.4 MG SL tablet Place 0.4 mg under the tongue every 5 (five) minutes as needed for chest pain.   pantoprazole  (PROTONIX ) 40 MG tablet Take 1 tablet (40 mg total) by mouth every morning.   PHENobarbital  (LUMINAL) 97.2 MG tablet Take 1 tablet (97.2 mg total) by mouth at bedtime.   phenytoin  (DILANTIN ) 200 MG ER capsule Take 1 capsule (200 mg total) by mouth daily. Take with 1 capsule 30 mg dose for total daily dose 230 mg   phenytoin  (DILANTIN ) 30 MG ER capsule Take 1 capsule (30 mg total) by mouth daily. Take with 1 capsule 200 mg dose for total daily dose 230 mg   rosuvastatin  (CRESTOR ) 40 MG tablet Take 1 tablet (40 mg total) by mouth at bedtime.   senna-docusate (SENOKOT-Julieann Drummonds) 8.6-50 MG tablet Take 1 tablet by mouth at bedtime. (Patient not taking: Reported on 01/31/2024)   No facility-administered medications prior to visit.    Review of Systems  Constitutional: Negative.   HENT: Negative.    Eyes: Negative.    Respiratory: Negative.    Gastrointestinal: Negative.   Genitourinary:  Positive for urgency.  Musculoskeletal: Negative.   Skin: Negative.   Neurological:  Positive for speech change and focal weakness.  Endo/Heme/Allergies: Negative.   Psychiatric/Behavioral: Negative.    All other systems reviewed and are negative.      Objective:   BP 116/62   Pulse 78   Temp 97.9 F (36.6 C)   Ht 5' 4 (1.626 m)   Wt 195 lb (  88.5 kg)   SpO2 98%   BMI 33.47 kg/m   Vitals:   01/31/24 1331  BP: 116/62  Pulse: 78  Temp: 97.9 F (36.6 C)  Height: 5' 4 (1.626 m)  Weight: 195 lb (88.5 kg)  SpO2: 98%  BMI (Calculated): 33.46    Physical Exam Vitals reviewed.  Constitutional:      Appearance: Normal appearance. He is normal weight.  HENT:     Head: Normocephalic.     Nose: Nose normal.     Mouth/Throat:     Mouth: Mucous membranes are moist.  Eyes:     Pupils: Pupils are equal, round, and reactive to light.  Cardiovascular:     Rate and Rhythm: Normal rate and regular rhythm.     Pulses: Normal pulses.     Heart sounds: Normal heart sounds.  Pulmonary:     Effort: Pulmonary effort is normal.  Abdominal:     General: Abdomen is flat. Bowel sounds are normal.  Musculoskeletal:        General: Normal range of motion.     Cervical back: Normal range of motion.  Skin:    General: Skin is warm.  Neurological:     General: No focal deficit present.     Mental Status: He is alert.     Motor: Weakness (right arm 4/5) present.     Deep Tendon Reflexes:     Reflex Scores:      Tricep reflexes are 1+ on the right side and 1+ on the left side.      Bicep reflexes are 1+ on the right side and 1+ on the left side.      Brachioradialis reflexes are 1+ on the right side and 1+ on the left side.      Patellar reflexes are 1+ on the right side and 1+ on the left side. Psychiatric:        Mood and Affect: Mood normal.      Results for orders placed or performed in visit on  01/31/24  POCT CBG (Fasting - Glucose)  Result Value Ref Range   Glucose Fasting, POC 76 70 - 99 mg/dL    CMP     Component Value Date/Time   NA 134 01/27/2024 0831   NA 139 09/22/2013 1832   K 4.2 01/27/2024 0831   K 3.8 09/22/2013 1832   CL 98 01/27/2024 0831   CL 106 09/22/2013 1832   CO2 20 01/27/2024 0831   CO2 25 09/22/2013 1832   GLUCOSE 82 01/27/2024 0831   GLUCOSE 110 (H) 01/04/2024 0331   GLUCOSE 162 (H) 09/22/2013 1832   BUN 12 01/27/2024 0831   BUN 16 09/22/2013 1832   CREATININE 0.67 (L) 01/27/2024 0831   CREATININE 0.91 09/22/2013 1832   CALCIUM  9.2 01/27/2024 0831   CALCIUM  8.5 09/22/2013 1832   PROT 6.8 01/27/2024 0831   PROT 7.3 09/22/2013 1832   ALBUMIN 4.2 01/27/2024 0831   ALBUMIN 3.6 09/22/2013 1832   AST 13 01/27/2024 0831   AST 29 09/22/2013 1832   ALT 12 01/27/2024 0831   ALT 32 09/22/2013 1832   ALKPHOS 92 01/27/2024 0831   ALKPHOS 101 09/22/2013 1832   BILITOT 0.3 01/27/2024 0831   BILITOT 0.2 09/22/2013 1832   EGFR 99 01/27/2024 0831   GFRNONAA >60 01/04/2024 0331   GFRNONAA >60 09/22/2013 1832   CBC    Component Value Date/Time   WBC 6.1 01/27/2024 0831   WBC 8.3 01/04/2024 0331  RBC 4.05 (L) 01/27/2024 0831   RBC 3.93 (L) 01/04/2024 0331   HGB 12.2 (L) 01/27/2024 0831   HCT 37.3 (L) 01/27/2024 0831   PLT 190 01/27/2024 0831   MCV 92 01/27/2024 0831   MCV 91 09/22/2013 1832   MCH 30.1 01/27/2024 0831   MCH 29.0 01/04/2024 0331   MCHC 32.7 01/27/2024 0831   MCHC 32.9 01/04/2024 0331   RDW 13.9 01/27/2024 0831   RDW 14.5 09/22/2013 1832   LYMPHSABS 1.9 01/27/2024 0831   LYMPHSABS 2.3 08/20/2013 0422   MONOABS 0.8 01/01/2024 1321   MONOABS 0.7 08/20/2013 0422   EOSABS 0.2 01/27/2024 0831   EOSABS 0.4 08/20/2013 0422   BASOSABS 0.0 01/27/2024 0831   BASOSABS 0.0 08/20/2013 0422       Assessment & Plan:  Calyx was seen today for annual exam.  Diabetes mellitus without complication (HCC) -     POCT CBG (Fasting -  Glucose)    Problem List Items Addressed This Visit   None Visit Diagnoses       Diabetes mellitus without complication (HCC)    -  Primary   Relevant Orders   POCT CBG (Fasting - Glucose) (Completed)       No follow-ups on file.   Total time spent: 35 minutes  Sherrill Cinderella Perry, MD  01/31/2024   This document may have been prepared by [facility] Voice Recognition software and as such may include unintentional dictation errors.

## 2024-02-01 DIAGNOSIS — I69391 Dysphagia following cerebral infarction: Secondary | ICD-10-CM | POA: Diagnosis not present

## 2024-02-01 DIAGNOSIS — G3184 Mild cognitive impairment, so stated: Secondary | ICD-10-CM | POA: Diagnosis not present

## 2024-02-01 DIAGNOSIS — I69322 Dysarthria following cerebral infarction: Secondary | ICD-10-CM | POA: Diagnosis not present

## 2024-02-01 DIAGNOSIS — N390 Urinary tract infection, site not specified: Secondary | ICD-10-CM | POA: Diagnosis not present

## 2024-02-01 DIAGNOSIS — I10 Essential (primary) hypertension: Secondary | ICD-10-CM | POA: Diagnosis not present

## 2024-02-01 DIAGNOSIS — I69351 Hemiplegia and hemiparesis following cerebral infarction affecting right dominant side: Secondary | ICD-10-CM | POA: Diagnosis not present

## 2024-02-01 DIAGNOSIS — G9349 Other encephalopathy: Secondary | ICD-10-CM | POA: Diagnosis not present

## 2024-02-01 DIAGNOSIS — E119 Type 2 diabetes mellitus without complications: Secondary | ICD-10-CM | POA: Diagnosis not present

## 2024-02-01 DIAGNOSIS — I2510 Atherosclerotic heart disease of native coronary artery without angina pectoris: Secondary | ICD-10-CM | POA: Diagnosis not present

## 2024-02-01 NOTE — Progress Notes (Signed)
Pt informed

## 2024-02-04 ENCOUNTER — Other Ambulatory Visit: Payer: Self-pay | Admitting: Cardiovascular Disease

## 2024-02-06 DIAGNOSIS — I69391 Dysphagia following cerebral infarction: Secondary | ICD-10-CM | POA: Diagnosis not present

## 2024-02-06 DIAGNOSIS — I69351 Hemiplegia and hemiparesis following cerebral infarction affecting right dominant side: Secondary | ICD-10-CM | POA: Diagnosis not present

## 2024-02-06 DIAGNOSIS — E119 Type 2 diabetes mellitus without complications: Secondary | ICD-10-CM | POA: Diagnosis not present

## 2024-02-06 DIAGNOSIS — G3184 Mild cognitive impairment, so stated: Secondary | ICD-10-CM | POA: Diagnosis not present

## 2024-02-06 DIAGNOSIS — I10 Essential (primary) hypertension: Secondary | ICD-10-CM | POA: Diagnosis not present

## 2024-02-06 DIAGNOSIS — N390 Urinary tract infection, site not specified: Secondary | ICD-10-CM | POA: Diagnosis not present

## 2024-02-06 DIAGNOSIS — I2510 Atherosclerotic heart disease of native coronary artery without angina pectoris: Secondary | ICD-10-CM | POA: Diagnosis not present

## 2024-02-06 DIAGNOSIS — I69322 Dysarthria following cerebral infarction: Secondary | ICD-10-CM | POA: Diagnosis not present

## 2024-02-06 DIAGNOSIS — G9349 Other encephalopathy: Secondary | ICD-10-CM | POA: Diagnosis not present

## 2024-02-07 DIAGNOSIS — I2510 Atherosclerotic heart disease of native coronary artery without angina pectoris: Secondary | ICD-10-CM | POA: Diagnosis not present

## 2024-02-07 DIAGNOSIS — I69351 Hemiplegia and hemiparesis following cerebral infarction affecting right dominant side: Secondary | ICD-10-CM | POA: Diagnosis not present

## 2024-02-07 DIAGNOSIS — I69322 Dysarthria following cerebral infarction: Secondary | ICD-10-CM | POA: Diagnosis not present

## 2024-02-07 DIAGNOSIS — E119 Type 2 diabetes mellitus without complications: Secondary | ICD-10-CM | POA: Diagnosis not present

## 2024-02-07 DIAGNOSIS — I69391 Dysphagia following cerebral infarction: Secondary | ICD-10-CM | POA: Diagnosis not present

## 2024-02-07 DIAGNOSIS — N390 Urinary tract infection, site not specified: Secondary | ICD-10-CM | POA: Diagnosis not present

## 2024-02-07 DIAGNOSIS — G3184 Mild cognitive impairment, so stated: Secondary | ICD-10-CM | POA: Diagnosis not present

## 2024-02-07 DIAGNOSIS — G9349 Other encephalopathy: Secondary | ICD-10-CM | POA: Diagnosis not present

## 2024-02-07 DIAGNOSIS — I10 Essential (primary) hypertension: Secondary | ICD-10-CM | POA: Diagnosis not present

## 2024-02-09 ENCOUNTER — Other Ambulatory Visit: Payer: Self-pay | Admitting: Cardiology

## 2024-02-12 ENCOUNTER — Other Ambulatory Visit: Payer: Self-pay

## 2024-02-12 ENCOUNTER — Emergency Department
Admission: EM | Admit: 2024-02-12 | Discharge: 2024-02-12 | Disposition: A | Discharge status: Home / Self Care | Attending: Emergency Medicine | Admitting: Emergency Medicine

## 2024-02-12 ENCOUNTER — Encounter: Payer: Self-pay | Admitting: Emergency Medicine

## 2024-02-12 ENCOUNTER — Emergency Department

## 2024-02-12 DIAGNOSIS — R600 Localized edema: Secondary | ICD-10-CM | POA: Diagnosis not present

## 2024-02-12 DIAGNOSIS — Z7982 Long term (current) use of aspirin: Secondary | ICD-10-CM | POA: Diagnosis not present

## 2024-02-12 DIAGNOSIS — S8012XA Contusion of left lower leg, initial encounter: Secondary | ICD-10-CM | POA: Diagnosis not present

## 2024-02-12 DIAGNOSIS — Z8673 Personal history of transient ischemic attack (TIA), and cerebral infarction without residual deficits: Secondary | ICD-10-CM | POA: Insufficient documentation

## 2024-02-12 DIAGNOSIS — I10 Essential (primary) hypertension: Secondary | ICD-10-CM | POA: Diagnosis not present

## 2024-02-12 DIAGNOSIS — Y93E1 Activity, personal bathing and showering: Secondary | ICD-10-CM | POA: Diagnosis not present

## 2024-02-12 DIAGNOSIS — Z7902 Long term (current) use of antithrombotics/antiplatelets: Secondary | ICD-10-CM | POA: Diagnosis not present

## 2024-02-12 DIAGNOSIS — Z043 Encounter for examination and observation following other accident: Secondary | ICD-10-CM | POA: Diagnosis not present

## 2024-02-12 DIAGNOSIS — W182XXA Fall in (into) shower or empty bathtub, initial encounter: Secondary | ICD-10-CM | POA: Insufficient documentation

## 2024-02-12 DIAGNOSIS — Y92002 Bathroom of unspecified non-institutional (private) residence single-family (private) house as the place of occurrence of the external cause: Secondary | ICD-10-CM | POA: Diagnosis not present

## 2024-02-12 DIAGNOSIS — I2510 Atherosclerotic heart disease of native coronary artery without angina pectoris: Secondary | ICD-10-CM | POA: Diagnosis not present

## 2024-02-12 DIAGNOSIS — M79662 Pain in left lower leg: Secondary | ICD-10-CM | POA: Diagnosis present

## 2024-02-12 DIAGNOSIS — W19XXXA Unspecified fall, initial encounter: Secondary | ICD-10-CM

## 2024-02-12 NOTE — Discharge Instructions (Signed)
 You were seen in the emergency department for a fall.  You had an x-ray that did not show any broken bones.  You had a large hematoma.  You can take Tylenol  for pain control.  Return to the emergency department for any worsening symptoms.  Follow-up closely with your primary care physician for reevaluation.

## 2024-02-12 NOTE — ED Triage Notes (Signed)
 Pt to ED from home c/o mechanical fall tonight while in the shower.  Denies hitting head or LOC, denies blood thinners.  Pt hit left shin on side of the tub causing hematoma to left leg.

## 2024-02-12 NOTE — ED Provider Notes (Signed)
 [facility] Provider Note    Event Date/Time   First MD Initiated Contact with Patient 02/12/24 0240     (approximate)   History   Fall   HPI  Edward Thompson is a 73 y.o. 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  Patient states that he slipped whenever trying to get out of the shower tonight.  Hit his left leg.  Complaining of pain to his left anterior shin.  Not on anticoagulation.  Does take aspirin  and Plavix .  No head injury or loss of consciousness.  Denies any pain anywhere other than his left shin.     Physical Exam   Triage Vital Signs: ED Triage Vitals  Encounter Vitals Group     BP 02/12/24 0237 129/66     Girls Systolic BP Percentile --      Girls Diastolic BP Percentile --      Boys Systolic BP Percentile --      Boys Diastolic BP Percentile --      Pulse Rate 02/12/24 0237 78     Resp 02/12/24 0237 16     Temp 02/12/24 0237 98.6 F (37 C)     Temp Source 02/12/24 0237 Oral     SpO2 02/12/24 0237 98 %     Weight 02/12/24 0235 180 lb (81.6 kg)     Height 02/12/24 0235 5' 4 (1.626 m)     Head Circumference --      Peak Flow --      Pain Score 02/12/24 0235 10     Pain Loc --      Pain Education --      Exclude from Growth Chart --     Most recent vital signs: Vitals:   02/12/24 0330 02/12/24 0334  BP: 133/64   Pulse: 72   Resp:    Temp:  98.5 F (36.9 C)  SpO2: 97%     Physical Exam Constitutional:      Appearance: He is well-developed.  HENT:     Head: Atraumatic.  Eyes:     Conjunctiva/sclera: Conjunctivae normal.  Cardiovascular:     Rate and Rhythm: Regular rhythm.  Pulmonary:     Effort: No respiratory distress.  Musculoskeletal:        General: Normal range of motion.     Cervical  back: Normal range of motion. No tenderness.     Comments: Hematoma with underlying tenderness to palpation to the left lower extremity.  No open wounds.  +2 radial and DP pulses that are equal bilaterally.  Skin:    General: Skin is warm.     Comments: Significant edema to bilateral feet which patient states this is baseline  Neurological:     Mental Status: He is alert. Mental status is at baseline.      IMPRESSION / MDM / ASSESSMENT AND PLAN / ED COURSE  I reviewed the triage vital signs and the nursing notes.  Differential diagnosis including fracture, hematoma.  No head injury and no midline cervical spine tenderness do not feel that CT scan of the head or neck is necessary at this time.  No other signs of trauma.   RADIOLOGY I independently reviewed imaging, my interpretation of imaging: X-ray of the left lower extremity with no acute findings   Labs (all labs ordered are  listed, but only abnormal results are displayed) Labs interpreted as -    Labs Reviewed - No data to display    Offered pain medication however patient declined.  At his mental status baseline.  Is here with his son.  Likely had a fall secondary to his recent CVA.  Wheelchair-bound at baseline.  X-ray imaging with no acute fracture.  Patient will discharge home with outpatient follow-up.  Discussed return precautions for any ongoing or worsening symptoms.  No questions at time of discharge.   PROCEDURES:  Critical Care performed: No  Procedures  Patient's presentation is most consistent with acute complicated illness / injury requiring diagnostic workup.   MEDICATIONS ORDERED IN ED: Medications - No data to display  FINAL CLINICAL IMPRESSION(S) / ED DIAGNOSES   Final diagnoses:  Fall, initial encounter  Hematoma of left lower leg     Rx / DC Orders   ED Discharge Orders     None        Note:  This document was prepared using Dragon voice recognition software and may include  unintentional dictation errors.   Suzanne Kirsch, MD 02/12/24 [phone]

## 2024-02-13 DIAGNOSIS — N390 Urinary tract infection, site not specified: Secondary | ICD-10-CM | POA: Diagnosis not present

## 2024-02-13 DIAGNOSIS — I69322 Dysarthria following cerebral infarction: Secondary | ICD-10-CM | POA: Diagnosis not present

## 2024-02-13 DIAGNOSIS — G3184 Mild cognitive impairment, so stated: Secondary | ICD-10-CM | POA: Diagnosis not present

## 2024-02-13 DIAGNOSIS — I10 Essential (primary) hypertension: Secondary | ICD-10-CM | POA: Diagnosis not present

## 2024-02-13 DIAGNOSIS — I69351 Hemiplegia and hemiparesis following cerebral infarction affecting right dominant side: Secondary | ICD-10-CM | POA: Diagnosis not present

## 2024-02-13 DIAGNOSIS — G9349 Other encephalopathy: Secondary | ICD-10-CM | POA: Diagnosis not present

## 2024-02-13 DIAGNOSIS — I2510 Atherosclerotic heart disease of native coronary artery without angina pectoris: Secondary | ICD-10-CM | POA: Diagnosis not present

## 2024-02-13 DIAGNOSIS — I69391 Dysphagia following cerebral infarction: Secondary | ICD-10-CM | POA: Diagnosis not present

## 2024-02-13 DIAGNOSIS — E119 Type 2 diabetes mellitus without complications: Secondary | ICD-10-CM | POA: Diagnosis not present

## 2024-02-14 DIAGNOSIS — G9349 Other encephalopathy: Secondary | ICD-10-CM | POA: Diagnosis not present

## 2024-02-14 DIAGNOSIS — I2510 Atherosclerotic heart disease of native coronary artery without angina pectoris: Secondary | ICD-10-CM | POA: Diagnosis not present

## 2024-02-14 DIAGNOSIS — I69391 Dysphagia following cerebral infarction: Secondary | ICD-10-CM | POA: Diagnosis not present

## 2024-02-14 DIAGNOSIS — N390 Urinary tract infection, site not specified: Secondary | ICD-10-CM | POA: Diagnosis not present

## 2024-02-14 DIAGNOSIS — G3184 Mild cognitive impairment, so stated: Secondary | ICD-10-CM | POA: Diagnosis not present

## 2024-02-14 DIAGNOSIS — I10 Essential (primary) hypertension: Secondary | ICD-10-CM | POA: Diagnosis not present

## 2024-02-14 DIAGNOSIS — E119 Type 2 diabetes mellitus without complications: Secondary | ICD-10-CM | POA: Diagnosis not present

## 2024-02-14 DIAGNOSIS — I69322 Dysarthria following cerebral infarction: Secondary | ICD-10-CM | POA: Diagnosis not present

## 2024-02-16 DIAGNOSIS — N390 Urinary tract infection, site not specified: Secondary | ICD-10-CM | POA: Diagnosis not present

## 2024-02-16 DIAGNOSIS — I10 Essential (primary) hypertension: Secondary | ICD-10-CM | POA: Diagnosis not present

## 2024-02-16 DIAGNOSIS — G9349 Other encephalopathy: Secondary | ICD-10-CM | POA: Diagnosis not present

## 2024-02-16 DIAGNOSIS — E119 Type 2 diabetes mellitus without complications: Secondary | ICD-10-CM | POA: Diagnosis not present

## 2024-02-16 DIAGNOSIS — I2510 Atherosclerotic heart disease of native coronary artery without angina pectoris: Secondary | ICD-10-CM | POA: Diagnosis not present

## 2024-02-16 DIAGNOSIS — I69351 Hemiplegia and hemiparesis following cerebral infarction affecting right dominant side: Secondary | ICD-10-CM | POA: Diagnosis not present

## 2024-02-16 DIAGNOSIS — G3184 Mild cognitive impairment, so stated: Secondary | ICD-10-CM | POA: Diagnosis not present

## 2024-02-16 DIAGNOSIS — I69322 Dysarthria following cerebral infarction: Secondary | ICD-10-CM | POA: Diagnosis not present

## 2024-02-17 DIAGNOSIS — I639 Cerebral infarction, unspecified: Secondary | ICD-10-CM | POA: Diagnosis not present

## 2024-02-17 DIAGNOSIS — G629 Polyneuropathy, unspecified: Secondary | ICD-10-CM | POA: Diagnosis not present

## 2024-02-20 DIAGNOSIS — N390 Urinary tract infection, site not specified: Secondary | ICD-10-CM | POA: Diagnosis not present

## 2024-02-20 DIAGNOSIS — E119 Type 2 diabetes mellitus without complications: Secondary | ICD-10-CM | POA: Diagnosis not present

## 2024-02-20 DIAGNOSIS — I10 Essential (primary) hypertension: Secondary | ICD-10-CM | POA: Diagnosis not present

## 2024-02-20 DIAGNOSIS — I2510 Atherosclerotic heart disease of native coronary artery without angina pectoris: Secondary | ICD-10-CM | POA: Diagnosis not present

## 2024-02-20 DIAGNOSIS — I69351 Hemiplegia and hemiparesis following cerebral infarction affecting right dominant side: Secondary | ICD-10-CM | POA: Diagnosis not present

## 2024-02-20 DIAGNOSIS — G9349 Other encephalopathy: Secondary | ICD-10-CM | POA: Diagnosis not present

## 2024-02-20 DIAGNOSIS — I69322 Dysarthria following cerebral infarction: Secondary | ICD-10-CM | POA: Diagnosis not present

## 2024-02-20 DIAGNOSIS — I69391 Dysphagia following cerebral infarction: Secondary | ICD-10-CM | POA: Diagnosis not present

## 2024-02-22 DIAGNOSIS — I10 Essential (primary) hypertension: Secondary | ICD-10-CM | POA: Diagnosis not present

## 2024-02-22 DIAGNOSIS — G3184 Mild cognitive impairment, so stated: Secondary | ICD-10-CM | POA: Diagnosis not present

## 2024-02-22 DIAGNOSIS — N390 Urinary tract infection, site not specified: Secondary | ICD-10-CM | POA: Diagnosis not present

## 2024-02-22 DIAGNOSIS — I69351 Hemiplegia and hemiparesis following cerebral infarction affecting right dominant side: Secondary | ICD-10-CM | POA: Diagnosis not present

## 2024-02-22 DIAGNOSIS — I2510 Atherosclerotic heart disease of native coronary artery without angina pectoris: Secondary | ICD-10-CM | POA: Diagnosis not present

## 2024-02-22 DIAGNOSIS — G9349 Other encephalopathy: Secondary | ICD-10-CM | POA: Diagnosis not present

## 2024-02-22 DIAGNOSIS — I69391 Dysphagia following cerebral infarction: Secondary | ICD-10-CM | POA: Diagnosis not present

## 2024-02-22 DIAGNOSIS — I69322 Dysarthria following cerebral infarction: Secondary | ICD-10-CM | POA: Diagnosis not present

## 2024-02-22 DIAGNOSIS — E119 Type 2 diabetes mellitus without complications: Secondary | ICD-10-CM | POA: Diagnosis not present

## 2024-02-23 ENCOUNTER — Ambulatory Visit: Payer: Self-pay | Admitting: Physician Assistant

## 2024-02-23 VITALS — BP 107/73 | HR 75 | Ht 64.0 in | Wt 195.0 lb

## 2024-02-23 DIAGNOSIS — R339 Retention of urine, unspecified: Secondary | ICD-10-CM | POA: Diagnosis not present

## 2024-02-23 NOTE — Progress Notes (Signed)
 02/23/2024 12:59 PM   Edward Thompson 20-Nov-1950 [id number]  CC: Chief Complaint  Patient presents with   Follow-up   Urinary Retention   HPI: Edward Thompson is a 73 y.o. male with PMH urinary retention managed with CIC and sensorimotor polyneuropathy of possible genetic etiology who presents today for annual follow-up.  He is accompanied today by his daughter, Edward Thompson, who contributes to HPI.  Today he reports he continues to CIC 5-6 times daily.  He is not having any difficulty passing the catheters to the level of his bladder and he has sufficient supplies.  He has occasional UTIs with symptoms including frequency, increasingly malodorous urine over baseline, and mucus in the urine.  His PCP has been managing these.  He underwent genetic testing in the past year with findings of 2 variants of unknown significance, unclear if related to his polyneuropathy.  He is scheduled for a full body MRI in February with [facility].  Notably, he had a stroke in August.  PMH: Past Medical History:  Diagnosis Date   CAD (coronary artery disease)    Carotid stenosis, left 08/2017   Diabetes mellitus without complication (HCC)    Foot drop    Fracture of neck (HCC) 2008   fell off a roof. required halo x 4 months. also fractured alot of vertebrae   Heart attack (HCC) 2015   Heart disease    Hepatitis    6th grade    Hyperlipidemia    Hypertension    Myocardial infarction acute (HCC) 2015   Seizures (HCC)    taking phenobarbitol and dilantin . LAST SEIZURE WAS 2016. well controlled on meds   Sleep apnea    USES CPAP   Syncope 2019   Vertigo    Viral meningitis     Surgical History: Past Surgical History:  Procedure Laterality Date   CARDIAC SURGERY     COLONOSCOPY     COLONOSCOPY WITH PROPOFOL  N/A 03/11/2021   Procedure: COLONOSCOPY WITH PROPOFOL ;  Surgeon: Toledo, Ladell POUR, MD;  Location: [facility];  Service: Gastroenterology;  Laterality: N/A;   CORONARY ANGIOPLASTY      PATIENT UNAWARE OF THIS   CORONARY ARTERY BYPASS GRAFT  2015   ENDARTERECTOMY Left 08/19/2017   Procedure: ENDARTERECTOMY CAROTID;  Surgeon: Jama Cordella MATSU, MD;  Location: [facility];  Service: Vascular;  Laterality: Left;   ESOPHAGOGASTRODUODENOSCOPY N/A 03/11/2021   Procedure: ESOPHAGOGASTRODUODENOSCOPY (EGD);  Surgeon: Toledo, Ladell POUR, MD;  Location: [facility];  Service: Gastroenterology;  Laterality: N/A;  DM   HERNIA REPAIR Right 1985   inguinal    Home Medications:  Allergies as of 02/23/2024   No Known Allergies      Medication List        Accurate as of February 23, 2024 12:59 PM. If you have any questions, ask your nurse or doctor.          STOP taking these medications    senna-docusate 8.6-50 MG tablet Commonly known as: Senokot-S       TAKE these medications    Accu-Chek Guide test strip Generic drug: glucose blood USE WITH DIABETIC METER TO TEST BLOOD SUGAR THREE TIMES DAILY   Accu-Chek Guide w/Device Kit   Accu-Chek Softclix Lancets lancets TEST BLOOD SUGAR THREE TIMES DAILY   acetaminophen  500 MG tablet Commonly known as: TYLENOL  Take 500 mg by mouth every 6 (six) hours as needed.   aspirin  EC 81 MG tablet Take 81 mg by mouth at bedtime.   clopidogrel  75  MG tablet Commonly known as: PLAVIX  TAKE 1 TABLET EVERY DAY   furosemide  20 MG tablet Commonly known as: LASIX  Take 1 tablet by mouth once daily   isosorbide  mononitrate 30 MG 24 hr tablet Commonly known as: IMDUR  TAKE 1 TABLET EVERY DAY   meclizine  25 MG tablet Commonly known as: ANTIVERT  Take 1 tablet (25 mg total) by mouth in the morning, at noon, in the evening, and at bedtime. TAKE 1 TABLET FOUR TIMES DAILY   metFORMIN  500 MG tablet Commonly known as: GLUCOPHAGE  Take 1 tablet (500 mg total) by mouth every morning.   metoprolol  succinate 25 MG 24 hr tablet Commonly known as: TOPROL -XL Take 0.5 tablets (12.5 mg total) by mouth daily.   nitroGLYCERIN  0.4 MG SL  tablet Commonly known as: NITROSTAT  Place 0.4 mg under the tongue every 5 (five) minutes as needed for chest pain.   pantoprazole  40 MG tablet Commonly known as: PROTONIX  Take 1 tablet (40 mg total) by mouth every morning.   PHENobarbital  97.2 MG tablet Commonly known as: LUMINAL Take 1 tablet (97.2 mg total) by mouth at bedtime.   phenytoin  30 MG ER capsule Commonly known as: Dilantin  Take 1 capsule (30 mg total) by mouth daily. Take with 1 capsule 200 mg dose for total daily dose 230 mg   phenytoin  200 MG ER capsule Commonly known as: DILANTIN  Take 1 capsule (200 mg total) by mouth daily. Take with 1 capsule 30 mg dose for total daily dose 230 mg   rosuvastatin  40 MG tablet Commonly known as: CRESTOR  Take 1 tablet (40 mg total) by mouth at bedtime.        Allergies:  No Known Allergies  Family History: Family History  Problem Relation Age of Onset   Heart disease Mother    Heart attack Father     Social History:   reports that he has never smoked. He has never used smokeless tobacco. He reports that he does not drink alcohol and does not use drugs.  Physical Exam: BP 107/73 (BP Location: Left Arm, Patient Position: Sitting, Cuff Size: Large)   Pulse 75   Ht 5' 4 (1.626 m)   Wt 195 lb (88.5 kg)   SpO2 96%   BMI 33.47 kg/m   Constitutional:  Alert and oriented, no acute distress, nontoxic appearing HEENT: [place], AT Cardiovascular: No clubbing, cyanosis, or edema Respiratory: Normal respiratory effort, no increased work of breathing Skin: No rashes, bruises or suspicious lesions Neurologic: Grossly intact, no focal deficits, moving all 4 extremities Psychiatric: Normal mood and affect  Assessment & Plan:   1. Urinary retention (Primary) Well-managed with CIC 5-6 times daily.  Will continue this.  I think he is likely to be chronically colonized due to CIC, so would discourage urine testing if he is not acutely symptomatic of UTI.  Return in about 1 year  (around 02/22/2025) for Annual CIC follow up.  Lucie Hones, PA-C  [facility] [place] [address] [place], [place] [address] [phone]

## 2024-03-01 ENCOUNTER — Encounter: Payer: Self-pay | Admitting: Cardiovascular Disease

## 2024-03-01 ENCOUNTER — Ambulatory Visit: Admitting: Cardiovascular Disease

## 2024-03-01 VITALS — BP 102/64 | HR 78 | Ht 64.0 in | Wt 195.0 lb

## 2024-03-01 DIAGNOSIS — I10 Essential (primary) hypertension: Secondary | ICD-10-CM | POA: Diagnosis not present

## 2024-03-01 DIAGNOSIS — I6522 Occlusion and stenosis of left carotid artery: Secondary | ICD-10-CM

## 2024-03-01 DIAGNOSIS — I252 Old myocardial infarction: Secondary | ICD-10-CM

## 2024-03-01 DIAGNOSIS — I2510 Atherosclerotic heart disease of native coronary artery without angina pectoris: Secondary | ICD-10-CM

## 2024-03-01 DIAGNOSIS — G4733 Obstructive sleep apnea (adult) (pediatric): Secondary | ICD-10-CM | POA: Diagnosis not present

## 2024-03-01 DIAGNOSIS — R55 Syncope and collapse: Secondary | ICD-10-CM | POA: Diagnosis not present

## 2024-03-01 DIAGNOSIS — G40909 Epilepsy, unspecified, not intractable, without status epilepticus: Secondary | ICD-10-CM

## 2024-03-01 DIAGNOSIS — Z955 Presence of coronary angioplasty implant and graft: Secondary | ICD-10-CM

## 2024-03-01 DIAGNOSIS — E782 Mixed hyperlipidemia: Secondary | ICD-10-CM | POA: Diagnosis not present

## 2024-03-01 DIAGNOSIS — I6389 Other cerebral infarction: Secondary | ICD-10-CM

## 2024-03-01 DIAGNOSIS — Z9889 Other specified postprocedural states: Secondary | ICD-10-CM

## 2024-03-01 DIAGNOSIS — Z131 Encounter for screening for diabetes mellitus: Secondary | ICD-10-CM

## 2024-03-01 DIAGNOSIS — E7800 Pure hypercholesterolemia, unspecified: Secondary | ICD-10-CM

## 2024-03-01 MED ORDER — FUROSEMIDE 20 MG PO TABS: 20.0000 mg | ORAL_TABLET | Freq: Every day | ORAL | 0 refills | Status: AC

## 2024-03-01 MED ORDER — CLOPIDOGREL BISULFATE 75 MG PO TABS: 75.0000 mg | ORAL_TABLET | Freq: Every day | ORAL | 2 refills | Status: AC

## 2024-03-01 MED ORDER — ROSUVASTATIN CALCIUM 40 MG PO TABS: 40.0000 mg | ORAL_TABLET | Freq: Every day | ORAL | 3 refills | Status: AC

## 2024-03-01 NOTE — Progress Notes (Signed)
 Cardiology Office Note   Date:  03/01/2024   ID:  Edward Thompson, Edward Thompson [birth year], MRN [id number]  PCP:  Albina GORMAN Dine, MD  Cardiologist:  Denyse Bathe, MD      History of Present Illness: Edward Thompson is a 73 y.o. male who presents for  Chief Complaint  Patient presents with   Follow-up    4 month follow up    Had a stroke, 2 months ago. Been off plavix .      Past Medical History:  Diagnosis Date   CAD (coronary artery disease)    Carotid stenosis, left 08/2017   Diabetes mellitus without complication (HCC)    Foot drop    Fracture of neck (HCC) 2008   fell off a roof. required halo x 4 months. also fractured alot of vertebrae   Heart attack (HCC) 2015   Heart disease    Hepatitis    6th grade    Hyperlipidemia    Hypertension    Myocardial infarction acute (HCC) 2015   Seizures (HCC)    taking phenobarbitol and dilantin . LAST SEIZURE WAS 2016. well controlled on meds   Sleep apnea    USES CPAP   Syncope 2019   Vertigo    Viral meningitis      Past Surgical History:  Procedure Laterality Date   CARDIAC SURGERY     COLONOSCOPY     COLONOSCOPY WITH PROPOFOL  N/A 03/11/2021   Procedure: COLONOSCOPY WITH PROPOFOL ;  Surgeon: Toledo, Ladell POUR, MD;  Location: [facility];  Service: Gastroenterology;  Laterality: N/A;   CORONARY ANGIOPLASTY     PATIENT UNAWARE OF THIS   CORONARY ARTERY BYPASS GRAFT  2015   ENDARTERECTOMY Left 08/19/2017   Procedure: ENDARTERECTOMY CAROTID;  Surgeon: Jama Cordella MATSU, MD;  Location: [facility];  Service: Vascular;  Laterality: Left;   ESOPHAGOGASTRODUODENOSCOPY N/A 03/11/2021   Procedure: ESOPHAGOGASTRODUODENOSCOPY (EGD);  Surgeon: Toledo, Ladell POUR, MD;  Location: [facility];  Service: Gastroenterology;  Laterality: N/A;  DM   HERNIA REPAIR Right 1985   inguinal     Current Outpatient Medications  Medication Sig Dispense Refill   Accu-Chek Softclix Lancets lancets TEST BLOOD SUGAR THREE TIMES DAILY 300  each 11   acetaminophen  (TYLENOL ) 500 MG tablet Take 500 mg by mouth every 6 (six) hours as needed.     aspirin  EC 81 MG tablet Take 81 mg by mouth at bedtime.     Blood Glucose Monitoring Suppl (ACCU-CHEK GUIDE) w/Device KIT      glucose blood (ACCU-CHEK GUIDE) test strip USE WITH DIABETIC METER TO TEST BLOOD SUGAR THREE TIMES DAILY 300 strip 3   isosorbide  mononitrate (IMDUR ) 30 MG 24 hr tablet TAKE 1 TABLET EVERY DAY 90 tablet 3   meclizine  (ANTIVERT ) 25 MG tablet Take 1 tablet (25 mg total) by mouth in the morning, at noon, in the evening, and at bedtime. TAKE 1 TABLET FOUR TIMES DAILY 360 tablet 3   metFORMIN  (GLUCOPHAGE ) 500 MG tablet Take 1 tablet (500 mg total) by mouth every morning. 90 tablet 1   metoprolol  succinate (TOPROL -XL) 25 MG 24 hr tablet Take 0.5 tablets (12.5 mg total) by mouth daily.     nitroGLYCERIN  (NITROSTAT ) 0.4 MG SL tablet Place 0.4 mg under the tongue every 5 (five) minutes as needed for chest pain.     pantoprazole  (PROTONIX ) 40 MG tablet Take 1 tablet (40 mg total) by mouth every morning. 90 tablet 3   PHENobarbital  (LUMINAL) 97.2 MG tablet Take 1  tablet (97.2 mg total) by mouth at bedtime. 90 tablet 3   phenytoin  (DILANTIN ) 200 MG ER capsule Take 1 capsule (200 mg total) by mouth daily. Take with 1 capsule 30 mg dose for total daily dose 230 mg 90 capsule 3   phenytoin  (DILANTIN ) 30 MG ER capsule Take 1 capsule (30 mg total) by mouth daily. Take with 1 capsule 200 mg dose for total daily dose 230 mg 90 capsule 1   clopidogrel  (PLAVIX ) 75 MG tablet Take 1 tablet (75 mg total) by mouth daily. 30 tablet 2   furosemide  (LASIX ) 20 MG tablet Take 1 tablet (20 mg total) by mouth daily. 30 tablet 0   rosuvastatin  (CRESTOR ) 40 MG tablet Take 1 tablet (40 mg total) by mouth at bedtime. 90 tablet 3   No current facility-administered medications for this visit.    Allergies:   Patient has no known allergies.    Social History:   reports that he has never smoked. He has  never used smokeless tobacco. He reports that he does not drink alcohol and does not use drugs.   Family History:  family history includes Heart attack in his father; Heart disease in his mother.    ROS:     Review of Systems  Constitutional: Negative.   HENT: Negative.    Eyes: Negative.   Respiratory: Negative.    Gastrointestinal: Negative.   Genitourinary: Negative.   Musculoskeletal: Negative.   Skin: Negative.   Neurological: Negative.   Endo/Heme/Allergies: Negative.   Psychiatric/Behavioral: Negative.    All other systems reviewed and are negative.     All other systems are reviewed and negative.    PHYSICAL EXAM: VS:  BP 102/64   Pulse 78   Ht 5' 4 (1.626 m)   Wt 195 lb (88.5 kg)   SpO2 96%   BMI 33.47 kg/m  , BMI Body mass index is 33.47 kg/m. Last weight:  Wt Readings from Last 3 Encounters:  03/01/24 195 lb (88.5 kg)  02/23/24 195 lb (88.5 kg)  02/12/24 180 lb (81.6 kg)     Physical Exam Vitals reviewed.  Constitutional:      Appearance: Normal appearance. He is normal weight.  HENT:     Head: Normocephalic.     Nose: Nose normal.     Mouth/Throat:     Mouth: Mucous membranes are moist.  Eyes:     Pupils: Pupils are equal, round, and reactive to light.  Cardiovascular:     Rate and Rhythm: Normal rate and regular rhythm.     Pulses: Normal pulses.     Heart sounds: Normal heart sounds.  Pulmonary:     Effort: Pulmonary effort is normal.  Abdominal:     General: Abdomen is flat. Bowel sounds are normal.  Musculoskeletal:        General: Normal range of motion.     Cervical back: Normal range of motion.  Skin:    General: Skin is warm.  Neurological:     General: No focal deficit present.     Mental Status: He is alert.  Psychiatric:        Mood and Affect: Mood normal.       EKG:   Recent Labs: 01/01/2024: TSH 2.232 01/27/2024: ALT 12; BUN 12; Creatinine, Ser 0.67; Hemoglobin 12.2; Platelets 190; Potassium 4.2; Sodium 134     Lipid Panel    Component Value Date/Time   CHOL 112 01/02/2024 0523   CHOL 133 10/26/2023 0913   CHOL  117 09/23/2013 0132   TRIG 45 01/02/2024 0523   TRIG 64 09/23/2013 0132   HDL 53 01/02/2024 0523   HDL 62 10/26/2023 0913   HDL 49 09/23/2013 0132   CHOLHDL 2.1 01/02/2024 0523   VLDL 9 01/02/2024 0523   VLDL 13 09/23/2013 0132   LDLCALC 50 01/02/2024 0523   LDLCALC 61 10/26/2023 0913   LDLCALC 55 09/23/2013 0132      Other studies Reviewed: Additional studies/ records that were reviewed today include:  Review of the above records demonstrates:       No data to display            ASSESSMENT AND PLAN:    ICD-10-CM   1. Cerebrovascular accident (CVA) due to other mechanism (HCC)  I63.89 clopidogrel  (PLAVIX ) 75 MG tablet   Left side wekness improving.    2. Coronary artery disease involving native coronary artery of native heart without angina pectoris  I25.10 clopidogrel  (PLAVIX ) 75 MG tablet    furosemide  (LASIX ) 20 MG tablet   Send plavix ., no chest pain.    3. Stenosis of left carotid artery  I65.22 clopidogrel  (PLAVIX ) 75 MG tablet    4. Essential hypertension  I10 clopidogrel  (PLAVIX ) 75 MG tablet    5. Old myocardial infarction  I25.2 clopidogrel  (PLAVIX ) 75 MG tablet    6. Syncope, unspecified syncope type  R55 furosemide  (LASIX ) 20 MG tablet    7. Mixed hyperlipidemia  E78.2 furosemide  (LASIX ) 20 MG tablet    8. CAD s/p coronary artery stent placement  Z95.5 furosemide  (LASIX ) 20 MG tablet    9. OSA on CPAP  G47.33 furosemide  (LASIX ) 20 MG tablet    10. Seizure disorder (HCC)  G40.909 furosemide  (LASIX ) 20 MG tablet    11. Athscl heart disease of native coronary artery w/o ang pctrs  I25.10 furosemide  (LASIX ) 20 MG tablet    12. Coronary artery disease involving native coronary artery of native heart without angina pectoris  I25.10 clopidogrel  (PLAVIX ) 75 MG tablet    furosemide  (LASIX ) 20 MG tablet    13. S/P carotid endarterectomy  Z98.890  furosemide  (LASIX ) 20 MG tablet    14. Pure hypercholesterolemia  E78.00 rosuvastatin  (CRESTOR ) 40 MG tablet       Problem List Items Addressed This Visit       Cardiovascular and Mediastinum   Carotid stenosis   Relevant Medications   clopidogrel  (PLAVIX ) 75 MG tablet   furosemide  (LASIX ) 20 MG tablet   rosuvastatin  (CRESTOR ) 40 MG tablet   Essential hypertension   Relevant Medications   clopidogrel  (PLAVIX ) 75 MG tablet   furosemide  (LASIX ) 20 MG tablet   rosuvastatin  (CRESTOR ) 40 MG tablet   CAD (coronary artery disease)   Relevant Medications   clopidogrel  (PLAVIX ) 75 MG tablet   furosemide  (LASIX ) 20 MG tablet   rosuvastatin  (CRESTOR ) 40 MG tablet   Old myocardial infarction   Relevant Medications   clopidogrel  (PLAVIX ) 75 MG tablet   furosemide  (LASIX ) 20 MG tablet   rosuvastatin  (CRESTOR ) 40 MG tablet   Athscl heart disease of native coronary artery w/o ang pctrs   Relevant Medications   furosemide  (LASIX ) 20 MG tablet   rosuvastatin  (CRESTOR ) 40 MG tablet   Stroke (HCC) - Primary   Relevant Medications   clopidogrel  (PLAVIX ) 75 MG tablet   furosemide  (LASIX ) 20 MG tablet   rosuvastatin  (CRESTOR ) 40 MG tablet     Respiratory   OSA on CPAP   Relevant Medications   furosemide  (LASIX ) 20  MG tablet     Nervous and Auditory   Seizure disorder (HCC)   Relevant Medications   furosemide  (LASIX ) 20 MG tablet     Other   Syncope   Relevant Medications   furosemide  (LASIX ) 20 MG tablet   Hyperlipidemia   Relevant Medications   furosemide  (LASIX ) 20 MG tablet   rosuvastatin  (CRESTOR ) 40 MG tablet   S/P carotid endarterectomy   Relevant Medications   furosemide  (LASIX ) 20 MG tablet   CAD s/p coronary artery stent placement   Relevant Medications   furosemide  (LASIX ) 20 MG tablet       Disposition:   Return in about 3 months (around 06/01/2024).    Total time spent: 30 minutes  Signed,  Denyse Bathe, MD  03/01/2024 3:55 PM    [facility]

## 2024-03-11 ENCOUNTER — Emergency Department

## 2024-03-11 ENCOUNTER — Observation Stay
Admission: EM | Admit: 2024-03-11 | Discharge: 2024-03-13 | Disposition: A | Discharge status: Home / Self Care | Attending: Family Medicine | Admitting: Family Medicine

## 2024-03-11 ENCOUNTER — Other Ambulatory Visit: Payer: Self-pay

## 2024-03-11 DIAGNOSIS — Z79899 Other long term (current) drug therapy: Secondary | ICD-10-CM | POA: Insufficient documentation

## 2024-03-11 DIAGNOSIS — W06XXXA Fall from bed, initial encounter: Secondary | ICD-10-CM | POA: Insufficient documentation

## 2024-03-11 DIAGNOSIS — Z794 Long term (current) use of insulin: Secondary | ICD-10-CM | POA: Insufficient documentation

## 2024-03-11 DIAGNOSIS — K625 Hemorrhage of anus and rectum: Principal | ICD-10-CM

## 2024-03-11 DIAGNOSIS — J939 Pneumothorax, unspecified: Secondary | ICD-10-CM | POA: Diagnosis not present

## 2024-03-11 DIAGNOSIS — S2239XA Fracture of one rib, unspecified side, initial encounter for closed fracture: Secondary | ICD-10-CM | POA: Insufficient documentation

## 2024-03-11 DIAGNOSIS — Z7901 Long term (current) use of anticoagulants: Secondary | ICD-10-CM | POA: Insufficient documentation

## 2024-03-11 DIAGNOSIS — I6782 Cerebral ischemia: Secondary | ICD-10-CM | POA: Diagnosis not present

## 2024-03-11 DIAGNOSIS — S32049A Unspecified fracture of fourth lumbar vertebra, initial encounter for closed fracture: Secondary | ICD-10-CM | POA: Insufficient documentation

## 2024-03-11 DIAGNOSIS — I700 Atherosclerosis of aorta: Secondary | ICD-10-CM | POA: Diagnosis not present

## 2024-03-11 DIAGNOSIS — I10 Essential (primary) hypertension: Secondary | ICD-10-CM | POA: Diagnosis not present

## 2024-03-11 DIAGNOSIS — S270XXA Traumatic pneumothorax, initial encounter: Principal | ICD-10-CM | POA: Insufficient documentation

## 2024-03-11 DIAGNOSIS — S0083XA Contusion of other part of head, initial encounter: Secondary | ICD-10-CM | POA: Diagnosis not present

## 2024-03-11 DIAGNOSIS — G4733 Obstructive sleep apnea (adult) (pediatric): Secondary | ICD-10-CM | POA: Insufficient documentation

## 2024-03-11 DIAGNOSIS — I672 Cerebral atherosclerosis: Secondary | ICD-10-CM | POA: Diagnosis not present

## 2024-03-11 DIAGNOSIS — S0990XA Unspecified injury of head, initial encounter: Secondary | ICD-10-CM | POA: Diagnosis not present

## 2024-03-11 DIAGNOSIS — E119 Type 2 diabetes mellitus without complications: Secondary | ICD-10-CM | POA: Insufficient documentation

## 2024-03-11 DIAGNOSIS — Z951 Presence of aortocoronary bypass graft: Secondary | ICD-10-CM | POA: Insufficient documentation

## 2024-03-11 DIAGNOSIS — Z7982 Long term (current) use of aspirin: Secondary | ICD-10-CM | POA: Insufficient documentation

## 2024-03-11 DIAGNOSIS — I517 Cardiomegaly: Secondary | ICD-10-CM | POA: Diagnosis not present

## 2024-03-11 DIAGNOSIS — N281 Cyst of kidney, acquired: Secondary | ICD-10-CM | POA: Diagnosis not present

## 2024-03-11 DIAGNOSIS — K922 Gastrointestinal hemorrhage, unspecified: Secondary | ICD-10-CM | POA: Insufficient documentation

## 2024-03-11 DIAGNOSIS — R918 Other nonspecific abnormal finding of lung field: Secondary | ICD-10-CM | POA: Diagnosis not present

## 2024-03-11 DIAGNOSIS — S199XXA Unspecified injury of neck, initial encounter: Secondary | ICD-10-CM | POA: Diagnosis not present

## 2024-03-11 DIAGNOSIS — I2510 Atherosclerotic heart disease of native coronary artery without angina pectoris: Secondary | ICD-10-CM | POA: Insufficient documentation

## 2024-03-11 DIAGNOSIS — G40909 Epilepsy, unspecified, not intractable, without status epilepticus: Secondary | ICD-10-CM | POA: Insufficient documentation

## 2024-03-11 DIAGNOSIS — J189 Pneumonia, unspecified organism: Secondary | ICD-10-CM

## 2024-03-11 DIAGNOSIS — R109 Unspecified abdominal pain: Secondary | ICD-10-CM | POA: Diagnosis not present

## 2024-03-11 DIAGNOSIS — Z8673 Personal history of transient ischemic attack (TIA), and cerebral infarction without residual deficits: Secondary | ICD-10-CM | POA: Insufficient documentation

## 2024-03-11 DIAGNOSIS — Z1152 Encounter for screening for COVID-19: Secondary | ICD-10-CM | POA: Insufficient documentation

## 2024-03-11 DIAGNOSIS — Z23 Encounter for immunization: Secondary | ICD-10-CM | POA: Insufficient documentation

## 2024-03-11 DIAGNOSIS — K5730 Diverticulosis of large intestine without perforation or abscess without bleeding: Secondary | ICD-10-CM | POA: Diagnosis not present

## 2024-03-11 DIAGNOSIS — R339 Retention of urine, unspecified: Secondary | ICD-10-CM | POA: Insufficient documentation

## 2024-03-11 LAB — CBC WITH DIFFERENTIAL/PLATELET
Abs Immature Granulocytes: 0.05 K/uL (ref 0.00–0.07)
Basophils Absolute: 0 K/uL (ref 0.0–0.1)
Basophils Relative: 0 %
Eosinophils Absolute: 0.1 K/uL (ref 0.0–0.5)
Eosinophils Relative: 1 %
HCT: 36.8 % — ABNORMAL LOW (ref 39.0–52.0)
Hemoglobin: 11.9 g/dL — ABNORMAL LOW (ref 13.0–17.0)
Immature Granulocytes: 1 %
Lymphocytes Relative: 14 %
Lymphs Abs: 1.4 K/uL (ref 0.7–4.0)
MCH: 29.8 pg (ref 26.0–34.0)
MCHC: 32.3 g/dL (ref 30.0–36.0)
MCV: 92 fL (ref 80.0–100.0)
Monocytes Absolute: 1 K/uL (ref 0.1–1.0)
Monocytes Relative: 9 %
Neutro Abs: 7.9 K/uL — ABNORMAL HIGH (ref 1.7–7.7)
Neutrophils Relative %: 75 %
Platelets: 212 K/uL (ref 150–400)
RBC: 4 MIL/uL — ABNORMAL LOW (ref 4.22–5.81)
RDW: 15 % (ref 11.5–15.5)
WBC: 10.4 K/uL (ref 4.0–10.5)
nRBC: 0 % (ref 0.0–0.2)

## 2024-03-11 LAB — URINALYSIS, W/ REFLEX TO CULTURE (INFECTION SUSPECTED)
Bilirubin Urine: NEGATIVE
Glucose, UA: NEGATIVE mg/dL
Hgb urine dipstick: NEGATIVE
Ketones, ur: 20 mg/dL — AB
Nitrite: NEGATIVE
Protein, ur: NEGATIVE mg/dL
Specific Gravity, Urine: >1.046 — ABNORMAL HIGH (ref 1.005–1.030)
Squamous Epithelial / HPF: 0 /HPF (ref 0–5)
WBC, UA: >50 WBC/hpf (ref 0–5)
pH: 7 (ref 5.0–8.0)

## 2024-03-11 LAB — MRSA NEXT GEN BY PCR, NASAL: MRSA by PCR Next Gen: NOT DETECTED

## 2024-03-11 LAB — COMPREHENSIVE METABOLIC PANEL WITH GFR
ALT: 11 U/L (ref 0–44)
AST: 19 U/L (ref 15–41)
Albumin: 3.9 g/dL (ref 3.5–5.0)
Alkaline Phosphatase: 79 U/L (ref 38–126)
Anion gap: 13 (ref 5–15)
BUN: 15 mg/dL (ref 8–23)
CO2: 21 mmol/L — ABNORMAL LOW (ref 22–32)
Calcium: 9 mg/dL (ref 8.9–10.3)
Chloride: 106 mmol/L (ref 98–111)
Creatinine, Ser: 0.76 mg/dL (ref 0.61–1.24)
GFR, Estimated: >60 mL/min (ref >60)
Glucose, Bld: 110 mg/dL — ABNORMAL HIGH (ref 70–99)
Potassium: 3.8 mmol/L (ref 3.5–5.1)
Sodium: 140 mmol/L (ref 135–145)
Total Bilirubin: 0.6 mg/dL (ref 0.0–1.2)
Total Protein: 7.6 g/dL (ref 6.5–8.1)

## 2024-03-11 LAB — CBC
HCT: 36.7 % — ABNORMAL LOW (ref 39.0–52.0)
Hemoglobin: 12 g/dL — ABNORMAL LOW (ref 13.0–17.0)
MCH: 29.8 pg (ref 26.0–34.0)
MCHC: 32.7 g/dL (ref 30.0–36.0)
MCV: 91.1 fL (ref 80.0–100.0)
Platelets: 203 K/uL (ref 150–400)
RBC: 4.03 MIL/uL — ABNORMAL LOW (ref 4.22–5.81)
RDW: 14.9 % (ref 11.5–15.5)
WBC: 10.5 K/uL (ref 4.0–10.5)
nRBC: 0 % (ref 0.0–0.2)

## 2024-03-11 LAB — TYPE AND SCREEN
ABO/RH(D): O POS
Antibody Screen: NEGATIVE

## 2024-03-11 LAB — RESP PANEL BY RT-PCR (RSV, FLU A&B, COVID)  RVPGX2
Influenza A by PCR: NEGATIVE
Influenza B by PCR: NEGATIVE
Resp Syncytial Virus by PCR: NEGATIVE
SARS Coronavirus 2 by RT PCR: NEGATIVE

## 2024-03-11 LAB — TROPONIN I (HIGH SENSITIVITY): Troponin I (High Sensitivity): 4 ng/L (ref <18)

## 2024-03-11 LAB — PROCALCITONIN: Procalcitonin: <0.1 ng/mL

## 2024-03-11 MED ORDER — ASPIRIN 81 MG PO TBEC
81.0000 mg | DELAYED_RELEASE_TABLET | Freq: Every day | ORAL | Status: DC
  Administered 2024-03-11 – 2024-03-12 (×2): 81 mg via ORAL
  Filled 2024-03-11 (×2): qty 1

## 2024-03-11 MED ORDER — PHENYTOIN SODIUM EXTENDED 30 MG PO CAPS
30.0000 mg | ORAL_CAPSULE | Freq: Every day | ORAL | Status: DC
Start: 2024-03-11 — End: 2024-03-13
  Administered 2024-03-11 – 2024-03-13 (×3): 30 mg via ORAL
  Filled 2024-03-11 (×3): qty 1

## 2024-03-11 MED ORDER — CLOPIDOGREL BISULFATE 75 MG PO TABS
75.0000 mg | ORAL_TABLET | Freq: Every day | ORAL | Status: DC
Start: 2024-03-11 — End: 2024-03-13
  Administered 2024-03-11 – 2024-03-13 (×3): 75 mg via ORAL
  Filled 2024-03-11 (×3): qty 1

## 2024-03-11 MED ORDER — PHENOBARBITAL 32.4 MG PO TABS
97.2000 mg | ORAL_TABLET | Freq: Every day | ORAL | Status: DC
  Administered 2024-03-11 – 2024-03-12 (×2): 97.2 mg via ORAL
  Filled 2024-03-11 (×2): qty 3

## 2024-03-11 MED ORDER — HEPARIN SODIUM (PORCINE) 5000 UNIT/ML IJ SOLN
5000.0000 [IU] | Freq: Three times a day (TID) | INTRAMUSCULAR | Status: DC
  Administered 2024-03-11 – 2024-03-13 (×6): 5000 [IU] via SUBCUTANEOUS
  Filled 2024-03-11 (×6): qty 1

## 2024-03-11 MED ORDER — PHENYTOIN SODIUM EXTENDED 100 MG PO CAPS
200.0000 mg | ORAL_CAPSULE | Freq: Every day | ORAL | Status: DC
  Administered 2024-03-11 – 2024-03-13 (×3): 200 mg via ORAL
  Filled 2024-03-11 (×3): qty 2

## 2024-03-11 MED ORDER — ROSUVASTATIN CALCIUM 10 MG PO TABS
40.0000 mg | ORAL_TABLET | Freq: Every day | ORAL | Status: DC
  Administered 2024-03-11 – 2024-03-12 (×2): 40 mg via ORAL
  Filled 2024-03-11: qty 2
  Filled 2024-03-11 (×2): qty 4

## 2024-03-11 MED ORDER — PANTOPRAZOLE SODIUM 40 MG IV SOLR
40.0000 mg | Freq: Once | INTRAVENOUS | Status: AC
  Administered 2024-03-11: 40 mg via INTRAVENOUS
  Filled 2024-03-11: qty 10

## 2024-03-11 MED ORDER — LABETALOL HCL 5 MG/ML IV SOLN: 10.0000 mg | INTRAVENOUS | Status: DC | PRN

## 2024-03-11 MED ORDER — HYDRALAZINE HCL 20 MG/ML IJ SOLN: 10.0000 mg | Freq: Four times a day (QID) | INTRAMUSCULAR | Status: DC | PRN

## 2024-03-11 MED ORDER — IOHEXOL 350 MG/ML SOLN
100.0000 mL | Freq: Once | INTRAVENOUS | Status: AC | PRN
  Administered 2024-03-11: 100 mL via INTRAVENOUS

## 2024-03-11 MED ORDER — INFLUENZA VAC SPLIT HIGH-DOSE 0.5 ML IM SUSY
0.5000 mL | PREFILLED_SYRINGE | INTRAMUSCULAR | Status: AC
  Administered 2024-03-12: 0.5 mL via INTRAMUSCULAR
  Filled 2024-03-11: qty 0.5

## 2024-03-11 MED ORDER — FUROSEMIDE 20 MG PO TABS
20.0000 mg | ORAL_TABLET | Freq: Every day | ORAL | Status: DC
  Administered 2024-03-11 – 2024-03-13 (×3): 20 mg via ORAL
  Filled 2024-03-11 (×3): qty 1

## 2024-03-11 NOTE — ED Notes (Addendum)
 Nonrebreather mask on 15L applied per Dr Rays verbal order

## 2024-03-11 NOTE — ED Triage Notes (Signed)
 Pt to ED POV with family member for mechanical fall this AM getting out of bed and hit head on the floor, rectal bleeding since 2 days, and poor oral intake since 3 days. Pt is wheelchair bound. Pt denies NVD. Pt is generally weaker than usual. Pt also complains of new back pain. Pt takes blood thinner. Pt had stroke about 2 months ago with residual R hemiparesis and dysarthria.

## 2024-03-11 NOTE — Consult Note (Signed)
 This is a case of 73 year old male patient with past medical history of progressive genetic neuromuscular disease wheelchair-bound and neurogenic bladder with self cathing presenting to [facility] on 11/02 after a fall.  It appears that he had a witnessed fall while getting out of bed earlier today.  He was brought in to [facility] for further evaluation.  As part of the trauma protocol he underwent a CT cervical spine that showed trace right apical pneumothorax.  Pulmonary team is being consulted for help with further management.  Subsequently underwent a CT chest that showed right lower lobe consolidation.  And redemonstrated trace apical pneumothorax.  On my encounter patient appears lethargic without any distress.   GEN NAD  HEENT Supple neck, reactive pupils  CVS Normal S1, Normal S2, RRR  Lungs: Clear bilateral air entry  Abdomen: Soft, non tender, non distended  Extremities Warm and well perfused.   Labs and imaigng were reviewed   Assessment and plan  This is a case of 73 year old male patient with past medical history of progressive genetic neuromuscular disease wheelchair-bound and neurogenic bladder with self cathing presenting to [facility] on 11/02 after a fall.  It appears that he had a witnessed fall while getting out of bed earlier today.  He was brought in to [facility] for further evaluation. CT chest with right lower lobe consilidation and right apical PTX.   #Trace apical pneumothorax s/p fall. Very small and does not require any intervention at this time.  #RLL Pneumonia suspicous for aspiration pneumonia in the setting of recent CVA   []  Can place on non rebreather at 100% fiO2 overnight. CXR in the morning.  []  Ceftriaxone  azithromycin for CAP.   Pulmonary team will follow peripherally.  Please do not hesitate to reach out with any question.   I personally spent a total of 80 minutes in the care of the patient today including preparing to see the patient, getting/reviewing separately  obtained history, performing a medically appropriate exam/evaluation, counseling and educating, placing orders, documenting clinical information in the EHR, independently interpreting results, and communicating results.   Darrin Barn, MD [facility] 03/11/2024 5:05 PM

## 2024-03-11 NOTE — H&P (Signed)
 History and Physical    Patient: Edward Thompson FMW:[id number] DOB: 07/25/1950 DOA: 03/11/2024 DOS: the patient was seen and examined on 03/11/2024 PCP: Albina GORMAN Dine, MD  Patient coming from: Home  Chief Complaint:  Chief Complaint  Patient presents with   poor appetite   Fall   Rectal Bleeding   HPI: Edward Thompson is a 73 y.o. male with medical history significant of CAD, epilepsy on phenobarbital  phenytoin , OSA, hypertension, memory loss, frequent falls and weakness, severe sensorimotor polyneuropathy, history of CVA, diabetes, who presents to the ED with worsening weakness.  Patient has been getting progressively weaker over the last many years.  He has been following with neurology outpatient.  Due to progressive weakness he is mostly wheelchair-bound and self catheterizes.  He does not have a true diagnosis of this condition but it appears to have some genetic component as he has multiple brothers with the same mobility issues.  This morning patient had witnessed fall while getting out of bed, no reported loss of consciousness.  Family also reports intermittent rectal bleeding for the last few days which has been noted in his depends.  His daughter, [name], bedside reports he has frequent UTIs.  He currently denies any lower urinary tract symptoms.  No recent fever.  No coughing.  On arrival to the ED labs and vitals are grossly unremarkable, hemoglobin 12.0.  Patient received pan scan.  CTA bleed scan negative for acute GI bleed.  CT head is notable for old infarct, CT cervical spine is without acute cervical changes but does note tiny apical pneumothorax.  Patient maintains 100% O2 sat on room air.  EDP consulted pulmonology who recommended nonrebreather and CT chest.   Review of Systems: As mentioned in the history of present illness. All other systems reviewed and are negative. Past Medical History:  Diagnosis Date   CAD (coronary artery disease)    Carotid stenosis,  left 08/2017   Diabetes mellitus without complication (HCC)    Foot drop    Fracture of neck (HCC) 2008   fell off a roof. required halo x 4 months. also fractured alot of vertebrae   Heart attack (HCC) 2015   Heart disease    Hepatitis    6th grade    Hyperlipidemia    Hypertension    Myocardial infarction acute (HCC) 2015   Seizures (HCC)    taking phenobarbitol and dilantin . LAST SEIZURE WAS 2016. well controlled on meds   Sleep apnea    USES CPAP   Stroke (HCC) 2025   R hemiparesis and speech affected   Syncope 2019   Vertigo    Viral meningitis    Past Surgical History:  Procedure Laterality Date   CARDIAC SURGERY     COLONOSCOPY     COLONOSCOPY WITH PROPOFOL  N/A 03/11/2021   Procedure: COLONOSCOPY WITH PROPOFOL ;  Surgeon: Toledo, Ladell POUR, MD;  Location: [facility];  Service: Gastroenterology;  Laterality: N/A;   CORONARY ANGIOPLASTY     PATIENT UNAWARE OF THIS   CORONARY ARTERY BYPASS GRAFT  2015   ENDARTERECTOMY Left 08/19/2017   Procedure: ENDARTERECTOMY CAROTID;  Surgeon: Jama Cordella MATSU, MD;  Location: [facility];  Service: Vascular;  Laterality: Left;   ESOPHAGOGASTRODUODENOSCOPY N/A 03/11/2021   Procedure: ESOPHAGOGASTRODUODENOSCOPY (EGD);  Surgeon: Toledo, Ladell POUR, MD;  Location: [facility];  Service: Gastroenterology;  Laterality: N/A;  DM   HERNIA REPAIR Right 1985   inguinal   Social History:  reports that he has never smoked. He has  never used smokeless tobacco. He reports that he does not drink alcohol and does not use drugs.  No Known Allergies  Family History  Problem Relation Age of Onset   Heart disease Mother    Heart attack Father     Prior to Admission medications   Medication Sig Start Date End Date Taking? Authorizing Provider  amoxicillin (AMOXIL) 875 MG tablet Take 875 mg by mouth 2 (two) times daily. 02/20/24  Yes [provider]  Accu-Chek Softclix Lancets lancets TEST BLOOD SUGAR THREE TIMES DAILY 08/27/22   Fernand Fredy RAMAN, MD  acetaminophen  (TYLENOL ) 500 MG tablet Take 500 mg by mouth every 6 (six) hours as needed.    [provider]  aspirin  EC 81 MG tablet Take 81 mg by mouth at bedtime.    [provider]  Blood Glucose Monitoring Suppl (ACCU-CHEK GUIDE) w/Device KIT  04/24/21   [provider]  clopidogrel  (PLAVIX ) 75 MG tablet Take 1 tablet (75 mg total) by mouth daily. 03/01/24   Fernand Denyse LABOR, MD  furosemide  (LASIX ) 20 MG tablet Take 1 tablet (20 mg total) by mouth daily. 03/01/24   Fernand Denyse A, MD  glucose blood (ACCU-CHEK GUIDE) test strip USE WITH DIABETIC METER TO TEST BLOOD SUGAR THREE TIMES DAILY 08/27/22   Fernand Fredy RAMAN, MD  isosorbide  mononitrate (IMDUR ) 30 MG 24 hr tablet TAKE 1 TABLET EVERY DAY 02/06/24   Fernand Denyse LABOR, MD  meclizine  (ANTIVERT ) 25 MG tablet Take 1 tablet (25 mg total) by mouth in the morning, at noon, in the evening, and at bedtime. TAKE 1 TABLET FOUR TIMES DAILY 04/18/23 04/12/24  Albina RAMAN Dine, MD  metFORMIN  (GLUCOPHAGE ) 500 MG tablet Take 1 tablet (500 mg total) by mouth every morning. 10/31/23   Albina RAMAN Dine, MD  metoprolol  succinate (TOPROL -XL) 25 MG 24 hr tablet Take 0.5 tablets (12.5 mg total) by mouth daily. 02/11/23   Alexander, Natalie, DO  nitroGLYCERIN  (NITROSTAT ) 0.4 MG SL tablet Place 0.4 mg under the tongue every 5 (five) minutes as needed for chest pain. 08/22/13   [provider]  pantoprazole  (PROTONIX ) 40 MG tablet Take 1 tablet (40 mg total) by mouth every morning. 10/28/23   Fernand Denyse LABOR, MD  PHENobarbital  (LUMINAL) 97.2 MG tablet Take 1 tablet (97.2 mg total) by mouth at bedtime. 10/31/23 10/30/24  Albina RAMAN Dine, MD  phenytoin  (DILANTIN ) 200 MG ER capsule Take 1 capsule (200 mg total) by mouth daily. Take with 1 capsule 30 mg dose for total daily dose 230 mg 10/31/23 10/30/24  Tejan-Sie, S Ahmed, MD  phenytoin  (DILANTIN ) 30 MG ER capsule Take 1 capsule (30 mg total) by mouth daily. Take with 1 capsule  200 mg dose for total daily dose 230 mg 10/07/23 04/04/24  Albina RAMAN Dine, MD  rosuvastatin  (CRESTOR ) 40 MG tablet Take 1 tablet (40 mg total) by mouth at bedtime. 03/01/24   Fernand Denyse LABOR, MD    Physical Exam: Vitals:   03/11/24 1200 03/11/24 1300 03/11/24 1400 03/11/24 1430  BP: 128/78 138/77 135/69 119/81  Pulse: 84 84 89 86  Resp: 19 18 15 16   Temp:      TempSrc:      SpO2: 95% 95% 98% 96%  Weight:      Height:       Constitutional:  Normal appearance.  Drowsy and weak appearing HENT: Head Normocephalic and atraumatic.  Mucous membranes are moist.  Eyes:  Extraocular intact. Conjunctivae normal. Pupils are equal, round, and  reactive to light.  Cardiovascular: Rate and Rhythm: Normal rate and regular rhythm.  Pulmonary: Non labored, symmetric rise of chest wall.  No significant crackles wheeze or rhonchi.  Nonrebreather in place. Musculoskeletal:  Normal range of motion.  Healing hematoma on the left lower shin Skin: warm and dry. not jaundiced.  Neurological: Drowsy but arousable when prompted.  A & O x 4 Psychiatric: Mood and Affect congruent.   Data Reviewed:    Latest Ref Rng & Units 03/11/2024   11:46 AM 01/27/2024    8:31 AM 01/04/2024    3:31 AM  CBC  WBC 4.0 - 10.5 K/uL 10.5  6.1  8.3   Hemoglobin 13.0 - 17.0 g/dL 87.9  87.7  88.5   Hematocrit 39.0 - 52.0 % 36.7  37.3  34.6   Platelets 150 - 400 K/uL 203  190  181       Latest Ref Rng & Units 03/11/2024   10:50 AM 01/27/2024    8:31 AM 01/04/2024    3:31 AM  BMP  Glucose 70 - 99 mg/dL 889  82  889   BUN 8 - 23 mg/dL 15  12  15    Creatinine 0.61 - 1.24 mg/dL 9.23  9.32  9.34   BUN/Creat Ratio 10 - 24  18    Sodium 135 - 145 mmol/L 140  134  137   Potassium 3.5 - 5.1 mmol/L 3.8  4.2  3.8   Chloride 98 - 111 mmol/L 106  98  104   CO2 22 - 32 mmol/L 21  20  24    Calcium  8.9 - 10.3 mg/dL 9.0  9.2  8.6    DG Chest 2 View CLINICAL DATA:  Pneumonia, fall.  EXAM: CHEST - 2 VIEW  COMPARISON:  09/18/2021,  03/11/2024.  FINDINGS: The heart is enlarged and the mediastinal contour is within normal limits. There is atherosclerotic calcification of the aorta. There is chronic elevation of the left diaphragm with strandy atelectasis or infiltrate at the lung bases. No effusion or pneumothorax is seen. Degenerative changes are present in the thoracic spine. No acute osseous abnormality  IMPRESSION: Strandy atelectasis or infiltrate at the lung bases.  Electronically Signed   By: [name] M.D.   On: 03/11/2024 14:16 CT Cervical Spine Wo Contrast CLINICAL DATA:  Neck trauma, fell  EXAM: CT CERVICAL SPINE WITHOUT CONTRAST  TECHNIQUE: Multidetector CT imaging of the cervical spine was performed without intravenous contrast. Multiplanar CT image reconstructions were also generated.  RADIATION DOSE REDUCTION: This exam was performed according to the departmental dose-optimization program which includes automated exposure control, adjustment of the mA and/or kV according to patient size and/or use of iterative reconstruction technique.  COMPARISON:  02/08/2023  FINDINGS: Alignment: Alignment is grossly anatomic.  Skull base and vertebrae: No acute fracture. No primary bone lesion or focal pathologic process.  Soft tissues and spinal canal: No prevertebral fluid or swelling. No visible canal hematoma.  Disc levels: Disc spaces are relatively well preserved. Mild diffuse facet hypertrophy. No bony encroachment upon the central canal or neural foramina.  Upper chest: Airway is patent. There is a trace right apical pneumothorax identified. No evidence of tension effect.  Other: Reconstructed images demonstrate no additional findings.  IMPRESSION: 1. Incidental trace right apical pneumothorax.  No tension effect. 2. No acute cervical spine fracture.  Critical Value/emergent results were called by telephone at the time of interpretation on 03/11/2024 at 1:23pm to provider NEHA  RAY , who verbally acknowledged these  results.  Electronically Signed   By: Ozell Daring M.D.   On: 03/11/2024 13:26 CT ANGIO GI BLEED CLINICAL DATA:  Right-sided abdominal pain.  Rectal bleeding.  Fall.  EXAM: CTA ABDOMEN AND PELVIS WITHOUT AND WITH CONTRAST  TECHNIQUE: Multidetector CT imaging of the abdomen and pelvis was performed using the standard protocol during bolus administration of intravenous contrast. Multiplanar reconstructed images and MIPs were obtained and reviewed to evaluate the vascular anatomy.  RADIATION DOSE REDUCTION: This exam was performed according to the departmental dose-optimization program which includes automated exposure control, adjustment of the mA and/or kV according to patient size and/or use of iterative reconstruction technique.  CONTRAST:  OMNIPAQUE  IOHEXOL  350 MG/ML SOLN  COMPARISON:  CT abdomen/pelvis without contrast 09/18/2021 and CT abdomen pelvis with contrast 05/07/2021  FINDINGS: VASCULAR  Aorta: Abdominal aorta is normal caliber. No evidence of aneurysm or dissection. Mild calcified plaque throughout the abdominal aorta.  Celiac: Mild plaque at its origin as the celiac artery is otherwise normal and patent.  SMA: Mild plaque at its origin as the superior mesenteric artery and branches are are patent.  Renals: Calcified plaque at the origin of the right renal artery which is otherwise patent. Left renal arteries patent. There is a small inferior accessory left renal artery.  IMA: Patent.  Inflow: Patent without significant stenosis or occlusion.  Proximal Outflow: Patent without significant stenosis or occlusion.  Veins: Unremarkable.  Review of the MIP images confirms the above findings.  NON-VASCULAR  Lower chest: Mild stable elevation of the left hemidiaphragm. Calcified plaque over the descending thoracic aorta. Borderline cardiomegaly. Moderate calcified plaque over the left anterior descending and  right coronary arteries. Evidence of prior CABG. Mild patchy linear density over the lung bases likely scarring/atelectasis. Minimal left basilar consolidation likely atelectasis although infection is possible. Mild heterogeneous density over the posterior right base which again is likely due to atelectasis although infection is possible. No significant pleural effusion.  Hepatobiliary: Liver, gallbladder and biliary tree are normal.  Pancreas: Normal.  Spleen: Normal.  Adrenals/Urinary Tract: Adrenal glands are normal. Kidneys are normal size without hydronephrosis or nephrolithiasis. Exophytic 4.4 cm left renal cyst without significant change. Ureters are normal. Bladder minimally distended with air over the nondependent portion of bladder likely due to recent instrumentation.  Stomach/Bowel: Stomach and small bowel are normal. Minimal diverticulosis of the colon. Appendix is normal. Cecum is somewhat high and in the midline to just right of midline. Note that there are a few foci of air along the anterior abdominal wall near the junction to the diaphragm as several of these appear to communicate with the lower lung as this is likely not free peritoneal air. No other findings to suggest free peritoneal air.  Lymphatic: No adenopathy.  Reproductive: Prostate is unremarkable.  Other: No free fluid or focal inflammatory change.  Musculoskeletal: Stable L4 compression fracture. No other focal abnormality.  IMPRESSION: 1. No acute findings in the abdomen/pelvis. No evidence of acute lower GI bleed source. 2. No evidence of aortic aneurysm or dissection. Mild atherosclerotic disease of the abdominal aorta and branches as described. 3. Minimal diverticulosis of the colon. 4. Stable 4.4 cm left renal cyst. 5. Stable L4 compression fracture. 6. Aortic atherosclerosis.  Aortic Atherosclerosis (ICD10-I70.0).  Electronically Signed   By: Toribio Agreste M.D.   On: 03/11/2024  13:11 CT Head Wo Contrast CLINICAL DATA:  Clemens, facial trauma  EXAM: CT HEAD WITHOUT CONTRAST  TECHNIQUE: Contiguous axial images were obtained from the base  of the skull through the vertex without intravenous contrast.  RADIATION DOSE REDUCTION: This exam was performed according to the departmental dose-optimization program which includes automated exposure control, adjustment of the mA and/or kV according to patient size and/or use of iterative reconstruction technique.  COMPARISON:  01/01/2024  FINDINGS: Brain: Focal hypodensity in the left basal ganglia/corona radiata consistent with evolution of the infarct seen on previous study. Chronic small vessel ischemic changes are seen elsewhere within the basal ganglia and periventricular white matter. No evidence of acute infarct or hemorrhage. Lateral ventricles and remaining midline structures are unremarkable. No acute extra-axial fluid collections. No mass effect.  Vascular: Stable atherosclerosis.  No hyperdense vessel.  Skull: Small left supraorbital scalp hematoma. No underlying fracture. The remainder of the calvarium is unremarkable.  Sinuses/Orbits: No acute finding.  Other: None.  IMPRESSION: 1. Chronic ischemic changes as above. No acute intracranial process.  Electronically Signed   By: Ozell Daring M.D.   On: 03/11/2024 13:09    Assessment and Plan: Trace apical pneumothorax - Incidentally seen on CT.  Clinically asymptomatic.  Maintaining 100% O2 sat on room air - Pulmonology consulted by EDP and recommended for chest CT and nonrebreather - Chest CT: Minimal right anterior pneumothorax, minimal displaced posterior right sixth rib fracture, bilateral posterior basilar airspace opacities with air bronchograms suspicious for pneumonia versus atelectasis.  Left lingular atelectasis or infiltrate.  Coronary artery calcifications - Patient has no fever, no leukocytosis, no cough.  Doubt pneumonia.  Will add  procalcitonin - Given he is wheelchair-bound at baseline and has progressive neurologic disorder he has high risk for atelectasis and pneumonia - Order incentive spirometry and flutter valve.  Generalized weakness, acute on chronic - Family reports he has seemed acutely weak over the last 4 days with associated poor appetite.  No fever - UA ordered and pending - Flu/COVID/RSV ordered and pending - PT/OT evals -- At baseline pt is A and O x3. Can transfer to wheelchair on his own. Lives with son Dale.  Lower GI bleed - Complaint of bright red blood per rectum occasionally seen on depends - Hemoglobin very stable at 12.0 - CTA GI bleed negative for acute bleeding - Hemoccult positive in ED - Trend CBC.  Given hemoglobin stability and multiple other issues at this time we will not consult gastroenterology as he would be a poor candidate for colonoscopy and bowel prep for now.  Status post fall 6th rib fracture - Nondisplaced rib fracture - Head CT without acute abnormality - PT/OT eval's  Generalized weakness Progressive neurologic disorder Wheelchair-bound at baseline sensorimotor polyneuropathy - Patient's disorder does not have true diagnosis but has been slow and progressive decline.  He self catheterizes and is wheelchair-bound at baseline.  He follows closely with neurology - Continue home meds  Urinary retention - Intermittent self-catheterization at home - Has known urinary colonization, given he denies any lower urinary tract symptoms at this time we will not treat findings on UA - Have ordered for intermittent catheterization while admitted  Seizure disorder - Continue home meds  CAD, history of prior CABG Carotid stenosis - New aspirin , Plavix , statin. - Gradually resume antihypertensives as BP tolerates  OSA - Hold CPAP given pneumothorax.  Continue with nonrebreather for now.  O2 sats currently fine.  Hypertension - Pressure currently low/normal.  Will hold  antihypertensives for now.  Gradually resume as tolerated  History of CVA - No acute changes on head CT - Continue home meds  Diabetes - Sliding scale  insulin  while admitted  L4 compression fracture, chronic - Stable.  Seen on CT.   Advance Care Planning:   Code Status: Prior no advance directive.  Patient endorses desire to have CPR and intubation in the event of cardiac or respiratory arrest.  Consults: Pulmonology  Family Communication: Patient's daughter, [name], at bedside.  Severity of Illness: The appropriate patient status for this patient is OBSERVATION. Observation status is judged to be reasonable and necessary in order to provide the required intensity of service to ensure the patient's safety. The patient's presenting symptoms, physical exam findings, and initial radiographic and laboratory data in the context of their medical condition is felt to place them at decreased risk for further clinical deterioration. Furthermore, it is anticipated that the patient will be medically stable for discharge from the hospital within 2 midnights of admission.   Author: Kanisha Duba, DO 03/11/2024 4:01 PM  For on call review [email or web address].

## 2024-03-11 NOTE — ED Provider Notes (Signed)
 [facility] Provider Note    Event Date/Time   First MD Initiated Contact with Patient 03/11/24 1053     (approximate)   History   poor appetite, Fall, and Rectal Bleeding   HPI  Edward Thompson is a 73 year old male with history of CAD, diabetes, stroke presenting to the emergency department for evaluation of weakness, fall, rectal bleeding.  Patient had a witnessed fall while getting out of bed this morning.  Wheelchair-bound at baseline.  Family does note that he seems weaker than his usual.  No nausea, vomiting, diarrhea.  Has had rectal bleeding for the past few days.  Family unsure amount has noticed blood in his depends.    Physical Exam   Triage Vital Signs: ED Triage Vitals  Encounter Vitals Group     BP 03/11/24 1049 (!) 119/59     Girls Systolic BP Percentile --      Girls Diastolic BP Percentile --      Boys Systolic BP Percentile --      Boys Diastolic BP Percentile --      Pulse Rate 03/11/24 1049 92     Resp 03/11/24 1049 18     Temp 03/11/24 1049 98.3 F (36.8 C)     Temp Source 03/11/24 1049 Oral     SpO2 03/11/24 1049 99 %     Weight 03/11/24 1048 194 lb 14.2 oz (88.4 kg)     Height 03/11/24 1048 5' 4 (1.626 m)     Head Circumference --      Peak Flow --      Pain Score 03/11/24 1044 5     Pain Loc --      Pain Education --      Exclude from Growth Chart --     Most recent vital signs: Vitals:   03/11/24 1400 03/11/24 1430  BP: 135/69 119/81  Pulse: 89 86  Resp: 15 16  Temp:    SpO2: 98% 96%     Nursing notes and vital signs reviewed.  General: Adult male, laying in bed, somnolent but arousable Head: Atraumatic Neck: No midline pain Chest: Symmetric chest rise, no appreciable tenderness to palpation.  Cardiac: Regular rhythm and rate.  Respiratory: Lungs clear to auscultation Abdomen: Soft, nondistended.  Mild generalized tenderness to palpation worse on the right side, mild red tinged blood on rectal exam,  Hemoccult negative, nonbleeding hemorrhoid noted MSK: No deformity to bilateral upper and lower extremity.  Neuro: Somnolent, answers some questions appropriately, no gross facial asymmetry Skin: No evidence of burns or lacerations.   ED Results / Procedures / Treatments   Labs (all labs ordered are listed, but only abnormal results are displayed) Labs Reviewed  COMPREHENSIVE METABOLIC PANEL WITH GFR - Abnormal; Notable for the following components:      Result Value   CO2 21 (*)    Glucose, Bld 110 (*)    All other components within normal limits  CBC - Abnormal; Notable for the following components:   RBC 4.03 (*)    Hemoglobin 12.0 (*)    HCT 36.7 (*)    All other components within normal limits  URINALYSIS, W/ REFLEX TO CULTURE (INFECTION SUSPECTED)  POC OCCULT BLOOD, ED  TYPE AND SCREEN  TROPONIN I (HIGH SENSITIVITY)     EKG EKG independently reviewed and interpreted by myself demonstrates:  EKG demonstrate sinus rhythm at a rate of 85, PR 166, QRS 133, QTc 514, right bundle and left anterior fascicular block  noted, no appreciable superimposed ischemia, no STEMI  RADIOLOGY Imaging independently reviewed and interpreted by myself demonstrates:  CT head without acute bleed CT C-spine without acute cervical fracture, did receive a call from radiology that inferior aspect of imaging does note a trace pneumothorax.  CT GI bleed protocol without active bleed or other acute intra-abdominal process.   Chest x-[name] without visible pneumothorax questionable atelectasis versus infiltrate noted  Formal Radiology Read:  DG Chest 2 View Result Date: 03/11/2024 CLINICAL DATA:  Pneumonia, fall. EXAM: CHEST - 2 VIEW COMPARISON:  09/18/2021, 03/11/2024. FINDINGS: The heart is enlarged and the mediastinal contour is within normal limits. There is atherosclerotic calcification of the aorta. There is chronic elevation of the left diaphragm with strandy atelectasis or infiltrate at the lung  bases. No effusion or pneumothorax is seen. Degenerative changes are present in the thoracic spine. No acute osseous abnormality IMPRESSION: Strandy atelectasis or infiltrate at the lung bases. Electronically Signed   By: [name] M.D.   On: 03/11/2024 14:16   CT Cervical Spine Wo Contrast Result Date: 03/11/2024 CLINICAL DATA:  Neck trauma, fell EXAM: CT CERVICAL SPINE WITHOUT CONTRAST TECHNIQUE: Multidetector CT imaging of the cervical spine was performed without intravenous contrast. Multiplanar CT image reconstructions were also generated. RADIATION DOSE REDUCTION: This exam was performed according to the departmental dose-optimization program which includes automated exposure control, adjustment of the mA and/or kV according to patient size and/or use of iterative reconstruction technique. COMPARISON:  02/08/2023 FINDINGS: Alignment: Alignment is grossly anatomic. Skull base and vertebrae: No acute fracture. No primary bone lesion or focal pathologic process. Soft tissues and spinal canal: No prevertebral fluid or swelling. No visible canal hematoma. Disc levels: Disc spaces are relatively well preserved. Mild diffuse facet hypertrophy. No bony encroachment upon the central canal or neural foramina. Upper chest: Airway is patent. There is a trace right apical pneumothorax identified. No evidence of tension effect. Other: Reconstructed images demonstrate no additional findings. IMPRESSION: 1. Incidental trace right apical pneumothorax.  No tension effect. 2. No acute cervical spine fracture. Critical Value/emergent results were called by telephone at the time of interpretation on 03/11/2024 at 1:23pm to provider [name] , who verbally acknowledged these results. Electronically Signed   By: Ozell Daring M.D.   On: 03/11/2024 13:26   CT ANGIO GI BLEED Result Date: 03/11/2024 CLINICAL DATA:  Right-sided abdominal pain.  Rectal bleeding.  Fall. EXAM: CTA ABDOMEN AND PELVIS WITHOUT AND WITH CONTRAST  TECHNIQUE: Multidetector CT imaging of the abdomen and pelvis was performed using the standard protocol during bolus administration of intravenous contrast. Multiplanar reconstructed images and MIPs were obtained and reviewed to evaluate the vascular anatomy. RADIATION DOSE REDUCTION: This exam was performed according to the departmental dose-optimization program which includes automated exposure control, adjustment of the mA and/or kV according to patient size and/or use of iterative reconstruction technique. CONTRAST:  OMNIPAQUE  IOHEXOL  350 MG/ML SOLN COMPARISON:  CT abdomen/pelvis without contrast 09/18/2021 and CT abdomen pelvis with contrast 05/07/2021 FINDINGS: VASCULAR Aorta: Abdominal aorta is normal caliber. No evidence of aneurysm or dissection. Mild calcified plaque throughout the abdominal aorta. Celiac: Mild plaque at its origin as the celiac artery is otherwise normal and patent. SMA: Mild plaque at its origin as the superior mesenteric artery and branches are are patent. Renals: Calcified plaque at the origin of the right renal artery which is otherwise patent. Left renal arteries patent. There is a small inferior accessory left renal artery. IMA: Patent. Inflow: Patent without significant  stenosis or occlusion. Proximal Outflow: Patent without significant stenosis or occlusion. Veins: Unremarkable. Review of the MIP images confirms the above findings. NON-VASCULAR Lower chest: Mild stable elevation of the left hemidiaphragm. Calcified plaque over the descending thoracic aorta. Borderline cardiomegaly. Moderate calcified plaque over the left anterior descending and right coronary arteries. Evidence of prior CABG. Mild patchy linear density over the lung bases likely scarring/atelectasis. Minimal left basilar consolidation likely atelectasis although infection is possible. Mild heterogeneous density over the posterior right base which again is likely due to atelectasis although infection is  possible. No significant pleural effusion. Hepatobiliary: Liver, gallbladder and biliary tree are normal. Pancreas: Normal. Spleen: Normal. Adrenals/Urinary Tract: Adrenal glands are normal. Kidneys are normal size without hydronephrosis or nephrolithiasis. Exophytic 4.4 cm left renal cyst without significant change. Ureters are normal. Bladder minimally distended with air over the nondependent portion of bladder likely due to recent instrumentation. Stomach/Bowel: Stomach and small bowel are normal. Minimal diverticulosis of the colon. Appendix is normal. Cecum is somewhat high and in the midline to just right of midline. Note that there are a few foci of air along the anterior abdominal wall near the junction to the diaphragm as several of these appear to communicate with the lower lung as this is likely not free peritoneal air. No other findings to suggest free peritoneal air. Lymphatic: No adenopathy. Reproductive: Prostate is unremarkable. Other: No free fluid or focal inflammatory change. Musculoskeletal: Stable L4 compression fracture. No other focal abnormality. IMPRESSION: 1. No acute findings in the abdomen/pelvis. No evidence of acute lower GI bleed source. 2. No evidence of aortic aneurysm or dissection. Mild atherosclerotic disease of the abdominal aorta and branches as described. 3. Minimal diverticulosis of the colon. 4. Stable 4.4 cm left renal cyst. 5. Stable L4 compression fracture. 6. Aortic atherosclerosis. Aortic Atherosclerosis (ICD10-I70.0). Electronically Signed   By: Toribio Agreste M.D.   On: 03/11/2024 13:11   CT Head Wo Contrast Result Date: 03/11/2024 CLINICAL DATA:  Clemens, facial trauma EXAM: CT HEAD WITHOUT CONTRAST TECHNIQUE: Contiguous axial images were obtained from the base of the skull through the vertex without intravenous contrast. RADIATION DOSE REDUCTION: This exam was performed according to the departmental dose-optimization program which includes automated exposure control,  adjustment of the mA and/or kV according to patient size and/or use of iterative reconstruction technique. COMPARISON:  01/01/2024 FINDINGS: Brain: Focal hypodensity in the left basal ganglia/corona radiata consistent with evolution of the infarct seen on previous study. Chronic small vessel ischemic changes are seen elsewhere within the basal ganglia and periventricular white matter. No evidence of acute infarct or hemorrhage. Lateral ventricles and remaining midline structures are unremarkable. No acute extra-axial fluid collections. No mass effect. Vascular: Stable atherosclerosis.  No hyperdense vessel. Skull: Small left supraorbital scalp hematoma. No underlying fracture. The remainder of the calvarium is unremarkable. Sinuses/Orbits: No acute finding. Other: None. IMPRESSION: 1. Chronic ischemic changes as above. No acute intracranial process. Electronically Signed   By: Ozell Daring M.D.   On: 03/11/2024 13:09    PROCEDURES:  Critical Care performed: No  Procedures   MEDICATIONS ORDERED IN ED: Medications  pantoprazole  (PROTONIX ) injection 40 mg (has no administration in time range)  iohexol  (OMNIPAQUE ) 350 MG/ML injection 100 mL (100 mLs Intravenous Contrast Given 03/11/24 1210)     IMPRESSION / MDM / ASSESSMENT AND PLAN / ED COURSE  I reviewed the triage vital signs and the nursing notes.  Differential diagnosis includes, but is not limited to, intracranial bleed, skull fracture, rib  fracture, pneumothorax, lower GI bleed, lower suspicion upper GI bleed, anemia, electrolyte abnormality  Patient's presentation is most consistent with acute presentation with potential threat to life or bodily function.  74 year old male presenting to the emergency department for evaluation after a fall with weakness in the setting of recent rectal bleeding.  CT head and C-spine without traumatic injuries of the head or neck.  Incidental trace pneumothorax noted on CT C-spine.  This is not visible on  chest x-Taydon Nasworthy that was obtained.  With recent rectal bleeding, CTA GI bleed protocol was also obtained.  Labs with stable anemia, normal white blood cell count, CMP reassuring.  Negative troponin.  with trace pneumothorax noted on CT, I did discuss the case with Dr. Malka with pulmonology.  He did recommend patient the patient on 100% supplemental oxygen, no indication for chest tube currently.  He will consult on the patient as an inpatient.  Does recommend CT of the chest for further evaluation.  CXR with questionable infiltrate, will defer on antibiotics until CT is obtained to further assess for this.  Hospitalist team paged.  Case reviewed with hospitalist team.  They will evaluate for anticipate admission.      FINAL CLINICAL IMPRESSION(S) / ED DIAGNOSES   Final diagnoses:  Rectal bleeding  Closed head injury, initial encounter  Traumatic pneumothorax, initial encounter     Rx / DC Orders   ED Discharge Orders     None        Note:  This document was prepared using Dragon voice recognition software and may include unintentional dictation errors.   Levander Slate, MD 03/11/24 [phone]

## 2024-03-11 NOTE — ED Notes (Signed)
 Bed alarm on, fall risk bracelet on, and nonskid shoes on. Call bell near pt

## 2024-03-12 ENCOUNTER — Observation Stay

## 2024-03-12 DIAGNOSIS — R9389 Abnormal findings on diagnostic imaging of other specified body structures: Secondary | ICD-10-CM | POA: Diagnosis not present

## 2024-03-12 DIAGNOSIS — K625 Hemorrhage of anus and rectum: Secondary | ICD-10-CM | POA: Diagnosis not present

## 2024-03-12 DIAGNOSIS — J939 Pneumothorax, unspecified: Secondary | ICD-10-CM | POA: Diagnosis not present

## 2024-03-12 DIAGNOSIS — J9811 Atelectasis: Secondary | ICD-10-CM | POA: Diagnosis not present

## 2024-03-12 DIAGNOSIS — S0990XA Unspecified injury of head, initial encounter: Secondary | ICD-10-CM | POA: Diagnosis not present

## 2024-03-12 LAB — COMPREHENSIVE METABOLIC PANEL WITH GFR
ALT: 8 U/L (ref 0–44)
AST: 11 U/L — ABNORMAL LOW (ref 15–41)
Albumin: 3.5 g/dL (ref 3.5–5.0)
Alkaline Phosphatase: 68 U/L (ref 38–126)
Anion gap: 11 (ref 5–15)
BUN: 13 mg/dL (ref 8–23)
CO2: 23 mmol/L (ref 22–32)
Calcium: 8.7 mg/dL — ABNORMAL LOW (ref 8.9–10.3)
Chloride: 107 mmol/L (ref 98–111)
Creatinine, Ser: 0.59 mg/dL — ABNORMAL LOW (ref 0.61–1.24)
GFR, Estimated: >60 mL/min (ref >60)
Glucose, Bld: 88 mg/dL (ref 70–99)
Potassium: 3.3 mmol/L — ABNORMAL LOW (ref 3.5–5.1)
Sodium: 141 mmol/L (ref 135–145)
Total Bilirubin: 1 mg/dL (ref 0.0–1.2)
Total Protein: 7.1 g/dL (ref 6.5–8.1)

## 2024-03-12 LAB — CBC WITH DIFFERENTIAL/PLATELET
Abs Immature Granulocytes: 0.04 K/uL (ref 0.00–0.07)
Basophils Absolute: 0 K/uL (ref 0.0–0.1)
Basophils Relative: 0 %
Eosinophils Absolute: 0.2 K/uL (ref 0.0–0.5)
Eosinophils Relative: 3 %
HCT: 33.2 % — ABNORMAL LOW (ref 39.0–52.0)
Hemoglobin: 10.6 g/dL — ABNORMAL LOW (ref 13.0–17.0)
Immature Granulocytes: 1 %
Lymphocytes Relative: 26 %
Lymphs Abs: 1.7 K/uL (ref 0.7–4.0)
MCH: 29 pg (ref 26.0–34.0)
MCHC: 31.9 g/dL (ref 30.0–36.0)
MCV: 90.7 fL (ref 80.0–100.0)
Monocytes Absolute: 0.7 K/uL (ref 0.1–1.0)
Monocytes Relative: 11 %
Neutro Abs: 4 K/uL (ref 1.7–7.7)
Neutrophils Relative %: 59 %
Platelets: 189 K/uL (ref 150–400)
RBC: 3.66 MIL/uL — ABNORMAL LOW (ref 4.22–5.81)
RDW: 15.1 % (ref 11.5–15.5)
WBC: 6.6 K/uL (ref 4.0–10.5)
nRBC: 0 % (ref 0.0–0.2)

## 2024-03-12 LAB — PHOSPHORUS: Phosphorus: 3.4 mg/dL (ref 2.5–4.6)

## 2024-03-12 LAB — MAGNESIUM: Magnesium: 1.9 mg/dL (ref 1.7–2.4)

## 2024-03-12 MED ORDER — CHLORHEXIDINE GLUCONATE CLOTH 2 % EX PADS
6.0000 | MEDICATED_PAD | Freq: Every day | CUTANEOUS | Status: DC
  Administered 2024-03-13: 6 via TOPICAL

## 2024-03-12 NOTE — Evaluation (Addendum)
 Physical Therapy Evaluation Patient Details Name: Edward Thompson MRN: [id number] DOB: Jul 16, 1950 Today's Date: 03/12/2024  History of Present Illness  Edward Thompson is a 73 y.o. male with medical history significant of CAD, epilepsy on phenobarbital  phenytoin , OSA, hypertension, memory loss, frequent falls and weakness, severe sensorimotor polyneuropathy, history of CVA, diabetes, who presents to the ED with worsening weakness s/p fall. CT cervical spine is without acute cervical changes but does note tiny apical pneumothorax  Clinical Impression  Pt is a pleasant 73 y.o male admitted due to a fall and pneumothorax. Pt uses a W/C at baseline and is modified independent with transfers to and from chair. Pt demonstrates unsafe/difficult transfer techniques and did well with new transfer technique taught by PT. He would benefit from continued skilled therapy services to maintain the function he currently has post CVA and improve functional capacity with transfers.      If plan is discharge home, recommend the following: A little help with walking and/or transfers   Can travel by private vehicle        Equipment Recommendations    Recommendations for Other Services       Functional Status Assessment Patient has had a recent decline in their functional status and demonstrates the ability to make significant improvements in function in a reasonable and predictable amount of time.     Precautions / Restrictions Precautions Precautions: Fall Recall of Precautions/Restrictions: Intact Restrictions Weight Bearing Restrictions Per Provider Order: No      Mobility  Bed Mobility Overal bed mobility: Needs Assistance Bed Mobility: Supine to Sit     Supine to sit: Supervision     General bed mobility comments: needs cuing to adjust bed positioning after transfer    Transfers Overall transfer level: Needs assistance Equipment used: None Transfers: Bed to chair/wheelchair/BSC    Stand pivot transfers: Modified independent (Device/Increase time)         General transfer comment: Pt is modified independent with his version of the transfer, in which he moves his hips away from the surface he is transferring to.    Ambulation/Gait               General Gait Details: NT - non-ambulatory at baseline  Stairs            Wheelchair Mobility     Tilt Bed    Modified Rankin (Stroke Patients Only)       Balance Overall balance assessment: Needs assistance Sitting-balance support: No upper extremity supported, Feet supported Sitting balance-Leahy Scale: Good         Standing balance comment: NT - needs assessment                             Pertinent Vitals/Pain Pain Assessment Pain Assessment: No/denies pain    Home Living Family/patient expects to be discharged to:: Private residence Living Arrangements: Children Available Help at Discharge: Family;Available PRN/intermittently Type of Home: House Home Access: Ramped entrance       Home Layout: One level Home Equipment: Grab bars - tub/shower;Shower seat;Transport chair Additional Comments: reports son and DiL help as able    Prior Function Prior Level of Function : Needs assist       Physical Assist : Mobility (physical) Mobility (physical): Transfers   Mobility Comments: reports MOD I w/c t/f ADLs Comments: assist for IADLs     Extremity/Trunk Assessment   Upper Extremity Assessment Upper Extremity Assessment: RUE deficits/detail  RUE Deficits / Details: decreased strength of right arm    Lower Extremity Assessment Lower Extremity Assessment: RLE deficits/detail RLE Deficits / Details: decreased strength of right LE    Cervical / Trunk Assessment Cervical / Trunk Assessment: Kyphotic  Communication   Communication Communication: Impaired Factors Affecting Communication: Reduced clarity of speech    Cognition Arousal: Alert Behavior During  Therapy: Flat affect, WFL for tasks assessed/performed                           PT - Cognition Comments: A&Ox4 Following commands: Intact       Cueing Cueing Techniques: Verbal cues     General Comments General comments (skin integrity, edema, etc.): LE edema    Exercises Other Exercises Other Exercises: transfer to/from bed to chair - emphasis on direction of transfer for safety. Pt educated on how to transfer so his hips move towards the surface he is trasnferring to. Performed 2 reps of new transfer technique, required Mod A due to change in technique. Transfers were completed to the left.   Assessment/Plan    PT Assessment Patient needs continued PT services  PT Problem List Decreased strength;Decreased range of motion;Decreased activity tolerance;Decreased balance;Decreased safety awareness;Decreased mobility       PT Treatment Interventions Functional mobility training;Therapeutic activities;Patient/family education    PT Goals (Current goals can be found in the Care Plan section)  Acute Rehab PT Goals Patient Stated Goal: to return home PT Goal Formulation: With patient Time For Goal Achievement: 03/26/24 Potential to Achieve Goals: Good    Frequency Min 1X/week     Co-evaluation               AM-PAC PT 6 Clicks Mobility  Outcome Measure Help needed turning from your back to your side while in a flat bed without using bedrails?: A Little Help needed moving from lying on your back to sitting on the side of a flat bed without using bedrails?: A Little Help needed moving to and from a bed to a chair (including a wheelchair)?: A Little Help needed standing up from a chair using your arms (e.g., wheelchair or bedside chair)?: A Little Help needed to walk in hospital room?: Total Help needed climbing 3-5 steps with a railing? : Total 6 Click Score: 14    End of Session Equipment Utilized During Treatment: Gait belt Activity Tolerance: Patient  tolerated treatment well Patient left: in bed;with call bell/phone within reach;with bed alarm set Nurse Communication: Mobility status PT Visit Diagnosis: Repeated falls (R29.6);Muscle weakness (generalized) (M62.81);History of falling (Z91.81);Other symptoms and signs involving the nervous system (R29.898)    Time: 8895-8875 PT Time Calculation (min) (ACUTE ONLY): 20 min   Charges:   PT Evaluation $PT Eval Low Complexity: 1 Low   PT General Charges $$ ACUTE PT VISIT: 1 Visit         Allena Bulls, SPT   Allena Bulls 03/12/2024, 1:40 PM

## 2024-03-12 NOTE — Plan of Care (Signed)

## 2024-03-12 NOTE — Evaluation (Signed)
 Occupational Therapy Evaluation Patient Details Name: Edward Thompson MRN: [id number] DOB: 10-Jun-1950 Today's Date: 03/12/2024   History of Present Illness   Edward Thompson is a 73 y.o. male with medical history significant of CAD, epilepsy on phenobarbital  phenytoin , OSA, hypertension, memory loss, frequent falls and weakness, severe sensorimotor polyneuropathy, history of CVA, diabetes, who presents to the ED with worsening weakness s/p fall. CT cervical spine is without acute cervical changes but does note tiny apical pneumothorax    Clinical Impressions Edward Thompson was seen for OT evaluation this date. Prior to hospital admission, pt was MOD I for w/c t/fs, reports x3 falls in last month. Pt lives with son and daughter in law. Pt currently requires MIN A don B shoes in sitting. CGA for ADL t/f bed>chair>bed>chair to simulate BSC and w/c home transfers. MOD I seated grooming tasks. Pt would benefit from skilled OT to address noted impairments and functional limitations (see below for any additional details). Upon hospital discharge, recommend OT follow up.     If plan is discharge home, recommend the following:   A little help with bathing/dressing/bathroom;Help with stairs or ramp for entrance     Functional Status Assessment   Patient has had a recent decline in their functional status and demonstrates the ability to make significant improvements in function in a reasonable and predictable amount of time.     Equipment Recommendations   BSC/3in1     Recommendations for Other Services         Precautions/Restrictions   Precautions Precautions: Fall Recall of Precautions/Restrictions: Intact Restrictions Weight Bearing Restrictions Per Provider Order: No     Mobility Bed Mobility Overal bed mobility: Needs Assistance Bed Mobility: Supine to Sit     Supine to sit: Supervision          Transfers Overall transfer level: Needs assistance Equipment used:  None Transfers: Bed to chair/wheelchair/BSC, Sit to/from Stand Sit to Stand: Contact guard assist   Squat pivot transfers: Contact guard assist              Balance Overall balance assessment: Needs assistance Sitting-balance support: No upper extremity supported, Feet supported Sitting balance-Leahy Scale: Good     Standing balance support: Bilateral upper extremity supported Standing balance-Leahy Scale: Fair                             ADL either performed or assessed with clinical judgement   ADL Overall ADL's : Needs assistance/impaired                                       General ADL Comments: MIN A don B shoes in sitting. CGA for ADL t/f bed>chair>bed>chair to simulate BSC and w/c home transfers. MOD I seated grooming tasks      Pertinent Vitals/Pain Pain Assessment Pain Assessment: No/denies pain     Extremity/Trunk Assessment Upper Extremity Assessment Upper Extremity Assessment: Overall WFL for tasks assessed   Lower Extremity Assessment Lower Extremity Assessment: Generalized weakness       Communication Communication Communication: Impaired Factors Affecting Communication: Reduced clarity of speech   Cognition Arousal: Alert Behavior During Therapy: Flat affect, WFL for tasks assessed/performed Cognition: No family/caregiver present to determine baseline             OT - Cognition Comments: oriented to self, location, situation - garbled speech  limiting assessment                 Following commands: Intact       Cueing  General Comments   Cueing Techniques: Verbal cues      Exercises     Shoulder Instructions      Home Living Family/patient expects to be discharged to:: Private residence Living Arrangements: Children Available Help at Discharge: Family;Available PRN/intermittently Type of Home: House Home Access: Ramped entrance     Home Layout: One level     Bathroom Shower/Tub:  Chief Strategy Officer: Standard     Home Equipment: Grab bars - tub/shower;Shower seat;Transport chair   Additional Comments: reports son and DiL help as able      Prior Functioning/Environment Prior Level of Function : Needs assist             Mobility Comments: reports MOD I w/c t/f ADLs Comments: assist for IADLs    OT Problem List: Decreased strength;Decreased activity tolerance;Impaired balance (sitting and/or standing)   OT Treatment/Interventions: Self-care/ADL training;Therapeutic exercise;Energy conservation;DME and/or AE instruction;Therapeutic activities;Patient/family education;Balance training      OT Goals(Current goals can be found in the care plan section)   Acute Rehab OT Goals Patient Stated Goal: to go home OT Goal Formulation: With patient Time For Goal Achievement: 03/26/24 Potential to Achieve Goals: Good ADL Goals Pt Will Perform Grooming: with modified independence;sitting Pt Will Perform Lower Body Dressing: with modified independence;sit to/from stand Pt Will Transfer to Toilet: with modified independence;squat pivot transfer;bedside commode   OT Frequency:  Min 2X/week    Co-evaluation              AM-PAC OT 6 Clicks Daily Activity     Outcome Measure Help from another person eating meals?: None Help from another person taking care of personal grooming?: A Little Help from another person toileting, which includes using toliet, bedpan, or urinal?: A Little Help from another person bathing (including washing, rinsing, drying)?: A Little Help from another person to put on and taking off regular upper body clothing?: None Help from another person to put on and taking off regular lower body clothing?: A Little 6 Click Score: 20   End of Session Equipment Utilized During Treatment: Gait belt Nurse Communication: Mobility status  Activity Tolerance: Patient tolerated treatment well Patient left: in chair;with call  bell/phone within reach;with chair alarm set (SLP in room)  OT Visit Diagnosis: Other abnormalities of gait and mobility (R26.89);Muscle weakness (generalized) (M62.81)                Time: 9063-9046 OT Time Calculation (min): 17 min Charges:  OT General Charges $OT Visit: 1 Visit OT Evaluation $OT Eval Low Complexity: 1 Low OT Treatments $Self Care/Home Management : 8-22 mins  Elston Slot, M.S. OTR/L  03/12/24, 10:36 AM  ascom [phone]

## 2024-03-12 NOTE — Progress Notes (Signed)
 PROGRESS NOTE    Edward Thompson  FMW:[id number] DOB: 19-Nov-1950 DOA: 03/11/2024 PCP: Albina GORMAN Dine, MD  Chief Complaint  Patient presents with   poor appetite   Fall   Rectal Bleeding    Hospital Course:   Edward Thompson is a 73 y.o. male with medical history significant of CAD, epilepsy on phenobarbital  phenytoin , OSA, hypertension, memory loss, frequent falls and weakness, severe sensorimotor polyneuropathy, history of CVA, diabetes, who presents to the ED with worsening weakness. Patient has been getting progressively weaker over the last many years.  He has been following with neurology outpatient.  Due to progressive weakness he is mostly wheelchair-bound and self catheterizes.  He does not have a true diagnosis of this condition but it appears to have some genetic component as he has multiple brothers with the same mobility issues. This morning patient had witnessed fall while getting out of bed, no reported loss of consciousness.  Family also reports intermittent rectal bleeding for the last few days which has been noted in his depends.   His daughter, [name], bedside reports he has frequent UTIs.  He currently denies any lower urinary tract symptoms.  No recent fever.  No coughing.On arrival to the ED labs and vitals are grossly unremarkable, hemoglobin 12.0.  Patient received pan scan.  CTA bleed scan negative for acute GI bleed.  CT head is notable for old infarct, CT cervical spine is without acute cervical changes but does note tiny apical pneumothorax.  Patient maintains 100% O2 sat on room air.  EDP consulted pulmonology who recommended nonrebreather and CT chest.  By reevaluation on 11/3 patient was at his mental and physical baseline.  He was able to transfer independently under PT and OT supervision.  He is maintaining O2 sats on room air.   Subjective: Patient is much more alert and oriented today.  He is now off of oxygen and breathing easily.  Denies  pain   Objective: Vitals:   03/11/24 2030 03/11/24 2101 03/12/24 0406 03/12/24 0803  BP: 118/63 (!) 119/104 (!) 109/57 (!) 113/57  Pulse: 78 79 86 87  Resp: 15 15 18 17   Temp:  98.4 F (36.9 C) 98.7 F (37.1 C) 98.3 F (36.8 C)  TempSrc:      SpO2: 100% 100% 100% 94%  Weight:      Height:        Intake/Output Summary (Last 24 hours) at 03/12/2024 1545 Last data filed at 03/12/2024 1500 Gross per 24 hour  Intake --  Output 675 ml  Net -675 ml   Filed Weights   03/11/24 1048  Weight: 88.4 kg    Examination: General exam: Appears calm and comfortable, NAD  Respiratory system: No work of breathing, symmetric chest wall expansion Cardiovascular system: S1 & S2 heard, RRR.  Gastrointestinal system: Abdomen is nondistended, soft and nontender.  Neuro: Alert and oriented.  Speech is weak but coherent. Psychiatry: Demonstrates appropriate judgement and insight  Assessment & Plan:  Principal Problem:   Pneumothorax Active Problems:   Closed head injury   Rectal bleeding    Trace apical pneumothorax - Incidentally seen on CT.  Clinically asymptomatic.  Maintaining 100% O2 sat on room air - Pulmonology consulted by EDP and recommended for chest CT and nonrebreather - Chest CT: Minimal right anterior pneumothorax, minimal displaced posterior right sixth rib fracture, bilateral posterior basilar airspace opacities with air bronchograms suspicious for pneumonia versus atelectasis.  Left lingular atelectasis or infiltrate.  Coronary artery calcifications - Patient  has no fever, no leukocytosis, no cough, procal <0.10.  Doubt pneumonia.   - Given he is wheelchair-bound at baseline and has progressive neurologic disorder he has high risk for atelectasis and pneumonia - Encourage incentive spirometry and flutter valve - Repeat CXR this a.m. without evidence of pneumothorax.  Patient maintaining O2 sats on room air  Generalized weakness, acute on chronic - Family reports he has  seemed acutely weak over the last 4 days with associated poor appetite.  No fever - UA some bacteria.  Patient denies any lower urinary tract symptoms and has history of colonization - Flu/COVID/RSV negative - PT/OT evals determined patient is at his physiologic baseline.  Patient agrees. -- At baseline pt is A and O x3. Can transfer to wheelchair on his own. Lives with son Dale.   Lower GI bleed - Complaint of bright red blood per rectum occasionally seen on depends - Hemoglobin very stable at 12.0 on arrival.  Some hemodilution today. - Continue to trend CBC - CTA GI bleed negative for acute bleeding - Hemoccult positive in ED - Given hemoglobin stability and multiple other comorbidities he is high risk for anesthesia.  Will not pursue gastroenterology consult at this time unless clinical picture changes.  Can consider outpatient follow-up for colonoscopy if desired   Status post fall 6th rib fracture - Nondisplaced rib fracture - Head CT without acute abnormality - PT/OT agree patient is currently at his baseline.  Will likely discharge home with home health   Generalized weakness Progressive neurologic disorder Wheelchair-bound at baseline sensorimotor polyneuropathy - Patient's disorder does not have true diagnosis but has been slow and progressive decline.  He self catheterizes and is wheelchair-bound at baseline.  He follows closely with neurology and has had extensive workup including EMGs, biopsies, genetic testing. - His daughter today reports he has never had a spinal MRI.  On chart review I do not easily find an MRI.  This may be due to his pacemaker, unclear if it is MRI compatible - For now we will continue with home meds - Many pacemakers are MRI compatible, can reach out to the patient's cardiologist to consider outpatient MRI and schedule around pacemaker needs.   Urinary retention - Intermittent self-catheterization at home - Has known urinary colonization, given he  denies any lower urinary tract symptoms at this time we will not treat findings on UA - Have ordered for intermittent catheterization while admitted   Seizure disorder - Continue home meds   CAD, history of prior CABG Carotid stenosis - Cont aspirin , Plavix , statin. - Gradually resume antihypertensives as BP tolerates   OSA - Hold CPAP given pneumothorax.  Continue with nonrebreather for now.  O2 sats currently fine.   Hypertension - Pressure currently low/normal.  Will hold antihypertensives for now.  Gradually resume as tolerated   History of CVA - No acute changes on head CT - Continue home meds   Diabetes - Sliding scale insulin  while admitted   L4 compression fracture, chronic - Stable.  Seen on CT.  DVT prophylaxis: Heparin    Code Status: Full Code Disposition: Observation.  Likely home tomorrow a.m. with home health  Consultants:    Procedures:    Antimicrobials:  Anti-infectives (From admission, onward)    None       Data Reviewed: I have personally reviewed following labs and imaging studies CBC: Recent Labs  Lab 03/11/24 1146 03/12/24 0526  WBC 10.4  10.5 6.6  NEUTROABS 7.9* 4.0  HGB 11.9*  12.0*  10.6*  HCT 36.8*  36.7* 33.2*  MCV 92.0  91.1 90.7  PLT 212  203 189   Basic Metabolic Panel: Recent Labs  Lab 03/11/24 1050 03/12/24 0526  NA 140 141  K 3.8 3.3*  CL 106 107  CO2 21* 23  GLUCOSE 110* 88  BUN 15 13  CREATININE 0.76 0.59*  CALCIUM  9.0 8.7*  MG  --  1.9  PHOS  --  3.4   GFR: Estimated Creatinine Clearance: 82.5 mL/min (A) (by C-G formula based on SCr of 0.59 mg/dL (L)). Liver Function Tests: Recent Labs  Lab 03/11/24 1050 03/12/24 0526  AST 19 11*  ALT 11 8  ALKPHOS 79 68  BILITOT 0.6 1.0  PROT 7.6 7.1  ALBUMIN 3.9 3.5   CBG: No results for input(s): GLUCAP in the last 168 hours.  Recent Results (from the past 240 hours)  MRSA Next Gen by PCR, Nasal     Status: None   Collection Time: 03/11/24  5:28  PM   Specimen: Nasal Mucosa; Nasal Swab  Result Value Ref Range Status   MRSA by PCR Next Gen NOT DETECTED NOT DETECTED Final    Comment: (NOTE) The GeneXpert MRSA Assay (FDA approved for NASAL specimens only), is one component of a comprehensive MRSA colonization surveillance program. It is not intended to diagnose MRSA infection nor to guide or monitor treatment for MRSA infections. Test performance is not FDA approved in patients less than 24 years old. Performed at [facility], [address] [place], [place] [address]   Resp panel by RT-PCR (RSV, Flu A&B, Covid) Anterior Nasal Swab     Status: None   Collection Time: 03/11/24  5:28 PM   Specimen: Anterior Nasal Swab  Result Value Ref Range Status   SARS Coronavirus 2 by RT PCR NEGATIVE NEGATIVE Final    Comment: (NOTE) SARS-CoV-2 target nucleic acids are NOT DETECTED.  The SARS-CoV-2 RNA is generally detectable in upper respiratory specimens during the acute phase of infection. The lowest concentration of SARS-CoV-2 viral copies this assay can detect is 138 copies/mL. A negative result does not preclude SARS-Cov-2 infection and should not be used as the sole basis for treatment or other patient management decisions. A negative result may occur with  improper specimen collection/handling, submission of specimen other than nasopharyngeal swab, presence of viral mutation(s) within the areas targeted by this assay, and inadequate number of viral copies(<138 copies/mL). A negative result must be combined with clinical observations, patient history, and epidemiological information. The expected result is Negative.  Fact Sheet for Patients:  bloggercourse.com  Fact Sheet for Healthcare Providers:  seriousbroker.it  This test is no t yet approved or cleared by the [place]  FDA and  has been authorized for detection and/or diagnosis of SARS-CoV-2 by FDA under an  Emergency Use Authorization (EUA). This EUA will remain  in effect (meaning this test can be used) for the duration of the COVID-19 declaration under Section 564(b)(1) of the Act, 21 U.S.C.section 360bbb-3(b)(1), unless the authorization is terminated  or revoked sooner.       Influenza A by PCR NEGATIVE NEGATIVE Final   Influenza B by PCR NEGATIVE NEGATIVE Final    Comment: (NOTE) The Xpert Xpress SARS-CoV-2/FLU/RSV plus assay is intended as an aid in the diagnosis of influenza from Nasopharyngeal swab specimens and should not be used as a sole basis for treatment. Nasal washings and aspirates are unacceptable for Xpert Xpress SARS-CoV-2/FLU/RSV testing.  Fact Sheet for Patients: bloggercourse.com  Fact Sheet for Healthcare Providers: seriousbroker.it  This test is not yet approved or cleared by the [place]  FDA and has been authorized for detection and/or diagnosis of SARS-CoV-2 by FDA under an Emergency Use Authorization (EUA). This EUA will remain in effect (meaning this test can be used) for the duration of the COVID-19 declaration under Section 564(b)(1) of the Act, 21 U.S.C. section 360bbb-3(b)(1), unless the authorization is terminated or revoked.     Resp Syncytial Virus by PCR NEGATIVE NEGATIVE Final    Comment: (NOTE) Fact Sheet for Patients: bloggercourse.com  Fact Sheet for Healthcare Providers: seriousbroker.it  This test is not yet approved or cleared by the [place]  FDA and has been authorized for detection and/or diagnosis of SARS-CoV-2 by FDA under an Emergency Use Authorization (EUA). This EUA will remain in effect (meaning this test can be used) for the duration of the COVID-19 declaration under Section 564(b)(1) of the Act, 21 U.S.C. section 360bbb-3(b)(1), unless the authorization is terminated or revoked.  Performed at [facility], [address] [place], [place] [address]      Radiology Studies: DG Chest [place] 1 View Result Date: 03/12/2024 EXAM: 1 VIEW(S) XRAY OF THE CHEST 03/12/2024 08:05:00 AM COMPARISON: PA and lateral radiographs of the chest dated 03/11/2024. CLINICAL HISTORY: Pneumothorax. FINDINGS: LUNGS AND PLEURA: Mild persistent elevation of the left hemidiaphragm with mild plate-like atelectasis again demonstrated in the left lung base. No focal pulmonary opacity. No pulmonary edema. No pleural effusion. No pneumothorax. HEART AND MEDIASTINUM: No acute abnormality of the cardiac and mediastinal silhouettes. BONES AND SOFT TISSUES: No acute osseous abnormality. IMPRESSION: 1. No acute findings. 2. Mild persistent elevation of the left hemidiaphragm with mild plate-like atelectasis in the left lung base. Electronically signed by: Evalene Coho MD 03/12/2024 08:11 AM EST RP Workstation: [id number]   CT Chest Wo Contrast Result Date: 03/11/2024 EXAM: CT CHEST WITHOUT CONTRAST 03/11/2024 03:44:25 PM TECHNIQUE: CT of the chest was performed without the administration of intravenous contrast. Multiplanar reformatted images are provided for review. Automated exposure control, iterative reconstruction, and/or weight based adjustment of the mA/kV was utilized to reduce the radiation dose to as low as reasonably achievable. COMPARISON: 05/09/2021 CLINICAL HISTORY: Chest trauma, blunt; small ptx on ct cervical spine, not appreciated on cxr. FINDINGS: MEDIASTINUM: Heart and pericardium are unremarkable. Coronary artery calcifications are noted. The central airways are clear. LYMPH NODES: No mediastinal, hilar or axillary lymphadenopathy. LUNGS AND PLEURA: Elevated left hemidiaphragm is noted. Minimal right pneumothorax is noted anteriorly. Bilateral posterior basilar opacities are noted with air bronchograms concerning for pneumonia or possibly atelectasis. Left lingular atelectasis or infiltrate is noted as well. No pleural  effusion. SOFT TISSUES/BONES: Minimally displaced fracture involving posterior portion of right sixth rib. UPPER ABDOMEN: Limited images of the upper abdomen demonstrates no acute abnormality. VASCULATURE: Aortic atherosclerosis. IMPRESSION: 1. Minimal right anterior pneumothorax. 2. Minimally displaced posterior right sixth rib fracture. 3. Bilateral posterior basilar airspace opacities with air bronchograms, suspicious for pneumonia versus atelectasis. 4. Left lingular atelectasis or infiltrate. 5. Aortic atherosclerosis. 6. Coronary artery calcifications. Electronically signed by: Lynwood Seip MD 03/11/2024 04:13 PM EST RP Workstation: [id number]   DG Chest 2 View Result Date: 03/11/2024 CLINICAL DATA:  Pneumonia, fall. EXAM: CHEST - 2 VIEW COMPARISON:  09/18/2021, 03/11/2024. FINDINGS: The heart is enlarged and the mediastinal contour is within normal limits. There is atherosclerotic calcification of the aorta. There is chronic elevation of the left diaphragm with strandy atelectasis or infiltrate at the lung bases. No  effusion or pneumothorax is seen. Degenerative changes are present in the thoracic spine. No acute osseous abnormality IMPRESSION: Strandy atelectasis or infiltrate at the lung bases. Electronically Signed   By: [name] M.D.   On: 03/11/2024 14:16   CT Cervical Spine Wo Contrast Result Date: 03/11/2024 CLINICAL DATA:  Neck trauma, fell EXAM: CT CERVICAL SPINE WITHOUT CONTRAST TECHNIQUE: Multidetector CT imaging of the cervical spine was performed without intravenous contrast. Multiplanar CT image reconstructions were also generated. RADIATION DOSE REDUCTION: This exam was performed according to the departmental dose-optimization program which includes automated exposure control, adjustment of the mA and/or kV according to patient size and/or use of iterative reconstruction technique. COMPARISON:  02/08/2023 FINDINGS: Alignment: Alignment is grossly anatomic. Skull base and vertebrae: No  acute fracture. No primary bone lesion or focal pathologic process. Soft tissues and spinal canal: No prevertebral fluid or swelling. No visible canal hematoma. Disc levels: Disc spaces are relatively well preserved. Mild diffuse facet hypertrophy. No bony encroachment upon the central canal or neural foramina. Upper chest: Airway is patent. There is a trace right apical pneumothorax identified. No evidence of tension effect. Other: Reconstructed images demonstrate no additional findings. IMPRESSION: 1. Incidental trace right apical pneumothorax.  No tension effect. 2. No acute cervical spine fracture. Critical Value/emergent results were called by telephone at the time of interpretation on 03/11/2024 at 1:23pm to provider NEHA RAY , who verbally acknowledged these results. Electronically Signed   By: Ozell Daring M.D.   On: 03/11/2024 13:26   CT ANGIO GI BLEED Result Date: 03/11/2024 CLINICAL DATA:  Right-sided abdominal pain.  Rectal bleeding.  Fall. EXAM: CTA ABDOMEN AND PELVIS WITHOUT AND WITH CONTRAST TECHNIQUE: Multidetector CT imaging of the abdomen and pelvis was performed using the standard protocol during bolus administration of intravenous contrast. Multiplanar reconstructed images and MIPs were obtained and reviewed to evaluate the vascular anatomy. RADIATION DOSE REDUCTION: This exam was performed according to the departmental dose-optimization program which includes automated exposure control, adjustment of the mA and/or kV according to patient size and/or use of iterative reconstruction technique. CONTRAST:  OMNIPAQUE  IOHEXOL  350 MG/ML SOLN COMPARISON:  CT abdomen/pelvis without contrast 09/18/2021 and CT abdomen pelvis with contrast 05/07/2021 FINDINGS: VASCULAR Aorta: Abdominal aorta is normal caliber. No evidence of aneurysm or dissection. Mild calcified plaque throughout the abdominal aorta. Celiac: Mild plaque at its origin as the celiac artery is otherwise normal and patent. SMA: Mild  plaque at its origin as the superior mesenteric artery and branches are are patent. Renals: Calcified plaque at the origin of the right renal artery which is otherwise patent. Left renal arteries patent. There is a small inferior accessory left renal artery. IMA: Patent. Inflow: Patent without significant stenosis or occlusion. Proximal Outflow: Patent without significant stenosis or occlusion. Veins: Unremarkable. Review of the MIP images confirms the above findings. NON-VASCULAR Lower chest: Mild stable elevation of the left hemidiaphragm. Calcified plaque over the descending thoracic aorta. Borderline cardiomegaly. Moderate calcified plaque over the left anterior descending and right coronary arteries. Evidence of prior CABG. Mild patchy linear density over the lung bases likely scarring/atelectasis. Minimal left basilar consolidation likely atelectasis although infection is possible. Mild heterogeneous density over the posterior right base which again is likely due to atelectasis although infection is possible. No significant pleural effusion. Hepatobiliary: Liver, gallbladder and biliary tree are normal. Pancreas: Normal. Spleen: Normal. Adrenals/Urinary Tract: Adrenal glands are normal. Kidneys are normal size without hydronephrosis or nephrolithiasis. Exophytic 4.4 cm left renal cyst without significant  change. Ureters are normal. Bladder minimally distended with air over the nondependent portion of bladder likely due to recent instrumentation. Stomach/Bowel: Stomach and small bowel are normal. Minimal diverticulosis of the colon. Appendix is normal. Cecum is somewhat high and in the midline to just right of midline. Note that there are a few foci of air along the anterior abdominal wall near the junction to the diaphragm as several of these appear to communicate with the lower lung as this is likely not free peritoneal air. No other findings to suggest free peritoneal air. Lymphatic: No adenopathy.  Reproductive: Prostate is unremarkable. Other: No free fluid or focal inflammatory change. Musculoskeletal: Stable L4 compression fracture. No other focal abnormality. IMPRESSION: 1. No acute findings in the abdomen/pelvis. No evidence of acute lower GI bleed source. 2. No evidence of aortic aneurysm or dissection. Mild atherosclerotic disease of the abdominal aorta and branches as described. 3. Minimal diverticulosis of the colon. 4. Stable 4.4 cm left renal cyst. 5. Stable L4 compression fracture. 6. Aortic atherosclerosis. Aortic Atherosclerosis (ICD10-I70.0). Electronically Signed   By: Toribio Agreste M.D.   On: 03/11/2024 13:11   CT Head Wo Contrast Result Date: 03/11/2024 CLINICAL DATA:  [place], facial trauma EXAM: CT HEAD WITHOUT CONTRAST TECHNIQUE: Contiguous axial images were obtained from the base of the skull through the vertex without intravenous contrast. RADIATION DOSE REDUCTION: This exam was performed according to the departmental dose-optimization program which includes automated exposure control, adjustment of the mA and/or kV according to patient size and/or use of iterative reconstruction technique. COMPARISON:  01/01/2024 FINDINGS: Brain: Focal hypodensity in the left basal ganglia/corona radiata consistent with evolution of the infarct seen on previous study. Chronic small vessel ischemic changes are seen elsewhere within the basal ganglia and periventricular white matter. No evidence of acute infarct or hemorrhage. Lateral ventricles and remaining midline structures are unremarkable. No acute extra-axial fluid collections. No mass effect. Vascular: Stable atherosclerosis.  No hyperdense vessel. Skull: Small left supraorbital scalp hematoma. No underlying fracture. The remainder of the calvarium is unremarkable. Sinuses/Orbits: No acute finding. Other: None. IMPRESSION: 1. Chronic ischemic changes as above. No acute intracranial process. Electronically Signed   By: Ozell Daring M.D.   On:  03/11/2024 13:09    Scheduled Meds:  aspirin  EC  81 mg Oral QHS   clopidogrel   75 mg Oral Daily   furosemide   20 mg Oral Daily   heparin   5,000 Units Subcutaneous Q8H   PHENobarbital   97.2 mg Oral QHS   phenytoin   200 mg Oral Daily   phenytoin   30 mg Oral Daily   rosuvastatin   40 mg Oral QHS   Continuous Infusions:   LOS: 0 days  MDM: Patient is high risk for one or more organ failure.  They necessitate ongoing hospitalization for continued IV therapies and subsequent lab monitoring. Total time spent interpreting labs and vitals, reviewing the medical record, coordinating care amongst consultants and care team members, directly assessing and discussing care with the patient and/or family: 55 min  Lilya Smitherman, DO [facility]  To contact the attending physician between 7A-7P please use Epic Chat. To contact the covering physician during after hours 7P-7A, please review Amion.  03/12/2024, 3:45 PM   *This document has been created with the assistance of dictation software. Please excuse typographical errors. *

## 2024-03-12 NOTE — Progress Notes (Signed)
 Care RN did bladder scan on patient,  result showed  .Per MD orders in and out cath every 6 hours for neurogenic bladder.Care RN did in and out at this time but no urine came out.Patient is on npo.Care RN informed night MD and care RN  recommend IV fluid cause patient might be dehydrated.No new order given at this time. Patient is currently resting.Bed locked in lowest position.Call bell in reach.
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
## 2024-03-12 NOTE — Care Management Obs Status (Signed)
 MEDICARE OBSERVATION STATUS NOTIFICATION   Patient Details  Name: Edward Thompson MRN: [id number] Date of Birth: 27-Feb-1951   Medicare Observation Status Notification Given:  Yes    [name] 03/12/2024, 12:38 PM

## 2024-03-12 NOTE — Evaluation (Signed)
 Clinical/Bedside Swallow Evaluation Patient Details  Name: Edward Thompson MRN: [id number] Date of Birth: 26-Mar-1951  Today's Date: 03/12/2024 Time: SLP Start Time (ACUTE ONLY): 0950 SLP Stop Time (ACUTE ONLY): 1005 SLP Time Calculation (min) (ACUTE ONLY): 15 min  Past Medical History:  Past Medical History:  Diagnosis Date   CAD (coronary artery disease)    Carotid stenosis, left 08/2017   Diabetes mellitus without complication (HCC)    Foot drop    Fracture of neck (HCC) 2008   fell off a roof. required halo x 4 months. also fractured alot of vertebrae   Heart attack (HCC) 2015   Heart disease    Hepatitis    6th grade    Hyperlipidemia    Hypertension    Myocardial infarction acute (HCC) 2015   Seizures (HCC)    taking phenobarbitol and dilantin . LAST SEIZURE WAS 2016. well controlled on meds   Sleep apnea    USES CPAP   Stroke (HCC) 2025   R hemiparesis and speech affected   Syncope 2019   Vertigo    Viral meningitis    Past Surgical History:  Past Surgical History:  Procedure Laterality Date   CARDIAC SURGERY     COLONOSCOPY     COLONOSCOPY WITH PROPOFOL  N/A 03/11/2021   Procedure: COLONOSCOPY WITH PROPOFOL ;  Surgeon: Toledo, Ladell POUR, MD;  Location: [facility] ENDOSCOPY;  Service: Gastroenterology;  Laterality: N/A;   CORONARY ANGIOPLASTY     PATIENT UNAWARE OF THIS   CORONARY ARTERY BYPASS GRAFT  2015   ENDARTERECTOMY Left 08/19/2017   Procedure: ENDARTERECTOMY CAROTID;  Surgeon: Jama Cordella MATSU, MD;  Location: [facility] ORS;  Service: Vascular;  Laterality: Left;   ESOPHAGOGASTRODUODENOSCOPY N/A 03/11/2021   Procedure: ESOPHAGOGASTRODUODENOSCOPY (EGD);  Surgeon: Toledo, Ladell POUR, MD;  Location: [facility] ENDOSCOPY;  Service: Gastroenterology;  Laterality: N/A;  DM   HERNIA REPAIR Right 1985   inguinal   HPI:  Per H&P, Edward Thompson is a 73 y.o. male with medical history significant of CAD, epilepsy on phenobarbital  phenytoin , OSA, hypertension, memory loss, frequent  falls and weakness, severe sensorimotor polyneuropathy, history of CVA, diabetes, who presents to the ED with worsening weakness.   Chest CT 03/11/24: Elevated left hemidiaphragm is noted. Minimal right pneumothorax is noted anteriorly. Bilateral posterior basilar opacities are noted with air bronchograms concerning for pneumonia or possibly atelectasis. Left lingular atelectasis or infiltrate is noted as well. No pleural effusion.  CXR 03/12/24: No acute findings. Mild persistent elevation of the left hemidiaphragm with mild plate-like atelectasis in the left lung base.   Assessment / Plan / Recommendation  Clinical Impression  Pt seen for bedside swallow assessment in the setting of concern for aspiration PNA. Pt known to SLP service, with recent intervention in Aug 2025 following acute CVA (at the time recommending minced solids and thin liquids). At time of this evaluation, right sided facial weakness persists, though with adequate seal and movement with activation. Pt reports improved labial seal since August. Pt seen with trials of thin liquids (via straw), puree, and regular solids, with pt self feeding t/o trials. No overt or subtle s/sx pharyngeal dysphagia noted. No change to vocal quality across trials. Oral phase mildly prolonged related to residual oral motor weakness, with eventual oral clearance facilitated with liquid wash.   Based on debility, hx of CVA/dysphagia, and acute deconditioning, pt is at increased risk of aspiration. However risk is managed with general aspiration precautions, (slow rate, small bites, elevated HOB, and alert for PO intake).  To aid oral manipulation, recommend mech soft solids and thin liquids. Suspect that current presentation closely aligns with pt's baseline oropharyngeal function, no further SLP services indicated. MD and RN aware of recommendations.   SLP Visit Diagnosis: Dysphagia, unspecified (R13.10)    Aspiration Risk  Mild aspiration risk    Diet  Recommendation   Thin;Dysphagia 3 (mechanical soft)  Medication Administration: Whole meds with liquid (vs puree as needed)    Other  Recommendations Oral Care Recommendations: Oral care BID (staff to assist)     Assistance Recommended at Discharge  SET UP for meals and intermittent supervision   Functional Status Assessment Patient has not had a recent decline in their functional status    Swallow Study   General Date of Onset: 03/12/24 HPI: Per H&P, Edward Thompson is a 73 y.o. male with medical history significant of CAD, epilepsy on phenobarbital  phenytoin , OSA, hypertension, memory loss, frequent falls and weakness, severe sensorimotor polyneuropathy, history of CVA, diabetes, who presents to the ED with worsening weakness. Type of Study: Bedside Swallow Evaluation Previous Swallow Assessment: Last seen by SLP on 01/04/24 with recommendation for minced/chopped soilds and thin liquids Diet Prior to this Study: NPO Temperature Spikes Noted: No Respiratory Status: Room air History of Recent Intubation: No Behavior/Cognition: Alert;Cooperative;Pleasant mood Oral Cavity Assessment: Within Functional Limits Oral Care Completed by SLP: Recent completion by staff Oral Cavity - Dentition: Adequate natural dentition Vision: Functional for self-feeding Self-Feeding Abilities: Able to feed self;Needs set up Patient Positioning: Upright in chair Baseline Vocal Quality: Low vocal intensity (near baseline) Volitional Cough: Strong Volitional Swallow: Able to elicit    Oral/Motor/Sensory Function Overall Oral Motor/Sensory Function: Mild impairment Facial ROM: Reduced right Facial Symmetry: Abnormal symmetry right Facial Strength: Reduced right Facial Sensation: Within Functional Limits Lingual ROM: Within Functional Limits Lingual Symmetry: Within Functional Limits Lingual Strength: Within Functional Limits Lingual Sensation: Within Functional Limits Velum: Within Functional  Limits Mandible: Within Functional Limits   Ice Chips Ice chips: Not tested   Thin Liquid Thin Liquid: Within functional limits Presentation: Straw    Nectar Thick Nectar Thick Liquid: Not tested   Honey Thick Honey Thick Liquid: Not tested   Puree Puree: Within functional limits Presentation: Spoon;Self Fed   Solid     Solid: Impaired Presentation: Self Fed Oral Phase Impairments: Impaired mastication Oral Phase Functional Implications: Prolonged oral transit Pharyngeal Phase Impairments:  (none)     Nobie Alleyne Clapp, MS, CCC-SLP Speech Language Pathologist Rehab Services; [facility] [phone] (ascom)   Portia Wisdom J Clapp 03/12/2024,11:37 AM

## 2024-03-13 ENCOUNTER — Observation Stay

## 2024-03-13 DIAGNOSIS — N319 Neuromuscular dysfunction of bladder, unspecified: Secondary | ICD-10-CM | POA: Diagnosis not present

## 2024-03-13 DIAGNOSIS — S0990XA Unspecified injury of head, initial encounter: Secondary | ICD-10-CM | POA: Diagnosis not present

## 2024-03-13 DIAGNOSIS — M4856XA Collapsed vertebra, not elsewhere classified, lumbar region, initial encounter for fracture: Secondary | ICD-10-CM | POA: Diagnosis not present

## 2024-03-13 DIAGNOSIS — M40204 Unspecified kyphosis, thoracic region: Secondary | ICD-10-CM | POA: Diagnosis not present

## 2024-03-13 DIAGNOSIS — M48061 Spinal stenosis, lumbar region without neurogenic claudication: Secondary | ICD-10-CM | POA: Diagnosis not present

## 2024-03-13 DIAGNOSIS — M5134 Other intervertebral disc degeneration, thoracic region: Secondary | ICD-10-CM | POA: Diagnosis not present

## 2024-03-13 DIAGNOSIS — M47814 Spondylosis without myelopathy or radiculopathy, thoracic region: Secondary | ICD-10-CM | POA: Diagnosis not present

## 2024-03-13 DIAGNOSIS — D1809 Hemangioma of other sites: Secondary | ICD-10-CM | POA: Diagnosis not present

## 2024-03-13 LAB — CBC WITH DIFFERENTIAL/PLATELET
Abs Immature Granulocytes: 0.03 K/uL (ref 0.00–0.07)
Basophils Absolute: 0 K/uL (ref 0.0–0.1)
Basophils Relative: 0 %
Eosinophils Absolute: 0.2 K/uL (ref 0.0–0.5)
Eosinophils Relative: 3 %
HCT: 34.1 % — ABNORMAL LOW (ref 39.0–52.0)
Hemoglobin: 10.9 g/dL — ABNORMAL LOW (ref 13.0–17.0)
Immature Granulocytes: 0 %
Lymphocytes Relative: 24 %
Lymphs Abs: 1.7 K/uL (ref 0.7–4.0)
MCH: 29.3 pg (ref 26.0–34.0)
MCHC: 32 g/dL (ref 30.0–36.0)
MCV: 91.7 fL (ref 80.0–100.0)
Monocytes Absolute: 0.8 K/uL (ref 0.1–1.0)
Monocytes Relative: 11 %
Neutro Abs: 4.4 K/uL (ref 1.7–7.7)
Neutrophils Relative %: 62 %
Platelets: 180 K/uL (ref 150–400)
RBC: 3.72 MIL/uL — ABNORMAL LOW (ref 4.22–5.81)
RDW: 15.2 % (ref 11.5–15.5)
WBC: 7.1 K/uL (ref 4.0–10.5)
nRBC: 0 % (ref 0.0–0.2)

## 2024-03-13 LAB — COMPREHENSIVE METABOLIC PANEL WITH GFR
ALT: 8 U/L (ref 0–44)
AST: 11 U/L — ABNORMAL LOW (ref 15–41)
Albumin: 3.1 g/dL — ABNORMAL LOW (ref 3.5–5.0)
Alkaline Phosphatase: 60 U/L (ref 38–126)
Anion gap: 11 (ref 5–15)
BUN: 18 mg/dL (ref 8–23)
CO2: 23 mmol/L (ref 22–32)
Calcium: 8.7 mg/dL — ABNORMAL LOW (ref 8.9–10.3)
Chloride: 107 mmol/L (ref 98–111)
Creatinine, Ser: 0.57 mg/dL — ABNORMAL LOW (ref 0.61–1.24)
GFR, Estimated: >60 mL/min (ref >60)
Glucose, Bld: 95 mg/dL (ref 70–99)
Potassium: 3.3 mmol/L — ABNORMAL LOW (ref 3.5–5.1)
Sodium: 141 mmol/L (ref 135–145)
Total Bilirubin: 0.8 mg/dL (ref 0.0–1.2)
Total Protein: 6.6 g/dL (ref 6.5–8.1)

## 2024-03-13 LAB — PHOSPHORUS: Phosphorus: 3 mg/dL (ref 2.5–4.6)

## 2024-03-13 LAB — MAGNESIUM: Magnesium: 1.9 mg/dL (ref 1.7–2.4)

## 2024-03-13 MED ORDER — POTASSIUM CHLORIDE CRYS ER 20 MEQ PO TBCR
40.0000 meq | EXTENDED_RELEASE_TABLET | Freq: Once | ORAL | Status: AC
  Administered 2024-03-13: 40 meq via ORAL
  Filled 2024-03-13: qty 2

## 2024-03-13 MED ORDER — GADOBUTROL 1 MMOL/ML IV SOLN
8.0000 mL | Freq: Once | INTRAVENOUS | Status: AC | PRN
  Administered 2024-03-13: 8 mL via INTRAVENOUS

## 2024-03-13 NOTE — TOC Progression Note (Signed)
 Transition of Care [facility]) - Progression Note    Patient Details  Name: Edward Thompson MRN: [id number] Date of Birth: [birth year]  [facility]) CM/SW Contact  Racheal LITTIE Schimke, RN Phone Number: 03/13/2024, 1:46 PM  Clinical Narrative: [name] with [facility] HH representative Larraine about providing HH services, she confirms that they will be able to service patient per orders.                      Expected Discharge Plan and Services         Expected Discharge Date: 03/13/24                                     Social Drivers of Health (SDOH) Interventions SDOH Screenings   Food Insecurity: No Food Insecurity (03/11/2024)  Housing: Low Risk  (03/11/2024)  Transportation Needs: No Transportation Needs (03/11/2024)  Utilities: Not At Risk (03/11/2024)  Financial Resource Strain: Low Risk  (02/12/2023)   Received from [facility]  Social Connections: Socially Isolated (03/11/2024)  Tobacco Use: Low Risk  (03/11/2024)    Readmission Risk Interventions    08/07/2021    9:31 AM  Readmission Risk Prevention Plan  Transportation Screening Complete  PCP or Specialist Appt within 5-7 Days Complete  Home Care Screening Complete  Medication Review (RN CM) Complete

## 2024-03-13 NOTE — Progress Notes (Signed)
 I have reviewed and concur with this student's documentation and MAR documentation.   Charmaine LITTIE Reiter, RN 03/13/2024 3:00 PM

## 2024-03-13 NOTE — Discharge Summary (Signed)
 DISCHARGE SUMMARY    Edward Thompson FMW:[id number] DOB: [birth year] DOA: 03/11/2024  PCP: Albina GORMAN Dine, MD  Admit date: 03/11/2024 Discharge date: 03/13/2024   Recommendations for Outpatient Follow-up:  Follow up with PCP in 1-2 weeks to review chronic condition management.  Hospital Course: Edward Thompson is a 73 y.o. male with medical history significant of CAD, epilepsy on phenobarbital  phenytoin , OSA, hypertension, memory loss, frequent falls and weakness, severe sensorimotor polyneuropathy, history of CVA, diabetes, who presents to the ED with worsening weakness. Patient has been getting progressively weaker over the last many years.  He has been following with neurology outpatient.  Due to progressive weakness he is mostly wheelchair-bound and self catheterizes.  He does not have a true diagnosis of this condition but it appears to have some genetic component as he has multiple brothers with the same mobility issues. The morning of arrival patient had witnessed fall while getting out of bed, no reported loss of consciousness.  Family also reports intermittent rectal bleeding for the last few days which has been noted in his depends.   His daughter, [name], reports he has frequent UTIs.  He currently denies any lower urinary tract symptoms.  No recent fever.  No coughing.On arrival to the ED labs and vitals are grossly unremarkable, hemoglobin 12.0.  Patient received pan scan.  CTA bleed scan negative for acute GI bleed.  CT head is notable for old infarct, CT cervical spine is without acute cervical changes but does note tiny apical pneumothorax.  Patient maintains 100% O2 sat on room air.  EDP consulted pulmonology who recommended nonrebreather and CT chest.  By reevaluation on 11/3 patient was at his mental and physical baseline.  He was able to transfer independently under PT and OT supervision.  He is maintaining O2 sats on room air.  Patient did have some intermittent delirium  which was resolved on day of discharge. Given his generalized weakness which appears to be worsening, we performed spinal MRI which was unremarkable.    Trace apical pneumothorax - Incidentally seen on CT.  Clinically asymptomatic.  Maintaining 100% O2 sat on room air - Pulmonology consulted by EDP - Chest CT: Minimal right anterior pneumothorax, minimal displaced posterior right sixth rib fracture, bilateral posterior basilar airspace opacities with air bronchograms suspicious for pneumonia versus atelectasis.  Left lingular atelectasis or infiltrate.  Coronary artery calcifications - Patient has no fever, no leukocytosis, no cough, procal <0.10.  Doubt pneumonia.   - Given he is wheelchair-bound at baseline and has progressive neurologic disorder he has high risk for atelectasis and pneumonia - Encourage incentive spirometry and flutter valve - Repeat CXR without evidence of pneumothorax.  Patient is maintaining O2 sats on room air.   Generalized weakness, acute on chronic - Family reports he has seemed acutely weak over the last 4 days with associated poor appetite.  No fever - UA some bacteria.  Patient denies any lower urinary tract symptoms and has history of colonization - Flu/COVID/RSV negative - PT/OT evals determined patient is at his physiologic baseline.  Patient agrees. -- At baseline pt is A and O x3. Can transfer to wheelchair on his own. Lives with son Dale.   Lower GI bleed - Complaint of bright red blood per rectum occasionally seen on depends - Hemoglobin very stable at 12.0 on arrival.  Some hemodilution today. - Continue to trend CBC - CTA GI bleed negative for acute bleeding - Hemoccult positive in ED - Given hemoglobin stability and multiple  other comorbidities he is high risk for anesthesia.  Will not pursue gastroenterology consult at this time unless clinical picture changes.  Can consider outpatient follow-up for colonoscopy if desired   Status post fall 6th rib  fracture - Nondisplaced rib fracture - Head CT without acute abnormality - PT/OT agree patient is currently at his baseline.  Will discharge home with home health   Generalized weakness Progressive neurologic disorder Wheelchair-bound at baseline sensorimotor polyneuropathy - Patient's disorder does not have true diagnosis but has been slow and progressive decline.  He self catheterizes and is wheelchair-bound at baseline.  He follows closely with neurology and has had extensive workup including EMGs, biopsies, genetic testing. - Lumbar and thoracic MRI unremarkable. (Earlier concern of pacemaker in error.  Patient does not have pacemaker.)   Urinary retention - Intermittent self-catheterization at home - Has known urinary colonization, given he denies any lower urinary tract symptoms at this time we will not treat findings on UA   Seizure disorder - Continue home meds   CAD, history of prior CABG Carotid stenosis - Cont aspirin , Plavix , statin.  OSA   Hypertension History of CVA - No acute changes on head CT - Continue home meds   Diabetes  resume home meds at DC.  L4 compression fracture, chronic - Stable.  Seen on CT.  Discharge Instructions  Discharge Instructions     Call MD for:  difficulty breathing, headache or visual disturbances   Complete by: As directed    Call MD for:  persistant dizziness or light-headedness   Complete by: As directed    Call MD for:  persistant nausea and vomiting   Complete by: As directed    Call MD for:  severe uncontrolled pain   Complete by: As directed    Call MD for:  temperature >100.4   Complete by: As directed    Diet general   Complete by: As directed    Discharge instructions   Complete by: As directed    While you were here your blood pressure was too low to tolerate some of your blood pressure medications.  We have recommended holding metoprolol  and Imdur  until your blood pressure rises.  If your blood pressure drops  too low this increases your risk of falling. Please keep a blood pressure log.  Follow-up with your primary care physician to review this log and determine what your blood pressure medications should be.  Please follow-up with neurology as we discussed and consider spinal MRI   Increase activity slowly   Complete by: As directed       Allergies as of 03/13/2024   No Known Allergies      Medication List     PAUSE taking these medications    isosorbide  mononitrate 30 MG 24 hr tablet Wait to take this until your doctor or other care provider tells you to start again. Commonly known as: IMDUR  TAKE 1 TABLET EVERY DAY   metoprolol  succinate 25 MG 24 hr tablet Wait to take this until your doctor or other care provider tells you to start again. Commonly known as: TOPROL -XL Take 0.5 tablets (12.5 mg total) by mouth daily.       TAKE these medications    Accu-Chek Guide test strip Generic drug: glucose blood USE WITH DIABETIC METER TO TEST BLOOD SUGAR THREE TIMES DAILY   Accu-Chek Guide w/Device Kit   Accu-Chek Softclix Lancets lancets TEST BLOOD SUGAR THREE TIMES DAILY   acetaminophen  500 MG tablet Commonly known as:  TYLENOL  Take 500 mg by mouth every 6 (six) hours as needed.   aspirin  EC 81 MG tablet Take 81 mg by mouth at bedtime.   clopidogrel  75 MG tablet Commonly known as: PLAVIX  Take 1 tablet (75 mg total) by mouth daily.   furosemide  20 MG tablet Commonly known as: LASIX  Take 1 tablet (20 mg total) by mouth daily.   meclizine  25 MG tablet Commonly known as: ANTIVERT  Take 1 tablet (25 mg total) by mouth in the morning, at noon, in the evening, and at bedtime. TAKE 1 TABLET FOUR TIMES DAILY   metFORMIN  500 MG tablet Commonly known as: GLUCOPHAGE  Take 1 tablet (500 mg total) by mouth every morning.   nitroGLYCERIN  0.4 MG SL tablet Commonly known as: NITROSTAT  Place 0.4 mg under the tongue every 5 (five) minutes as needed for chest pain.   pantoprazole   40 MG tablet Commonly known as: PROTONIX  Take 1 tablet (40 mg total) by mouth every morning.   PHENobarbital  97.2 MG tablet Commonly known as: LUMINAL Take 1 tablet (97.2 mg total) by mouth at bedtime.   phenytoin  30 MG ER capsule Commonly known as: Dilantin  Take 1 capsule (30 mg total) by mouth daily. Take with 1 capsule 200 mg dose for total daily dose 230 mg   phenytoin  200 MG ER capsule Commonly known as: DILANTIN  Take 1 capsule (200 mg total) by mouth daily. Take with 1 capsule 30 mg dose for total daily dose 230 mg   rosuvastatin  40 MG tablet Commonly known as: CRESTOR  Take 1 tablet (40 mg total) by mouth at bedtime.        No Known Allergies  Consultations:    Procedures/Studies: DG Chest Port 1 View Result Date: 03/12/2024 EXAM: 1 VIEW(S) XRAY OF THE CHEST 03/12/2024 08:05:00 AM COMPARISON: PA and lateral radiographs of the chest dated 03/11/2024. CLINICAL HISTORY: Pneumothorax. FINDINGS: LUNGS AND PLEURA: Mild persistent elevation of the left hemidiaphragm with mild plate-like atelectasis again demonstrated in the left lung base. No focal pulmonary opacity. No pulmonary edema. No pleural effusion. No pneumothorax. HEART AND MEDIASTINUM: No acute abnormality of the cardiac and mediastinal silhouettes. BONES AND SOFT TISSUES: No acute osseous abnormality. IMPRESSION: 1. No acute findings. 2. Mild persistent elevation of the left hemidiaphragm with mild plate-like atelectasis in the left lung base. Electronically signed by: Evalene Coho MD 03/12/2024 08:11 AM EST RP Workstation: [id number]   CT Chest Wo Contrast Result Date: 03/11/2024 EXAM: CT CHEST WITHOUT CONTRAST 03/11/2024 03:44:25 PM TECHNIQUE: CT of the chest was performed without the administration of intravenous contrast. Multiplanar reformatted images are provided for review. Automated exposure control, iterative reconstruction, and/or weight based adjustment of the mA/kV was utilized to reduce the radiation  dose to as low as reasonably achievable. COMPARISON: 05/09/2021 CLINICAL HISTORY: Chest trauma, blunt; small ptx on ct cervical spine, not appreciated on cxr. FINDINGS: MEDIASTINUM: Heart and pericardium are unremarkable. Coronary artery calcifications are noted. The central airways are clear. LYMPH NODES: No mediastinal, hilar or axillary lymphadenopathy. LUNGS AND PLEURA: Elevated left hemidiaphragm is noted. Minimal right pneumothorax is noted anteriorly. Bilateral posterior basilar opacities are noted with air bronchograms concerning for pneumonia or possibly atelectasis. Left lingular atelectasis or infiltrate is noted as well. No pleural effusion. SOFT TISSUES/BONES: Minimally displaced fracture involving posterior portion of right sixth rib. UPPER ABDOMEN: Limited images of the upper abdomen demonstrates no acute abnormality. VASCULATURE: Aortic atherosclerosis. IMPRESSION: 1. Minimal right anterior pneumothorax. 2. Minimally displaced posterior right sixth rib fracture. 3. Bilateral posterior basilar airspace  opacities with air bronchograms, suspicious for pneumonia versus atelectasis. 4. Left lingular atelectasis or infiltrate. 5. Aortic atherosclerosis. 6. Coronary artery calcifications. Electronically signed by: Lynwood Seip MD 03/11/2024 04:13 PM EST RP Workstation: [id number]   DG Chest 2 View Result Date: 03/11/2024 CLINICAL DATA:  Pneumonia, fall. EXAM: CHEST - 2 VIEW COMPARISON:  09/18/2021, 03/11/2024. FINDINGS: The heart is enlarged and the mediastinal contour is within normal limits. There is atherosclerotic calcification of the aorta. There is chronic elevation of the left diaphragm with strandy atelectasis or infiltrate at the lung bases. No effusion or pneumothorax is seen. Degenerative changes are present in the thoracic spine. No acute osseous abnormality IMPRESSION: Strandy atelectasis or infiltrate at the lung bases. Electronically Signed   By: [name] M.D.   On: 03/11/2024 14:16    CT Cervical Spine Wo Contrast Result Date: 03/11/2024 CLINICAL DATA:  Neck trauma, fell EXAM: CT CERVICAL SPINE WITHOUT CONTRAST TECHNIQUE: Multidetector CT imaging of the cervical spine was performed without intravenous contrast. Multiplanar CT image reconstructions were also generated. RADIATION DOSE REDUCTION: This exam was performed according to the departmental dose-optimization program which includes automated exposure control, adjustment of the mA and/or kV according to patient size and/or use of iterative reconstruction technique. COMPARISON:  02/08/2023 FINDINGS: Alignment: Alignment is grossly anatomic. Skull base and vertebrae: No acute fracture. No primary bone lesion or focal pathologic process. Soft tissues and spinal canal: No prevertebral fluid or swelling. No visible canal hematoma. Disc levels: Disc spaces are relatively well preserved. Mild diffuse facet hypertrophy. No bony encroachment upon the central canal or neural foramina. Upper chest: Airway is patent. There is a trace right apical pneumothorax identified. No evidence of tension effect. Other: Reconstructed images demonstrate no additional findings. IMPRESSION: 1. Incidental trace right apical pneumothorax.  No tension effect. 2. No acute cervical spine fracture. Critical Value/emergent results were called by telephone at the time of interpretation on 03/11/2024 at 1:23pm to provider NEHA RAY , who verbally acknowledged these results. Electronically Signed   By: Ozell Daring M.D.   On: 03/11/2024 13:26   CT ANGIO GI BLEED Result Date: 03/11/2024 CLINICAL DATA:  Right-sided abdominal pain.  Rectal bleeding.  Fall. EXAM: CTA ABDOMEN AND PELVIS WITHOUT AND WITH CONTRAST TECHNIQUE: Multidetector CT imaging of the abdomen and pelvis was performed using the standard protocol during bolus administration of intravenous contrast. Multiplanar reconstructed images and MIPs were obtained and reviewed to evaluate the vascular anatomy.  RADIATION DOSE REDUCTION: This exam was performed according to the departmental dose-optimization program which includes automated exposure control, adjustment of the mA and/or kV according to patient size and/or use of iterative reconstruction technique. CONTRAST:  OMNIPAQUE  IOHEXOL  350 MG/ML SOLN COMPARISON:  CT abdomen/pelvis without contrast 09/18/2021 and CT abdomen pelvis with contrast 05/07/2021 FINDINGS: VASCULAR Aorta: Abdominal aorta is normal caliber. No evidence of aneurysm or dissection. Mild calcified plaque throughout the abdominal aorta. Celiac: Mild plaque at its origin as the celiac artery is otherwise normal and patent. SMA: Mild plaque at its origin as the superior mesenteric artery and branches are are patent. Renals: Calcified plaque at the origin of the right renal artery which is otherwise patent. Left renal arteries patent. There is a small inferior accessory left renal artery. IMA: Patent. Inflow: Patent without significant stenosis or occlusion. Proximal Outflow: Patent without significant stenosis or occlusion. Veins: Unremarkable. Review of the MIP images confirms the above findings. NON-VASCULAR Lower chest: Mild stable elevation of the left hemidiaphragm. Calcified plaque over the descending thoracic  aorta. Borderline cardiomegaly. Moderate calcified plaque over the left anterior descending and right coronary arteries. Evidence of prior CABG. Mild patchy linear density over the lung bases likely scarring/atelectasis. Minimal left basilar consolidation likely atelectasis although infection is possible. Mild heterogeneous density over the posterior right base which again is likely due to atelectasis although infection is possible. No significant pleural effusion. Hepatobiliary: Liver, gallbladder and biliary tree are normal. Pancreas: Normal. Spleen: Normal. Adrenals/Urinary Tract: Adrenal glands are normal. Kidneys are normal size without hydronephrosis or nephrolithiasis.  Exophytic 4.4 cm left renal cyst without significant change. Ureters are normal. Bladder minimally distended with air over the nondependent portion of bladder likely due to recent instrumentation. Stomach/Bowel: Stomach and small bowel are normal. Minimal diverticulosis of the colon. Appendix is normal. Cecum is somewhat high and in the midline to just right of midline. Note that there are a few foci of air along the anterior abdominal wall near the junction to the diaphragm as several of these appear to communicate with the lower lung as this is likely not free peritoneal air. No other findings to suggest free peritoneal air. Lymphatic: No adenopathy. Reproductive: Prostate is unremarkable. Other: No free fluid or focal inflammatory change. Musculoskeletal: Stable L4 compression fracture. No other focal abnormality. IMPRESSION: 1. No acute findings in the abdomen/pelvis. No evidence of acute lower GI bleed source. 2. No evidence of aortic aneurysm or dissection. Mild atherosclerotic disease of the abdominal aorta and branches as described. 3. Minimal diverticulosis of the colon. 4. Stable 4.4 cm left renal cyst. 5. Stable L4 compression fracture. 6. Aortic atherosclerosis. Aortic Atherosclerosis (ICD10-I70.0). Electronically Signed   By: Toribio Agreste M.D.   On: 03/11/2024 13:11   CT Head Wo Contrast Result Date: 03/11/2024 CLINICAL DATA:  Clemens, facial trauma EXAM: CT HEAD WITHOUT CONTRAST TECHNIQUE: Contiguous axial images were obtained from the base of the skull through the vertex without intravenous contrast. RADIATION DOSE REDUCTION: This exam was performed according to the departmental dose-optimization program which includes automated exposure control, adjustment of the mA and/or kV according to patient size and/or use of iterative reconstruction technique. COMPARISON:  01/01/2024 FINDINGS: Brain: Focal hypodensity in the left basal ganglia/corona radiata consistent with evolution of the infarct seen on  previous study. Chronic small vessel ischemic changes are seen elsewhere within the basal ganglia and periventricular white matter. No evidence of acute infarct or hemorrhage. Lateral ventricles and remaining midline structures are unremarkable. No acute extra-axial fluid collections. No mass effect. Vascular: Stable atherosclerosis.  No hyperdense vessel. Skull: Small left supraorbital scalp hematoma. No underlying fracture. The remainder of the calvarium is unremarkable. Sinuses/Orbits: No acute finding. Other: None. IMPRESSION: 1. Chronic ischemic changes as above. No acute intracranial process. Electronically Signed   By: Ozell Daring M.D.   On: 03/11/2024 13:09      Discharge Exam: Vitals:   03/13/24 0421 03/13/24 0700  BP: 123/66 139/68  Pulse: 81 79  Resp: 16 17  Temp: 98 F (36.7 C) 97.6 F (36.4 C)  SpO2: 93% 94%   Vitals:   03/12/24 1559 03/12/24 1954 03/13/24 0421 03/13/24 0700  BP: (!) 108/55 116/60 123/66 139/68  Pulse: 91 88 81 79  Resp: 17 16 16 17   Temp: 98.8 F (37.1 C) 98.7 F (37.1 C) 98 F (36.7 C) 97.6 F (36.4 C)  TempSrc: Oral Oral  Oral  SpO2: 94% 95% 93% 94%  Weight:      Height:       Cardiovascular: Rate and Rhythm: Normal rate  and regular rhythm.  Pulmonary: Non labored, symmetric rise of chest wall.  Skin: warm and dry. not jaundiced.  Neurological: Speech is slow but coherent.  Alert. Oriented.  Psychiatric: Mood and Affect congruent.    The results of significant diagnostics from this hospitalization (including imaging, microbiology, ancillary and laboratory) are listed below for reference.     Microbiology: Recent Results (from the past 240 hours)  MRSA Next Gen by PCR, Nasal     Status: None   Collection Time: 03/11/24  5:28 PM   Specimen: Nasal Mucosa; Nasal Swab  Result Value Ref Range Status   MRSA by PCR Next Gen NOT DETECTED NOT DETECTED Final    Comment: (NOTE) The GeneXpert MRSA Assay (FDA approved for NASAL specimens  only), is one component of a comprehensive MRSA colonization surveillance program. It is not intended to diagnose MRSA infection nor to guide or monitor treatment for MRSA infections. Test performance is not FDA approved in patients less than 70 years old. Performed at [facility], [address] [place], [place] [address]   Resp panel by RT-PCR (RSV, Flu A&B, Covid) Anterior Nasal Swab     Status: None   Collection Time: 03/11/24  5:28 PM   Specimen: Anterior Nasal Swab  Result Value Ref Range Status   SARS Coronavirus 2 by RT PCR NEGATIVE NEGATIVE Final    Comment: (NOTE) SARS-CoV-2 target nucleic acids are NOT DETECTED.  The SARS-CoV-2 RNA is generally detectable in upper respiratory specimens during the acute phase of infection. The lowest concentration of SARS-CoV-2 viral copies this assay can detect is 138 copies/mL. A negative result does not preclude SARS-Cov-2 infection and should not be used as the sole basis for treatment or other patient management decisions. A negative result may occur with  improper specimen collection/handling, submission of specimen other than nasopharyngeal swab, presence of viral mutation(s) within the areas targeted by this assay, and inadequate number of viral copies(<138 copies/mL). A negative result must be combined with clinical observations, patient history, and epidemiological information. The expected result is Negative.  Fact Sheet for Patients:  bloggercourse.com  Fact Sheet for Healthcare Providers:  seriousbroker.it  This test is no t yet approved or cleared by the [place]  FDA and  has been authorized for detection and/or diagnosis of SARS-CoV-2 by FDA under an Emergency Use Authorization (EUA). This EUA will remain  in effect (meaning this test can be used) for the duration of the COVID-19 declaration under Section 564(b)(1) of the Act, 21 U.S.C.section  360bbb-3(b)(1), unless the authorization is terminated  or revoked sooner.       Influenza A by PCR NEGATIVE NEGATIVE Final   Influenza B by PCR NEGATIVE NEGATIVE Final    Comment: (NOTE) The Xpert Xpress SARS-CoV-2/FLU/RSV plus assay is intended as an aid in the diagnosis of influenza from Nasopharyngeal swab specimens and should not be used as a sole basis for treatment. Nasal washings and aspirates are unacceptable for Xpert Xpress SARS-CoV-2/FLU/RSV testing.  Fact Sheet for Patients: bloggercourse.com  Fact Sheet for Healthcare Providers: seriousbroker.it  This test is not yet approved or cleared by the [place]  FDA and has been authorized for detection and/or diagnosis of SARS-CoV-2 by FDA under an Emergency Use Authorization (EUA). This EUA will remain in effect (meaning this test can be used) for the duration of the COVID-19 declaration under Section 564(b)(1) of the Act, 21 U.S.C. section 360bbb-3(b)(1), unless the authorization is terminated or revoked.     Resp Syncytial Virus by  PCR NEGATIVE NEGATIVE Final    Comment: (NOTE) Fact Sheet for Patients: bloggercourse.com  Fact Sheet for Healthcare Providers: seriousbroker.it  This test is not yet approved or cleared by the [place]  FDA and has been authorized for detection and/or diagnosis of SARS-CoV-2 by FDA under an Emergency Use Authorization (EUA). This EUA will remain in effect (meaning this test can be used) for the duration of the COVID-19 declaration under Section 564(b)(1) of the Act, 21 U.S.C. section 360bbb-3(b)(1), unless the authorization is terminated or revoked.  Performed at [facility], [address] [place], [place] [address]      Labs: BNP (last 3 results) No results for input(s): BNP in the last 8760 hours. Basic Metabolic Panel: Recent Labs  Lab 03/11/24 1050  03/12/24 0526 03/13/24 0534  NA 140 141 141  K 3.8 3.3* 3.3*  CL 106 107 107  CO2 21* 23 23  GLUCOSE 110* 88 95  BUN 15 13 18   CREATININE 0.76 0.59* 0.57*  CALCIUM  9.0 8.7* 8.7*  MG  --  1.9 1.9  PHOS  --  3.4 3.0   Liver Function Tests: Recent Labs  Lab 03/11/24 1050 03/12/24 0526 03/13/24 0534  AST 19 11* 11*  ALT 11 8 8   ALKPHOS 79 68 60  BILITOT 0.6 1.0 0.8  PROT 7.6 7.1 6.6  ALBUMIN 3.9 3.5 3.1*   No results for input(s): LIPASE, AMYLASE in the last 168 hours. No results for input(s): AMMONIA in the last 168 hours. CBC: Recent Labs  Lab 03/11/24 1146 03/12/24 0526 03/13/24 0534  WBC 10.4  10.5 6.6 7.1  NEUTROABS 7.9* 4.0 4.4  HGB 11.9*  12.0* 10.6* 10.9*  HCT 36.8*  36.7* 33.2* 34.1*  MCV 92.0  91.1 90.7 91.7  PLT 212  203 189 180   Cardiac Enzymes: No results for input(s): CKTOTAL, CKMB, CKMBINDEX, TROPONINI in the last 168 hours. BNP: Invalid input(s): POCBNP CBG: No results for input(s): GLUCAP in the last 168 hours. D-Dimer No results for input(s): DDIMER in the last 72 hours. Hgb A1c No results for input(s): HGBA1C in the last 72 hours. Lipid Profile No results for input(s): CHOL, HDL, LDLCALC, TRIG, CHOLHDL, LDLDIRECT in the last 72 hours. Thyroid  function studies No results for input(s): TSH, T4TOTAL, T3FREE, THYROIDAB in the last 72 hours.  Invalid input(s): FREET3 Anemia work up No results for input(s): VITAMINB12, FOLATE, FERRITIN, TIBC, IRON, RETICCTPCT in the last 72 hours. Urinalysis    Component Value Date/Time   COLORURINE YELLOW 03/11/2024 1521   APPEARANCEUR CLOUDY (A) 03/11/2024 1521   APPEARANCEUR Clear 08/21/2021 1046   LABSPEC >1.046 (H) 03/11/2024 1521   PHURINE 7.0 03/11/2024 1521   GLUCOSEU NEGATIVE 03/11/2024 1521   HGBUR NEGATIVE 03/11/2024 1521   BILIRUBINUR NEGATIVE 03/11/2024 1521   BILIRUBINUR Negative 01/31/2024 1445   BILIRUBINUR Negative 08/21/2021  1046   KETONESUR 20 (A) 03/11/2024 1521   PROTEINUR NEGATIVE 03/11/2024 1521   UROBILINOGEN 0.2 01/31/2024 1445   NITRITE NEGATIVE 03/11/2024 1521   LEUKOCYTESUR MODERATE (A) 03/11/2024 1521   Sepsis Labs Recent Labs  Lab 03/11/24 1146 03/12/24 0526 03/13/24 0534  WBC 10.4  10.5 6.6 7.1   Microbiology Recent Results (from the past 240 hours)  MRSA Next Gen by PCR, Nasal     Status: None   Collection Time: 03/11/24  5:28 PM   Specimen: Nasal Mucosa; Nasal Swab  Result Value Ref Range Status   MRSA by PCR Next Gen NOT DETECTED NOT DETECTED Final  Comment: (NOTE) The GeneXpert MRSA Assay (FDA approved for NASAL specimens only), is one component of a comprehensive MRSA colonization surveillance program. It is not intended to diagnose MRSA infection nor to guide or monitor treatment for MRSA infections. Test performance is not FDA approved in patients less than 14 years old. Performed at [facility], [address] [place], [place] [address]   Resp panel by RT-PCR (RSV, Flu A&B, Covid) Anterior Nasal Swab     Status: None   Collection Time: 03/11/24  5:28 PM   Specimen: Anterior Nasal Swab  Result Value Ref Range Status   SARS Coronavirus 2 by RT PCR NEGATIVE NEGATIVE Final    Comment: (NOTE) SARS-CoV-2 target nucleic acids are NOT DETECTED.  The SARS-CoV-2 RNA is generally detectable in upper respiratory specimens during the acute phase of infection. The lowest concentration of SARS-CoV-2 viral copies this assay can detect is 138 copies/mL. A negative result does not preclude SARS-Cov-2 infection and should not be used as the sole basis for treatment or other patient management decisions. A negative result may occur with  improper specimen collection/handling, submission of specimen other than nasopharyngeal swab, presence of viral mutation(s) within the areas targeted by this assay, and inadequate number of viral copies(<138 copies/mL). A negative result  must be combined with clinical observations, patient history, and epidemiological information. The expected result is Negative.  Fact Sheet for Patients:  bloggercourse.com  Fact Sheet for Healthcare Providers:  seriousbroker.it  This test is no t yet approved or cleared by the [place]  FDA and  has been authorized for detection and/or diagnosis of SARS-CoV-2 by FDA under an Emergency Use Authorization (EUA). This EUA will remain  in effect (meaning this test can be used) for the duration of the COVID-19 declaration under Section 564(b)(1) of the Act, 21 U.S.C.section 360bbb-3(b)(1), unless the authorization is terminated  or revoked sooner.       Influenza A by PCR NEGATIVE NEGATIVE Final   Influenza B by PCR NEGATIVE NEGATIVE Final    Comment: (NOTE) The Xpert Xpress SARS-CoV-2/FLU/RSV plus assay is intended as an aid in the diagnosis of influenza from Nasopharyngeal swab specimens and should not be used as a sole basis for treatment. Nasal washings and aspirates are unacceptable for Xpert Xpress SARS-CoV-2/FLU/RSV testing.  Fact Sheet for Patients: bloggercourse.com  Fact Sheet for Healthcare Providers: seriousbroker.it  This test is not yet approved or cleared by the [place]  FDA and has been authorized for detection and/or diagnosis of SARS-CoV-2 by FDA under an Emergency Use Authorization (EUA). This EUA will remain in effect (meaning this test can be used) for the duration of the COVID-19 declaration under Section 564(b)(1) of the Act, 21 U.S.C. section 360bbb-3(b)(1), unless the authorization is terminated or revoked.     Resp Syncytial Virus by PCR NEGATIVE NEGATIVE Final    Comment: (NOTE) Fact Sheet for Patients: bloggercourse.com  Fact Sheet for Healthcare Providers: seriousbroker.it  This test is  not yet approved or cleared by the [place]  FDA and has been authorized for detection and/or diagnosis of SARS-CoV-2 by FDA under an Emergency Use Authorization (EUA). This EUA will remain in effect (meaning this test can be used) for the duration of the COVID-19 declaration under Section 564(b)(1) of the Act, 21 U.S.C. section 360bbb-3(b)(1), unless the authorization is terminated or revoked.  Performed at [facility], [address] [place], [place] [address]      Time coordinating discharge: 32 min   SIGNED: Katalia Choma, DO  [facility] 03/13/2024, 12:36 PM Pager   If 7PM-7AM, please contact night-coverage

## 2024-03-13 NOTE — Progress Notes (Signed)
 Occupational Therapy Treatment Patient Details Name: Edward Thompson MRN: [id number] DOB: 1950-09-25 Today's Date: 03/13/2024   History of present illness Edward Thompson is a 73 y.o. male with medical history significant of CAD, epilepsy on phenobarbital  phenytoin , OSA, hypertension, memory loss, frequent falls and weakness, severe sensorimotor polyneuropathy, history of CVA, diabetes, who presents to the ED with worsening weakness s/p fall. CT cervical spine is without acute cervical changes but does note tiny apical pneumothorax   OT comments  Edward Thompson was seen for OT treatment on this date. Upon arrival to room pt in bed, agreeable to tx. Pt requires no physical assist to exit nearly flat bed. CGA for bed>chair squat pivot t/f with greatly improved safety from prior session. MAX A don B shoes (reports children assist with donning at home). Pt making good progress toward goals, will continue to follow POC. Discharge recommendation remains appropriate.       If plan is discharge home, recommend the following:  A little help with bathing/dressing/bathroom;Help with stairs or ramp for entrance   Equipment Recommendations  BSC/3in1    Recommendations for Other Services      Precautions / Restrictions Precautions Precautions: Fall Recall of Precautions/Restrictions: Intact Restrictions Weight Bearing Restrictions Per Provider Order: No       Mobility Bed Mobility Overal bed mobility: Needs Assistance Bed Mobility: Supine to Sit     Supine to sit: Supervision     General bed mobility comments: from nearly flat bed    Transfers Overall transfer level: Needs assistance Equipment used: None Transfers: Bed to chair/wheelchair/BSC   Stand pivot transfers: Supervision               Balance Overall balance assessment: Needs assistance Sitting-balance support: No upper extremity supported, Feet supported Sitting balance-Leahy Scale: Good     Standing balance support:  Bilateral upper extremity supported Standing balance-Leahy Scale: Fair                             ADL either performed or assessed with clinical judgement   ADL Overall ADL's : Needs assistance/impaired                                       General ADL Comments: CGA for simulated w/c t/f, MAX A don B shoes (reports children assist with donning at home)     Communication Communication Communication: Impaired Factors Affecting Communication: Reduced clarity of speech   Cognition Arousal: Alert Behavior During Therapy: Flat affect, WFL for tasks assessed/performed Cognition: No apparent impairments                               Following commands: Intact                      Pertinent Vitals/ Pain       Pain Assessment Pain Assessment: No/denies pain   Frequency  Min 2X/week        Progress Toward Goals  OT Goals(current goals can now be found in the care plan section)  Progress towards OT goals: Progressing toward goals  Acute Rehab OT Goals OT Goal Formulation: With patient Time For Goal Achievement: 03/26/24 Potential to Achieve Goals: Good ADL Goals Pt Will Perform Grooming: with modified independence;sitting Pt Will Perform Lower Body  Dressing: with modified independence;sit to/from stand Pt Will Transfer to Toilet: with modified independence;squat pivot transfer;bedside commode  Plan      Co-evaluation                 AM-PAC OT 6 Clicks Daily Activity     Outcome Measure   Help from another person eating meals?: None Help from another person taking care of personal grooming?: A Little Help from another person toileting, which includes using toliet, bedpan, or urinal?: A Little Help from another person bathing (including washing, rinsing, drying)?: A Little Help from another person to put on and taking off regular upper body clothing?: None Help from another person to put on and taking off  regular lower body clothing?: A Little 6 Click Score: 20    End of Session    OT Visit Diagnosis: Other abnormalities of gait and mobility (R26.89);Muscle weakness (generalized) (M62.81)   Activity Tolerance Patient tolerated treatment well   Patient Left in chair;with call bell/phone within reach;with chair alarm set   Nurse Communication          Time: 8474-8465 OT Time Calculation (min): 9 min  Charges: OT General Charges $OT Visit: 1 Visit OT Treatments $Self Care/Home Management : 8-22 mins  Elston Slot, M.S. OTR/L  03/13/24, 4:05 PM  ascom [phone]

## 2024-03-13 NOTE — Plan of Care (Signed)

## 2024-03-14 ENCOUNTER — Telehealth: Payer: Self-pay | Admitting: Internal Medicine

## 2024-03-14 NOTE — Telephone Encounter (Signed)
 Patient's daughter left VM requesting a call back. Did not state what she needs.

## 2024-03-16 DIAGNOSIS — I119 Hypertensive heart disease without heart failure: Secondary | ICD-10-CM | POA: Diagnosis not present

## 2024-03-16 DIAGNOSIS — G40909 Epilepsy, unspecified, not intractable, without status epilepticus: Secondary | ICD-10-CM | POA: Diagnosis not present

## 2024-03-16 DIAGNOSIS — S32048D Other fracture of fourth lumbar vertebra, subsequent encounter for fracture with routine healing: Secondary | ICD-10-CM | POA: Diagnosis not present

## 2024-03-16 DIAGNOSIS — I6529 Occlusion and stenosis of unspecified carotid artery: Secondary | ICD-10-CM | POA: Diagnosis not present

## 2024-03-16 DIAGNOSIS — S2231XD Fracture of one rib, right side, subsequent encounter for fracture with routine healing: Secondary | ICD-10-CM | POA: Diagnosis not present

## 2024-03-16 DIAGNOSIS — S270XXD Traumatic pneumothorax, subsequent encounter: Secondary | ICD-10-CM | POA: Diagnosis not present

## 2024-03-16 DIAGNOSIS — I700 Atherosclerosis of aorta: Secondary | ICD-10-CM | POA: Diagnosis not present

## 2024-03-16 DIAGNOSIS — I2510 Atherosclerotic heart disease of native coronary artery without angina pectoris: Secondary | ICD-10-CM | POA: Diagnosis not present

## 2024-03-16 DIAGNOSIS — E119 Type 2 diabetes mellitus without complications: Secondary | ICD-10-CM | POA: Diagnosis not present

## 2024-03-16 LAB — URINE CULTURE: Culture: >=100000 — AB

## 2024-03-19 DIAGNOSIS — I6529 Occlusion and stenosis of unspecified carotid artery: Secondary | ICD-10-CM | POA: Diagnosis not present

## 2024-03-19 DIAGNOSIS — I119 Hypertensive heart disease without heart failure: Secondary | ICD-10-CM | POA: Diagnosis not present

## 2024-03-19 DIAGNOSIS — I700 Atherosclerosis of aorta: Secondary | ICD-10-CM | POA: Diagnosis not present

## 2024-03-19 DIAGNOSIS — S2231XD Fracture of one rib, right side, subsequent encounter for fracture with routine healing: Secondary | ICD-10-CM | POA: Diagnosis not present

## 2024-03-19 DIAGNOSIS — G40909 Epilepsy, unspecified, not intractable, without status epilepticus: Secondary | ICD-10-CM | POA: Diagnosis not present

## 2024-03-19 DIAGNOSIS — E119 Type 2 diabetes mellitus without complications: Secondary | ICD-10-CM | POA: Diagnosis not present

## 2024-03-19 DIAGNOSIS — I2510 Atherosclerotic heart disease of native coronary artery without angina pectoris: Secondary | ICD-10-CM | POA: Diagnosis not present

## 2024-03-20 ENCOUNTER — Telehealth: Payer: Self-pay

## 2024-03-20 NOTE — Telephone Encounter (Signed)
 Stephanie with [facility] called asking for verbal okay for 1w x 4 and 1w every other week 2 visits

## 2024-03-22 NOTE — Telephone Encounter (Signed)
 LM giving verbal okay to stephanie

## 2024-04-03 DIAGNOSIS — G40909 Epilepsy, unspecified, not intractable, without status epilepticus: Secondary | ICD-10-CM | POA: Diagnosis not present

## 2024-04-04 ENCOUNTER — Telehealth: Payer: Self-pay

## 2024-04-04 NOTE — Telephone Encounter (Signed)
 Pt reports a fall transferring from his bed to his wheel chair on 11/24. No injuries were endured.

## 2024-04-06 DIAGNOSIS — S270XXD Traumatic pneumothorax, subsequent encounter: Secondary | ICD-10-CM | POA: Diagnosis not present

## 2024-04-06 DIAGNOSIS — E119 Type 2 diabetes mellitus without complications: Secondary | ICD-10-CM | POA: Diagnosis not present

## 2024-04-06 DIAGNOSIS — I700 Atherosclerosis of aorta: Secondary | ICD-10-CM | POA: Diagnosis not present

## 2024-04-06 DIAGNOSIS — I2510 Atherosclerotic heart disease of native coronary artery without angina pectoris: Secondary | ICD-10-CM | POA: Diagnosis not present

## 2024-04-06 DIAGNOSIS — I6529 Occlusion and stenosis of unspecified carotid artery: Secondary | ICD-10-CM | POA: Diagnosis not present

## 2024-04-06 DIAGNOSIS — G40909 Epilepsy, unspecified, not intractable, without status epilepticus: Secondary | ICD-10-CM | POA: Diagnosis not present

## 2024-04-06 DIAGNOSIS — S32048D Other fracture of fourth lumbar vertebra, subsequent encounter for fracture with routine healing: Secondary | ICD-10-CM | POA: Diagnosis not present

## 2024-04-06 DIAGNOSIS — I119 Hypertensive heart disease without heart failure: Secondary | ICD-10-CM | POA: Diagnosis not present

## 2024-04-09 DIAGNOSIS — I700 Atherosclerosis of aorta: Secondary | ICD-10-CM | POA: Diagnosis not present

## 2024-04-09 DIAGNOSIS — I2510 Atherosclerotic heart disease of native coronary artery without angina pectoris: Secondary | ICD-10-CM | POA: Diagnosis not present

## 2024-04-09 DIAGNOSIS — I6529 Occlusion and stenosis of unspecified carotid artery: Secondary | ICD-10-CM | POA: Diagnosis not present

## 2024-04-09 DIAGNOSIS — I119 Hypertensive heart disease without heart failure: Secondary | ICD-10-CM | POA: Diagnosis not present

## 2024-04-09 DIAGNOSIS — S32048D Other fracture of fourth lumbar vertebra, subsequent encounter for fracture with routine healing: Secondary | ICD-10-CM | POA: Diagnosis not present

## 2024-04-09 DIAGNOSIS — S270XXD Traumatic pneumothorax, subsequent encounter: Secondary | ICD-10-CM | POA: Diagnosis not present

## 2024-04-09 DIAGNOSIS — G40909 Epilepsy, unspecified, not intractable, without status epilepticus: Secondary | ICD-10-CM | POA: Diagnosis not present

## 2024-04-09 DIAGNOSIS — E119 Type 2 diabetes mellitus without complications: Secondary | ICD-10-CM | POA: Diagnosis not present

## 2024-04-10 ENCOUNTER — Ambulatory Visit: Admitting: Internal Medicine

## 2024-04-10 DIAGNOSIS — S2231XD Fracture of one rib, right side, subsequent encounter for fracture with routine healing: Secondary | ICD-10-CM | POA: Diagnosis not present

## 2024-04-10 DIAGNOSIS — G40909 Epilepsy, unspecified, not intractable, without status epilepticus: Secondary | ICD-10-CM | POA: Diagnosis not present

## 2024-04-10 DIAGNOSIS — I700 Atherosclerosis of aorta: Secondary | ICD-10-CM | POA: Diagnosis not present

## 2024-04-10 DIAGNOSIS — S32048D Other fracture of fourth lumbar vertebra, subsequent encounter for fracture with routine healing: Secondary | ICD-10-CM | POA: Diagnosis not present

## 2024-04-10 DIAGNOSIS — E119 Type 2 diabetes mellitus without complications: Secondary | ICD-10-CM | POA: Diagnosis not present

## 2024-04-10 DIAGNOSIS — I6529 Occlusion and stenosis of unspecified carotid artery: Secondary | ICD-10-CM | POA: Diagnosis not present

## 2024-04-10 DIAGNOSIS — I119 Hypertensive heart disease without heart failure: Secondary | ICD-10-CM | POA: Diagnosis not present

## 2024-04-10 DIAGNOSIS — I2510 Atherosclerotic heart disease of native coronary artery without angina pectoris: Secondary | ICD-10-CM | POA: Diagnosis not present

## 2024-04-10 DIAGNOSIS — S270XXD Traumatic pneumothorax, subsequent encounter: Secondary | ICD-10-CM | POA: Diagnosis not present

## 2024-04-11 ENCOUNTER — Other Ambulatory Visit

## 2024-04-11 DIAGNOSIS — E782 Mixed hyperlipidemia: Secondary | ICD-10-CM | POA: Diagnosis not present

## 2024-04-11 DIAGNOSIS — E1165 Type 2 diabetes mellitus with hyperglycemia: Secondary | ICD-10-CM

## 2024-04-11 DIAGNOSIS — I10 Essential (primary) hypertension: Secondary | ICD-10-CM

## 2024-04-12 LAB — HEMOGLOBIN A1C
Est. average glucose Bld gHb Est-mCnc: 111 mg/dL
Hgb A1c MFr Bld: 5.5 % (ref 4.8–5.6)

## 2024-04-12 LAB — CMP14+EGFR
ALT: 13 IU/L (ref 0–44)
AST: 16 IU/L (ref 0–40)
Albumin: 4 g/dL (ref 3.8–4.8)
Alkaline Phosphatase: 98 IU/L (ref 47–123)
BUN/Creatinine Ratio: 23 (ref 10–24)
BUN: 14 mg/dL (ref 8–27)
Bilirubin Total: 0.2 mg/dL (ref 0.0–1.2)
CO2: 24 mmol/L (ref 20–29)
Calcium: 9 mg/dL (ref 8.6–10.2)
Chloride: 106 mmol/L (ref 96–106)
Creatinine, Ser: 0.62 mg/dL — ABNORMAL LOW (ref 0.76–1.27)
Globulin, Total: 2.7 g/dL (ref 1.5–4.5)
Glucose: 90 mg/dL (ref 70–99)
Potassium: 4 mmol/L (ref 3.5–5.2)
Sodium: 143 mmol/L (ref 134–144)
Total Protein: 6.7 g/dL (ref 6.0–8.5)
eGFR: 101 mL/min/1.73 (ref >59)

## 2024-04-12 LAB — LIPID PANEL
Chol/HDL Ratio: 2.6 ratio (ref 0.0–5.0)
Cholesterol, Total: 138 mg/dL (ref 100–199)
HDL: 54 mg/dL (ref >39)
LDL Chol Calc (NIH): 72 mg/dL (ref 0–99)
Triglycerides: 58 mg/dL (ref 0–149)
VLDL Cholesterol Cal: 12 mg/dL (ref 5–40)

## 2024-04-12 LAB — CBC WITH DIFFERENTIAL/PLATELET
Basophils Absolute: 0 x10E3/uL (ref 0.0–0.2)
Basos: 0 %
EOS (ABSOLUTE): 0.3 x10E3/uL (ref 0.0–0.4)
Eos: 4 %
Hematocrit: 35.9 % — ABNORMAL LOW (ref 37.5–51.0)
Hemoglobin: 11.4 g/dL — ABNORMAL LOW (ref 13.0–17.7)
Immature Grans (Abs): 0 x10E3/uL (ref 0.0–0.1)
Immature Granulocytes: 0 %
Lymphocytes Absolute: 2.1 x10E3/uL (ref 0.7–3.1)
Lymphs: 32 %
MCH: 29.8 pg (ref 26.6–33.0)
MCHC: 31.8 g/dL (ref 31.5–35.7)
MCV: 94 fL (ref 79–97)
Monocytes Absolute: 0.6 x10E3/uL (ref 0.1–0.9)
Monocytes: 9 %
Neutrophils Absolute: 3.4 x10E3/uL (ref 1.4–7.0)
Neutrophils: 55 %
Platelets: 176 x10E3/uL (ref 150–450)
RBC: 3.82 x10E6/uL — ABNORMAL LOW (ref 4.14–5.80)
RDW: 14 % (ref 11.6–15.4)
WBC: 6.4 x10E3/uL (ref 3.4–10.8)

## 2024-04-13 ENCOUNTER — Ambulatory Visit: Payer: Self-pay | Admitting: Internal Medicine

## 2024-04-13 ENCOUNTER — Ambulatory Visit: Admitting: Internal Medicine

## 2024-04-13 VITALS — BP 110/70 | HR 80 | Temp 97.8°F | Ht 64.0 in | Wt 195.4 lb

## 2024-04-13 DIAGNOSIS — E1165 Type 2 diabetes mellitus with hyperglycemia: Secondary | ICD-10-CM | POA: Diagnosis not present

## 2024-04-13 DIAGNOSIS — N401 Enlarged prostate with lower urinary tract symptoms: Secondary | ICD-10-CM | POA: Diagnosis not present

## 2024-04-13 DIAGNOSIS — G40909 Epilepsy, unspecified, not intractable, without status epilepticus: Secondary | ICD-10-CM

## 2024-04-13 DIAGNOSIS — Z8673 Personal history of transient ischemic attack (TIA), and cerebral infarction without residual deficits: Secondary | ICD-10-CM | POA: Diagnosis not present

## 2024-04-13 DIAGNOSIS — R350 Frequency of micturition: Secondary | ICD-10-CM | POA: Diagnosis not present

## 2024-04-13 DIAGNOSIS — E119 Type 2 diabetes mellitus without complications: Secondary | ICD-10-CM

## 2024-04-13 DIAGNOSIS — Z0130 Encounter for examination of blood pressure without abnormal findings: Secondary | ICD-10-CM

## 2024-04-13 LAB — POCT URINALYSIS DIPSTICK
Bilirubin, UA: NEGATIVE
Blood, UA: NEGATIVE
Glucose, UA: NEGATIVE
Ketones, UA: NEGATIVE
Nitrite, UA: POSITIVE
Protein, UA: NEGATIVE
Spec Grav, UA: 1.015 (ref 1.010–1.025)
Urobilinogen, UA: 0.2 U/dL
pH, UA: 6 (ref 5.0–8.0)

## 2024-04-13 LAB — POCT CBG (FASTING - GLUCOSE)-MANUAL ENTRY: Glucose Fasting, POC: 99 mg/dL (ref 70–99)

## 2024-04-13 LAB — POC CREATINE & ALBUMIN,URINE
Creatinine, POC: 50 mg/dL
Microalbumin Ur, POC: 30 mg/L

## 2024-04-13 MED ORDER — PHENYTOIN SODIUM EXTENDED 100 MG PO CAPS: 200.0000 mg | ORAL_CAPSULE | Freq: Every day | ORAL | 1 refills | Status: AC

## 2024-04-13 MED ORDER — PHENYTOIN SODIUM EXTENDED 30 MG PO CAPS: 30.0000 mg | ORAL_CAPSULE | Freq: Every day | ORAL | 1 refills | Status: AC

## 2024-04-13 NOTE — Progress Notes (Signed)
 Established Patient Office Visit  Subjective:  Patient ID: Edward Thompson, male    DOB: 30-Aug-1950  Age: 73 y.o. MRN: [id number]  Chief Complaint  Patient presents with   Follow-up    2 month lab results needs uACR    No new complaints, here for lab review and medication refills. Labs reviewed and notable for well controlled diabetes, A1c at target, lipids at target with unremarkable cmp. Denies any hypoglycemic episodes and home bg readings have been at target. Recent hospitalization for UTI with rectal bleeding.     No other concerns at this time.   Past Medical History:  Diagnosis Date   CAD (coronary artery disease)    Carotid stenosis, left 08/2017   Diabetes mellitus without complication (HCC)    Foot drop    Fracture of neck (HCC) 2008   fell off a roof. required halo x 4 months. also fractured alot of vertebrae   Heart attack (HCC) 2015   Heart disease    Hepatitis    6th grade    Hyperlipidemia    Hypertension    Myocardial infarction acute (HCC) 2015   Seizures (HCC)    taking phenobarbitol and dilantin . LAST SEIZURE WAS 2016. well controlled on meds   Sleep apnea    USES CPAP   Stroke (HCC) 2025   R hemiparesis and speech affected   Syncope 2019   Vertigo    Viral meningitis     Past Surgical History:  Procedure Laterality Date   CARDIAC SURGERY     COLONOSCOPY     COLONOSCOPY WITH PROPOFOL  N/A 03/11/2021   Procedure: COLONOSCOPY WITH PROPOFOL ;  Surgeon: Toledo, Ladell POUR, MD;  Location: [facility];  Service: Gastroenterology;  Laterality: N/A;   CORONARY ANGIOPLASTY     PATIENT UNAWARE OF THIS   CORONARY ARTERY BYPASS GRAFT  2015   ENDARTERECTOMY Left 08/19/2017   Procedure: ENDARTERECTOMY CAROTID;  Surgeon: Jama Cordella MATSU, MD;  Location: [facility];  Service: Vascular;  Laterality: Left;   ESOPHAGOGASTRODUODENOSCOPY N/A 03/11/2021   Procedure: ESOPHAGOGASTRODUODENOSCOPY (EGD);  Surgeon: Toledo, Ladell POUR, MD;  Location: [facility];   Service: Gastroenterology;  Laterality: N/A;  DM   HERNIA REPAIR Right 1985   inguinal    Social History   Socioeconomic History   Marital status: Widowed    Spouse name: Not on file   Number of children: Not on file   Years of education: Not on file   Highest education level: Not on file  Occupational History   Not on file  Tobacco Use   Smoking status: Never   Smokeless tobacco: Never  Vaping Use   Vaping status: Never Used  Substance and Sexual Activity   Alcohol use: No   Drug use: No   Sexual activity: Not Currently  Other Topics Concern   Not on file  Social History Narrative   Not on file   Social Drivers of Health   Financial Resource Strain: Low Risk (02/12/2023)   [facility]   Overall Financial Resource Strain (CARDIA)    Difficulty of Paying Living Expenses: Not very hard  Food Insecurity: No Food Insecurity (03/11/2024)   Hunger Vital Sign    Worried About Running Out of Food in the Last Year: Never true    Ran Out of Food in the Last Year: Never true  Transportation Needs: No Transportation Needs (03/11/2024)   PRAPARE - Transportation    Lack of Transportation (Medical): No    Lack  of Transportation (Non-Medical): No  Physical Activity: Not on file  Stress: Not on file  Social Connections: Socially Isolated (03/11/2024)   Social Connection and Isolation Panel    Frequency of Communication with Friends and Family: Twice a week    Frequency of Social Gatherings with Friends and Family: Twice a week    Attends Religious Services: Never    Database Administrator or Organizations: No    Attends Banker Meetings: Never    Marital Status: Widowed  Intimate Partner Violence: Not At Risk (03/11/2024)   Humiliation, Afraid, Rape, and Kick questionnaire    Fear of Current or Ex-Partner: No    Emotionally Abused: No    Physically Abused: No    Sexually Abused: No    Family History  Problem Relation Age of Onset   Heart  disease Mother    Heart attack Father     No Known Allergies  Outpatient Medications Prior to Visit  Medication Sig   Accu-Chek Softclix Lancets lancets TEST BLOOD SUGAR THREE TIMES DAILY   acetaminophen  (TYLENOL ) 500 MG tablet Take 500 mg by mouth every 6 (six) hours as needed.   aspirin  EC 81 MG tablet Take 81 mg by mouth at bedtime.   Blood Glucose Monitoring Suppl (ACCU-CHEK GUIDE) w/Device KIT    clopidogrel  (PLAVIX ) 75 MG tablet Take 1 tablet (75 mg total) by mouth daily.   furosemide  (LASIX ) 20 MG tablet Take 1 tablet (20 mg total) by mouth daily.   glucose blood (ACCU-CHEK GUIDE) test strip USE WITH DIABETIC METER TO TEST BLOOD SUGAR THREE TIMES DAILY   metFORMIN  (GLUCOPHAGE ) 500 MG tablet Take 1 tablet (500 mg total) by mouth every morning.   nitroGLYCERIN  (NITROSTAT ) 0.4 MG SL tablet Place 0.4 mg under the tongue every 5 (five) minutes as needed for chest pain.   pantoprazole  (PROTONIX ) 40 MG tablet Take 1 tablet (40 mg total) by mouth every morning.   PHENobarbital  (LUMINAL) 97.2 MG tablet Take 1 tablet (97.2 mg total) by mouth at bedtime.   rosuvastatin  (CRESTOR ) 40 MG tablet Take 1 tablet (40 mg total) by mouth at bedtime.   [DISCONTINUED] phenytoin  (DILANTIN ) 200 MG ER capsule Take 1 capsule (200 mg total) by mouth daily. Take with 1 capsule 30 mg dose for total daily dose 230 mg   [DISCONTINUED] phenytoin  (DILANTIN ) 30 MG ER capsule Take 1 capsule (30 mg total) by mouth daily. Take with 1 capsule 200 mg dose for total daily dose 230 mg   [Paused] isosorbide  mononitrate (IMDUR ) 30 MG 24 hr tablet TAKE 1 TABLET EVERY DAY   [Paused] metoprolol  succinate (TOPROL -XL) 25 MG 24 hr tablet Take 0.5 tablets (12.5 mg total) by mouth daily.   No facility-administered medications prior to visit.    Review of Systems  Constitutional: Negative.   HENT: Negative.    Eyes: Negative.   Respiratory: Negative.    Gastrointestinal: Negative.   Genitourinary:  Positive for urgency.   Musculoskeletal: Negative.   Skin: Negative.   Neurological:  Positive for speech change and focal weakness.  Endo/Heme/Allergies: Negative.   Psychiatric/Behavioral: Negative.    All other systems reviewed and are negative.      Objective:   BP 110/70   Pulse 80   Temp 97.8 F (36.6 C)   Ht 5' 4 (1.626 m)   Wt 195 lb 6.4 oz (88.6 kg)   SpO2 97%   BMI 33.54 kg/m   Vitals:   04/13/24 1528  BP: 110/70  Pulse:  80  Temp: 97.8 F (36.6 C)  Height: 5' 4 (1.626 m)  Weight: 195 lb 6.4 oz (88.6 kg)  SpO2: 97%  BMI (Calculated): 33.52    Physical Exam Vitals reviewed.  Constitutional:      Appearance: Normal appearance. He is normal weight.  HENT:     Head: Normocephalic.     Nose: Nose normal.     Mouth/Throat:     Mouth: Mucous membranes are moist.  Eyes:     Pupils: Pupils are equal, round, and reactive to light.  Cardiovascular:     Rate and Rhythm: Normal rate and regular rhythm.     Pulses: Normal pulses.     Heart sounds: Normal heart sounds.  Pulmonary:     Effort: Pulmonary effort is normal.  Abdominal:     General: Abdomen is flat. Bowel sounds are normal.  Musculoskeletal:        General: Normal range of motion.     Cervical back: Normal range of motion.  Skin:    General: Skin is warm.  Neurological:     General: No focal deficit present.     Mental Status: He is alert.     Motor: Weakness (right arm 4/5) present.     Deep Tendon Reflexes:     Reflex Scores:      Tricep reflexes are 1+ on the right side and 1+ on the left side.      Bicep reflexes are 1+ on the right side and 1+ on the left side.      Brachioradialis reflexes are 1+ on the right side and 1+ on the left side.      Patellar reflexes are 1+ on the right side and 1+ on the left side. Psychiatric:        Mood and Affect: Mood normal.      Results for orders placed or performed in visit on 04/13/24  POCT CBG (Fasting - Glucose)  Result Value Ref Range   Glucose Fasting, POC  99 70 - 99 mg/dL  POC CREATINE & ALBUMIN,URINE  Result Value Ref Range   Microalbumin Ur, POC 30 mg/L   Creatinine, POC 50 mg/dL   Albumin/Creatinine Ratio, Urine, POC 30-300   POCT Urinalysis Dipstick (81002)  Result Value Ref Range   Color, UA     Clarity, UA     Glucose, UA Negative Negative   Bilirubin, UA Negative    Ketones, UA Negative    Spec Grav, UA 1.015 1.010 - 1.025   Blood, UA Negative    pH, UA 6.0 5.0 - 8.0   Protein, UA Negative Negative   Urobilinogen, UA 0.2 0.2 or 1.0 E.U./dL   Nitrite, UA Positive    Leukocytes, UA Large (3+) (A) Negative   Appearance     Odor      Recent Results (from the past 2160 hours)  POCT CBG (Fasting - Glucose)     Status: Abnormal   Collection Time: 01/16/24  3:22 PM  Result Value Ref Range   Glucose Fasting, POC 105 (A) 70 - 99 mg/dL  Comprehensive metabolic panel with GFR     Status: Abnormal   Collection Time: 01/27/24  8:31 AM  Result Value Ref Range   Glucose 82 70 - 99 mg/dL   BUN 12 8 - 27 mg/dL   Creatinine, Ser 9.32 (L) 0.76 - 1.27 mg/dL   eGFR 99 >40 fO/fpw/8.26   BUN/Creatinine Ratio 18 10 - 24   Sodium 134 134 -  144 mmol/L   Potassium 4.2 3.5 - 5.2 mmol/L   Chloride 98 96 - 106 mmol/L   CO2 20 20 - 29 mmol/L   Calcium  9.2 8.6 - 10.2 mg/dL   Total Protein 6.8 6.0 - 8.5 g/dL   Albumin 4.2 3.8 - 4.8 g/dL   Globulin, Total 2.6 1.5 - 4.5 g/dL   Bilirubin Total 0.3 0.0 - 1.2 mg/dL   Alkaline Phosphatase 92 47 - 123 IU/L    Comment:               **Please note reference interval change**   AST 13 0 - 40 IU/L   ALT 12 0 - 44 IU/L  CBC With Diff/Platelet     Status: Abnormal   Collection Time: 01/27/24  8:31 AM  Result Value Ref Range   WBC 6.1 3.4 - 10.8 x10E3/uL   RBC 4.05 (L) 4.14 - 5.80 x10E6/uL   Hemoglobin 12.2 (L) 13.0 - 17.7 g/dL   Hematocrit 62.6 (L) 62.4 - 51.0 %   MCV 92 79 - 97 fL   MCH 30.1 26.6 - 33.0 pg   MCHC 32.7 31.5 - 35.7 g/dL   RDW 86.0 88.3 - 84.5 %   Platelets 190 150 - 450 x10E3/uL    Neutrophils 52 Not Estab. %   Lymphs 31 Not Estab. %   Monocytes 12 Not Estab. %   Eos 3 Not Estab. %   Basos 1 Not Estab. %   Neutrophils Absolute 3.2 1.4 - 7.0 x10E3/uL   Lymphocytes Absolute 1.9 0.7 - 3.1 x10E3/uL   Monocytes Absolute 0.8 0.1 - 0.9 x10E3/uL   EOS (ABSOLUTE) 0.2 0.0 - 0.4 x10E3/uL   Basophils Absolute 0.0 0.0 - 0.2 x10E3/uL   Immature Granulocytes 0 Not Estab. %   Immature Grans (Abs) 0.0 0.0 - 0.1 x10E3/uL  PSA     Status: None   Collection Time: 01/27/24  8:31 AM  Result Value Ref Range   Prostate Specific Ag, Serum 2.6 0.0 - 4.0 ng/mL    Comment: Roche ECLIA methodology. According to the American Urological Association, Serum PSA should decrease and remain at undetectable levels after radical prostatectomy. The AUA defines biochemical recurrence as an initial PSA value 0.2 ng/mL or greater followed by a subsequent confirmatory PSA value 0.2 ng/mL or greater. Values obtained with different assay methods or kits cannot be used interchangeably. Results cannot be interpreted as absolute evidence of the presence or absence of malignant disease.   POCT CBG (Fasting - Glucose)     Status: Normal   Collection Time: 01/31/24  1:39 PM  Result Value Ref Range   Glucose Fasting, POC 76 70 - 99 mg/dL  POCT Urinalysis Dipstick (18997)     Status: Abnormal   Collection Time: 01/31/24  2:45 PM  Result Value Ref Range   Color, UA     Clarity, UA     Glucose, UA Negative Negative   Bilirubin, UA Negative    Ketones, UA Negative    Spec Grav, UA 1.010 1.010 - 1.025   Blood, UA Negative    pH, UA 6.0 5.0 - 8.0   Protein, UA Negative Negative   Urobilinogen, UA 0.2 0.2 or 1.0 E.U./dL   Nitrite, UA Negative    Leukocytes, UA Large (3+) (A) Negative   Appearance     Odor    Comprehensive metabolic panel     Status: Abnormal   Collection Time: 03/11/24 10:50 AM  Result Value Ref Range   Sodium  140 135 - 145 mmol/L   Potassium 3.8 3.5 - 5.1 mmol/L   Chloride 106 98  - 111 mmol/L   CO2 21 (L) 22 - 32 mmol/L   Glucose, Bld 110 (H) 70 - 99 mg/dL    Comment: Glucose reference range applies only to samples taken after fasting for at least 8 hours.   BUN 15 8 - 23 mg/dL   Creatinine, Ser 9.23 0.61 - 1.24 mg/dL   Calcium  9.0 8.9 - 10.3 mg/dL   Total Protein 7.6 6.5 - 8.1 g/dL   Albumin 3.9 3.5 - 5.0 g/dL   AST 19 15 - 41 U/L   ALT 11 0 - 44 U/L   Alkaline Phosphatase 79 38 - 126 U/L   Total Bilirubin 0.6 0.0 - 1.2 mg/dL   GFR, Estimated >39 >39 mL/min    Comment: (NOTE) Calculated using the CKD-EPI Creatinine Equation (2021)    Anion gap 13 5 - 15    Comment: Performed at [facility], [address] [place], [place] [address]  Type and screen [facility]     Status: None   Collection Time: 03/11/24 11:46 AM  Result Value Ref Range   ABO/RH(D) O POS    Antibody Screen NEG    Sample Expiration      03/14/2024,2359 Performed at [facility], [address] [place], [place] [address]   CBC     Status: Abnormal   Collection Time: 03/11/24 11:46 AM  Result Value Ref Range   WBC 10.5 4.0 - 10.5 K/uL   RBC 4.03 (L) 4.22 - 5.81 MIL/uL   Hemoglobin 12.0 (L) 13.0 - 17.0 g/dL   HCT 63.2 (L) 60.9 - 47.9 %   MCV 91.1 80.0 - 100.0 fL   MCH 29.8 26.0 - 34.0 pg   MCHC 32.7 30.0 - 36.0 g/dL   RDW 85.0 88.4 - 84.4 %   Platelets 203 150 - 400 K/uL   nRBC 0.0 0.0 - 0.2 %    Comment: Performed at [facility], [address] [place], [place] [address]  CBC with Differential/Platelet     Status: Abnormal   Collection Time: 03/11/24 11:46 AM  Result Value Ref Range   WBC 10.4 4.0 - 10.5 K/uL   RBC 4.00 (L) 4.22 - 5.81 MIL/uL   Hemoglobin 11.9 (L) 13.0 - 17.0 g/dL   HCT 63.1 (L) 60.9 - 47.9 %   MCV 92.0 80.0 - 100.0 fL   MCH 29.8 26.0 - 34.0 pg   MCHC 32.3 30.0 - 36.0 g/dL   RDW 84.9 88.4 - 84.4 %   Platelets 212 150 - 400 K/uL   nRBC 0.0 0.0 - 0.2 %   Neutrophils Relative % 75 %   Neutro Abs 7.9 (H)  1.7 - 7.7 K/uL   Lymphocytes Relative 14 %   Lymphs Abs 1.4 0.7 - 4.0 K/uL   Monocytes Relative 9 %   Monocytes Absolute 1.0 0.1 - 1.0 K/uL   Eosinophils Relative 1 %   Eosinophils Absolute 0.1 0.0 - 0.5 K/uL   Basophils Relative 0 %   Basophils Absolute 0.0 0.0 - 0.1 K/uL   Immature Granulocytes 1 %   Abs Immature Granulocytes 0.05 0.00 - 0.07 K/uL    Comment: Performed at [facility], [address] [place], [place] [address]  Troponin I (High Sensitivity)     Status: None   Collection Time: 03/11/24 11:47 AM  Result Value Ref Range  Troponin I (High Sensitivity) 4 <18 ng/L    Comment: (NOTE) Elevated high sensitivity troponin I (hsTnI) values and significant  changes across serial measurements may suggest ACS but many other  chronic and acute conditions are known to elevate hsTnI results.  Refer to the Links section for chest pain algorithms and additional  guidance. Performed at [facility], [address] [place], [place] [address]   Procalcitonin     Status: None   Collection Time: 03/11/24 11:47 AM  Result Value Ref Range   Procalcitonin <0.10 ng/mL    Comment:        Interpretation: PCT (Procalcitonin) <= 0.5 ng/mL: Systemic infection (sepsis) is not likely. Local bacterial infection is possible. (NOTE)       Sepsis PCT Algorithm           Lower Respiratory Tract                                      Infection PCT Algorithm    ----------------------------     ----------------------------         PCT < 0.25 ng/mL                PCT < 0.10 ng/mL          Strongly encourage             Strongly discourage   discontinuation of antibiotics    initiation of antibiotics    ----------------------------     -----------------------------       PCT 0.25 - 0.50 ng/mL            PCT 0.10 - 0.25 ng/mL               OR       >80% decrease in PCT            Discourage initiation of                                            antibiotics      Encourage  discontinuation           of antibiotics    ----------------------------     -----------------------------         PCT >= 0.50 ng/mL              PCT 0.26 - 0.50 ng/mL               AND        <80% decrease in PCT             Encourage initiation of                                             antibiotics       Encourage continuation           of antibiotics    ----------------------------     -----------------------------        PCT >= 0.50 ng/mL                  PCT > 0.50 ng/mL  AND         increase in PCT                  Strongly encourage                                      initiation of antibiotics    Strongly encourage escalation           of antibiotics                                     -----------------------------                                           PCT <= 0.25 ng/mL                                                 OR                                        > 80% decrease in PCT                                      Discontinue / Do not initiate                                             antibiotics  Performed at [facility], [address] [place], [place] [address]   Urinalysis, w/ Reflex to Culture (Infection Suspected) -Urine, Clean Catch     Status: Abnormal   Collection Time: 03/11/24  3:21 PM  Result Value Ref Range   Specimen Source URINE, CLEAN CATCH    Color, Urine YELLOW YELLOW    Comment: BIOCHEMICALS MAY BE AFFECTED BY COLOR   APPearance CLOUDY (A) CLEAR   Specific Gravity, Urine >1.046 (H) 1.005 - 1.030   pH 7.0 5.0 - 8.0   Glucose, UA NEGATIVE NEGATIVE mg/dL   Hgb urine dipstick NEGATIVE NEGATIVE   Bilirubin Urine NEGATIVE NEGATIVE   Ketones, ur 20 (A) NEGATIVE mg/dL   Protein, ur NEGATIVE NEGATIVE mg/dL   Nitrite NEGATIVE NEGATIVE   Leukocytes,Ua MODERATE (A) NEGATIVE   RBC / HPF 6-10 0 - 5 RBC/hpf   WBC, UA >50 0 - 5 WBC/hpf    Comment:        Reflex urine culture not performed if WBC <=10, OR if Squamous  epithelial cells >5. If Squamous epithelial cells >5 suggest recollection.    Bacteria, UA MANY (A) NONE SEEN   Squamous Epithelial / HPF 0 0 - 5 /HPF   Mucus PRESENT     Comment: Performed at [facility], [address], [place], [place] [address]  Urine Culture     Status: Abnormal   Collection Time: 03/11/24  3:21 PM  Specimen: Urine, Random  Result Value Ref Range   Specimen Description      URINE, RANDOM Performed at [facility], [address] [place], [place] [address]    Special Requests      NONE Reflexed from [id number] Performed at [facility], [address] [place], [place] [address]    Culture (A)     >=100,000 COLONIES/mL ESCHERICHIA COLI 20,000 COLONIES/mL KLEBSIELLA PNEUMONIAE    Report Status 03/16/2024 FINAL    Organism ID, Bacteria ESCHERICHIA COLI (A)    Organism ID, Bacteria KLEBSIELLA PNEUMONIAE (A)       Susceptibility   Escherichia coli - MIC*    AMPICILLIN 16 INTERMEDIATE Intermediate     CEFAZOLIN  (URINE) Value in next row Sensitive      2 SENSITIVEThis is a modified FDA-approved test that has been validated and its performance characteristics determined by the reporting laboratory.  This laboratory is certified under the Clinical Laboratory Improvement Amendments CLIA as qualified to perform high complexity clinical laboratory testing.    CEFEPIME  Value in next row Sensitive      2 SENSITIVEThis is a modified FDA-approved test that has been validated and its performance characteristics determined by the reporting laboratory.  This laboratory is certified under the Clinical Laboratory Improvement Amendments CLIA as qualified to perform high complexity clinical laboratory testing.    ERTAPENEM Value in next row Sensitive      2 SENSITIVEThis is a modified FDA-approved test that has been validated and its performance characteristics determined by the reporting laboratory.  This laboratory is certified under the Clinical  Laboratory Improvement Amendments CLIA as qualified to perform high complexity clinical laboratory testing.    CEFTRIAXONE  Value in next row Sensitive      2 SENSITIVEThis is a modified FDA-approved test that has been validated and its performance characteristics determined by the reporting laboratory.  This laboratory is certified under the Clinical Laboratory Improvement Amendments CLIA as qualified to perform high complexity clinical laboratory testing.    CIPROFLOXACIN  Value in next row Sensitive      2 SENSITIVEThis is a modified FDA-approved test that has been validated and its performance characteristics determined by the reporting laboratory.  This laboratory is certified under the Clinical Laboratory Improvement Amendments CLIA as qualified to perform high complexity clinical laboratory testing.    GENTAMICIN Value in next row Sensitive      2 SENSITIVEThis is a modified FDA-approved test that has been validated and its performance characteristics determined by the reporting laboratory.  This laboratory is certified under the Clinical Laboratory Improvement Amendments CLIA as qualified to perform high complexity clinical laboratory testing.    NITROFURANTOIN  Value in next row Sensitive      2 SENSITIVEThis is a modified FDA-approved test that has been validated and its performance characteristics determined by the reporting laboratory.  This laboratory is certified under the Clinical Laboratory Improvement Amendments CLIA as qualified to perform high complexity clinical laboratory testing.    TRIMETH /SULFA  Value in next row Sensitive      2 SENSITIVEThis is a modified FDA-approved test that has been validated and its performance characteristics determined by the reporting laboratory.  This laboratory is certified under the Clinical Laboratory Improvement Amendments CLIA as qualified to perform high complexity clinical laboratory testing.    AMPICILLIN/SULBACTAM Value in next row Sensitive      2  SENSITIVEThis is a modified FDA-approved test that has been validated and its performance characteristics determined by the reporting laboratory.  This laboratory  is certified under the Clinical Laboratory Improvement Amendments CLIA as qualified to perform high complexity clinical laboratory testing.    PIP/TAZO Value in next row Sensitive      <=4 SENSITIVEThis is a modified FDA-approved test that has been validated and its performance characteristics determined by the reporting laboratory.  This laboratory is certified under the Clinical Laboratory Improvement Amendments CLIA as qualified to perform high complexity clinical laboratory testing.    MEROPENEM  Value in next row Sensitive      <=4 SENSITIVEThis is a modified FDA-approved test that has been validated and its performance characteristics determined by the reporting laboratory.  This laboratory is certified under the Clinical Laboratory Improvement Amendments CLIA as qualified to perform high complexity clinical laboratory testing.    * >=100,000 COLONIES/mL ESCHERICHIA COLI   Klebsiella pneumoniae - MIC*    AMPICILLIN Value in next row Resistant      <=4 SENSITIVEThis is a modified FDA-approved test that has been validated and its performance characteristics determined by the reporting laboratory.  This laboratory is certified under the Clinical Laboratory Improvement Amendments CLIA as qualified to perform high complexity clinical laboratory testing.    CEFAZOLIN  (URINE) Value in next row Sensitive      <=1 SENSITIVEThis is a modified FDA-approved test that has been validated and its performance characteristics determined by the reporting laboratory.  This laboratory is certified under the Clinical Laboratory Improvement Amendments CLIA as qualified to perform high complexity clinical laboratory testing.    CEFEPIME  Value in next row Sensitive      <=1 SENSITIVEThis is a modified FDA-approved test that has been validated and its performance  characteristics determined by the reporting laboratory.  This laboratory is certified under the Clinical Laboratory Improvement Amendments CLIA as qualified to perform high complexity clinical laboratory testing.    ERTAPENEM Value in next row Sensitive      <=1 SENSITIVEThis is a modified FDA-approved test that has been validated and its performance characteristics determined by the reporting laboratory.  This laboratory is certified under the Clinical Laboratory Improvement Amendments CLIA as qualified to perform high complexity clinical laboratory testing.    CEFTRIAXONE  Value in next row Sensitive      <=1 SENSITIVEThis is a modified FDA-approved test that has been validated and its performance characteristics determined by the reporting laboratory.  This laboratory is certified under the Clinical Laboratory Improvement Amendments CLIA as qualified to perform high complexity clinical laboratory testing.    CIPROFLOXACIN  Value in next row Sensitive      <=1 SENSITIVEThis is a modified FDA-approved test that has been validated and its performance characteristics determined by the reporting laboratory.  This laboratory is certified under the Clinical Laboratory Improvement Amendments CLIA as qualified to perform high complexity clinical laboratory testing.    GENTAMICIN Value in next row Sensitive      <=1 SENSITIVEThis is a modified FDA-approved test that has been validated and its performance characteristics determined by the reporting laboratory.  This laboratory is certified under the Clinical Laboratory Improvement Amendments CLIA as qualified to perform high complexity clinical laboratory testing.    NITROFURANTOIN  Value in next row Intermediate      <=1 SENSITIVEThis is a modified FDA-approved test that has been validated and its performance characteristics determined by the reporting laboratory.  This laboratory is certified under the Clinical Laboratory Improvement Amendments CLIA as qualified to  perform high complexity clinical laboratory testing.    TRIMETH /SULFA  Value in next row Sensitive      <=1  SENSITIVEThis is a modified FDA-approved test that has been validated and its performance characteristics determined by the reporting laboratory.  This laboratory is certified under the Clinical Laboratory Improvement Amendments CLIA as qualified to perform high complexity clinical laboratory testing.    AMPICILLIN/SULBACTAM Value in next row Sensitive      <=1 SENSITIVEThis is a modified FDA-approved test that has been validated and its performance characteristics determined by the reporting laboratory.  This laboratory is certified under the Clinical Laboratory Improvement Amendments CLIA as qualified to perform high complexity clinical laboratory testing.    PIP/TAZO Value in next row Sensitive      <=4 SENSITIVEThis is a modified FDA-approved test that has been validated and its performance characteristics determined by the reporting laboratory.  This laboratory is certified under the Clinical Laboratory Improvement Amendments CLIA as qualified to perform high complexity clinical laboratory testing.    MEROPENEM  Value in next row Sensitive      <=4 SENSITIVEThis is a modified FDA-approved test that has been validated and its performance characteristics determined by the reporting laboratory.  This laboratory is certified under the Clinical Laboratory Improvement Amendments CLIA as qualified to perform high complexity clinical laboratory testing.    * 20,000 COLONIES/mL KLEBSIELLA PNEUMONIAE  MRSA Next Gen by PCR, Nasal     Status: None   Collection Time: 03/11/24  5:28 PM   Specimen: Nasal Mucosa; Nasal Swab  Result Value Ref Range   MRSA by PCR Next Gen NOT DETECTED NOT DETECTED    Comment: (NOTE) The GeneXpert MRSA Assay (FDA approved for NASAL specimens only), is one component of a comprehensive MRSA colonization surveillance program. It is not intended to diagnose MRSA infection nor to  guide or monitor treatment for MRSA infections. Test performance is not FDA approved in patients less than 56 years old. Performed at [facility], [address] [place], [place] [address]   Resp panel by RT-PCR (RSV, Flu A&B, Covid) Anterior Nasal Swab     Status: None   Collection Time: 03/11/24  5:28 PM   Specimen: Anterior Nasal Swab  Result Value Ref Range   SARS Coronavirus 2 by RT PCR NEGATIVE NEGATIVE    Comment: (NOTE) SARS-CoV-2 target nucleic acids are NOT DETECTED.  The SARS-CoV-2 RNA is generally detectable in upper respiratory specimens during the acute phase of infection. The lowest concentration of SARS-CoV-2 viral copies this assay can detect is 138 copies/mL. A negative result does not preclude SARS-Cov-2 infection and should not be used as the sole basis for treatment or other patient management decisions. A negative result may occur with  improper specimen collection/handling, submission of specimen other than nasopharyngeal swab, presence of viral mutation(Chaise Passarella) within the areas targeted by this assay, and inadequate number of viral copies(<138 copies/mL). A negative result must be combined with clinical observations, patient history, and epidemiological information. The expected result is Negative.  Fact Sheet for Patients:  bloggercourse.com  Fact Sheet for Healthcare Providers:  seriousbroker.it  This test is no t yet approved or cleared by the [place]  FDA and  has been authorized for detection and/or diagnosis of SARS-CoV-2 by FDA under an Emergency Use Authorization (EUA). This EUA will remain  in effect (meaning this test can be used) for the duration of the COVID-19 declaration under Section 564(b)(1) of the Act, 21 U.Krayton Wortley.C.section 360bbb-3(b)(1), unless the authorization is terminated  or revoked sooner.       Influenza A by PCR NEGATIVE NEGATIVE   Influenza B by PCR NEGATIVE  NEGATIVE    Comment: (NOTE) The Xpert Xpress SARS-CoV-2/FLU/RSV plus assay is intended as an aid in the diagnosis of influenza from Nasopharyngeal swab specimens and should not be used as a sole basis for treatment. Nasal washings and aspirates are unacceptable for Xpert Xpress SARS-CoV-2/FLU/RSV testing.  Fact Sheet for Patients: bloggercourse.com  Fact Sheet for Healthcare Providers: seriousbroker.it  This test is not yet approved or cleared by the [place]  FDA and has been authorized for detection and/or diagnosis of SARS-CoV-2 by FDA under an Emergency Use Authorization (EUA). This EUA will remain in effect (meaning this test can be used) for the duration of the COVID-19 declaration under Section 564(b)(1) of the Act, 21 U.Aleece Loyd.C. section 360bbb-3(b)(1), unless the authorization is terminated or revoked.     Resp Syncytial Virus by PCR NEGATIVE NEGATIVE    Comment: (NOTE) Fact Sheet for Patients: bloggercourse.com  Fact Sheet for Healthcare Providers: seriousbroker.it  This test is not yet approved or cleared by the [place]  FDA and has been authorized for detection and/or diagnosis of SARS-CoV-2 by FDA under an Emergency Use Authorization (EUA). This EUA will remain in effect (meaning this test can be used) for the duration of the COVID-19 declaration under Section 564(b)(1) of the Act, 21 U.Cosette Prindle.C. section 360bbb-3(b)(1), unless the authorization is terminated or revoked.  Performed at [facility], [address] [place], [place] [address]   Magnesium      Status: None   Collection Time: 03/12/24  5:26 AM  Result Value Ref Range   Magnesium  1.9 1.7 - 2.4 mg/dL    Comment: Performed at [facility], [address] [place], [place] [address]  CBC with Differential/Platelet     Status: Abnormal   Collection Time: 03/12/24  5:26 AM  Result  Value Ref Range   WBC 6.6 4.0 - 10.5 K/uL   RBC 3.66 (L) 4.22 - 5.81 MIL/uL   Hemoglobin 10.6 (L) 13.0 - 17.0 g/dL   HCT 66.7 (L) 60.9 - 47.9 %   MCV 90.7 80.0 - 100.0 fL   MCH 29.0 26.0 - 34.0 pg   MCHC 31.9 30.0 - 36.0 g/dL   RDW 84.8 88.4 - 84.4 %   Platelets 189 150 - 400 K/uL   nRBC 0.0 0.0 - 0.2 %   Neutrophils Relative % 59 %   Neutro Abs 4.0 1.7 - 7.7 K/uL   Lymphocytes Relative 26 %   Lymphs Abs 1.7 0.7 - 4.0 K/uL   Monocytes Relative 11 %   Monocytes Absolute 0.7 0.1 - 1.0 K/uL   Eosinophils Relative 3 %   Eosinophils Absolute 0.2 0.0 - 0.5 K/uL   Basophils Relative 0 %   Basophils Absolute 0.0 0.0 - 0.1 K/uL   Immature Granulocytes 1 %   Abs Immature Granulocytes 0.04 0.00 - 0.07 K/uL    Comment: Performed at [facility], [address] [place], [place] [address]  Comprehensive metabolic panel with GFR     Status: Abnormal   Collection Time: 03/12/24  5:26 AM  Result Value Ref Range   Sodium 141 135 - 145 mmol/L   Potassium 3.3 (L) 3.5 - 5.1 mmol/L   Chloride 107 98 - 111 mmol/L   CO2 23 22 - 32 mmol/L   Glucose, Bld 88 70 - 99 mg/dL    Comment: Glucose reference range applies only to samples taken after fasting for at least 8 hours.   BUN 13 8 - 23 mg/dL   Creatinine, Ser 9.40 (L) 0.61 -  1.24 mg/dL   Calcium  8.7 (L) 8.9 - 10.3 mg/dL   Total Protein 7.1 6.5 - 8.1 g/dL   Albumin 3.5 3.5 - 5.0 g/dL   AST 11 (L) 15 - 41 U/L   ALT 8 0 - 44 U/L   Alkaline Phosphatase 68 38 - 126 U/L   Total Bilirubin 1.0 0.0 - 1.2 mg/dL   GFR, Estimated >39 >39 mL/min    Comment: (NOTE) Calculated using the CKD-EPI Creatinine Equation (2021)    Anion gap 11 5 - 15    Comment: Performed at [facility], [address] [place], [place] [address]  Phosphorus     Status: None   Collection Time: 03/12/24  5:26 AM  Result Value Ref Range   Phosphorus 3.4 2.5 - 4.6 mg/dL    Comment: Performed at [facility], [address] [place], [place]  [address]  Magnesium      Status: None   Collection Time: 03/13/24  5:34 AM  Result Value Ref Range   Magnesium  1.9 1.7 - 2.4 mg/dL    Comment: Performed at [facility], [address] [place], [place] [address]  CBC with Differential/Platelet     Status: Abnormal   Collection Time: 03/13/24  5:34 AM  Result Value Ref Range   WBC 7.1 4.0 - 10.5 K/uL   RBC 3.72 (L) 4.22 - 5.81 MIL/uL   Hemoglobin 10.9 (L) 13.0 - 17.0 g/dL   HCT 65.8 (L) 60.9 - 47.9 %   MCV 91.7 80.0 - 100.0 fL   MCH 29.3 26.0 - 34.0 pg   MCHC 32.0 30.0 - 36.0 g/dL   RDW 84.7 88.4 - 84.4 %   Platelets 180 150 - 400 K/uL   nRBC 0.0 0.0 - 0.2 %   Neutrophils Relative % 62 %   Neutro Abs 4.4 1.7 - 7.7 K/uL   Lymphocytes Relative 24 %   Lymphs Abs 1.7 0.7 - 4.0 K/uL   Monocytes Relative 11 %   Monocytes Absolute 0.8 0.1 - 1.0 K/uL   Eosinophils Relative 3 %   Eosinophils Absolute 0.2 0.0 - 0.5 K/uL   Basophils Relative 0 %   Basophils Absolute 0.0 0.0 - 0.1 K/uL   Immature Granulocytes 0 %   Abs Immature Granulocytes 0.03 0.00 - 0.07 K/uL    Comment: Performed at [facility], [address] [place], [place] [address]  Comprehensive metabolic panel with GFR     Status: Abnormal   Collection Time: 03/13/24  5:34 AM  Result Value Ref Range   Sodium 141 135 - 145 mmol/L   Potassium 3.3 (L) 3.5 - 5.1 mmol/L   Chloride 107 98 - 111 mmol/L   CO2 23 22 - 32 mmol/L   Glucose, Bld 95 70 - 99 mg/dL    Comment: Glucose reference range applies only to samples taken after fasting for at least 8 hours.   BUN 18 8 - 23 mg/dL   Creatinine, Ser 9.42 (L) 0.61 - 1.24 mg/dL   Calcium  8.7 (L) 8.9 - 10.3 mg/dL   Total Protein 6.6 6.5 - 8.1 g/dL   Albumin 3.1 (L) 3.5 - 5.0 g/dL   AST 11 (L) 15 - 41 U/L   ALT 8 0 - 44 U/L   Alkaline Phosphatase 60 38 - 126 U/L   Total Bilirubin 0.8 0.0 - 1.2 mg/dL   GFR, Estimated >39 >39 mL/min    Comment: (NOTE) Calculated using the CKD-EPI Creatinine Equation (2021)  Anion gap 11 5 - 15    Comment: Performed at [facility], [address] [place], [place] [address]  Phosphorus     Status: None   Collection Time: 03/13/24  5:34 AM  Result Value Ref Range   Phosphorus 3.0 2.5 - 4.6 mg/dL    Comment: Performed at [facility], [address] [place], [place] [address]  CBC with Differential/Platelet     Status: Abnormal   Collection Time: 04/11/24  8:46 AM  Result Value Ref Range   WBC 6.4 3.4 - 10.8 x10E3/uL   RBC 3.82 (L) 4.14 - 5.80 x10E6/uL   Hemoglobin 11.4 (L) 13.0 - 17.7 g/dL   Hematocrit 64.0 (L) 62.4 - 51.0 %   MCV 94 79 - 97 fL   MCH 29.8 26.6 - 33.0 pg   MCHC 31.8 31.5 - 35.7 g/dL   RDW 85.9 88.3 - 84.5 %   Platelets 176 150 - 450 x10E3/uL   Neutrophils 55 Not Estab. %   Lymphs 32 Not Estab. %   Monocytes 9 Not Estab. %   Eos 4 Not Estab. %   Basos 0 Not Estab. %   Neutrophils Absolute 3.4 1.4 - 7.0 x10E3/uL   Lymphocytes Absolute 2.1 0.7 - 3.1 x10E3/uL   Monocytes Absolute 0.6 0.1 - 0.9 x10E3/uL   EOS (ABSOLUTE) 0.3 0.0 - 0.4 x10E3/uL   Basophils Absolute 0.0 0.0 - 0.2 x10E3/uL   Immature Granulocytes 0 Not Estab. %   Immature Grans (Abs) 0.0 0.0 - 0.1 x10E3/uL  CMP14+EGFR     Status: Abnormal   Collection Time: 04/11/24  8:46 AM  Result Value Ref Range   Glucose 90 70 - 99 mg/dL   BUN 14 8 - 27 mg/dL   Creatinine, Ser 9.37 (L) 0.76 - 1.27 mg/dL   eGFR 898 >40 fO/fpw/8.26   BUN/Creatinine Ratio 23 10 - 24   Sodium 143 134 - 144 mmol/L   Potassium 4.0 3.5 - 5.2 mmol/L   Chloride 106 96 - 106 mmol/L   CO2 24 20 - 29 mmol/L   Calcium  9.0 8.6 - 10.2 mg/dL   Total Protein 6.7 6.0 - 8.5 g/dL   Albumin 4.0 3.8 - 4.8 g/dL   Globulin, Total 2.7 1.5 - 4.5 g/dL   Bilirubin Total 0.2 0.0 - 1.2 mg/dL   Alkaline Phosphatase 98 47 - 123 IU/L   AST 16 0 - 40 IU/L   ALT 13 0 - 44 IU/L  Hemoglobin A1c     Status: None   Collection Time: 04/11/24  8:46 AM  Result Value Ref Range   Hgb A1c MFr Bld 5.5 4.8 - 5.6 %     Comment:          Prediabetes: 5.7 - 6.4          Diabetes: >6.4          Glycemic control for adults with diabetes: <7.0    Est. average glucose Bld gHb Est-mCnc 111 mg/dL  Lipid panel     Status: None   Collection Time: 04/11/24  8:46 AM  Result Value Ref Range   Cholesterol, Total 138 100 - 199 mg/dL   Triglycerides 58 0 - 149 mg/dL   HDL 54 >60 mg/dL   VLDL Cholesterol Cal 12 5 - 40 mg/dL   LDL Chol Calc (NIH) 72 0 - 99 mg/dL   Chol/HDL Ratio 2.6 0.0 - 5.0 ratio    Comment:  T. Chol/HDL Ratio                                             Men  Women                               1/2 Avg.Risk  3.4    3.3                                   Avg.Risk  5.0    4.4                                2X Avg.Risk  9.6    7.1                                3X Avg.Risk 23.4   11.0   POCT CBG (Fasting - Glucose)     Status: Normal   Collection Time: 04/13/24  3:35 PM  Result Value Ref Range   Glucose Fasting, POC 99 70 - 99 mg/dL  POC CREATINE & ALBUMIN,URINE     Status: Abnormal   Collection Time: 04/13/24  4:10 PM  Result Value Ref Range   Microalbumin Ur, POC 30 mg/L   Creatinine, POC 50 mg/dL   Albumin/Creatinine Ratio, Urine, POC 30-300   POCT Urinalysis Dipstick (18997)     Status: Abnormal   Collection Time: 04/13/24  4:11 PM  Result Value Ref Range   Color, UA     Clarity, UA     Glucose, UA Negative Negative   Bilirubin, UA Negative    Ketones, UA Negative    Spec Grav, UA 1.015 1.010 - 1.025   Blood, UA Negative    pH, UA 6.0 5.0 - 8.0   Protein, UA Negative Negative   Urobilinogen, UA 0.2 0.2 or 1.0 E.U./dL   Nitrite, UA Positive    Leukocytes, UA Large (3+) (A) Negative   Appearance     Odor        Assessment & Plan:  Wyat was seen today for follow-up.  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 Take with 1 capsule 200 mg dose for total daily dose 230 mg  Dispense: 90 capsule; Refill: 1 -     Phenytoin  Sodium Extended; Take 2 capsules (200 mg total) by mouth daily.  Dispense: 180 capsule; Refill: 1  Benign prostatic hyperplasia with urinary frequency -     Lipid panel -     Urine Culture -     POCT urinalysis dipstick  Diabetes mellitus without complication (HCC) -     Hemoglobin A1c  History of stroke  Problem List Items Addressed This Visit       Endocrine   DM (diabetes mellitus) (HCC) - Primary   Relevant Orders   POCT CBG (Fasting - Glucose) (Completed)   POC CREATINE & ALBUMIN,URINE (Completed)     Nervous and Auditory   Seizure disorder (HCC)   Relevant Medications   phenytoin  (DILANTIN ) 30 MG ER capsule   phenytoin  (DILANTIN ) 100 MG ER capsule     Other   History of stroke   Other Visit Diagnoses       Benign prostatic hyperplasia with urinary frequency       Relevant Orders   Urine Culture   POCT Urinalysis Dipstick (18997) (Completed)     Diabetes mellitus without complication (HCC)           Return in about 3 months (around 07/12/2024) for fu with labs prior.   Total time spent: 20 minutes. This time includes review of previous notes and results and patient face to face interaction during today'Copeland Lapier visit.    Sherrill Cinderella Perry, MD  04/13/2024   This document may have been prepared by [facility] Voice Recognition software and as such may include unintentional dictation errors.

## 2024-04-16 NOTE — Progress Notes (Signed)
 Urine culture was sent friday

## 2024-04-17 ENCOUNTER — Other Ambulatory Visit: Payer: Self-pay

## 2024-04-17 LAB — URINE CULTURE

## 2024-04-17 MED ORDER — NITROFURANTOIN MONOHYD MACRO 100 MG PO CAPS: 100.0000 mg | ORAL_CAPSULE | Freq: Two times a day (BID) | ORAL | 0 refills | Status: AC

## 2024-04-17 MED ORDER — KERENDIA 10 MG PO TABS: 10.0000 mg | ORAL_TABLET | Freq: Every day | ORAL | 0 refills | Status: AC

## 2024-04-17 NOTE — Progress Notes (Signed)
 Patient daughter informed and will come get samples of the kerendia  and both rx were sent in

## 2024-06-01 ENCOUNTER — Ambulatory Visit: Admitting: Cardiovascular Disease

## 2024-06-08 ENCOUNTER — Encounter: Payer: Self-pay | Admitting: Cardiovascular Disease

## 2024-06-08 ENCOUNTER — Ambulatory Visit: Admitting: Cardiovascular Disease

## 2024-06-08 VITALS — BP 110/78 | HR 78 | Ht 64.0 in | Wt 191.0 lb

## 2024-06-08 DIAGNOSIS — E1165 Type 2 diabetes mellitus with hyperglycemia: Secondary | ICD-10-CM

## 2024-06-08 DIAGNOSIS — E782 Mixed hyperlipidemia: Secondary | ICD-10-CM | POA: Diagnosis not present

## 2024-06-08 DIAGNOSIS — I2510 Atherosclerotic heart disease of native coronary artery without angina pectoris: Secondary | ICD-10-CM | POA: Diagnosis not present

## 2024-06-08 DIAGNOSIS — I10 Essential (primary) hypertension: Secondary | ICD-10-CM

## 2024-06-08 DIAGNOSIS — I6522 Occlusion and stenosis of left carotid artery: Secondary | ICD-10-CM

## 2024-06-08 DIAGNOSIS — R55 Syncope and collapse: Secondary | ICD-10-CM | POA: Diagnosis not present

## 2024-06-08 DIAGNOSIS — Z8673 Personal history of transient ischemic attack (TIA), and cerebral infarction without residual deficits: Secondary | ICD-10-CM | POA: Diagnosis not present

## 2024-06-08 DIAGNOSIS — I252 Old myocardial infarction: Secondary | ICD-10-CM

## 2024-06-08 DIAGNOSIS — Z955 Presence of coronary angioplasty implant and graft: Secondary | ICD-10-CM

## 2024-06-08 DIAGNOSIS — E119 Type 2 diabetes mellitus without complications: Secondary | ICD-10-CM

## 2024-07-13 ENCOUNTER — Ambulatory Visit: Admitting: Internal Medicine

## 2024-09-07 ENCOUNTER — Ambulatory Visit: Admitting: Cardiovascular Disease

## 2025-02-22 ENCOUNTER — Ambulatory Visit: Admitting: Physician Assistant
